# Patient Record
Sex: Male | Born: 1940
Health system: Southern US, Community
[De-identification: ages and names within clinical notes are randomized; demographics above are authoritative.]

## PROBLEM LIST (undated history)

## (undated) DIAGNOSIS — R0602 Shortness of breath: Secondary | ICD-10-CM

## (undated) DIAGNOSIS — J189 Pneumonia, unspecified organism: Secondary | ICD-10-CM

## (undated) DIAGNOSIS — C801 Malignant (primary) neoplasm, unspecified: Secondary | ICD-10-CM

## (undated) DIAGNOSIS — I2510 Atherosclerotic heart disease of native coronary artery without angina pectoris: Secondary | ICD-10-CM

## (undated) DIAGNOSIS — I10 Essential (primary) hypertension: Secondary | ICD-10-CM

## (undated) DIAGNOSIS — K219 Gastro-esophageal reflux disease without esophagitis: Secondary | ICD-10-CM

## (undated) DIAGNOSIS — N189 Chronic kidney disease, unspecified: Secondary | ICD-10-CM

## (undated) DIAGNOSIS — I509 Heart failure, unspecified: Secondary | ICD-10-CM

## (undated) DIAGNOSIS — M1990 Unspecified osteoarthritis, unspecified site: Secondary | ICD-10-CM

## (undated) DIAGNOSIS — D649 Anemia, unspecified: Secondary | ICD-10-CM

## (undated) DIAGNOSIS — R630 Anorexia: Secondary | ICD-10-CM

## (undated) DIAGNOSIS — E119 Type 2 diabetes mellitus without complications: Secondary | ICD-10-CM

## (undated) DIAGNOSIS — F0390 Unspecified dementia without behavioral disturbance: Secondary | ICD-10-CM

## (undated) DIAGNOSIS — R1310 Dysphagia, unspecified: Secondary | ICD-10-CM

## (undated) HISTORY — PX: APPENDECTOMY: SHX54

## (undated) HISTORY — DX: Type 2 diabetes mellitus without complications: E11.9

## (undated) HISTORY — DX: Anemia, unspecified: D64.9

## (undated) HISTORY — DX: Chronic kidney disease, unspecified: N18.9

---

## 2012-05-09 DIAGNOSIS — E1129 Type 2 diabetes mellitus with other diabetic kidney complication: Secondary | ICD-10-CM | POA: Diagnosis not present

## 2012-05-09 DIAGNOSIS — Z23 Encounter for immunization: Secondary | ICD-10-CM | POA: Diagnosis not present

## 2012-05-09 DIAGNOSIS — E785 Hyperlipidemia, unspecified: Secondary | ICD-10-CM | POA: Diagnosis not present

## 2012-05-09 DIAGNOSIS — E119 Type 2 diabetes mellitus without complications: Secondary | ICD-10-CM | POA: Diagnosis not present

## 2013-01-13 DIAGNOSIS — H6120 Impacted cerumen, unspecified ear: Secondary | ICD-10-CM | POA: Diagnosis not present

## 2013-01-13 DIAGNOSIS — E1129 Type 2 diabetes mellitus with other diabetic kidney complication: Secondary | ICD-10-CM | POA: Diagnosis not present

## 2013-01-13 DIAGNOSIS — E119 Type 2 diabetes mellitus without complications: Secondary | ICD-10-CM | POA: Diagnosis not present

## 2013-01-13 DIAGNOSIS — E785 Hyperlipidemia, unspecified: Secondary | ICD-10-CM | POA: Diagnosis not present

## 2013-05-30 DIAGNOSIS — R0609 Other forms of dyspnea: Secondary | ICD-10-CM | POA: Diagnosis not present

## 2013-05-30 DIAGNOSIS — I509 Heart failure, unspecified: Secondary | ICD-10-CM | POA: Diagnosis not present

## 2013-05-30 DIAGNOSIS — I10 Essential (primary) hypertension: Secondary | ICD-10-CM | POA: Diagnosis not present

## 2013-06-01 DIAGNOSIS — I509 Heart failure, unspecified: Secondary | ICD-10-CM | POA: Diagnosis not present

## 2013-06-08 DIAGNOSIS — E785 Hyperlipidemia, unspecified: Secondary | ICD-10-CM | POA: Diagnosis not present

## 2013-06-08 DIAGNOSIS — E119 Type 2 diabetes mellitus without complications: Secondary | ICD-10-CM | POA: Diagnosis not present

## 2013-06-08 DIAGNOSIS — R0602 Shortness of breath: Secondary | ICD-10-CM | POA: Diagnosis not present

## 2013-06-08 DIAGNOSIS — I10 Essential (primary) hypertension: Secondary | ICD-10-CM | POA: Diagnosis not present

## 2013-06-08 DIAGNOSIS — N189 Chronic kidney disease, unspecified: Secondary | ICD-10-CM | POA: Diagnosis not present

## 2013-06-19 DIAGNOSIS — I509 Heart failure, unspecified: Secondary | ICD-10-CM | POA: Diagnosis not present

## 2013-07-28 DIAGNOSIS — Z79899 Other long term (current) drug therapy: Secondary | ICD-10-CM | POA: Diagnosis not present

## 2013-07-28 DIAGNOSIS — N179 Acute kidney failure, unspecified: Secondary | ICD-10-CM | POA: Diagnosis not present

## 2013-07-28 DIAGNOSIS — D649 Anemia, unspecified: Secondary | ICD-10-CM | POA: Diagnosis not present

## 2013-07-28 DIAGNOSIS — Z23 Encounter for immunization: Secondary | ICD-10-CM | POA: Diagnosis not present

## 2013-07-28 DIAGNOSIS — R079 Chest pain, unspecified: Secondary | ICD-10-CM | POA: Diagnosis not present

## 2013-07-28 DIAGNOSIS — E785 Hyperlipidemia, unspecified: Secondary | ICD-10-CM | POA: Diagnosis not present

## 2013-07-28 DIAGNOSIS — IMO0001 Reserved for inherently not codable concepts without codable children: Secondary | ICD-10-CM | POA: Diagnosis not present

## 2013-07-28 DIAGNOSIS — Z794 Long term (current) use of insulin: Secondary | ICD-10-CM | POA: Diagnosis not present

## 2013-07-28 DIAGNOSIS — I10 Essential (primary) hypertension: Secondary | ICD-10-CM | POA: Diagnosis not present

## 2013-07-29 DIAGNOSIS — I10 Essential (primary) hypertension: Secondary | ICD-10-CM | POA: Diagnosis not present

## 2013-07-29 DIAGNOSIS — R0789 Other chest pain: Secondary | ICD-10-CM | POA: Diagnosis not present

## 2013-07-29 DIAGNOSIS — R079 Chest pain, unspecified: Secondary | ICD-10-CM | POA: Diagnosis not present

## 2013-07-29 DIAGNOSIS — N179 Acute kidney failure, unspecified: Secondary | ICD-10-CM | POA: Diagnosis not present

## 2013-07-29 DIAGNOSIS — IMO0001 Reserved for inherently not codable concepts without codable children: Secondary | ICD-10-CM | POA: Diagnosis not present

## 2013-07-29 DIAGNOSIS — R9431 Abnormal electrocardiogram [ECG] [EKG]: Secondary | ICD-10-CM | POA: Diagnosis not present

## 2013-08-02 DIAGNOSIS — N19 Unspecified kidney failure: Secondary | ICD-10-CM | POA: Diagnosis not present

## 2013-08-02 DIAGNOSIS — I517 Cardiomegaly: Secondary | ICD-10-CM | POA: Diagnosis not present

## 2013-08-02 DIAGNOSIS — N179 Acute kidney failure, unspecified: Secondary | ICD-10-CM | POA: Diagnosis not present

## 2013-08-02 DIAGNOSIS — I509 Heart failure, unspecified: Secondary | ICD-10-CM | POA: Diagnosis not present

## 2013-08-03 DIAGNOSIS — I5020 Unspecified systolic (congestive) heart failure: Secondary | ICD-10-CM | POA: Diagnosis not present

## 2013-08-03 DIAGNOSIS — I5030 Unspecified diastolic (congestive) heart failure: Secondary | ICD-10-CM | POA: Diagnosis not present

## 2013-08-03 DIAGNOSIS — N184 Chronic kidney disease, stage 4 (severe): Secondary | ICD-10-CM | POA: Diagnosis not present

## 2013-08-03 DIAGNOSIS — J449 Chronic obstructive pulmonary disease, unspecified: Secondary | ICD-10-CM | POA: Diagnosis not present

## 2013-08-03 DIAGNOSIS — IMO0001 Reserved for inherently not codable concepts without codable children: Secondary | ICD-10-CM | POA: Diagnosis not present

## 2013-08-04 DIAGNOSIS — I10 Essential (primary) hypertension: Secondary | ICD-10-CM | POA: Diagnosis not present

## 2013-08-04 DIAGNOSIS — E119 Type 2 diabetes mellitus without complications: Secondary | ICD-10-CM | POA: Diagnosis not present

## 2013-08-08 DIAGNOSIS — Z1389 Encounter for screening for other disorder: Secondary | ICD-10-CM | POA: Diagnosis not present

## 2013-08-08 DIAGNOSIS — R079 Chest pain, unspecified: Secondary | ICD-10-CM | POA: Diagnosis not present

## 2013-08-08 DIAGNOSIS — R0989 Other specified symptoms and signs involving the circulatory and respiratory systems: Secondary | ICD-10-CM | POA: Diagnosis not present

## 2013-08-08 DIAGNOSIS — N19 Unspecified kidney failure: Secondary | ICD-10-CM | POA: Diagnosis not present

## 2013-08-11 DIAGNOSIS — IMO0002 Reserved for concepts with insufficient information to code with codable children: Secondary | ICD-10-CM | POA: Diagnosis not present

## 2013-08-11 DIAGNOSIS — Z6827 Body mass index (BMI) 27.0-27.9, adult: Secondary | ICD-10-CM | POA: Diagnosis not present

## 2013-08-11 DIAGNOSIS — R059 Cough, unspecified: Secondary | ICD-10-CM | POA: Diagnosis not present

## 2013-08-11 DIAGNOSIS — R05 Cough: Secondary | ICD-10-CM | POA: Diagnosis not present

## 2013-08-11 DIAGNOSIS — E1165 Type 2 diabetes mellitus with hyperglycemia: Secondary | ICD-10-CM | POA: Diagnosis not present

## 2013-08-13 DIAGNOSIS — Z8739 Personal history of other diseases of the musculoskeletal system and connective tissue: Secondary | ICD-10-CM | POA: Diagnosis not present

## 2013-08-13 DIAGNOSIS — R5381 Other malaise: Secondary | ICD-10-CM | POA: Diagnosis not present

## 2013-08-13 DIAGNOSIS — D638 Anemia in other chronic diseases classified elsewhere: Secondary | ICD-10-CM | POA: Diagnosis not present

## 2013-08-13 DIAGNOSIS — J9600 Acute respiratory failure, unspecified whether with hypoxia or hypercapnia: Secondary | ICD-10-CM | POA: Diagnosis not present

## 2013-08-13 DIAGNOSIS — K219 Gastro-esophageal reflux disease without esophagitis: Secondary | ICD-10-CM | POA: Diagnosis not present

## 2013-08-13 DIAGNOSIS — J984 Other disorders of lung: Secondary | ICD-10-CM | POA: Diagnosis not present

## 2013-08-13 DIAGNOSIS — M6281 Muscle weakness (generalized): Secondary | ICD-10-CM | POA: Diagnosis not present

## 2013-08-13 DIAGNOSIS — IMO0001 Reserved for inherently not codable concepts without codable children: Secondary | ICD-10-CM | POA: Diagnosis not present

## 2013-08-13 DIAGNOSIS — R0602 Shortness of breath: Secondary | ICD-10-CM | POA: Diagnosis not present

## 2013-08-13 DIAGNOSIS — Z23 Encounter for immunization: Secondary | ICD-10-CM | POA: Diagnosis not present

## 2013-08-13 DIAGNOSIS — D649 Anemia, unspecified: Secondary | ICD-10-CM | POA: Diagnosis present

## 2013-08-13 DIAGNOSIS — M6250 Muscle wasting and atrophy, not elsewhere classified, unspecified site: Secondary | ICD-10-CM | POA: Diagnosis not present

## 2013-08-13 DIAGNOSIS — E119 Type 2 diabetes mellitus without complications: Secondary | ICD-10-CM | POA: Diagnosis not present

## 2013-08-13 DIAGNOSIS — I509 Heart failure, unspecified: Secondary | ICD-10-CM | POA: Diagnosis not present

## 2013-08-13 DIAGNOSIS — I4891 Unspecified atrial fibrillation: Secondary | ICD-10-CM | POA: Diagnosis not present

## 2013-08-13 DIAGNOSIS — I5021 Acute systolic (congestive) heart failure: Secondary | ICD-10-CM | POA: Diagnosis not present

## 2013-08-13 DIAGNOSIS — I129 Hypertensive chronic kidney disease with stage 1 through stage 4 chronic kidney disease, or unspecified chronic kidney disease: Secondary | ICD-10-CM | POA: Diagnosis present

## 2013-08-13 DIAGNOSIS — Z7982 Long term (current) use of aspirin: Secondary | ICD-10-CM | POA: Diagnosis not present

## 2013-08-13 DIAGNOSIS — Z79899 Other long term (current) drug therapy: Secondary | ICD-10-CM | POA: Diagnosis not present

## 2013-08-13 DIAGNOSIS — N189 Chronic kidney disease, unspecified: Secondary | ICD-10-CM | POA: Diagnosis not present

## 2013-08-13 DIAGNOSIS — J189 Pneumonia, unspecified organism: Secondary | ICD-10-CM | POA: Diagnosis not present

## 2013-08-13 DIAGNOSIS — I10 Essential (primary) hypertension: Secondary | ICD-10-CM | POA: Diagnosis not present

## 2013-08-13 DIAGNOSIS — E7800 Pure hypercholesterolemia, unspecified: Secondary | ICD-10-CM | POA: Diagnosis not present

## 2013-08-13 DIAGNOSIS — N19 Unspecified kidney failure: Secondary | ICD-10-CM | POA: Diagnosis not present

## 2013-08-13 DIAGNOSIS — N179 Acute kidney failure, unspecified: Secondary | ICD-10-CM | POA: Diagnosis present

## 2013-08-13 DIAGNOSIS — R262 Difficulty in walking, not elsewhere classified: Secondary | ICD-10-CM | POA: Diagnosis not present

## 2013-08-13 DIAGNOSIS — R0902 Hypoxemia: Secondary | ICD-10-CM | POA: Diagnosis not present

## 2013-08-13 DIAGNOSIS — Z794 Long term (current) use of insulin: Secondary | ICD-10-CM | POA: Diagnosis not present

## 2013-08-13 DIAGNOSIS — I5023 Acute on chronic systolic (congestive) heart failure: Secondary | ICD-10-CM | POA: Diagnosis not present

## 2013-08-17 DIAGNOSIS — I129 Hypertensive chronic kidney disease with stage 1 through stage 4 chronic kidney disease, or unspecified chronic kidney disease: Secondary | ICD-10-CM | POA: Diagnosis not present

## 2013-08-17 DIAGNOSIS — N179 Acute kidney failure, unspecified: Secondary | ICD-10-CM | POA: Diagnosis not present

## 2013-08-17 DIAGNOSIS — N19 Unspecified kidney failure: Secondary | ICD-10-CM | POA: Diagnosis not present

## 2013-08-17 DIAGNOSIS — J9600 Acute respiratory failure, unspecified whether with hypoxia or hypercapnia: Secondary | ICD-10-CM | POA: Diagnosis not present

## 2013-08-17 DIAGNOSIS — Z01818 Encounter for other preprocedural examination: Secondary | ICD-10-CM | POA: Diagnosis not present

## 2013-08-17 DIAGNOSIS — J9819 Other pulmonary collapse: Secondary | ICD-10-CM | POA: Diagnosis not present

## 2013-08-17 DIAGNOSIS — N184 Chronic kidney disease, stage 4 (severe): Secondary | ICD-10-CM | POA: Diagnosis not present

## 2013-08-17 DIAGNOSIS — Z01810 Encounter for preprocedural cardiovascular examination: Secondary | ICD-10-CM | POA: Diagnosis not present

## 2013-08-17 DIAGNOSIS — R262 Difficulty in walking, not elsewhere classified: Secondary | ICD-10-CM | POA: Diagnosis not present

## 2013-08-17 DIAGNOSIS — Z794 Long term (current) use of insulin: Secondary | ICD-10-CM | POA: Diagnosis not present

## 2013-08-17 DIAGNOSIS — M6281 Muscle weakness (generalized): Secondary | ICD-10-CM | POA: Diagnosis not present

## 2013-08-17 DIAGNOSIS — E1165 Type 2 diabetes mellitus with hyperglycemia: Secondary | ICD-10-CM | POA: Diagnosis not present

## 2013-08-17 DIAGNOSIS — D631 Anemia in chronic kidney disease: Secondary | ICD-10-CM | POA: Diagnosis not present

## 2013-08-17 DIAGNOSIS — M6250 Muscle wasting and atrophy, not elsewhere classified, unspecified site: Secondary | ICD-10-CM | POA: Diagnosis not present

## 2013-08-17 DIAGNOSIS — IMO0001 Reserved for inherently not codable concepts without codable children: Secondary | ICD-10-CM | POA: Diagnosis not present

## 2013-08-17 DIAGNOSIS — I517 Cardiomegaly: Secondary | ICD-10-CM | POA: Diagnosis not present

## 2013-08-17 DIAGNOSIS — E7800 Pure hypercholesterolemia, unspecified: Secondary | ICD-10-CM | POA: Diagnosis not present

## 2013-08-17 DIAGNOSIS — Z8739 Personal history of other diseases of the musculoskeletal system and connective tissue: Secondary | ICD-10-CM | POA: Diagnosis not present

## 2013-08-17 DIAGNOSIS — K219 Gastro-esophageal reflux disease without esophagitis: Secondary | ICD-10-CM | POA: Diagnosis not present

## 2013-08-17 DIAGNOSIS — I10 Essential (primary) hypertension: Secondary | ICD-10-CM | POA: Diagnosis not present

## 2013-08-17 DIAGNOSIS — D649 Anemia, unspecified: Secondary | ICD-10-CM | POA: Diagnosis not present

## 2013-08-17 DIAGNOSIS — I5021 Acute systolic (congestive) heart failure: Secondary | ICD-10-CM | POA: Diagnosis not present

## 2013-08-17 DIAGNOSIS — N189 Chronic kidney disease, unspecified: Secondary | ICD-10-CM | POA: Diagnosis not present

## 2013-08-17 DIAGNOSIS — E119 Type 2 diabetes mellitus without complications: Secondary | ICD-10-CM | POA: Diagnosis not present

## 2013-08-17 DIAGNOSIS — N039 Chronic nephritic syndrome with unspecified morphologic changes: Secondary | ICD-10-CM | POA: Diagnosis not present

## 2013-08-17 DIAGNOSIS — I82719 Chronic embolism and thrombosis of superficial veins of unspecified upper extremity: Secondary | ICD-10-CM | POA: Diagnosis not present

## 2013-08-17 DIAGNOSIS — Z23 Encounter for immunization: Secondary | ICD-10-CM | POA: Diagnosis not present

## 2013-08-17 DIAGNOSIS — N181 Chronic kidney disease, stage 1: Secondary | ICD-10-CM | POA: Diagnosis not present

## 2013-08-17 DIAGNOSIS — IMO0002 Reserved for concepts with insufficient information to code with codable children: Secondary | ICD-10-CM | POA: Diagnosis not present

## 2013-08-17 DIAGNOSIS — J189 Pneumonia, unspecified organism: Secondary | ICD-10-CM | POA: Diagnosis not present

## 2013-08-17 DIAGNOSIS — I5023 Acute on chronic systolic (congestive) heart failure: Secondary | ICD-10-CM | POA: Diagnosis not present

## 2013-08-17 DIAGNOSIS — R5381 Other malaise: Secondary | ICD-10-CM | POA: Diagnosis not present

## 2013-08-17 DIAGNOSIS — I509 Heart failure, unspecified: Secondary | ICD-10-CM | POA: Diagnosis not present

## 2013-08-17 DIAGNOSIS — I4891 Unspecified atrial fibrillation: Secondary | ICD-10-CM | POA: Diagnosis not present

## 2013-08-17 DIAGNOSIS — Z7982 Long term (current) use of aspirin: Secondary | ICD-10-CM | POA: Diagnosis not present

## 2013-08-17 DIAGNOSIS — Z79899 Other long term (current) drug therapy: Secondary | ICD-10-CM | POA: Diagnosis not present

## 2013-08-17 DIAGNOSIS — Z87891 Personal history of nicotine dependence: Secondary | ICD-10-CM | POA: Diagnosis not present

## 2013-08-18 DIAGNOSIS — D631 Anemia in chronic kidney disease: Secondary | ICD-10-CM | POA: Diagnosis not present

## 2013-08-18 DIAGNOSIS — N179 Acute kidney failure, unspecified: Secondary | ICD-10-CM | POA: Diagnosis not present

## 2013-08-18 DIAGNOSIS — N184 Chronic kidney disease, stage 4 (severe): Secondary | ICD-10-CM | POA: Diagnosis not present

## 2013-08-18 DIAGNOSIS — E119 Type 2 diabetes mellitus without complications: Secondary | ICD-10-CM | POA: Diagnosis not present

## 2013-08-21 DIAGNOSIS — E119 Type 2 diabetes mellitus without complications: Secondary | ICD-10-CM | POA: Diagnosis not present

## 2013-08-21 DIAGNOSIS — I10 Essential (primary) hypertension: Secondary | ICD-10-CM | POA: Diagnosis not present

## 2013-08-22 DIAGNOSIS — N179 Acute kidney failure, unspecified: Secondary | ICD-10-CM | POA: Diagnosis not present

## 2013-08-22 DIAGNOSIS — E119 Type 2 diabetes mellitus without complications: Secondary | ICD-10-CM | POA: Diagnosis not present

## 2013-08-22 DIAGNOSIS — N184 Chronic kidney disease, stage 4 (severe): Secondary | ICD-10-CM | POA: Diagnosis not present

## 2013-08-22 DIAGNOSIS — D631 Anemia in chronic kidney disease: Secondary | ICD-10-CM | POA: Diagnosis not present

## 2013-08-25 DIAGNOSIS — I10 Essential (primary) hypertension: Secondary | ICD-10-CM | POA: Diagnosis not present

## 2013-08-25 DIAGNOSIS — E1165 Type 2 diabetes mellitus with hyperglycemia: Secondary | ICD-10-CM | POA: Diagnosis not present

## 2013-08-25 DIAGNOSIS — N181 Chronic kidney disease, stage 1: Secondary | ICD-10-CM | POA: Diagnosis not present

## 2013-08-25 DIAGNOSIS — R5381 Other malaise: Secondary | ICD-10-CM | POA: Diagnosis not present

## 2013-08-25 DIAGNOSIS — IMO0002 Reserved for concepts with insufficient information to code with codable children: Secondary | ICD-10-CM | POA: Diagnosis not present

## 2013-08-28 ENCOUNTER — Ambulatory Visit: Payer: Medicare Other | Admitting: Vascular Surgery

## 2013-08-28 ENCOUNTER — Encounter (HOSPITAL_COMMUNITY): Payer: Medicare Other

## 2013-08-29 ENCOUNTER — Other Ambulatory Visit: Payer: Self-pay | Admitting: *Deleted

## 2013-08-29 ENCOUNTER — Encounter: Payer: Self-pay | Admitting: Vascular Surgery

## 2013-08-29 DIAGNOSIS — N184 Chronic kidney disease, stage 4 (severe): Secondary | ICD-10-CM

## 2013-08-29 DIAGNOSIS — Z01810 Encounter for preprocedural cardiovascular examination: Secondary | ICD-10-CM

## 2013-08-29 DIAGNOSIS — D631 Anemia in chronic kidney disease: Secondary | ICD-10-CM | POA: Diagnosis not present

## 2013-08-29 DIAGNOSIS — N179 Acute kidney failure, unspecified: Secondary | ICD-10-CM | POA: Diagnosis not present

## 2013-08-31 ENCOUNTER — Encounter: Payer: Self-pay | Admitting: Vascular Surgery

## 2013-09-01 ENCOUNTER — Ambulatory Visit (HOSPITAL_COMMUNITY)
Admission: RE | Admit: 2013-09-01 | Discharge: 2013-09-01 | Disposition: A | Discharge status: Home / Self Care | Payer: No Typology Code available for payment source | Source: Ambulatory Visit | Attending: Vascular Surgery | Admitting: Vascular Surgery

## 2013-09-01 ENCOUNTER — Encounter: Payer: Self-pay | Admitting: Vascular Surgery

## 2013-09-01 ENCOUNTER — Ambulatory Visit (INDEPENDENT_AMBULATORY_CARE_PROVIDER_SITE_OTHER)
Admission: RE | Admit: 2013-09-01 | Discharge: 2013-09-01 | Disposition: A | Discharge status: Home / Self Care | Payer: No Typology Code available for payment source | Source: Ambulatory Visit | Attending: Vascular Surgery | Admitting: Vascular Surgery

## 2013-09-01 ENCOUNTER — Ambulatory Visit (INDEPENDENT_AMBULATORY_CARE_PROVIDER_SITE_OTHER): Payer: Medicare Other | Admitting: Vascular Surgery

## 2013-09-01 VITALS — BP 147/66 | HR 82 | Wt 183.0 lb

## 2013-09-01 DIAGNOSIS — Z01810 Encounter for preprocedural cardiovascular examination: Secondary | ICD-10-CM | POA: Diagnosis not present

## 2013-09-01 DIAGNOSIS — N185 Chronic kidney disease, stage 5: Secondary | ICD-10-CM | POA: Insufficient documentation

## 2013-09-01 DIAGNOSIS — N184 Chronic kidney disease, stage 4 (severe): Secondary | ICD-10-CM

## 2013-09-01 DIAGNOSIS — I82719 Chronic embolism and thrombosis of superficial veins of unspecified upper extremity: Secondary | ICD-10-CM | POA: Insufficient documentation

## 2013-09-01 NOTE — Progress Notes (Addendum)
Referred by:  Marlena Clipper, NP [address] [place], [place] [address]  Reason for referral: New access  History of Present Illness  Lucas Calderon is a 73 y.o. (Oct 10, 1940) male who presents for evaluation for permanent access.  The patient is right hand dominant.  The patient has not had previous access procedures.  Previous central venous cannulation procedures include: none.  The patient has never had a PPM placed.   Past Medical History  Diagnosis Date  . Anemia   . Diabetes mellitus without complication   . Chronic kidney disease     Past Surgical History  Procedure Laterality Date  . Appendectomy      History   Social History  . Marital Status: Unknown    Spouse Name: N/A    Number of Children: N/A  . Years of Education: N/A   Occupational History  . Not on file.   Social History Main Topics  . Smoking status: Former Research scientist (life sciences)  . Smokeless tobacco: Not on file  . Alcohol Use: No  . Drug Use: No  . Sexual Activity: Not on file   Other Topics Concern  . Not on file   Social History Narrative  . No narrative on file    Family History  Problem Relation Age of Onset  . Diabetes Mother   . Heart disease Mother   . Hypertension Mother   . Diabetes Father   . Hypertension Father   . Peripheral vascular disease Father   . Hypertension Sister    Current Outpatient Prescriptions  Medication Sig Dispense Refill  . albuterol (PROVENTIL HFA) 108 (90 BASE) MCG/ACT inhaler Inhale 2 puffs into the lungs every 6 (six) hours as needed for wheezing or shortness of breath.      Marland Kitchen amLODipine (NORVASC) 10 MG tablet Take 10 mg by mouth daily.      Marland Kitchen aspirin 81 MG tablet Take 81 mg by mouth daily.      Marland Kitchen b complex vitamins tablet Take 1 tablet by mouth daily.      . carvedilol (COREG) 12.5 MG tablet Take 12.5 mg by mouth 2 (two) times daily with a meal.      . HYDRALAZINE HCL PO Take 75 mg by mouth 4 (four) times daily.      . Insulin Glargine (LANTUS ) Inject  20 Units into the skin daily.      . isosorbide mononitrate (IMDUR) 60 MG 24 hr tablet Take 60 mg by mouth daily.      Marland Kitchen torsemide (DEMADEX) 20 MG tablet Take 20 mg by mouth daily.      . furosemide (LASIX) 20 MG tablet       . LANTUS SOLOSTAR 100 UNIT/ML Solostar Pen       . losartan-hydrochlorothiazide (HYZAAR) 100-12.5 MG per tablet       . metFORMIN (GLUCOPHAGE-XR) 500 MG 24 hr tablet       . metoprolol succinate (TOPROL-XL) 50 MG 24 hr tablet        No current facility-administered medications for this visit.     Allergies  Allergen Reactions  . Penicillins    REVIEW OF SYSTEMS:  (Positives checked otherwise negative)  CARDIOVASCULAR:  []  chest pain, []  chest pressure, []  palpitations, []  shortness of breath when laying flat, [x]  shortness of breath with exertion,  []  pain in feet when walking, []  pain in feet when laying flat, []  history of blood clot in veins (DVT), []  history of phlebitis, [x]  swelling in  legs, []  varicose veins  PULMONARY:  []  productive cough, []  asthma, []  wheezing  NEUROLOGIC:  []  weakness in arms or legs, []  numbness in arms or legs, []  difficulty speaking or slurred speech, []  temporary loss of vision in one eye, []  dizziness  HEMATOLOGIC:  []  bleeding problems, []  problems with blood clotting too easily  MUSCULOSKEL:  []  joint pain, []  joint swelling  GASTROINTEST:  []  vomiting blood, []  blood in stool     GENITOURINARY:  []  burning with urination, []  blood in urine  PSYCHIATRIC:  []  history of major depression  INTEGUMENTARY:  []  rashes, []  ulcers  CONSTITUTIONAL:  []  fever, []  chills   Physical Examination  Filed Vitals:   09/01/13 1521  BP: 147/66  Pulse: 82  Weight: 183 lb (83.008 kg)  SpO2: 100%   There is no height on file to calculate BMI.  General: A&O x 3, WDWN, happy  Head: [place]/AT  Ear/Nose/Throat: Hearing grossly intact, nares w/o erythema or drainage, oropharynx w/o Erythema/Exudate, Mallampati score: 3  Eyes: PERRLA,  EOMI  Neck: Supple, no nuchal rigidity, no palpable LAD  Pulmonary: Sym exp, good air movt, CTAB, no rales, rhonchi, & wheezing  Cardiac: RRR, Nl S1, S2, no Murmurs, rubs or gallops  Vascular: Vessel Right Left  Radial Palpable Palpable  Brachial Palpable Palpable  Carotid Palpable, without bruit Palpable, without bruit  Aorta Not palpable N/A  Femoral Palpable Palpable  Popliteal Not palpable Not palpable  PT Not Palpable Not Palpable  DP Not Palpable Not Palpable   Gastrointestinal: soft, NTND, -G/R, - HSM, - masses, - CVAT B  Musculoskeletal: M/S 5/5 throughout , Extremities without ischemic changes , 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 with well healed skin  Neurologic: CN grossly intact, Pain and light touch intact in extremities , Motor exam as listed above  Psychiatric: Judgment intact, Mood & affect appropriate for pt's clinical situation, some elements of cognitive dysfunction  Dermatologic: See M/S exam for extremity exam, no rashes otherwise noted  Lymph : No Cervical, Axillary, or Inguinal lymphadenopathy   Non-Invasive Vascular Imaging  Vein Mapping  (Date: 09/01/2013):   R arm: acceptable vein conduits include entire cephalic   L arm: acceptable vein conduits include entire cephalic and upper arm basilic  BUE Arterial doppler (09/01/2013)  Triphasic throughout bilaterally   Medical Decision Making  Lucas Calderon is a 73 y.o. 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  Unfortunately, no labwork available.  Based on vein mapping and examination, this patient's permanent access options include: R RC vs BC AVF, L RC vs BC AVF, L staged BVT.  I would start wth L RC vs BC AVF.  Will obtain BMP on day of surgery to confirm severity of patient's CKD.  I had an extensive discussion with this patient in regards to the nature of access  surgery, including risk, benefits, and alternatives.    The patient is aware that the risks of access surgery include but are not limited to: bleeding, infection, steal syndrome, nerve damage, ischemic monomelic neuropathy, failure of access to mature, and possible need for additional access procedures in the future.  The patient has agreed to proceed with the above procedure which will be scheduled 23 FEB 15.  Adele Barthel, MD [facility] Office: [phone] Pager: [phone]  09/01/2013, 3:54 PM

## 2013-09-01 NOTE — Addendum Note (Signed)
Addended by: Conrad [place] on: 09/01/2013 04:01 PM   Modules accepted: Medications

## 2013-09-05 ENCOUNTER — Other Ambulatory Visit: Payer: Self-pay | Admitting: *Deleted

## 2013-09-08 ENCOUNTER — Encounter (HOSPITAL_COMMUNITY): Payer: Self-pay | Admitting: *Deleted

## 2013-09-08 NOTE — Progress Notes (Signed)
Spoke with Sharyn Lull, pt nurse at [facility] regarding pt SDW Call. According to [facility] ( nurse), she could only provide medical history.  Anesthesia, surgical, Apnea and diagnostic study questions should be directed to the family members that will accompany pt on the DOS. Michelle ( nurse) did state that pt hemoglobin was 8.7 and pt was started on PO iron BID. Spoke with Arbie Cookey ( Dr. Lianne Moris nurse) to make aware of abnormal lab result.

## 2013-09-10 MED ORDER — VANCOMYCIN HCL IN DEXTROSE 1-5 GM/200ML-% IV SOLN
1000.0000 mg | INTRAVENOUS | Status: AC
  Administered 2013-09-11: 1000 mg via INTRAVENOUS
  Filled 2013-09-10: qty 200

## 2013-09-11 ENCOUNTER — Encounter (HOSPITAL_COMMUNITY): Payer: Self-pay | Admitting: *Deleted

## 2013-09-11 ENCOUNTER — Encounter (HOSPITAL_COMMUNITY): Payer: Medicare Other | Admitting: Anesthesiology

## 2013-09-11 ENCOUNTER — Telehealth: Payer: Self-pay | Admitting: Vascular Surgery

## 2013-09-11 ENCOUNTER — Other Ambulatory Visit: Payer: Self-pay | Admitting: *Deleted

## 2013-09-11 ENCOUNTER — Ambulatory Visit (HOSPITAL_COMMUNITY): Payer: Medicare Other

## 2013-09-11 ENCOUNTER — Ambulatory Visit (HOSPITAL_COMMUNITY): Payer: Medicare Other | Admitting: Anesthesiology

## 2013-09-11 ENCOUNTER — Encounter (HOSPITAL_COMMUNITY)
Admission: RE | Disposition: A | Discharge status: Home / Self Care | Payer: Self-pay | Source: Ambulatory Visit | Attending: Vascular Surgery

## 2013-09-11 ENCOUNTER — Ambulatory Visit (HOSPITAL_COMMUNITY)
Admission: RE | Admit: 2013-09-11 | Discharge: 2013-09-11 | Disposition: A | Discharge status: Home / Self Care | Payer: Medicare Other | Source: Ambulatory Visit | Attending: Vascular Surgery | Admitting: Vascular Surgery

## 2013-09-11 DIAGNOSIS — Z87891 Personal history of nicotine dependence: Secondary | ICD-10-CM | POA: Insufficient documentation

## 2013-09-11 DIAGNOSIS — K219 Gastro-esophageal reflux disease without esophagitis: Secondary | ICD-10-CM | POA: Diagnosis not present

## 2013-09-11 DIAGNOSIS — N186 End stage renal disease: Secondary | ICD-10-CM

## 2013-09-11 DIAGNOSIS — E119 Type 2 diabetes mellitus without complications: Secondary | ICD-10-CM | POA: Insufficient documentation

## 2013-09-11 DIAGNOSIS — Z4931 Encounter for adequacy testing for hemodialysis: Secondary | ICD-10-CM

## 2013-09-11 DIAGNOSIS — I129 Hypertensive chronic kidney disease with stage 1 through stage 4 chronic kidney disease, or unspecified chronic kidney disease: Secondary | ICD-10-CM | POA: Diagnosis not present

## 2013-09-11 DIAGNOSIS — I10 Essential (primary) hypertension: Secondary | ICD-10-CM | POA: Diagnosis not present

## 2013-09-11 DIAGNOSIS — N184 Chronic kidney disease, stage 4 (severe): Secondary | ICD-10-CM | POA: Diagnosis not present

## 2013-09-11 DIAGNOSIS — Z01818 Encounter for other preprocedural examination: Secondary | ICD-10-CM | POA: Insufficient documentation

## 2013-09-11 DIAGNOSIS — D649 Anemia, unspecified: Secondary | ICD-10-CM | POA: Diagnosis not present

## 2013-09-11 DIAGNOSIS — J9819 Other pulmonary collapse: Secondary | ICD-10-CM | POA: Diagnosis not present

## 2013-09-11 DIAGNOSIS — I517 Cardiomegaly: Secondary | ICD-10-CM | POA: Insufficient documentation

## 2013-09-11 HISTORY — DX: Essential (primary) hypertension: I10

## 2013-09-11 HISTORY — DX: Gastro-esophageal reflux disease without esophagitis: K21.9

## 2013-09-11 HISTORY — PX: AV FISTULA PLACEMENT: SHX1204

## 2013-09-11 HISTORY — DX: Pneumonia, unspecified organism: J18.9

## 2013-09-11 LAB — BASIC METABOLIC PANEL
BUN: 67 mg/dL — ABNORMAL HIGH (ref 6–23)
CO2: 20 meq/L (ref 19–32)
CREATININE: 4.61 mg/dL — AB (ref 0.50–1.35)
Calcium: 8.9 mg/dL (ref 8.4–10.5)
Chloride: 106 mEq/L (ref 96–112)
GFR calc Af Amer: 14 mL/min — ABNORMAL LOW (ref >90)
GFR calc non Af Amer: 12 mL/min — ABNORMAL LOW (ref >90)
Glucose, Bld: 89 mg/dL (ref 70–99)
Potassium: 4.7 mEq/L (ref 3.7–5.3)
SODIUM: 141 meq/L (ref 137–147)

## 2013-09-11 LAB — CBC
HCT: 25 % — ABNORMAL LOW (ref 39.0–52.0)
Hemoglobin: 8.4 g/dL — ABNORMAL LOW (ref 13.0–17.0)
MCH: 27.9 pg (ref 26.0–34.0)
MCHC: 33.6 g/dL (ref 30.0–36.0)
MCV: 83.1 fL (ref 78.0–100.0)
Platelets: 206 10*3/uL (ref 150–400)
RBC: 3.01 MIL/uL — ABNORMAL LOW (ref 4.22–5.81)
RDW: 14.7 % (ref 11.5–15.5)
WBC: 9.7 10*3/uL (ref 4.0–10.5)

## 2013-09-11 LAB — GLUCOSE, CAPILLARY
GLUCOSE-CAPILLARY: 93 mg/dL (ref 70–99)
GLUCOSE-CAPILLARY: 93 mg/dL (ref 70–99)

## 2013-09-11 SURGERY — ARTERIOVENOUS (AV) FISTULA CREATION
Anesthesia: Monitor Anesthesia Care | Site: Arm Lower | Laterality: Left

## 2013-09-11 MED ORDER — MIDAZOLAM HCL 2 MG/2ML IJ SOLN
INTRAMUSCULAR | Status: AC
  Filled 2013-09-11: qty 2

## 2013-09-11 MED ORDER — PROPOFOL 10 MG/ML IV BOLUS
INTRAVENOUS | Status: AC
  Filled 2013-09-11: qty 20

## 2013-09-11 MED ORDER — CHLORHEXIDINE GLUCONATE CLOTH 2 % EX PADS: 6.0000 | MEDICATED_PAD | Freq: Once | CUTANEOUS | Status: DC

## 2013-09-11 MED ORDER — PROPOFOL INFUSION 10 MG/ML OPTIME
INTRAVENOUS | Status: DC | PRN
  Administered 2013-09-11: 25 ug/kg/min via INTRAVENOUS

## 2013-09-11 MED ORDER — THROMBIN 20000 UNITS EX SOLR
CUTANEOUS | Status: AC
  Filled 2013-09-11: qty 20000

## 2013-09-11 MED ORDER — SODIUM CHLORIDE 0.9 % IV SOLN: INTRAVENOUS | Status: DC

## 2013-09-11 MED ORDER — FENTANYL CITRATE 0.05 MG/ML IJ SOLN
INTRAMUSCULAR | Status: AC
  Filled 2013-09-11: qty 5

## 2013-09-11 MED ORDER — 0.9 % SODIUM CHLORIDE (POUR BTL) OPTIME
TOPICAL | Status: DC | PRN
  Administered 2013-09-11: 1000 mL

## 2013-09-11 MED ORDER — CHLORHEXIDINE GLUCONATE CLOTH 2 % EX PADS
6.0000 | MEDICATED_PAD | Freq: Once | CUTANEOUS | Status: DC
Start: 2013-09-11 — End: 2013-09-11

## 2013-09-11 MED ORDER — SODIUM CHLORIDE 0.9 % IR SOLN
Status: DC | PRN
  Administered 2013-09-11: 07:00:00

## 2013-09-11 MED ORDER — FENTANYL CITRATE 0.05 MG/ML IJ SOLN
INTRAMUSCULAR | Status: DC | PRN
  Administered 2013-09-11: 50 ug via INTRAVENOUS
  Administered 2013-09-11: 25 ug via INTRAVENOUS
  Administered 2013-09-11: 50 ug via INTRAVENOUS

## 2013-09-11 MED ORDER — BUPIVACAINE HCL (PF) 0.5 % IJ SOLN
INTRAMUSCULAR | Status: DC | PRN
Start: 2013-09-11 — End: 2013-09-11
  Administered 2013-09-11: 30 mL

## 2013-09-11 MED ORDER — OXYCODONE-ACETAMINOPHEN 5-325 MG PO TABS: 1.0000 | ORAL_TABLET | Freq: Four times a day (QID) | ORAL | Status: DC | PRN

## 2013-09-11 MED ORDER — ONDANSETRON HCL 4 MG/2ML IJ SOLN
INTRAMUSCULAR | Status: DC | PRN
  Administered 2013-09-11: 4 mg via INTRAVENOUS

## 2013-09-11 MED ORDER — LIDOCAINE-EPINEPHRINE (PF) 1 %-1:200000 IJ SOLN
INTRAMUSCULAR | Status: DC | PRN
  Administered 2013-09-11: 30 mL

## 2013-09-11 MED ORDER — SODIUM CHLORIDE 0.9 % IV SOLN
INTRAVENOUS | Status: DC | PRN
  Administered 2013-09-11 (×2): via INTRAVENOUS

## 2013-09-11 SURGICAL SUPPLY — 39 items
ARMBAND PINK RESTRICT EXTREMIT (MISCELLANEOUS) ×3 IMPLANT
CANISTER SUCTION 2500CC (MISCELLANEOUS) ×3 IMPLANT
CLIP TI MEDIUM 6 (CLIP) ×6 IMPLANT
CLIP TI WIDE RED SMALL 6 (CLIP) ×6 IMPLANT
COVER PROBE W GEL 5X96 (DRAPES) IMPLANT
COVER SURGICAL LIGHT HANDLE (MISCELLANEOUS) ×3 IMPLANT
DECANTER SPIKE VIAL GLASS SM (MISCELLANEOUS) ×3 IMPLANT
DERMABOND ADHESIVE PROPEN (GAUZE/BANDAGES/DRESSINGS) ×2
DERMABOND ADVANCED (GAUZE/BANDAGES/DRESSINGS) ×2
DERMABOND ADVANCED .7 DNX12 (GAUZE/BANDAGES/DRESSINGS) ×1 IMPLANT
DERMABOND ADVANCED .7 DNX6 (GAUZE/BANDAGES/DRESSINGS) ×1 IMPLANT
ELECT REM PT RETURN 9FT ADLT (ELECTROSURGICAL) ×3
ELECTRODE REM PT RTRN 9FT ADLT (ELECTROSURGICAL) ×1 IMPLANT
GLOVE BIO SURGEON STRL SZ7 (GLOVE) ×3 IMPLANT
GLOVE BIOGEL PI IND STRL 6.5 (GLOVE) ×1 IMPLANT
GLOVE BIOGEL PI IND STRL 7.0 (GLOVE) ×1 IMPLANT
GLOVE BIOGEL PI IND STRL 7.5 (GLOVE) ×2 IMPLANT
GLOVE BIOGEL PI INDICATOR 6.5 (GLOVE) ×2
GLOVE BIOGEL PI INDICATOR 7.0 (GLOVE) ×2
GLOVE BIOGEL PI INDICATOR 7.5 (GLOVE) ×4
GLOVE SKINSENSE NS SZ7.0 (GLOVE) ×6
GLOVE SKINSENSE STRL SZ7.0 (GLOVE) ×3 IMPLANT
GOWN STRL REUS W/ TWL LRG LVL3 (GOWN DISPOSABLE) ×3 IMPLANT
GOWN STRL REUS W/TWL LRG LVL3 (GOWN DISPOSABLE) ×6
KIT BASIN OR (CUSTOM PROCEDURE TRAY) ×3 IMPLANT
KIT ROOM TURNOVER OR (KITS) ×3 IMPLANT
NS IRRIG 1000ML POUR BTL (IV SOLUTION) ×3 IMPLANT
PACK CV ACCESS (CUSTOM PROCEDURE TRAY) ×3 IMPLANT
PAD ARMBOARD 7.5X6 YLW CONV (MISCELLANEOUS) ×6 IMPLANT
SPONGE SURGIFOAM ABS GEL 100 (HEMOSTASIS) IMPLANT
SUT MNCRL AB 4-0 PS2 18 (SUTURE) ×3 IMPLANT
SUT PROLENE 6 0 BV (SUTURE) ×3 IMPLANT
SUT PROLENE 7 0 BV 1 (SUTURE) ×3 IMPLANT
SUT VIC AB 3-0 SH 27 (SUTURE) ×2
SUT VIC AB 3-0 SH 27X BRD (SUTURE) ×1 IMPLANT
TOWEL OR 17X24 6PK STRL BLUE (TOWEL DISPOSABLE) ×3 IMPLANT
TOWEL OR 17X26 10 PK STRL BLUE (TOWEL DISPOSABLE) ×3 IMPLANT
UNDERPAD 30X30 INCONTINENT (UNDERPADS AND DIAPERS) ×3 IMPLANT
WATER STERILE IRR 1000ML POUR (IV SOLUTION) ×3 IMPLANT

## 2013-09-11 NOTE — Op Note (Signed)
OPERATIVE NOTE   PROCEDURE: left radiocephaic arteriovenous fistula placement  PRE-OPERATIVE DIAGNOSIS: chronic kidney disease stage IV-V   POST-OPERATIVE DIAGNOSIS: same as above   SURGEON: Mirelle Biskup LIANG-YU, MD  ANESTHESIA: local and MAC  ESTIMATED BLOOD LOSS: 30 cc  FINDING(S): Palpable thrill at end of case with dopplerable radial signal  SPECIMEN(S):  none  INDICATIONS:   Lucas Calderon is a 73 y.o. male who presents with chronic kidney disease stage IV-V.  The patient is scheduled for left radiocephalic arteriovenous fistula placement.  The patient is aware the risks include but are not limited to: bleeding, infection, steal syndrome, nerve damage, ischemic monomelic neuropathy, failure to mature, and need for additional procedures.  The patient is aware of the risks of the procedure and elects to proceed forward.  DESCRIPTION: After full informed written consent was obtained from the patient, the patient was brought back to the operating room and placed supine upon the operating table.  Prior to induction, the patient received IV antibiotics.   After obtaining adequate anesthesia, the patient was then prepped and draped in the standard fashion for a left arm access procedure.  I turned my attention first to identifying the patient's distal cephalic vein and radial artery.  Using SonoSite guidance, the location of these vessels were marked out on the skin.   At this point, I injected local anesthetic to obtain a field block of the wrist.  In total, I injected about 5 mL of a 1:1 mixture of 0.5% Marcaine without epinephrine and 1% lidocaine with epinephrine.  I made a longitudinal incision at the level of the wrist and dissected through the subcutaneous tissue and fascia to gain exposure of the radial artery.  This was noted to be 2.5 mm in diameter externally.  This was dissected out proximally and distally and controlled with vessel loops .  I then dissected out the cephalic  vein.  This was noted to be 2.5 mm in diameter externally.  The distal segment of the vein was ligated with a  2-0 silk, and the vein was transected.  The proximal segment was iinterrogated with serial dilators.  The vein accepted up to a 3 mm dilator without any difficulty.  I then instilled the heparinized saline into the vein and clamped it.  At this point, I reset my exposure of the radial artery and placed the artery under tension proximally and distally.  I made an arteriotomy with a #11 blade, and then I extended the arteriotomy with a Potts scissor.  I injected heparinized saline proximal and distal to this arteriotomy.  The vein was then sewn to the artery in an end-to-side configuration with a running stitch of 7-0 Prolene.  Prior to completing this anastomosis, I allowed the vein and artery to backbleed.  There was no evidence of clot from any vessels.  I completed the anastomosis in the usual fashion and then released all vessel loops and clamps.  There was a palpable  thrill in the venous outflow, and there was a dopplerable radial signal.  At this point, I irrigated out the surgical wound.  There was no further active bleeding.  The subcutaneous tissue was reapproximated with a running stitch of 3-0 Vicryl.  The skin was then reapproximated with a running subcuticular stitch of 4-0 Vicryl.  The skin was then cleaned, dried, and reinforced with Dermabond.  The patient tolerated this procedure well.   COMPLICATIONS: none  CONDITION: stable   Hinda Lenis, MD 09/11/2013 8:54 AM

## 2013-09-11 NOTE — Interval H&P Note (Signed)
History and Physical Interval Note:  09/11/2013 7:12 AM  Lucas Calderon  has presented today for surgery, with the diagnosis of Chronic kidney disease  The various methods of treatment have been discussed with the patient and family. After consideration of risks, benefits and other options for treatment, the patient has consented to  Procedure(s): ARTERIOVENOUS (AV) FISTULA CREATION- RADIOCEPHALIC VS BRACHIOCEPHALIC AVF (Left) as a surgical intervention .  The patient's history has been reviewed, patient examined, no change in status, stable for surgery.  I have reviewed the patient's chart and labs.  Questions were answered to the patient's satisfaction.     Wadsworth Skolnick LIANG-YU

## 2013-09-11 NOTE — Transfer of Care (Signed)
Immediate Anesthesia Transfer of Care Note  Patient: Lucas Calderon  Procedure(s) Performed: Procedure(s):  CREATION- RADIOCEPHALIC arteriovenous fistula (Left)  Patient Location: PACU  Anesthesia Type:MAC  Level of Consciousness: awake  Airway & Oxygen Therapy: Patient Spontanous Breathing and Patient connected to nasal cannula oxygen  Post-op Assessment: Report given to PACU RN, Post -op Vital signs reviewed and stable and Patient moving all extremities  Post vital signs: Reviewed and stable  Complications: No apparent anesthesia complications

## 2013-09-11 NOTE — Anesthesia Postprocedure Evaluation (Signed)
  Anesthesia Post-op Note  Patient: Lucas Calderon  Procedure(s) Performed: Procedure(s):  CREATION- RADIOCEPHALIC arteriovenous fistula (Left)  Patient Location: PACU  Anesthesia Type:MAC  Level of Consciousness: awake  Airway and Oxygen Therapy: Patient Spontanous Breathing  Post-op Pain: mild  Post-op Assessment: Post-op Vital signs reviewed  Post-op Vital Signs: Reviewed  Complications: No apparent anesthesia complications

## 2013-09-11 NOTE — Preoperative (Signed)
Beta Blockers   Reason not to administer Beta Blockers:Not Applicable 

## 2013-09-11 NOTE — H&P (View-Only) (Signed)
Referred by:  Marlena Clipper, NP [address] [place], [place] [address]  Reason for referral: New access  History of Present Illness  Lucas Calderon is a 73 y.o. ([birth year]) male who presents for evaluation for permanent access.  The patient is right hand dominant.  The patient has not had previous access procedures.  Previous central venous cannulation procedures include: none.  The patient has never had a PPM placed.   Past Medical History  Diagnosis Date  . Anemia   . Diabetes mellitus without complication   . Chronic kidney disease     Past Surgical History  Procedure Laterality Date  . Appendectomy      History   Social History  . Marital Status: Unknown    Spouse Name: N/A    Number of Children: N/A  . Years of Education: N/A   Occupational History  . Not on file.   Social History Main Topics  . Smoking status: Former Research scientist (life sciences)  . Smokeless tobacco: Not on file  . Alcohol Use: No  . Drug Use: No  . Sexual Activity: Not on file   Other Topics Concern  . Not on file   Social History Narrative  . No narrative on file    Family History  Problem Relation Age of Onset  . Diabetes Mother   . Heart disease Mother   . Hypertension Mother   . Diabetes Father   . Hypertension Father   . Peripheral vascular disease Father   . Hypertension Sister    Current Outpatient Prescriptions  Medication Sig Dispense Refill  . albuterol (PROVENTIL HFA) 108 (90 BASE) MCG/ACT inhaler Inhale 2 puffs into the lungs every 6 (six) hours as needed for wheezing or shortness of breath.      Marland Kitchen amLODipine (NORVASC) 10 MG tablet Take 10 mg by mouth daily.      Marland Kitchen aspirin 81 MG tablet Take 81 mg by mouth daily.      Marland Kitchen b complex vitamins tablet Take 1 tablet by mouth daily.      . carvedilol (COREG) 12.5 MG tablet Take 12.5 mg by mouth 2 (two) times daily with a meal.      . HYDRALAZINE HCL PO Take 75 mg by mouth 4 (four) times daily.      . Insulin Glargine (LANTUS [place]) Inject  20 Units into the skin daily.      . isosorbide mononitrate (IMDUR) 60 MG 24 hr tablet Take 60 mg by mouth daily.      Marland Kitchen torsemide (DEMADEX) 20 MG tablet Take 20 mg by mouth daily.      . furosemide (LASIX) 20 MG tablet       . LANTUS SOLOSTAR 100 UNIT/ML Solostar Pen       . losartan-hydrochlorothiazide (HYZAAR) 100-12.5 MG per tablet       . metFORMIN (GLUCOPHAGE-XR) 500 MG 24 hr tablet       . metoprolol succinate (TOPROL-XL) 50 MG 24 hr tablet        No current facility-administered medications for this visit.     Allergies  Allergen Reactions  . Penicillins    REVIEW OF SYSTEMS:  (Positives checked otherwise negative)  CARDIOVASCULAR:  []  chest pain, []  chest pressure, []  palpitations, []  shortness of breath when laying flat, [x]  shortness of breath with exertion,  []  pain in feet when walking, []  pain in feet when laying flat, []  history of blood clot in veins (DVT), []  history of phlebitis, [x]  swelling in  legs, []  varicose veins  PULMONARY:  []  productive cough, []  asthma, []  wheezing  NEUROLOGIC:  []  weakness in arms or legs, []  numbness in arms or legs, []  difficulty speaking or slurred speech, []  temporary loss of vision in one eye, []  dizziness  HEMATOLOGIC:  []  bleeding problems, []  problems with blood clotting too easily  MUSCULOSKEL:  []  joint pain, []  joint swelling  GASTROINTEST:  []  vomiting blood, []  blood in stool     GENITOURINARY:  []  burning with urination, []  blood in urine  PSYCHIATRIC:  []  history of major depression  INTEGUMENTARY:  []  rashes, []  ulcers  CONSTITUTIONAL:  []  fever, []  chills   Physical Examination  Filed Vitals:   09/01/13 1521  BP: 147/66  Pulse: 82  Weight: 183 lb (83.008 kg)  SpO2: 100%   There is no height on file to calculate BMI.  General: A&O x 3, WDWN, happy  Head: [place]/AT  Ear/Nose/Throat: Hearing grossly intact, nares w/o erythema or drainage, oropharynx w/o Erythema/Exudate, Mallampati score: 3  Eyes: PERRLA,  EOMI  Neck: Supple, no nuchal rigidity, no palpable LAD  Pulmonary: Sym exp, good air movt, CTAB, no rales, rhonchi, & wheezing  Cardiac: RRR, Nl S1, S2, no Murmurs, rubs or gallops  Vascular: Vessel Right Left  Radial Palpable Palpable  Brachial Palpable Palpable  Carotid Palpable, without bruit Palpable, without bruit  Aorta Not palpable N/A  Femoral Palpable Palpable  Popliteal Not palpable Not palpable  PT Not Palpable Not Palpable  DP Not Palpable Not Palpable   Gastrointestinal: soft, NTND, -G/R, - HSM, - masses, - CVAT B  Musculoskeletal: M/S 5/5 throughout , Extremities without ischemic changes , 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 with well healed skin  Neurologic: CN grossly intact, Pain and light touch intact in extremities , Motor exam as listed above  Psychiatric: Judgment intact, Mood & affect appropriate for pt's clinical situation, some elements of cognitive dysfunction  Dermatologic: See M/S exam for extremity exam, no rashes otherwise noted  Lymph : No Cervical, Axillary, or Inguinal lymphadenopathy   Non-Invasive Vascular Imaging  Vein Mapping  (Date: 09/01/2013):   R arm: acceptable vein conduits include entire cephalic   L arm: acceptable vein conduits include entire cephalic and upper arm basilic  BUE Arterial doppler (09/01/2013)  Triphasic throughout bilaterally   Medical Decision Making  Lucas Calderon is a 73 y.o. 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  Unfortunately, no labwork available.  Based on vein mapping and examination, this patient's permanent access options include: R RC vs BC AVF, L RC vs BC AVF, L staged BVT.  I would start wth L RC vs BC AVF.  Will obtain BMP on day of surgery to confirm severity of patient's CKD.  I had an extensive discussion with this patient in regards to the nature of access  surgery, including risk, benefits, and alternatives.    The patient is aware that the risks of access surgery include but are not limited to: bleeding, infection, steal syndrome, nerve damage, ischemic monomelic neuropathy, failure of access to mature, and possible need for additional access procedures in the future.  The patient has agreed to proceed with the above procedure which will be scheduled 23 FEB 15.  Adele Barthel, MD [facility] Office: [phone] Pager: [phone]  09/01/2013, 3:54 PM

## 2013-09-11 NOTE — Progress Notes (Signed)
Report called to [facility] in [place] for the operative procedure we gave to Mr. Frederico Hamman

## 2013-09-11 NOTE — Anesthesia Preprocedure Evaluation (Signed)
Anesthesia Evaluation  Patient identified by MRN, date of birth, ID band Patient awake    Reviewed: Allergy & Precautions, H&P , reviewed documented beta blocker date and time   Airway Mallampati: II      Dental   Pulmonary pneumonia -, former smoker,          Cardiovascular hypertension,     Neuro/Psych    GI/Hepatic GERD-  ,  Endo/Other  diabetes  Renal/GU Renal disease     Musculoskeletal   Abdominal   Peds  Hematology   Anesthesia Other Findings   Reproductive/Obstetrics                           Anesthesia Physical Anesthesia Plan  ASA: III  Anesthesia Plan: MAC   Post-op Pain Management:    Induction: Intravenous  Airway Management Planned: Simple Face Mask  Additional Equipment:   Intra-op Plan:   Post-operative Plan:   Informed Consent:   Dental advisory given  Plan Discussed with: CRNA and Anesthesiologist  Anesthesia Plan Comments:         Anesthesia Quick Evaluation

## 2013-09-11 NOTE — Telephone Encounter (Addendum)
Message copied by Gena Fray on Mon Sep 11, 2013  3:17 PM ------      Message from: Denman George      Created: Mon Sep 11, 2013 11:18 AM      Regarding: [name]                   ----- Message -----         From: Conrad [place], MD         Sent: 09/11/2013   8:56 AM           To: [facility] [address]            Lucas Calderon      [id number]      May 13, 1941            Procedure: L RC AVF            ollow-up: 6 weeks       ------  09/11/13: lm for pt at [facility]

## 2013-09-12 ENCOUNTER — Encounter (HOSPITAL_COMMUNITY): Payer: Self-pay | Admitting: Vascular Surgery

## 2013-09-25 ENCOUNTER — Telehealth: Payer: Self-pay | Admitting: Family Medicine

## 2013-09-25 ENCOUNTER — Inpatient Hospital Stay (HOSPITAL_COMMUNITY)
Admission: EM | Admit: 2013-09-25 | Discharge: 2013-09-30 | DRG: 291 | Disposition: A | Discharge status: Home / Self Care | Payer: Medicare Other | Source: Other Acute Inpatient Hospital | Attending: Internal Medicine | Admitting: Internal Medicine

## 2013-09-25 DIAGNOSIS — I509 Heart failure, unspecified: Secondary | ICD-10-CM | POA: Diagnosis present

## 2013-09-25 DIAGNOSIS — IMO0001 Reserved for inherently not codable concepts without codable children: Principal | ICD-10-CM | POA: Diagnosis present

## 2013-09-25 DIAGNOSIS — I5033 Acute on chronic diastolic (congestive) heart failure: Secondary | ICD-10-CM | POA: Diagnosis present

## 2013-09-25 DIAGNOSIS — I10 Essential (primary) hypertension: Secondary | ICD-10-CM

## 2013-09-25 DIAGNOSIS — Z8249 Family history of ischemic heart disease and other diseases of the circulatory system: Secondary | ICD-10-CM

## 2013-09-25 DIAGNOSIS — J438 Other emphysema: Secondary | ICD-10-CM | POA: Diagnosis not present

## 2013-09-25 DIAGNOSIS — N179 Acute kidney failure, unspecified: Secondary | ICD-10-CM | POA: Diagnosis present

## 2013-09-25 DIAGNOSIS — N185 Chronic kidney disease, stage 5: Secondary | ICD-10-CM

## 2013-09-25 DIAGNOSIS — Z794 Long term (current) use of insulin: Secondary | ICD-10-CM

## 2013-09-25 DIAGNOSIS — K219 Gastro-esophageal reflux disease without esophagitis: Secondary | ICD-10-CM | POA: Diagnosis not present

## 2013-09-25 DIAGNOSIS — J9600 Acute respiratory failure, unspecified whether with hypoxia or hypercapnia: Secondary | ICD-10-CM | POA: Diagnosis present

## 2013-09-25 DIAGNOSIS — F411 Generalized anxiety disorder: Secondary | ICD-10-CM | POA: Diagnosis not present

## 2013-09-25 DIAGNOSIS — N189 Chronic kidney disease, unspecified: Secondary | ICD-10-CM

## 2013-09-25 DIAGNOSIS — Z833 Family history of diabetes mellitus: Secondary | ICD-10-CM | POA: Diagnosis not present

## 2013-09-25 DIAGNOSIS — Z880 Allergy status to penicillin: Secondary | ICD-10-CM | POA: Diagnosis not present

## 2013-09-25 DIAGNOSIS — D649 Anemia, unspecified: Secondary | ICD-10-CM | POA: Diagnosis present

## 2013-09-25 DIAGNOSIS — D631 Anemia in chronic kidney disease: Secondary | ICD-10-CM | POA: Diagnosis present

## 2013-09-25 DIAGNOSIS — R0609 Other forms of dyspnea: Secondary | ICD-10-CM | POA: Diagnosis not present

## 2013-09-25 DIAGNOSIS — E119 Type 2 diabetes mellitus without complications: Secondary | ICD-10-CM | POA: Diagnosis present

## 2013-09-25 DIAGNOSIS — E7800 Pure hypercholesterolemia, unspecified: Secondary | ICD-10-CM | POA: Diagnosis not present

## 2013-09-25 DIAGNOSIS — Z6828 Body mass index (BMI) 28.0-28.9, adult: Secondary | ICD-10-CM | POA: Diagnosis not present

## 2013-09-25 DIAGNOSIS — N039 Chronic nephritic syndrome with unspecified morphologic changes: Secondary | ICD-10-CM

## 2013-09-25 DIAGNOSIS — Z992 Dependence on renal dialysis: Secondary | ICD-10-CM | POA: Diagnosis not present

## 2013-09-25 DIAGNOSIS — R079 Chest pain, unspecified: Secondary | ICD-10-CM | POA: Diagnosis not present

## 2013-09-25 DIAGNOSIS — N2581 Secondary hyperparathyroidism of renal origin: Secondary | ICD-10-CM | POA: Diagnosis present

## 2013-09-25 DIAGNOSIS — J9601 Acute respiratory failure with hypoxia: Secondary | ICD-10-CM | POA: Diagnosis present

## 2013-09-25 DIAGNOSIS — A419 Sepsis, unspecified organism: Secondary | ICD-10-CM | POA: Diagnosis not present

## 2013-09-25 DIAGNOSIS — R05 Cough: Secondary | ICD-10-CM | POA: Diagnosis not present

## 2013-09-25 DIAGNOSIS — J189 Pneumonia, unspecified organism: Secondary | ICD-10-CM

## 2013-09-25 DIAGNOSIS — Z7982 Long term (current) use of aspirin: Secondary | ICD-10-CM | POA: Diagnosis not present

## 2013-09-25 DIAGNOSIS — R0902 Hypoxemia: Secondary | ICD-10-CM | POA: Diagnosis not present

## 2013-09-25 DIAGNOSIS — I369 Nonrheumatic tricuspid valve disorder, unspecified: Secondary | ICD-10-CM | POA: Diagnosis not present

## 2013-09-25 DIAGNOSIS — Z87891 Personal history of nicotine dependence: Secondary | ICD-10-CM | POA: Diagnosis not present

## 2013-09-25 DIAGNOSIS — N184 Chronic kidney disease, stage 4 (severe): Secondary | ICD-10-CM | POA: Diagnosis not present

## 2013-09-25 DIAGNOSIS — J984 Other disorders of lung: Secondary | ICD-10-CM | POA: Diagnosis not present

## 2013-09-25 DIAGNOSIS — I120 Hypertensive chronic kidney disease with stage 5 chronic kidney disease or end stage renal disease: Secondary | ICD-10-CM | POA: Diagnosis not present

## 2013-09-25 DIAGNOSIS — R0602 Shortness of breath: Secondary | ICD-10-CM | POA: Diagnosis not present

## 2013-09-25 DIAGNOSIS — I129 Hypertensive chronic kidney disease with stage 1 through stage 4 chronic kidney disease, or unspecified chronic kidney disease: Secondary | ICD-10-CM | POA: Diagnosis not present

## 2013-09-25 DIAGNOSIS — N19 Unspecified kidney failure: Secondary | ICD-10-CM | POA: Diagnosis not present

## 2013-09-25 DIAGNOSIS — Z79899 Other long term (current) drug therapy: Secondary | ICD-10-CM | POA: Diagnosis not present

## 2013-09-25 DIAGNOSIS — R059 Cough, unspecified: Secondary | ICD-10-CM | POA: Diagnosis not present

## 2013-09-25 DIAGNOSIS — R6520 Severe sepsis without septic shock: Secondary | ICD-10-CM | POA: Diagnosis not present

## 2013-09-25 DIAGNOSIS — R0989 Other specified symptoms and signs involving the circulatory and respiratory systems: Secondary | ICD-10-CM | POA: Diagnosis not present

## 2013-09-25 DIAGNOSIS — J811 Chronic pulmonary edema: Secondary | ICD-10-CM | POA: Diagnosis present

## 2013-09-25 DIAGNOSIS — J988 Other specified respiratory disorders: Secondary | ICD-10-CM | POA: Diagnosis not present

## 2013-09-25 NOTE — Telephone Encounter (Signed)
Received call from [place] at [facility]. Pt with ESRD s/p fistula placement 2 weeks ago. Also with IDDM. With large R sided PNA on CXR today. Nonhypoxic.  Dr. @ 5.3 per report which is pt's baseline. EDP concerned about sepsis.  Afebrile. BP stable. Will admit to SDU. Start HCAP treatment on arrival. Will also need renal consult.

## 2013-09-25 NOTE — Evaluation (Addendum)
Received call from [place] at [facility]. Pt with ESRD s/p fistula placement 2 weeks ago. Also with IDDM. With large R sided PNA on CXR today. Initially 90% on RA. Went to 94% on 3L. Cr. @ 5.3 per report which is pt's baseline. Also with pro BNP @ 30k in settin gof ESRD. EDP concerned about sepsis. Afebrile. BP stable. Will admit to SDU. Start HCAP treatment on arrival. Will also need renal consult.

## 2013-09-26 ENCOUNTER — Inpatient Hospital Stay (HOSPITAL_COMMUNITY): Payer: Medicare Other

## 2013-09-26 ENCOUNTER — Encounter (HOSPITAL_COMMUNITY): Payer: Self-pay | Admitting: *Deleted

## 2013-09-26 DIAGNOSIS — I10 Essential (primary) hypertension: Secondary | ICD-10-CM | POA: Diagnosis present

## 2013-09-26 DIAGNOSIS — J811 Chronic pulmonary edema: Secondary | ICD-10-CM

## 2013-09-26 DIAGNOSIS — N189 Chronic kidney disease, unspecified: Secondary | ICD-10-CM

## 2013-09-26 DIAGNOSIS — E119 Type 2 diabetes mellitus without complications: Secondary | ICD-10-CM | POA: Diagnosis present

## 2013-09-26 DIAGNOSIS — I369 Nonrheumatic tricuspid valve disorder, unspecified: Secondary | ICD-10-CM

## 2013-09-26 DIAGNOSIS — D631 Anemia in chronic kidney disease: Secondary | ICD-10-CM | POA: Diagnosis present

## 2013-09-26 DIAGNOSIS — N184 Chronic kidney disease, stage 4 (severe): Secondary | ICD-10-CM

## 2013-09-26 DIAGNOSIS — J9600 Acute respiratory failure, unspecified whether with hypoxia or hypercapnia: Secondary | ICD-10-CM

## 2013-09-26 DIAGNOSIS — J9601 Acute respiratory failure with hypoxia: Secondary | ICD-10-CM | POA: Diagnosis present

## 2013-09-26 LAB — BLOOD GAS, ARTERIAL
Acid-base deficit: 3.2 mmol/L — ABNORMAL HIGH (ref 0.0–2.0)
Bicarbonate: 20.3 mEq/L (ref 20.0–24.0)
Drawn by: 41308
O2 Content: 6 L/min
O2 Saturation: 90.4 %
Patient temperature: 98.6
TCO2: 21.2 mmol/L (ref 0–100)
pCO2 arterial: 30.4 mmHg — ABNORMAL LOW (ref 35.0–45.0)
pH, Arterial: 7.439 (ref 7.350–7.450)
pO2, Arterial: 55.3 mmHg — ABNORMAL LOW (ref 80.0–100.0)

## 2013-09-26 LAB — GLUCOSE, CAPILLARY
Glucose-Capillary: 106 mg/dL — ABNORMAL HIGH (ref 70–99)
Glucose-Capillary: 136 mg/dL — ABNORMAL HIGH (ref 70–99)
Glucose-Capillary: 141 mg/dL — ABNORMAL HIGH (ref 70–99)
Glucose-Capillary: 192 mg/dL — ABNORMAL HIGH (ref 70–99)
Glucose-Capillary: 193 mg/dL — ABNORMAL HIGH (ref 70–99)
Glucose-Capillary: 200 mg/dL — ABNORMAL HIGH (ref 70–99)

## 2013-09-26 LAB — CBC WITH DIFFERENTIAL/PLATELET
Basophils Absolute: 0 10*3/uL (ref 0.0–0.1)
Basophils Relative: 0 % (ref 0–1)
Eosinophils Absolute: 0 10*3/uL (ref 0.0–0.7)
Eosinophils Relative: 0 % (ref 0–5)
HCT: 23.5 % — ABNORMAL LOW (ref 39.0–52.0)
Hemoglobin: 7.9 g/dL — ABNORMAL LOW (ref 13.0–17.0)
Lymphocytes Relative: 10 % — ABNORMAL LOW (ref 12–46)
Lymphs Abs: 1.2 10*3/uL (ref 0.7–4.0)
MCH: 28.2 pg (ref 26.0–34.0)
MCHC: 33.6 g/dL (ref 30.0–36.0)
MCV: 83.9 fL (ref 78.0–100.0)
Monocytes Absolute: 0.8 10*3/uL (ref 0.1–1.0)
Monocytes Relative: 7 % (ref 3–12)
Neutro Abs: 10.3 10*3/uL — ABNORMAL HIGH (ref 1.7–7.7)
Neutrophils Relative %: 83 % — ABNORMAL HIGH (ref 43–77)
Platelets: 163 10*3/uL (ref 150–400)
RBC: 2.8 MIL/uL — ABNORMAL LOW (ref 4.22–5.81)
RDW: 15.4 % (ref 11.5–15.5)
WBC: 12.4 10*3/uL — ABNORMAL HIGH (ref 4.0–10.5)

## 2013-09-26 LAB — IRON AND TIBC
Iron: 12 ug/dL — ABNORMAL LOW (ref 42–135)
Saturation Ratios: 7 % — ABNORMAL LOW (ref 20–55)
TIBC: 183 ug/dL — ABNORMAL LOW (ref 215–435)
UIBC: 171 ug/dL (ref 125–400)

## 2013-09-26 LAB — PROTIME-INR
INR: 1.27 (ref 0.00–1.49)
Prothrombin Time: 15.6 s — ABNORMAL HIGH (ref 11.6–15.2)

## 2013-09-26 LAB — COMPREHENSIVE METABOLIC PANEL
ALBUMIN: 2.7 g/dL — AB (ref 3.5–5.2)
ALK PHOS: 60 U/L (ref 39–117)
ALT: 19 U/L (ref 0–53)
AST: 16 U/L (ref 0–37)
BUN: 75 mg/dL — ABNORMAL HIGH (ref 6–23)
CHLORIDE: 103 meq/L (ref 96–112)
CO2: 19 meq/L (ref 19–32)
Calcium: 8.8 mg/dL (ref 8.4–10.5)
Creatinine, Ser: 4.81 mg/dL — ABNORMAL HIGH (ref 0.50–1.35)
GFR calc Af Amer: 13 mL/min — ABNORMAL LOW (ref >90)
GFR calc non Af Amer: 11 mL/min — ABNORMAL LOW (ref >90)
Glucose, Bld: 159 mg/dL — ABNORMAL HIGH (ref 70–99)
POTASSIUM: 4.2 meq/L (ref 3.7–5.3)
Sodium: 142 mEq/L (ref 137–147)
Total Bilirubin: 0.5 mg/dL (ref 0.3–1.2)
Total Protein: 7.3 g/dL (ref 6.0–8.3)

## 2013-09-26 LAB — AMYLASE: Amylase: 61 U/L (ref 0–105)

## 2013-09-26 LAB — MRSA PCR SCREENING: MRSA by PCR: NEGATIVE

## 2013-09-26 LAB — HEMOGLOBIN A1C
HEMOGLOBIN A1C: 7.5 % — AB (ref <5.7)
Mean Plasma Glucose: 169 mg/dL — ABNORMAL HIGH (ref <117)

## 2013-09-26 LAB — TROPONIN I
Troponin I: <0.3 ng/mL (ref <0.30)
Troponin I: <0.3 ng/mL
Troponin I: <0.3 ng/mL

## 2013-09-26 LAB — FERRITIN: Ferritin: 662 ng/mL — ABNORMAL HIGH (ref 22–322)

## 2013-09-26 LAB — LEGIONELLA ANTIGEN, URINE: Legionella Antigen, Urine: NEGATIVE

## 2013-09-26 LAB — STREP PNEUMONIAE URINARY ANTIGEN: Strep Pneumo Urinary Antigen: NEGATIVE

## 2013-09-26 LAB — PROCALCITONIN: Procalcitonin: 0.47 ng/mL

## 2013-09-26 LAB — MAGNESIUM: Magnesium: 2.3 mg/dL (ref 1.5–2.5)

## 2013-09-26 LAB — PHOSPHORUS: Phosphorus: 5.4 mg/dL — ABNORMAL HIGH (ref 2.3–4.6)

## 2013-09-26 MED ORDER — VANCOMYCIN HCL IN DEXTROSE 1-5 GM/200ML-% IV SOLN: 1000.0000 mg | INTRAVENOUS | Status: DC

## 2013-09-26 MED ORDER — HEPARIN SODIUM (PORCINE) 5000 UNIT/ML IJ SOLN
5000.0000 [IU] | Freq: Three times a day (TID) | INTRAMUSCULAR | Status: DC
  Administered 2013-09-26 – 2013-09-30 (×13): 5000 [IU] via SUBCUTANEOUS
  Filled 2013-09-26 (×15): qty 1

## 2013-09-26 MED ORDER — DARBEPOETIN ALFA-POLYSORBATE 100 MCG/0.5ML IJ SOLN
100.0000 ug | INTRAMUSCULAR | Status: DC
  Administered 2013-09-26: 100 ug via SUBCUTANEOUS
  Filled 2013-09-26: qty 0.5

## 2013-09-26 MED ORDER — INSULIN ASPART 100 UNIT/ML ~~LOC~~ SOLN: 0.0000 [IU] | Freq: Every day | SUBCUTANEOUS | Status: DC

## 2013-09-26 MED ORDER — INSULIN GLARGINE 100 UNIT/ML ~~LOC~~ SOLN
20.0000 [IU] | Freq: Every day | SUBCUTANEOUS | Status: DC
  Administered 2013-09-26 – 2013-09-30 (×5): 20 [IU] via SUBCUTANEOUS
  Filled 2013-09-26 (×5): qty 0.2

## 2013-09-26 MED ORDER — FUROSEMIDE 10 MG/ML IJ SOLN: 40.0000 mg | Freq: Three times a day (TID) | INTRAMUSCULAR | Status: DC

## 2013-09-26 MED ORDER — AMLODIPINE BESYLATE 10 MG PO TABS
10.0000 mg | ORAL_TABLET | Freq: Every day | ORAL | Status: DC
  Administered 2013-09-26 – 2013-09-30 (×5): 10 mg via ORAL
  Filled 2013-09-26 (×5): qty 1

## 2013-09-26 MED ORDER — DEXTROSE 5 % IV SOLN
2.0000 g | INTRAVENOUS | Status: DC
  Filled 2013-09-26: qty 2

## 2013-09-26 MED ORDER — LEVOFLOXACIN IN D5W 500 MG/100ML IV SOLN: 500.0000 mg | INTRAVENOUS | Status: DC

## 2013-09-26 MED ORDER — INSULIN ASPART 100 UNIT/ML ~~LOC~~ SOLN
0.0000 [IU] | Freq: Three times a day (TID) | SUBCUTANEOUS | Status: DC
  Administered 2013-09-26: 2 [IU] via SUBCUTANEOUS

## 2013-09-26 MED ORDER — BIOTENE DRY MOUTH MT LIQD
15.0000 mL | Freq: Two times a day (BID) | OROMUCOSAL | Status: DC
  Administered 2013-09-26 – 2013-09-30 (×10): 15 mL via OROMUCOSAL

## 2013-09-26 MED ORDER — LOSARTAN POTASSIUM-HCTZ 100-12.5 MG PO TABS: 1.0000 | ORAL_TABLET | Freq: Every day | ORAL | Status: DC

## 2013-09-26 MED ORDER — ISOSORBIDE MONONITRATE ER 60 MG PO TB24
60.0000 mg | ORAL_TABLET | Freq: Every day | ORAL | Status: DC
  Administered 2013-09-26 – 2013-09-30 (×5): 60 mg via ORAL
  Filled 2013-09-26 (×5): qty 1

## 2013-09-26 MED ORDER — FUROSEMIDE 10 MG/ML IJ SOLN
40.0000 mg | Freq: Two times a day (BID) | INTRAMUSCULAR | Status: DC
  Administered 2013-09-26 (×2): 40 mg via INTRAVENOUS
  Filled 2013-09-26 (×3): qty 4

## 2013-09-26 MED ORDER — HYDRALAZINE HCL 50 MG PO TABS
75.0000 mg | ORAL_TABLET | Freq: Four times a day (QID) | ORAL | Status: DC
  Administered 2013-09-26 – 2013-09-30 (×18): 75 mg via ORAL
  Filled 2013-09-26 (×23): qty 1

## 2013-09-26 MED ORDER — INSULIN ASPART 100 UNIT/ML ~~LOC~~ SOLN
0.0000 [IU] | SUBCUTANEOUS | Status: DC
  Administered 2013-09-26: 2 [IU] via SUBCUTANEOUS

## 2013-09-26 MED ORDER — LEVOFLOXACIN IN D5W 750 MG/150ML IV SOLN
750.0000 mg | Freq: Once | INTRAVENOUS | Status: AC
  Administered 2013-09-26: 750 mg via INTRAVENOUS
  Filled 2013-09-26: qty 150

## 2013-09-26 MED ORDER — FUROSEMIDE 80 MG PO TABS
80.0000 mg | ORAL_TABLET | Freq: Two times a day (BID) | ORAL | Status: DC
  Administered 2013-09-26: 80 mg via ORAL
  Filled 2013-09-26 (×4): qty 1

## 2013-09-26 MED ORDER — CALCIUM ACETATE 667 MG PO CAPS
667.0000 mg | ORAL_CAPSULE | Freq: Three times a day (TID) | ORAL | Status: DC
  Administered 2013-09-26 – 2013-09-30 (×13): 667 mg via ORAL
  Filled 2013-09-26 (×16): qty 1

## 2013-09-26 MED ORDER — ASPIRIN EC 81 MG PO TBEC
81.0000 mg | DELAYED_RELEASE_TABLET | Freq: Every day | ORAL | Status: DC
  Administered 2013-09-26 – 2013-09-30 (×5): 81 mg via ORAL
  Filled 2013-09-26 (×5): qty 1

## 2013-09-26 MED ORDER — CARVEDILOL 12.5 MG PO TABS
12.5000 mg | ORAL_TABLET | Freq: Two times a day (BID) | ORAL | Status: DC
  Administered 2013-09-26 – 2013-09-30 (×9): 12.5 mg via ORAL
  Filled 2013-09-26 (×12): qty 1

## 2013-09-26 MED ORDER — VANCOMYCIN HCL 10 G IV SOLR
1750.0000 mg | INTRAVENOUS | Status: DC
  Filled 2013-09-26: qty 1750

## 2013-09-26 MED ORDER — INSULIN ASPART 100 UNIT/ML ~~LOC~~ SOLN
0.0000 [IU] | Freq: Three times a day (TID) | SUBCUTANEOUS | Status: DC
  Administered 2013-09-26 – 2013-09-28 (×3): 2 [IU] via SUBCUTANEOUS
  Administered 2013-09-29: 1 [IU] via SUBCUTANEOUS
  Administered 2013-09-29: 3 [IU] via SUBCUTANEOUS
  Administered 2013-09-30: 5 [IU] via SUBCUTANEOUS

## 2013-09-26 MED ORDER — SODIUM CHLORIDE 0.9 % IV SOLN: 250.0000 mL | INTRAVENOUS | Status: DC | PRN

## 2013-09-26 MED ORDER — DEXTROSE 5 % IV SOLN
500.0000 mg | Freq: Three times a day (TID) | INTRAVENOUS | Status: DC
  Filled 2013-09-26 (×2): qty 0.5

## 2013-09-26 MED FILL — Furosemide Inj 10 MG/ML: INTRAMUSCULAR | Qty: 10 | Status: AC

## 2013-09-26 NOTE — Progress Notes (Signed)
Lucas Calderon - Stepdown / ICU Progress Note  Lucas Calderon [id number] DOB: 03-Apr-1941 DOA: 09/25/2013 PCP: Maryella Shivers, MD  Brief narrative: 73 year old male patient with stage V chronic kidney disease. Transfer from [facility] on 09/26/2013 with a one-day history of increased work of breathing in setting of asymmetric pulmonary edema. The patient endorsed that over the past weekend he had significant dietary indiscretions including high sodium intake. He was admitted with pulmonary edema and volume overload in setting of severe chronic kidney disease. Community acquired pneumonias in the differential as well but felt to be less likely.  Assessment/Plan: Active Problems:   CKD (chronic kidney disease), stage V -Per nephrology -Patient has recent fistula placement February 2015 but no indications to initiate dialysis at this time -Nephrology has changed to oral Lasix and had discontinued losartan begun PhosLo - -2500 cc since admission    Pulmonary edema/Acute respiratory failure with hypoxia -Precipitated by dietary indiscretion in patient with severe kidney disease -Still requiring oxygen at relatively high levels the patient reports previous symptoms have resolved -Appears to be diuresing adequately so hopefully can wean oxygen    HTN (hypertension)/grade 2 chronic diastolic heart failure -Echocardiogram this admission shows a new finding of grade 2 diastolic dysfunction -Continue Norvasc, Apresoline, Imdur and Coreg - Nephrology has dc'd previous losartan    Anemia in CKD (chronic kidney disease) -Nephrology has started Aranesp this admission    Diabetes mellitus, type 2 -Current CBGs well controlled -Continue Lantus   DVT prophylaxis: subcutaneous heparin  Code Status:  full  Family Communication: no family at bedside  Disposition Plan/Expected LOS: step down    Consultants:  nephrology   pccm  Procedures: 2-D echocardiogram  - Left ventricle:  The cavity size was normal. Wall thickness was increased in a pattern of mild LVH. Systolic function was normal. The estimated ejection fraction was in the range of 55% to 60%. Wall motion was normal; there were no regional wall motion abnormalities. Features are consistent with a pseudonormal left ventricular filling pattern, with concomitant abnormal relaxation and increased filling pressure (grade 2 diastolic dysfunction). - Left atrium: The atrium was mildly dilated.  Antibiotics: Levaquin 3/9 >>> 3/10  HPI/Subjective:  patient alert and denying any respiratory symptoms. Tells the examining physician "I'm all right"   Objective: Blood pressure 129/69, pulse 86, temperature 99.3 F (37.4 C), temperature source Oral, resp. rate 17, height 5\' 8"  (Calderon.727 m), weight 179 lb 7.3 oz (81.4 kg), SpO2 97.00%.  Intake/Output Summary (Last 24 hours) at 09/26/13 1649 Last data filed at 09/26/13 1544  Gross per 24 hour  Intake    490 ml  Output   3075 ml  Net  -2585 ml     Exam: Patient admitted at 12:36 AM today. Follow up exam completed   Scheduled Meds:  Scheduled Meds: . amLODipine  10 mg Oral Daily  . antiseptic oral rinse  15 mL Mouth Rinse BID  . aspirin EC  81 mg Oral Daily  . calcium acetate  667 mg Oral TID WC  . carvedilol  12.5 mg Oral BID WC  . darbepoetin (ARANESP) injection - NON-DIALYSIS  100 mcg Subcutaneous Q Tue-1800  . furosemide  80 mg Oral BID  . heparin  5,000 Units Subcutaneous 3 times per day  . hydrALAZINE  75 mg Oral 4 times per day  . insulin aspart  0-9 Units Subcutaneous TID WC  . insulin glargine  20 Units Subcutaneous Daily  . isosorbide mononitrate  60 mg Oral  Daily   Continuous Infusions:   Data Reviewed: Basic Metabolic Panel:  Recent Labs Lab 09/26/13 0225  NA 142  K 4.2  CL 103  CO2 19  GLUCOSE 159*  BUN 75*  CREATININE 4.81*  CALCIUM 8.8  MG 2.3  PHOS 5.4*   Liver Function Tests:  Recent Labs Lab 09/26/13 0225  AST 16    ALT 19  ALKPHOS 60  BILITOT 0.5  PROT 7.3  ALBUMIN 2.7*    Recent Labs Lab 09/26/13 0225  AMYLASE 61   No results found for this basename: AMMONIA,  in the last 168 hours CBC:  Recent Labs Lab 09/26/13 0225  WBC 12.4*  NEUTROABS 10.3*  HGB 7.9*  HCT 23.5*  MCV 83.9  PLT 163   Cardiac Enzymes:  Recent Labs Lab 09/26/13 0225 09/26/13 0653 09/26/13 1140  TROPONINI <0.30 <0.30 <0.30   BNP (last 3 results) No results found for this basename: PROBNP,  in the last 8760 hours CBG:  Recent Labs Lab 09/26/13 0042 09/26/13 0350 09/26/13 0830 09/26/13 1114 09/26/13 1545  GLUCAP 141* 200* 136* 193* 192*    Recent Results (from the past 240 hour(s))  MRSA PCR SCREENING     Status: None   Collection Time    09/25/13 11:37 PM      Result Value Ref Range Status   MRSA by PCR NEGATIVE  NEGATIVE Final   Comment:            The GeneXpert MRSA Assay (FDA     approved for NASAL specimens     only), is one component of a     comprehensive MRSA colonization     surveillance program. It is not     intended to diagnose MRSA     infection nor to guide or     monitor treatment for     MRSA infections.     Studies:  Recent x-ray studies have been reviewed in detail by the Attending Physician  Time spent :     Erin Hearing, [place] [facility] Office  [phone] Pager [phone]  **If unable to reach the above provider after paging please contact the [place] @ [phone]  On-Call/Text Page:      [email or web address]      password [email or web address]  If 7PM-7AM, please contact night-coverage [email or web address] Password [email or web address] 09/26/2013, 4:49 PM   LOS: Calderon day   I have examined the patient, reviewed the chart and modified the above note which I agree with.   Dequita Schleicher,MD Pager # on [email or web address] 09/26/2013, 6:55 PM

## 2013-09-26 NOTE — Progress Notes (Signed)
Pt admitted to [facility]M at 2340.  Orders placed prior to admission released and D/C'd.

## 2013-09-26 NOTE — H&P (Signed)
Name: Lucas Calderon MRN: [id number] DOB: Jan 15, 1941    ADMISSION DATE:  09/25/2013  PRIMARY SERVICE:  pccm-->[facility]   CHIEF COMPLAINT:  Dyspnea   BRIEF PATIENT DESCRIPTION:  73 year old male with stage V CKD (BL cr 4.61). Admitted on 3/10 from [facility] w/ 1d h/o increased work of breathing in setting of  asymmetric pulmonary edema following a weekend of dietary indiscretions.  Admitted w/ working dx of volume overload +/- CAP (felt less likely).   SIGNIFICANT EVENTS / STUDIES:  Echo 3/10>>>  LINES / TUBES:   CULTURES: BCX2 3/10>>> U strep 3/10>>> U legionella 3/10>>>   ANTIBIOTICS: levaquin 3/10>>  HISTORY OF PRESENT ILLNESS:   This is a 73 year old male w/ known h/o HTN, CKD stage V,  BL scr 4.61 (just recently had left AVG placed in anticipation of need for HD soon). Presented to the ER at [facility] on 3/9 w/ 1 day h/o progressive shortness of breath. Was in usual state up until that time. Had been at brother's house the day prior where he admitted to large quantities of fried and salty food intake and not adhering to his dietary and fluid restrictions. On presentation to ER he was hypertensive w/ increased work of breathing. CXR showed R>L airspace disease. Transferred to [facility] given concern about possible need for HD.   PAST MEDICAL HISTORY :  Past Medical History  Diagnosis Date  . Anemia   . Diabetes mellitus without complication   . Chronic kidney disease   . Pneumonia   . Hypertension   . GERD (gastroesophageal reflux disease)    Past Surgical History  Procedure Laterality Date  . Appendectomy    . Av fistula placement Left 09/11/2013    Procedure:  CREATION- RADIOCEPHALIC arteriovenous fistula;  Surgeon: Conrad Hackensack, MD;  Location: [place];  Service: Vascular;  Laterality: Left;   Prior to Admission medications   Medication Sig Start Date End Date Taking? Authorizing Provider  albuterol (PROVENTIL HFA) 108 (90 BASE) MCG/ACT inhaler Inhale 2 puffs into the lungs  every 6 (six) hours as needed for wheezing or shortness of breath.    Historical Provider, MD  amLODipine (NORVASC) 10 MG tablet Take 10 mg by mouth daily.    Historical Provider, MD  aspirin 81 MG tablet Take 81 mg by mouth daily.    Historical Provider, MD  b complex vitamins tablet Take 1 tablet by mouth daily.    Historical Provider, MD  carvedilol (COREG) 12.5 MG tablet Take 12.5 mg by mouth 2 (two) times daily with a meal.    Historical Provider, MD  furosemide (LASIX) 20 MG tablet  05/30/13   Historical Provider, MD  HYDRALAZINE HCL PO Take 75 mg by mouth 4 (four) times daily.    Historical Provider, MD  Insulin Glargine (LANTUS [place]) Inject 20 Units into the skin daily.    Historical Provider, MD  isosorbide mononitrate (IMDUR) 60 MG 24 hr tablet Take 60 mg by mouth daily.    Historical Provider, MD  LANTUS SOLOSTAR 100 UNIT/ML Solostar Pen  05/30/13   Historical Provider, MD  losartan-hydrochlorothiazide Konrad Penta) 100-12.5 MG per tablet  07/07/13   Historical Provider, MD  metFORMIN (GLUCOPHAGE-XR) 500 MG 24 hr tablet  05/30/13   Historical Provider, MD  metoprolol succinate (TOPROL-XL) 50 MG 24 hr tablet  07/29/13   Historical Provider, MD  oxyCODONE-acetaminophen (ROXICET) 5-325 MG per tablet Take 1-2 tablets by mouth every 6 (six) hours as needed for moderate pain. 09/11/13  Conrad Coahoma, MD  torsemide (DEMADEX) 20 MG tablet Take 20 mg by mouth daily.    Historical Provider, MD   Allergies  Allergen Reactions  . Penicillins     FAMILY HISTORY:  Family History  Problem Relation Age of Onset  . Diabetes Mother   . Heart disease Mother   . Hypertension Mother   . Diabetes Father   . Hypertension Father   . Peripheral vascular disease Father   . Hypertension Sister    SOCIAL HISTORY:  reports that he has quit smoking. He does not have any smokeless tobacco history on file. He reports that he does not drink alcohol or use illicit drugs.  REVIEW OF SYSTEMS bolds are positive :     Constitutional: Negative for fever, chills, weight loss, malaise/fatigue and diaphoresis.  HENT: Negative for hearing loss, ear pain, nosebleeds, congestion, sore throat, neck pain, tinnitus and ear discharge.   Eyes: Negative for blurred vision, double vision, photophobia, pain, discharge and redness.  Respiratory: Negative for cough, hemoptysis, sputum production, shortness of breath, wheezing and stridor.   Cardiovascular: Negative for chest pain, palpitations, orthopnea, claudication, leg swelling and PND.  Gastrointestinal: Negative for heartburn, nausea, vomiting, abdominal pain, diarrhea, constipation, blood in stool and melena.  Genitourinary: Negative for dysuria, urgency, frequency, hematuria and flank pain.  Musculoskeletal: Negative for myalgias, back pain, joint pain and falls.  Skin: Negative for itching and rash.  Neurological: Negative for dizziness, tingling, tremors, sensory change, speech change, focal weakness, seizures, loss of consciousness, weakness and headaches.  Endo/Heme/Allergies: Negative for environmental allergies and polydipsia. Does not bruise/bleed easily.  SUBJECTIVE:  Feels better after morphine and lasix  VITAL SIGNS: Pulse Rate:  [80-109] 109 (03/10 0015) Resp:  [18-25] 23 (03/10 0015) BP: (148-155)/(69-80) 148/77 mmHg (03/10 0015) SpO2:  [91 %-100 %] 100 % (03/10 0015) Weight:  [81.4 kg (179 lb 7.3 oz)] 81.4 kg (179 lb 7.3 oz) (03/09 2340)  PHYSICAL EXAMINATION: General:  No acute distress  Neuro:  Alert, oriented, no focal def  HEENT:  [place], no JVD Cardiovascular:  III/VI SEM Lungs:  Crackles right > left, no accessory muscle use  Abdomen:  Firm, tympanic to percussion + bowel sounds  Musculoskeletal:  Intact  Skin:  + lower extremity edema   No results found for this basename: NA, K, CL, CO2, BUN, CREATININE, GLUCOSE,  in the last 168 hours No results found for this basename: HGB, HCT, WBC, PLT,  in the last 168 hours No results  found.  ASSESSMENT / PLAN:  Acute respiratory failure in the setting of Right > left pulmonary infiltrates. Most likely reflect asymmetric pulmonary edema, due to ESRD (not yet on HD).  Less likely CAP but on diff dx.  Plan Admit to SDU IV diuresis  Tele BNP, ECHO, cycle CEs Ck PCT Empiric levaquin   Hypertension  Plan Cont home Meds Tele IV diuresis   CKD Plan IV diuresis  BP control  Needs renal consult in am 3/10  DM Plan ssi   Today's summary: still desaturates with movement in bed, but subjectively feeling better with diuresis; will discuss with renal today. Level of care SDU, will ask TRH to assume care 3/10 AM.  Simonne Maffucci [place] [facility] Pager: [phone] Cell: [phone] If no response, call [phone]   09/26/2013, 12:36 AM

## 2013-09-26 NOTE — Progress Notes (Signed)
[facility] PCCM  Still having some desaturation with minimal movement; increased work of breathing despite his claim to feel better. Will call Renal, check CXR, get ABG now  Jillyn Hidden PCCM Pager: [phone] Cell: [phone] If no response, call [phone]

## 2013-09-26 NOTE — Progress Notes (Addendum)
Antibiotics d/c shortly after being ordered. Pt only received Levaquin x 1 dose.  ANTIBIOTIC CONSULT NOTE - INITIAL  Pharmacy Consult for Vancomycin, Aztreonam, Levaquin Indication: suspected HCAP  Allergies  Allergen Reactions  . Penicillins     Patient Measurements: Height: 5\' 8"  (172.7 cm) Weight: 179 lb 7.3 oz (81.4 kg) IBW/kg (Calculated) : 68.4  Vital Signs: Temp: 99 F (37.2 C) (03/10 0353) Temp src: Oral (03/10 0353) BP: 155/79 mmHg (03/10 0600) Pulse Rate: 90 (03/10 0600) Intake/Output from previous day: 03/09 0701 - 03/10 0700 In: 250 [IV Piggyback:250] Out: 1750 [Urine:1750] Intake/Output from this shift: Total I/O In: -  Out: 100 [Urine:100]  Labs:  Recent Labs  09/26/13 0225  WBC 12.4*  HGB 7.9*  PLT 163  CREATININE 4.81*   Estimated Creatinine Clearance: 13.8 ml/min (by C-G formula based on Cr of 4.81). No results found for this basename: VANCOTROUGH, [name], VANCORANDOM, [place], GENTPEAK, GENTRANDOM, TOBRATROUGH, TOBRAPEAK, TOBRARND, AMIKACINPEAK, AMIKACINTROU, AMIKACIN,  in the last 72 hours   Microbiology: Recent Results (from the past 720 hour(s))  MRSA PCR SCREENING     Status: None   Collection Time    09/25/13 11:37 PM      Result Value Ref Range Status   MRSA by PCR NEGATIVE  NEGATIVE Final   Comment:            The GeneXpert MRSA Assay (FDA     approved for NASAL specimens     only), is one component of a     comprehensive MRSA colonization     surveillance program. It is not     intended to diagnose MRSA     infection nor to guide or     monitor treatment for     MRSA infections.    Medical History: Past Medical History  Diagnosis Date  . Anemia   . Diabetes mellitus without complication   . Chronic kidney disease   . Pneumonia   . Hypertension   . GERD (gastroesophageal reflux disease)     Medications:  See electronic med rec  Assessment: 73 y.o. male admitted 3/10 from [facility] with SOB. Pt with stage V  CKD (baseline SCr 4.61), est CrCl 13 ml/min.  Noted recent placement of AVG in anticipation of HD soon. Pt to start broad spectrum antibiotics for possible HCAP (levaquin, vancomycin, aztreonam). Cultures pending.  Goal of Therapy:  Vancomycin trough level 15-20 mcg/ml  Plan:  1) Vancomycin 1750 mg IV now then 1gm IV q48h 2) Aztreonam 2gm IV now then 500mg  IV q8h 3) Levaquin 750mg  IV already given then 500mg  IV q48h 4) Will f/u plans for HD initiation, micro data, pt's clinical condition 5) Vanc trough prn  [name], PharmD, BCPS Clinical pharmacist, pager [phone] 09/26/2013,8:24 AM

## 2013-09-26 NOTE — Progress Notes (Signed)
[facility] Progress Note Patient Name: Lucas Calderon DOB: 1941/07/20 MRN: [id number]  Date of Service  09/26/2013   HPI/Events of Note  Hyperglycemia with blood sugar in the 200s in the setting of renal insufficiency.  eICU Interventions  Plan: For now will place on q4 hour SSI renal dosing Plans for lantus/SSI when taking pos to be determined on AM rounds   Intervention Category Intermediate Interventions: Hyperglycemia - evaluation and treatment  DETERDING,ELIZABETH 09/26/2013, 4:58 AM

## 2013-09-26 NOTE — Consult Note (Signed)
Lucas Calderon is an 73 y.o. male referred by Dr Lake Bells   Chief Complaint: CKD HPI: 73yo BM with CKD 5 with baseline Scr in the 4's admitted yest from [facility] were he presented SOB.  CXR shows Rt sided infiltrate.  He is SP placement of AVF on 09/11/13.  Past Medical History  Diagnosis Date  . Anemia   . Diabetes mellitus without complication   . Chronic kidney disease   . Pneumonia   . Hypertension   . GERD (gastroesophageal reflux disease)     Past Surgical History  Procedure Laterality Date  . Appendectomy    . Av fistula placement Left 09/11/2013    Procedure:  CREATION- RADIOCEPHALIC arteriovenous fistula;  Surgeon: Conrad Greensburg, MD;  Location: [facility];  Service: Vascular;  Laterality: Left;    Family History  Problem Relation Age of Onset  . Diabetes Mother   . Heart disease Mother   . Hypertension Mother   . Diabetes Father   . Hypertension Father   . Peripheral vascular disease Father   . Hypertension Sister   neg for renal ds  Social History:  reports that he has quit smoking. He does not have any smokeless tobacco history on file. He reports that he does not drink alcohol or use illicit drugs. Lives with niece in [place]  Allergies:  Allergies  Allergen Reactions  . Penicillins     Medications Prior to Admission  Medication Sig Dispense Refill  . albuterol (PROVENTIL HFA) 108 (90 BASE) MCG/ACT inhaler Inhale 2 puffs into the lungs every 6 (six) hours as needed for wheezing or shortness of breath.      Marland Kitchen amLODipine (NORVASC) 10 MG tablet Take 10 mg by mouth daily.      Marland Kitchen aspirin 81 MG tablet Take 81 mg by mouth daily.      Marland Kitchen b complex vitamins tablet Take 1 tablet by mouth daily.      . carvedilol (COREG) 12.5 MG tablet Take 12.5 mg by mouth 2 (two) times daily with a meal.      . furosemide (LASIX) 20 MG tablet       . HYDRALAZINE HCL PO Take 75 mg by mouth 4 (four) times daily.      . Insulin Glargine (LANTUS [place]) Inject 20 Units into the skin daily.       . isosorbide mononitrate (IMDUR) 60 MG 24 hr tablet Take 60 mg by mouth daily.      Marland Kitchen LANTUS SOLOSTAR 100 UNIT/ML Solostar Pen       . losartan-hydrochlorothiazide (HYZAAR) 100-12.5 MG per tablet       . metFORMIN (GLUCOPHAGE-XR) 500 MG 24 hr tablet       . metoprolol succinate (TOPROL-XL) 50 MG 24 hr tablet       . oxyCODONE-acetaminophen (ROXICET) 5-325 MG per tablet Take 1-2 tablets by mouth every 6 (six) hours as needed for moderate pain.  30 tablet  0  . torsemide (DEMADEX) 20 MG tablet Take 20 mg by mouth daily.         Lab Results: UA: ND   Recent Labs  09/26/13 0225  WBC 12.4*  HGB 7.9*  HCT 23.5*  PLT 163   BMET  Recent Labs  09/26/13 0225  NA 142  K 4.2  CL 103  CO2 19  GLUCOSE 159*  BUN 75*  CREATININE 4.81*  CALCIUM 8.8  PHOS 5.4*   LFT  Recent Labs  09/26/13 0225  PROT 7.3  ALBUMIN  2.7*  AST 16  ALT 19  ALKPHOS 60  BILITOT 0.5   Dg Chest Port 1 View  09/26/2013   CLINICAL DATA:  Shortness of breath, cough, congestion  EXAM: PORTABLE CHEST - 1 VIEW  COMPARISON:  Chest x-ray of 09/25/2013  FINDINGS: There has been worsening of airspace disease throughout the right lung with sparing of the left lung. This pattern is most consistent with pneumonia. No definite effusion is seen. The heart is mildly enlarged and stable.  IMPRESSION: Worsening of right lung airspace disease most consistent with pneumonia.   Electronically Signed   By: Ivar Drape M.D.   On: 09/26/2013 08:17    ROS: no change in vision + SOB No CP No abd pain No jt pain No neuropathic Sxs Appetite has been good, No N/V  PHYSICAL EXAM: Blood pressure 155/79, pulse 94, temperature 98.1 F (36.7 C), temperature source Oral, resp. rate 20, height 5\' 8"  (1.727 m), weight 81.4 kg (179 lb 7.3 oz), SpO2 91.00%. HEENT: PERRLA EOMI  [place] in place NECK:No JVD LUNGS:Decreased BS on Rt, with crackles, rhonchi and bronchial BS CARDIAC:RRR wo MRG ABD:+ BS Protuberant, NTND No HSM EXT:no edema   Lt avf + bruit NEURO:CNI M&SI Ox3 no asterixis  Assessment: 1. Rt PNA 2. CKD 4/5.  Renal fx at baseline and he has not had any uremic Sxs 3. Anemia 4. HTN 5. DM PLAN: 1 Switch to PO lasix 2. Check PTH and Iron studies 3. Start aranesp 4. Dc losartan 5 renal diet 6. Daily Scr 7. phoslo 1 AC    Miyako Oelke T 09/26/2013, 10:59 AM

## 2013-09-26 NOTE — Progress Notes (Signed)
  Echocardiogram 2D Echocardiogram has been performed.  Arienne Gartin, [place] 09/26/2013, 10:16 AM

## 2013-09-27 DIAGNOSIS — N185 Chronic kidney disease, stage 5: Secondary | ICD-10-CM

## 2013-09-27 LAB — GLUCOSE, CAPILLARY
GLUCOSE-CAPILLARY: 170 mg/dL — AB (ref 70–99)
Glucose-Capillary: 103 mg/dL — ABNORMAL HIGH (ref 70–99)
Glucose-Capillary: 107 mg/dL — ABNORMAL HIGH (ref 70–99)
Glucose-Capillary: 179 mg/dL — ABNORMAL HIGH (ref 70–99)

## 2013-09-27 LAB — RENAL FUNCTION PANEL
Albumin: 2.2 g/dL — ABNORMAL LOW (ref 3.5–5.2)
BUN: 81 mg/dL — ABNORMAL HIGH (ref 6–23)
CO2: 21 meq/L (ref 19–32)
Calcium: 9 mg/dL (ref 8.4–10.5)
Chloride: 103 mEq/L (ref 96–112)
Creatinine, Ser: 5.19 mg/dL — ABNORMAL HIGH (ref 0.50–1.35)
GFR calc Af Amer: 12 mL/min — ABNORMAL LOW (ref >90)
GFR, EST NON AFRICAN AMERICAN: 10 mL/min — AB (ref >90)
Glucose, Bld: 104 mg/dL — ABNORMAL HIGH (ref 70–99)
Phosphorus: 6.1 mg/dL — ABNORMAL HIGH (ref 2.3–4.6)
Potassium: 4.1 mEq/L (ref 3.7–5.3)
Sodium: 141 mEq/L (ref 137–147)

## 2013-09-27 LAB — CBC
HCT: 20.7 % — ABNORMAL LOW (ref 39.0–52.0)
HEMOGLOBIN: 7 g/dL — AB (ref 13.0–17.0)
MCH: 28.3 pg (ref 26.0–34.0)
MCHC: 33.8 g/dL (ref 30.0–36.0)
MCV: 83.8 fL (ref 78.0–100.0)
Platelets: 165 10*3/uL (ref 150–400)
RBC: 2.47 MIL/uL — AB (ref 4.22–5.81)
RDW: 15.2 % (ref 11.5–15.5)
WBC: 11.6 10*3/uL — ABNORMAL HIGH (ref 4.0–10.5)

## 2013-09-27 LAB — PARATHYROID HORMONE, INTACT (NO CA): PTH: 150.7 pg/mL — AB (ref 14.0–72.0)

## 2013-09-27 MED ORDER — FUROSEMIDE 80 MG PO TABS
80.0000 mg | ORAL_TABLET | Freq: Every day | ORAL | Status: DC
  Administered 2013-09-27 – 2013-09-30 (×4): 80 mg via ORAL
  Filled 2013-09-27 (×4): qty 1

## 2013-09-27 NOTE — Clinical Documentation Improvement (Signed)
Presents with volume overload, stage V CKD, Acute Respiratory Failure, Pulmonary Edema.   Renal documenting right Pneumonia  History of Chronic Diastolic CHF  Being treated with both IV and PO Lasix, was on IV Azactam and IV Levaquin  Please clarify the likely etiology of patient's Volume Overload   Stage V CKD  Acute Pulmonary Edema  Acute on Chronic Diastolic CHF  Pneumonia  Combination of any of the above diagnoses using the word and  E.G. Volume overload likely secondary to: ________ and ____________  This query is being asked to clarify an accurate Principle Diagnosis. Each of the above diagnoses codes to a different DRG.   Thank You, Lucas Calderon ,RN Clinical Documentation Specialist:  [place] Information Management

## 2013-09-27 NOTE — Progress Notes (Signed)
NURSING PROGRESS NOTE  Lucas Calderon [id number] Transfer Data: 09/27/2013 7:50 PM Attending Provider: Debbe Odea, MD [id number], MD Code Status: full  Lucas Calderon is a 73 y.o. male patient transferred from [facility] -No acute distress noted.  -No complaints of shortness of breath.  -No complaints of chest pain.   Cardiac Monitoring: Box #19 in-place  Blood pressure 155/71, pulse 86, temperature 99.1 F (37.3 C), temperature source Oral, resp. rate 18, height 5\' 8"  (1.727 m), weight 78.608 kg (173 lb 4.8 oz), SpO2 95.00%.   IV Fluids:  IV in place, occlusive dsg intact without redness, IV cath R ac Saline Locked.   Allergies:  Penicillins  Past Medical History:   has a past medical history of Anemia; Diabetes mellitus without complication; Chronic kidney disease; Pneumonia; Hypertension; and GERD (gastroesophageal reflux disease).  Past Surgical History:   has past surgical history that includes Appendectomy and AV fistula placement (Left, 09/11/2013).  Social History:   reports that he has quit smoking. He does not have any smokeless tobacco history on file. He reports that he does not drink alcohol or use illicit drugs.  Skin: Intact  Patient/Family orientated to room. Admission inpatient armband information verified with patient/family to include name and date of birth and placed on patient arm. Side rails up x 2, fall assessment and education completed with patient/family. Patient/family able to verbalize understanding of risk associated with falls and verbalized understanding to call for assistance before getting out of bed. Call light within reach. Patient/family able to voice and demonstrate understanding of unit orientation instructions.    Will continue to evaluate and treat per MD orders.

## 2013-09-27 NOTE — Progress Notes (Addendum)
S: Feels better.  Less SOB O:BP 123/58  Pulse 78  Temp(Src) 98.8 F (37.1 C) (Oral)  Resp 17  Ht 5\' 8"  (1.727 m)  Wt 80 kg (176 lb 5.9 oz)  BMI 26.82 kg/m2  SpO2 97%  Intake/Output Summary (Last 24 hours) at 09/27/13 0730 Last data filed at 09/27/13 0600  Gross per 24 hour  Intake    720 ml  Output   2850 ml  Net  -2130 ml   Weight change: -1.4 kg (-3 lb 1.4 oz) [id number] and alert CVS:RRR Resp:decreased BS on Rt with scattered rhonchi Abd:+ BS NTND Ext:no edema  Lt AVF + bruit NEURO:CNI Ox3 No asterixis   . amLODipine  10 mg Oral Daily  . antiseptic oral rinse  15 mL Mouth Rinse BID  . aspirin EC  81 mg Oral Daily  . calcium acetate  667 mg Oral TID WC  . carvedilol  12.5 mg Oral BID WC  . darbepoetin (ARANESP) injection - NON-DIALYSIS  100 mcg Subcutaneous Q Tue-1800  . furosemide  80 mg Oral BID  . heparin  5,000 Units Subcutaneous 3 times per day  . hydrALAZINE  75 mg Oral 4 times per day  . insulin aspart  0-5 Units Subcutaneous QHS  . insulin aspart  0-9 Units Subcutaneous TID WC  . insulin glargine  20 Units Subcutaneous Daily  . isosorbide mononitrate  60 mg Oral Daily   Dg Chest Port 1 View  09/26/2013   CLINICAL DATA:  Shortness of breath, cough, congestion  EXAM: PORTABLE CHEST - 1 VIEW  COMPARISON:  Chest x-ray of 09/25/2013  FINDINGS: There has been worsening of airspace disease throughout the right lung with sparing of the left lung. This pattern is most consistent with pneumonia. No definite effusion is seen. The heart is mildly enlarged and stable.  IMPRESSION: Worsening of right lung airspace disease most consistent with pneumonia.   Electronically Signed   By: Ivar Drape M.D.   On: 09/26/2013 08:17   BMET    Component Value Date/Time   NA 141 09/27/2013 0256   K 4.1 09/27/2013 0256   CL 103 09/27/2013 0256   CO2 21 09/27/2013 0256   GLUCOSE 104* 09/27/2013 0256   BUN 81* 09/27/2013 0256   CREATININE 5.19* 09/27/2013 0256   CALCIUM 9.0 09/27/2013 0256    GFRNONAA 10* 09/27/2013 0256   GFRAA 12* 09/27/2013 0256   CBC    Component Value Date/Time   WBC 11.6* 09/27/2013 0256   RBC 2.47* 09/27/2013 0256   HGB 7.0* 09/27/2013 0256   HCT 20.7* 09/27/2013 0256   PLT 165 09/27/2013 0256   MCV 83.8 09/27/2013 0256   MCH 28.3 09/27/2013 0256   MCHC 33.8 09/27/2013 0256   RDW 15.2 09/27/2013 0256   LYMPHSABS 1.2 09/26/2013 0225   MONOABS 0.8 09/26/2013 0225   EOSABS 0.0 09/26/2013 0225   BASOSABS 0.0 09/26/2013 0225     Assessment: 1. Rt PNA 2. 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 Anemia, on aranesp.  HG down to 7.0 4. HTN 5. DM  Plan: 1. Cont AB 2. Decrease lasix to q d 3. Daily Scr.  Recheck HG 4. Await PTH level   Katalena Malveaux T

## 2013-09-27 NOTE — Progress Notes (Signed)
Lucas Calderon [id number] DOB: 23-Apr-1941 DOA: 09/25/2013 PCP: Maryella Shivers, MD  Brief narrative: 73 year old male patient with stage V chronic kidney disease. Transfer from [facility] on 09/26/2013 with a one-day history of increased work of breathing in setting of asymmetric pulmonary edema. The patient endorsed that over the past weekend he had significant dietary indiscretions including high sodium intake. He was admitted with pulmonary edema and volume overload in setting of severe chronic kidney disease. Community acquired pneumonias in the differential as well but felt to be less likely.  Assessment/Plan: Active Problems:   CKD (chronic kidney disease), stage V -Per nephrology -Patient has recent fistula placement February 2015 but no indications to initiate dialysis at this time -Nephrology has changed to oral Lasix and had discontinued losartan and begun PhosLo - -3400 cc since admission    Pulmonary edema/Acute respiratory failure with hypoxia -Precipitated by dietary indiscretion in patient with severe kidney disease -Still requiring oxygen at relatively high levels the patient reports previous symptoms have resolved -diuresing adequately -wean oxygen -2V CXR in am    HTN (hypertension)/grade 2 chronic diastolic heart failure -Echocardiogram this admission shows a new finding of grade 2 diastolic dysfunction -Continue Norvasc, Apresoline, Imdur and Coreg - Nephrology has dc'd previous losartan    Anemia in CKD (chronic kidney disease) -Nephrology has started Aranesp this admission    Diabetes mellitus, type 2 -Current CBGs well controlled -Continue Lantus   DVT prophylaxis: subcutaneous heparin  Code Status:  full  Family Communication: no family at bedside  Disposition Plan/Expected LOS: Transfer to floor- mobilize   Consultants:  nephrology   pccm  Procedures: 2-D echocardiogram  - Left  ventricle: The cavity size was normal. Wall thickness was increased in a pattern of mild LVH. Systolic function was normal. The estimated ejection fraction was in the range of 55% to 60%. Wall motion was normal; there were no regional wall motion abnormalities. Features are consistent with a pseudonormal left ventricular filling pattern, with concomitant abnormal relaxation and increased filling pressure (grade 2 diastolic dysfunction). - Left atrium: The atrium was mildly dilated.  Antibiotics: Levaquin 3/9 >>> 3/10  HPI/Subjective:  patient alert and denying any respiratory symptoms. Tells the examining physician "I'm all right"   Objective: Blood pressure 144/65, pulse 92, temperature 98.7 F (37.1 C), temperature source Oral, resp. rate 29, height 5\' 8"  (1.727 m), weight 176 lb 5.9 oz (80 kg), SpO2 94.00%.  Intake/Output Summary (Last 24 hours) at 09/27/13 1424 Last data filed at 09/27/13 1300  Gross per 24 hour  Intake   1320 ml  Output   2450 ml  Net  -1130 ml     Exam: General: No acute respiratory distress-dyspneic with exertion Lungs: Coarse crackles right base, 3 L Cardiovascular: Regular rate and rhythm without murmur gallop or rub normal S1 and S2, no peripheral edema or JVD Abdomen: Nontender, nondistended, soft, bowel sounds positive, no rebound, no ascites, no appreciable mass Musculoskeletal: No significant cyanosis, clubbing of bilateral lower extremities Neurological: Alert and oriented x 3, moves all extremities x 4 without focal neurological deficits, CN 2-12 intact  Scheduled Meds:  Scheduled Meds: . amLODipine  10 mg Oral Daily  . antiseptic oral rinse  15 mL Mouth Rinse BID  . aspirin EC  81 mg Oral Daily  . calcium acetate  667 mg Oral TID WC  . carvedilol  12.5 mg Oral BID WC  . darbepoetin (ARANESP) injection - NON-DIALYSIS  100 mcg Subcutaneous Q Tue-1800  . furosemide  80 mg Oral Daily  . heparin  5,000 Units Subcutaneous 3 times per day  .  hydrALAZINE  75 mg Oral 4 times per day  . insulin aspart  0-5 Units Subcutaneous QHS  . insulin aspart  0-9 Units Subcutaneous TID WC  . insulin glargine  20 Units Subcutaneous Daily  . isosorbide mononitrate  60 mg Oral Daily   Continuous Infusions:   Data Reviewed: Basic Metabolic Panel:  Recent Labs Lab 09/26/13 0225 09/27/13 0256  NA 142 141  K 4.2 4.1  CL 103 103  CO2 19 21  GLUCOSE 159* 104*  BUN 75* 81*  CREATININE 4.81* 5.19*  CALCIUM 8.8 9.0  MG 2.3  --   PHOS 5.4* 6.1*   Liver Function Tests:  Recent Labs Lab 09/26/13 0225 09/27/13 0256  AST 16  --   ALT 19  --   ALKPHOS 60  --   BILITOT 0.5  --   PROT 7.3  --   ALBUMIN 2.7* 2.2*    Recent Labs Lab 09/26/13 0225  AMYLASE 61   No results found for this basename: AMMONIA,  in the last 168 hours CBC:  Recent Labs Lab 09/26/13 0225 09/27/13 0256  WBC 12.4* 11.6*  NEUTROABS 10.3*  --   HGB 7.9* 7.0*  HCT 23.5* 20.7*  MCV 83.9 83.8  PLT 163 165   Cardiac Enzymes:  Recent Labs Lab 09/26/13 0225 09/26/13 0653 09/26/13 1140  TROPONINI <0.30 <0.30 <0.30   BNP (last 3 results) No results found for this basename: PROBNP,  in the last 8760 hours CBG:  Recent Labs Lab 09/26/13 0830 09/26/13 1114 09/26/13 1545 09/26/13 2142 09/27/13 0844  GLUCAP 136* 193* 192* 106* 103*    Recent Results (from the past 240 hour(s))  MRSA PCR SCREENING     Status: None   Collection Time    09/25/13 11:37 PM      Result Value Ref Range Status   MRSA by PCR NEGATIVE  NEGATIVE Final   Comment:            The GeneXpert MRSA Assay (FDA     approved for NASAL specimens     only), is one component of a     comprehensive MRSA colonization     surveillance program. It is not     intended to diagnose MRSA     infection nor to guide or     monitor treatment for     MRSA infections.  URINE CULTURE     Status: None   Collection Time    09/26/13  1:45 AM      Result Value Ref Range Status   Specimen  Description URINE, RANDOM   Final   Special Requests NONE   Final   Culture  Setup Time     Final   Value: 09/26/2013 08:59     Performed at [facility] PENDING   Incomplete   Culture     Final   Value: Culture reincubated for better growth     Performed at [facility]   Report Status PENDING   Incomplete  CULTURE, BLOOD (ROUTINE X 2)     Status: None   Collection Time    09/26/13  2:25 AM      Result Value Ref Range Status   Specimen Description BLOOD RIGHT HAND   Final   Special Requests BOTTLES DRAWN AEROBIC ONLY 5CC  Final   Culture  Setup Time     Final   Value: 09/26/2013 08:51     Performed at [facility]   Culture     Final   Value:        BLOOD CULTURE RECEIVED NO GROWTH TO DATE CULTURE WILL BE HELD FOR 5 DAYS BEFORE ISSUING A FINAL NEGATIVE REPORT     Performed at [facility]   Report Status PENDING   Incomplete  CULTURE, BLOOD (ROUTINE X 2)     Status: None   Collection Time    09/26/13  2:31 AM      Result Value Ref Range Status   Specimen Description BLOOD RIGHT FOREARM   Final   Special Requests BOTTLES DRAWN AEROBIC ONLY 5CC   Final   Culture  Setup Time     Final   Value: 09/26/2013 08:51     Performed at [facility]   Culture     Final   Value:        BLOOD CULTURE RECEIVED NO GROWTH TO DATE CULTURE WILL BE HELD FOR 5 DAYS BEFORE ISSUING A FINAL NEGATIVE REPORT     Performed at [facility]   Report Status PENDING   Incomplete     Studies:  Recent x-ray studies have been reviewed in detail by the Attending Physician  Time spent :     Erin Hearing, [place] [facility] Office  [phone] Pager [phone]  **If unable to reach the above provider after paging please contact the [place] @ [phone]  On-Call/Text Page:      [email or web address]      password [email or web address]  If 7PM-7AM, please contact night-coverage [email or web address] Password [facility] 09/27/2013, 2:24 PM   LOS: 2 days   I have  examined the patient, reviewed the chart and modified the above note which I agree with.   Vyctoria Dickman,MD Pager # on [email or web address] 09/27/2013, 4:03 PM

## 2013-09-27 NOTE — Progress Notes (Signed)
Report called to receiving nurse, Joellen Jersey [phone]. All past medical history and present hospitalization course reported to receiving nurse [phone]. All questions answered. All patient belongings home with sister on 09/26/13. Sister, Aileen Fass called to notify of transfer. [phone] (home) message left to return call. [phone] (cell) unable to leave message, patient made aware. Transported via wheelchair.

## 2013-09-27 NOTE — Progress Notes (Addendum)
Report called to receiving nurse 2C12 Past medical history and present hospitalization progression reported to receiving nurse.   All questions answered. Phoned sister to make aware of transfer. Cell [phone] (message not in range). Home number [phone] message left. Patient aware. All belongings home with sister on 09/26/12.  Patient transfer changed by NP to med-surg. Will continue to monitor patient. Patient and family notified.

## 2013-09-28 ENCOUNTER — Inpatient Hospital Stay (HOSPITAL_COMMUNITY): Payer: Medicare Other

## 2013-09-28 DIAGNOSIS — N189 Chronic kidney disease, unspecified: Secondary | ICD-10-CM

## 2013-09-28 DIAGNOSIS — J189 Pneumonia, unspecified organism: Secondary | ICD-10-CM

## 2013-09-28 DIAGNOSIS — N039 Chronic nephritic syndrome with unspecified morphologic changes: Secondary | ICD-10-CM

## 2013-09-28 DIAGNOSIS — D631 Anemia in chronic kidney disease: Secondary | ICD-10-CM

## 2013-09-28 LAB — RENAL FUNCTION PANEL
Albumin: 2.2 g/dL — ABNORMAL LOW (ref 3.5–5.2)
BUN: 85 mg/dL — AB (ref 6–23)
CO2: 21 meq/L (ref 19–32)
Calcium: 9 mg/dL (ref 8.4–10.5)
Chloride: 103 mEq/L (ref 96–112)
Creatinine, Ser: 5.6 mg/dL — ABNORMAL HIGH (ref 0.50–1.35)
GFR calc Af Amer: 11 mL/min — ABNORMAL LOW (ref >90)
GFR calc non Af Amer: 9 mL/min — ABNORMAL LOW (ref >90)
GLUCOSE: 72 mg/dL (ref 70–99)
PHOSPHORUS: 5.4 mg/dL — AB (ref 2.3–4.6)
POTASSIUM: 4.1 meq/L (ref 3.7–5.3)
Sodium: 142 mEq/L (ref 137–147)

## 2013-09-28 LAB — CBC
HCT: 21.1 % — ABNORMAL LOW (ref 39.0–52.0)
HEMOGLOBIN: 7.1 g/dL — AB (ref 13.0–17.0)
MCH: 28.4 pg (ref 26.0–34.0)
MCHC: 33.6 g/dL (ref 30.0–36.0)
MCV: 84.4 fL (ref 78.0–100.0)
Platelets: 171 10*3/uL (ref 150–400)
RBC: 2.5 MIL/uL — ABNORMAL LOW (ref 4.22–5.81)
RDW: 15 % (ref 11.5–15.5)
WBC: 9 10*3/uL (ref 4.0–10.5)

## 2013-09-28 LAB — ABO/RH: ABO/RH(D): O POS

## 2013-09-28 LAB — GLUCOSE, CAPILLARY
Glucose-Capillary: 82 mg/dL (ref 70–99)
Glucose-Capillary: 159 mg/dL — ABNORMAL HIGH (ref 70–99)
Glucose-Capillary: 103 mg/dL — ABNORMAL HIGH (ref 70–99)
Glucose-Capillary: 117 mg/dL — ABNORMAL HIGH (ref 70–99)

## 2013-09-28 LAB — PREPARE RBC (CROSSMATCH)

## 2013-09-28 MED ORDER — LEVOFLOXACIN 750 MG PO TABS
750.0000 mg | ORAL_TABLET | Freq: Once | ORAL | Status: DC
  Filled 2013-09-28: qty 1

## 2013-09-28 MED ORDER — ACETAMINOPHEN 325 MG PO TABS
325.0000 mg | ORAL_TABLET | Freq: Four times a day (QID) | ORAL | Status: DC | PRN
  Administered 2013-09-28: 325 mg via ORAL
  Filled 2013-09-28: qty 1

## 2013-09-28 MED ORDER — LEVOFLOXACIN 500 MG PO TABS: 500.0000 mg | ORAL_TABLET | ORAL | Status: DC

## 2013-09-28 MED ORDER — IPRATROPIUM-ALBUTEROL 0.5-2.5 (3) MG/3ML IN SOLN
3.0000 mL | Freq: Three times a day (TID) | RESPIRATORY_TRACT | Status: DC
  Administered 2013-09-28 – 2013-09-30 (×7): 3 mL via RESPIRATORY_TRACT
  Filled 2013-09-28 (×7): qty 3

## 2013-09-28 MED ORDER — ALBUTEROL SULFATE (2.5 MG/3ML) 0.083% IN NEBU: 2.5000 mg | INHALATION_SOLUTION | RESPIRATORY_TRACT | Status: DC | PRN

## 2013-09-28 MED ORDER — CALCITRIOL 0.25 MCG PO CAPS
0.2500 ug | ORAL_CAPSULE | Freq: Every day | ORAL | Status: DC
  Administered 2013-09-28 – 2013-09-30 (×3): 0.25 ug via ORAL
  Filled 2013-09-28 (×3): qty 1

## 2013-09-28 MED ORDER — IPRATROPIUM-ALBUTEROL 0.5-2.5 (3) MG/3ML IN SOLN
3.0000 mL | RESPIRATORY_TRACT | Status: DC
  Administered 2013-09-28: 3 mL via RESPIRATORY_TRACT
  Filled 2013-09-28: qty 3

## 2013-09-28 MED ORDER — LEVOFLOXACIN 500 MG PO TABS
500.0000 mg | ORAL_TABLET | ORAL | Status: DC
  Administered 2013-09-28 – 2013-09-30 (×2): 500 mg via ORAL
  Filled 2013-09-28 (×2): qty 1

## 2013-09-28 MED ORDER — GUAIFENESIN ER 600 MG PO TB12
600.0000 mg | ORAL_TABLET | Freq: Once | ORAL | Status: AC
Start: 2013-09-28 — End: 2013-09-28
  Administered 2013-09-28: 600 mg via ORAL
  Filled 2013-09-28 (×2): qty 1

## 2013-09-28 NOTE — Progress Notes (Signed)
PROGRESS NOTE  Lucas Calderon [id number] DOB: October 18, 1940 DOA: 09/25/2013 PCP: Maryella Shivers, MD  HPI/Subjective: Lucas Calderon is a 73 yo African American male with stage V CKD. He was transferred from [facility] on 09/26/2013 with a one day history of worsening dyspnea in the setting of asymmetric pulmonary edema and volume overload in the setting of severe CKD and dietary indiscretions.   Today (3/12) patient states he "feels fine" and denies any difficulty breathing. He says his SOB is much better and denies any chest pain or edema. He states it is easier to breathe when he is sitting up. These reported symptoms are inconsistent with physical exam findings.    Assessment/Plan:  Community Acquired Pneumonia -CXR on 3/12 consistent with worsening PNA  -Pt given one dose of Levaquin on 3/10 -Continue Levaquin 750mg  every 48 hours -Continue to wean oxygen as tolerated; Currently stats >90% on 3 L -Cough incentive spirometry Q4 hrs while awake  CKD (chronic kidney disease), stage V  -Appreciate nephrology consult by Dr. Mercy Moore  -Patient has recent fistula placement February 2015 but no indications to initiate dialysis at this time  -Discontinue losartan per nephrology -Continue oral lasix and phoslo -Continue daily weights (down 3lb since yesterday) and strict I&O - (4,710 cc) since admission   Pulmonary edema/Acute respiratory failure with hypoxia  -Precipitated by dietary indiscretion in patient with severe kidney disease  -Still requiring oxygen at 3L; the patient reports previous symptoms have resolved  -Diuresing adequately -continue to wean oxygen  -CXR demonstrates diffuse right lung infiltrate consistent with PNA  HTN (hypertension)/grade 2 chronic diastolic heart failure  -Echocardiogram this admission shows a new finding of grade 2 diastolic dysfunction  -BP has been stable -Continue Norvasc, Apresoline, Imdur and Coreg  -Nephrology has dc'd previous  losartan   Anemia in CKD (chronic kidney disease)  -Nephrology has started Aranesp this admission  -Will transfuse 1 unit PRBC on 3/12  Diabetes mellitus, type 2  -Current CBGs well controlled  -SSI -Continue Lantus   DVT Prophylaxis:  Heprin  Code Status: Full Family Communication: Patient alert, oriented and aware of plan. No family members present at this time. Disposition Plan: Home   Consultants:  Dr. Mercy Moore of nephrology  Procedures:  None  Antibiotics:  Levaquin  Objective: Filed Vitals:   09/27/13 1816 09/27/13 2118 09/28/13 0504 09/28/13 1017  BP: 155/71 138/63 148/71   Pulse: 86 79 81   Temp: 99.1 F (37.3 C) 99.8 F (37.7 C) 98.9 F (37.2 C)   TempSrc: Oral Oral Oral   Resp: 18 20 20    Height: 5\' 8"  (1.727 m)     Weight: 78.608 kg (173 lb 4.8 oz)  77.701 kg (171 lb 4.8 oz)   SpO2: 95% 93% 94% 94%    Intake/Output Summary (Last 24 hours) at 09/28/13 1118 Last data filed at 09/28/13 0505  Gross per 24 hour  Intake    840 ml  Output   2100 ml  Net  -1260 ml   Filed Weights   09/27/13 0600 09/27/13 1816 09/28/13 0504  Weight: 80 kg (176 lb 5.9 oz) 78.608 kg (173 lb 4.8 oz) 77.701 kg (171 lb 4.8 oz)    Exam: General: Well developed, well nourished, in mild distress, appears stated age  HEENT:  PERR, EOMI, dry mucous membranes.  Neck: Supple, no JVD, no masses, no lymphadenopathy  Cardiovascular: RRR, S1 S2 auscultated, no rubs, murmurs or gallops.   Respiratory: Decreased breath sounds with scattered rhonchi worse on the right  Abdomen: Soft, nontender, nondistended, + bowel sounds  Extremities: warm dry without cyanosis clubbing or edema.  Neuro: AAOx3, cranial nerves grossly intact. Strength 5/5 in upper and lower extremities  Skin: Without rashes exudates or nodules.   Psych: Normal affect and demeanor with intact judgement and insight   Data Reviewed: Basic Metabolic Panel:  Recent Labs Lab 09/26/13 0225 09/27/13 0256  09/28/13 0431  NA 142 141 142  K 4.2 4.1 4.1  CL 103 103 103  CO2 19 21 21   GLUCOSE 159* 104* 72  BUN 75* 81* 85*  CREATININE 4.81* 5.19* 5.60*  CALCIUM 8.8 9.0 9.0  MG 2.3  --   --   PHOS 5.4* 6.1* 5.4*   Liver Function Tests:  Recent Labs Lab 09/26/13 0225 09/27/13 0256 09/28/13 0431  AST 16  --   --   ALT 19  --   --   ALKPHOS 60  --   --   BILITOT 0.5  --   --   PROT 7.3  --   --   ALBUMIN 2.7* 2.2* 2.2*    Recent Labs Lab 09/26/13 0225  AMYLASE 61   CBC:  Recent Labs Lab 09/26/13 0225 09/27/13 0256 09/28/13 0431  WBC 12.4* 11.6* 9.0  NEUTROABS 10.3*  --   --   HGB 7.9* 7.0* 7.1*  HCT 23.5* 20.7* 21.1*  MCV 83.9 83.8 84.4  PLT 163 165 171   Cardiac Enzymes:  Recent Labs Lab 09/26/13 0225 09/26/13 0653 09/26/13 1140  TROPONINI <0.30 <0.30 <0.30   CBG:  Recent Labs Lab 09/27/13 0844 09/27/13 1247 09/27/13 1529 09/27/13 2117 09/28/13 0847  GLUCAP 103* 170* 107* 179* 82    Recent Results (from the past 240 hour(s))  MRSA PCR SCREENING     Status: None   Collection Time    09/25/13 11:37 PM      Result Value Ref Range Status   MRSA by PCR NEGATIVE  NEGATIVE Final   Comment:            The GeneXpert MRSA Assay (FDA     approved for NASAL specimens     only), is one component of a     comprehensive MRSA colonization     surveillance program. It is not     intended to diagnose MRSA     infection nor to guide or     monitor treatment for     MRSA infections.  URINE CULTURE     Status: None   Collection Time    09/26/13  1:45 AM      Result Value Ref Range Status   Specimen Description URINE, RANDOM   Final   Special Requests NONE   Final   Culture  Setup Time     Final   Value: 09/26/2013 08:59     Performed at [facility] Count     Final   Value: 20,OOO COLONIES/ML     Performed at [facility]   Culture     Final   Value: [place]     Performed at [facility]   Report Status  PENDING   Incomplete  CULTURE, BLOOD (ROUTINE X 2)     Status: None   Collection Time    09/26/13  2:25 AM      Result Value Ref Range Status   Specimen Description BLOOD RIGHT HAND   Final   Special Requests BOTTLES DRAWN AEROBIC ONLY 5CC  Final   Culture  Setup Time     Final   Value: 09/26/2013 08:51     Performed at [facility]   Culture     Final   Value:        BLOOD CULTURE RECEIVED NO GROWTH TO DATE CULTURE WILL BE HELD FOR 5 DAYS BEFORE ISSUING A FINAL NEGATIVE REPORT     Performed at [facility]   Report Status PENDING   Incomplete  CULTURE, BLOOD (ROUTINE X 2)     Status: None   Collection Time    09/26/13  2:31 AM      Result Value Ref Range Status   Specimen Description BLOOD RIGHT FOREARM   Final   Special Requests BOTTLES DRAWN AEROBIC ONLY 5CC   Final   Culture  Setup Time     Final   Value: 09/26/2013 08:51     Performed at [facility]   Culture     Final   Value:        BLOOD CULTURE RECEIVED NO GROWTH TO DATE CULTURE WILL BE HELD FOR 5 DAYS BEFORE ISSUING A FINAL NEGATIVE REPORT     Performed at [facility]   Report Status PENDING   Incomplete     Studies: Dg Chest 2 View  09/28/2013   CLINICAL DATA Cough, congestion, history diabetes, hypertension, pneumonia, chronic kidney disease  EXAM CHEST  2 VIEW  COMPARISON 09/26/2013  FINDINGS Enlargement of cardiac silhouette.  Mediastinal contours normal.  Diffuse right lung infiltrate consistent with pneumonia.  Left lung remains clear.  No pleural effusion or pneumothorax.  IMPRESSION Persistent diffuse right lung infiltrate consistent with pneumonia.  Little change.  SIGNATURE  Electronically Signed   By: Lavonia Dana M.D.   On: 09/28/2013 08:25    Scheduled Meds: . amLODipine  10 mg Oral Daily  . antiseptic oral rinse  15 mL Mouth Rinse BID  . aspirin EC  81 mg Oral Daily  . calcitRIOL  0.25 mcg Oral Daily  . calcium acetate  667 mg Oral TID WC  . carvedilol  12.5 mg Oral BID  WC  . darbepoetin (ARANESP) injection - NON-DIALYSIS  100 mcg Subcutaneous Q Tue-1800  . furosemide  80 mg Oral Daily  . heparin  5,000 Units Subcutaneous 3 times per day  . hydrALAZINE  75 mg Oral 4 times per day  . insulin aspart  0-5 Units Subcutaneous QHS  . insulin aspart  0-9 Units Subcutaneous TID WC  . insulin glargine  20 Units Subcutaneous Daily  . ipratropium-albuterol  3 mL Nebulization TID  . isosorbide mononitrate  60 mg Oral Daily  . levofloxacin  500 mg Oral Q48H   Continuous Infusions:   Active Problems:   CKD (chronic kidney disease), stage V   Pulmonary edema   Acute respiratory failure with hypoxia   HTN (hypertension)   Anemia in CKD (chronic kidney disease)   Diabetes mellitus, type 2    Ruben Im PA-S Imogene Burn, PA-C  [facility] Pager [phone]. If 7PM-7AM, please contact night-coverage at [email or web address], password [facility] 09/28/2013, 11:18 AM  LOS: 3 days   Attending Patient seen and examined, agree with the above assessment and plan. Suspect Right PNA, restart Levaquin. Nephrology following for Acute on CKD.   Nena Alexander MD

## 2013-09-28 NOTE — Progress Notes (Signed)
Pt temp 101.1, checked twice. Paged Magik-Myers,MD. Awaiting for new order. Will continue to monitor.

## 2013-09-28 NOTE — Progress Notes (Signed)
S: Feels better.  Less SOB O:BP 148/71  Pulse 81  Temp(Src) 98.9 F (37.2 C) (Oral)  Resp 20  Ht 5\' 8"  (1.727 m)  Wt 77.701 kg (171 lb 4.8 oz)  BMI 26.05 kg/m2  SpO2 94%  Intake/Output Summary (Last 24 hours) at 09/28/13 0836 Last data filed at 09/28/13 0505  Gross per 24 hour  Intake   1320 ml  Output   2300 ml  Net   -980 ml   Weight change: -1.392 kg (-3 lb 1.1 oz) [id number] and alert CVS:RRR Resp:decreased BS on Rt with scattered rhonchi Abd:+ BS NTND Ext:no edema  Lt AVF + bruit NEURO:CNI Ox3 No asterixis   . amLODipine  10 mg Oral Daily  . antiseptic oral rinse  15 mL Mouth Rinse BID  . aspirin EC  81 mg Oral Daily  . calcium acetate  667 mg Oral TID WC  . carvedilol  12.5 mg Oral BID WC  . darbepoetin (ARANESP) injection - NON-DIALYSIS  100 mcg Subcutaneous Q Tue-1800  . furosemide  80 mg Oral Daily  . heparin  5,000 Units Subcutaneous 3 times per day  . hydrALAZINE  75 mg Oral 4 times per day  . insulin aspart  0-5 Units Subcutaneous QHS  . insulin aspart  0-9 Units Subcutaneous TID WC  . insulin glargine  20 Units Subcutaneous Daily  . isosorbide mononitrate  60 mg Oral Daily   Dg Chest 2 View  09/28/2013   CLINICAL DATA Cough, congestion, history diabetes, hypertension, pneumonia, chronic kidney disease  EXAM CHEST  2 VIEW  COMPARISON 09/26/2013  FINDINGS Enlargement of cardiac silhouette.  Mediastinal contours normal.  Diffuse right lung infiltrate consistent with pneumonia.  Left lung remains clear.  No pleural effusion or pneumothorax.  IMPRESSION Persistent diffuse right lung infiltrate consistent with pneumonia.  Little change.  SIGNATURE  Electronically Signed   By: Lavonia Dana M.D.   On: 09/28/2013 08:25   BMET    Component Value Date/Time   NA 142 09/28/2013 0431   K 4.1 09/28/2013 0431   CL 103 09/28/2013 0431   CO2 21 09/28/2013 0431   GLUCOSE 72 09/28/2013 0431   BUN 85* 09/28/2013 0431   CREATININE 5.60* 09/28/2013 0431   CALCIUM 9.0 09/28/2013 0431    GFRNONAA 9* 09/28/2013 0431   GFRAA 11* 09/28/2013 0431   CBC    Component Value Date/Time   WBC 9.0 09/28/2013 0431   RBC 2.50* 09/28/2013 0431   HGB 7.1* 09/28/2013 0431   HCT 21.1* 09/28/2013 0431   PLT 171 09/28/2013 0431   MCV 84.4 09/28/2013 0431   MCH 28.4 09/28/2013 0431   MCHC 33.6 09/28/2013 0431   RDW 15.0 09/28/2013 0431   LYMPHSABS 1.2 09/26/2013 0225   MONOABS 0.8 09/26/2013 0225   EOSABS 0.0 09/26/2013 0225   BASOSABS 0.0 09/26/2013 0225     Assessment: 1. Rt PNA 2. 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 Anemia, on aranesp.  HG down to 7.0 4. HTN 5. DM 6. Sec HPTH  Plan: 1. Start calcitriol for sec HPTH 2. Recheck Scr in AM 3. I am not convinced this is pulm edema.  CXR looks more like PNA.  He was on AB, I would resume   Lucas Calderon

## 2013-09-28 NOTE — Progress Notes (Signed)
Pt received 1Ublood, procedure well tolerated. No complaints made. VS taken and stable. Will continue to monitor.

## 2013-09-28 NOTE — Progress Notes (Signed)
Checked pt SpO2 99 at 6LPM, decreased to 4LPM. We will recheck later. Will continue to monitor.

## 2013-09-28 NOTE — Progress Notes (Signed)
ANTIBIOTIC CONSULT NOTE - INITIAL  Pharmacy Consult for Levaquin Indication: CAP  Allergies  Allergen Reactions  . Penicillins     Unknown    Patient Measurements: Height: 5\' 8"  (172.7 cm) Weight: 171 lb 4.8 oz (77.701 kg) IBW/kg (Calculated) : 68.4  Vital Signs: Temp: 98.9 F (37.2 C) (03/12 0504) Temp src: Oral (03/12 0504) BP: 148/71 mmHg (03/12 0504) Pulse Rate: 81 (03/12 0504) Intake/Output from previous day: 03/11 0701 - 03/12 0700 In: 1320 [P.O.:1320] Out: 2300 [Urine:2300]   Recent Labs  09/26/13 0225 09/27/13 0256 09/28/13 0431  WBC 12.4* 11.6* 9.0  HGB 7.9* 7.0* 7.1*  PLT 163 165 171  CREATININE 4.81* 5.19* 5.60*   Estimated Creatinine Clearance: 11.9 ml/min (by C-G formula based on Cr of 5.6). No results found for this basename: VANCOTROUGH, VANCOPEAK, VANCORANDOM, GENTTROUGH, GENTPEAK, GENTRANDOM, TOBRATROUGH, TOBRAPEAK, TOBRARND, AMIKACINPEAK, AMIKACINTROU, AMIKACIN,  in the last 72 hours   Medical History: Past Medical History  Diagnosis Date  . Anemia   . Diabetes mellitus without complication   . Chronic kidney disease   . Pneumonia   . Hypertension   . GERD (gastroesophageal reflux disease)     Medications:  See med list  Assessment: 73 y.o. male admitted 3/10 from [place] with SOB. Pt with stage V CKD (baseline SCr 4.61), est CrCl 12 ml/min - not yet on HD.  Noted recent placement of AVG in anticipation of HD soon.   Pt received levaquin 750 mg x1 dose on 3/10 - timing is appropriate to continue with 500 mg q48h dosing.   Blood cultures x2 drawn 3/10: ngtd Urine culture x1 drawn 3/10: ngtd  Goal of Therapy:  Vancomycin trough level 15-20 mcg/ml  Plan:  Levaquin 750 mg po x1 (received on 3/10) followed by 500 mg po q48h F/u blood and urine cx F/u duration of therapy  Hughes Better, PharmD, BCPS Clinical Pharmacist Pager: [phone] 09/28/2013 9:56 AM

## 2013-09-29 DIAGNOSIS — E119 Type 2 diabetes mellitus without complications: Secondary | ICD-10-CM

## 2013-09-29 LAB — TYPE AND SCREEN
ABO/RH(D): O POS
Antibody Screen: NEGATIVE
UNIT DIVISION: 0

## 2013-09-29 LAB — CBC
HEMATOCRIT: 25.9 % — AB (ref 39.0–52.0)
HEMOGLOBIN: 8.8 g/dL — AB (ref 13.0–17.0)
MCH: 28.5 pg (ref 26.0–34.0)
MCHC: 34 g/dL (ref 30.0–36.0)
MCV: 83.8 fL (ref 78.0–100.0)
Platelets: 180 10*3/uL (ref 150–400)
RBC: 3.09 MIL/uL — ABNORMAL LOW (ref 4.22–5.81)
RDW: 14.3 % (ref 11.5–15.5)
WBC: 9.5 10*3/uL (ref 4.0–10.5)

## 2013-09-29 LAB — GLUCOSE, CAPILLARY
Glucose-Capillary: 90 mg/dL (ref 70–99)
Glucose-Capillary: 210 mg/dL — ABNORMAL HIGH (ref 70–99)
Glucose-Capillary: 137 mg/dL — ABNORMAL HIGH (ref 70–99)
Glucose-Capillary: 177 mg/dL — ABNORMAL HIGH (ref 70–99)

## 2013-09-29 LAB — RENAL FUNCTION PANEL
Albumin: 2.2 g/dL — ABNORMAL LOW (ref 3.5–5.2)
BUN: 86 mg/dL — ABNORMAL HIGH (ref 6–23)
CALCIUM: 9.3 mg/dL (ref 8.4–10.5)
CO2: 20 meq/L (ref 19–32)
CREATININE: 5.59 mg/dL — AB (ref 0.50–1.35)
Chloride: 100 mEq/L (ref 96–112)
GFR calc Af Amer: 11 mL/min — ABNORMAL LOW (ref >90)
GFR calc non Af Amer: 9 mL/min — ABNORMAL LOW (ref >90)
GLUCOSE: 82 mg/dL (ref 70–99)
Phosphorus: 5.5 mg/dL — ABNORMAL HIGH (ref 2.3–4.6)
Potassium: 4 mEq/L (ref 3.7–5.3)
Sodium: 141 mEq/L (ref 137–147)

## 2013-09-29 NOTE — Progress Notes (Signed)
PROGRESS NOTE  Lucas Calderon [id number] DOB: 11-02-1940 DOA: 09/25/2013 PCP: Maryella Shivers, MD  HPI/Subjective: Lucas Calderon is a 73 yo African American male with stage V CKD. He was transferred from [facility] on 09/26/2013 with a one day history of worsening dyspnea in the setting of asymmetric pulmonary edema and volume overload in the setting of severe CKD and dietary indiscretions.   Today (3/13) patient states he feels better and is having less difficulty breathing. He slept well. He says his SOB is minimal and denies any chest pain, abdominal pain, nausea and vomiting. He desires to get up and mobile today.    Assessment/Plan: Community Acquired Pneumonia - Improving, less oxygen requirement. - Blood cultures on 3/10 and 3/12 negative, -CXR on 3/12 consistent with worsening PNA; Legionella and Strep pneumo antigens negative -Continue Levaquin 500mg  every 48 hours -Continue to wean oxygen as tolerated; Currently stats >90% on 0.25 L -Cough incentive spirometry Q4 hrs while awake; PT eval  Acute respiratory failure with hypoxia  - Secondary to above. Patient required 6 L of oxygen, now being weaned off oxygen.  CKD (chronic kidney disease), stage V  -Appreciate nephrology consult by Dr. Mercy Moore  -Patient has recent fistula placement February 2015 but no indications to initiate dialysis at this time  -Discontinue losartan per nephrology -Start calcitrol for secondary hyperparathyroid (parathyroid at 150.7) per nephrology -Continue oral lasix and phoslo -Continue daily weights (down 2lb since yesterday) and strict I&O - (5,395 cc) since admission   HTN (hypertension) - Stable with Coreg, hydralazine, Lasix and Imdur  Grade 2 chronic diastolic heart failure  -Echocardiogram this admission shows a new finding of grade 2 diastolic dysfunction  - Clinically compensated, on Lasix, Coreg.  Anemia in CKD (chronic kidney disease)  -Nephrology has started Aranesp this  admission  -Patient received 1 unit PRBC on 3/12, hemoglobin stable at 8.8  Diabetes mellitus, type 2  -Current CBGs well controlled  -Continue Lantus and SSI  DVT Prophylaxis:  Heprin  Code Status: Full Family Communication: Patient alert, oriented and aware of plan. No family members present at this time. Disposition Plan: Home hopefully in the next 1-2 days   Consultants:  Dr. Mercy Moore of nephrology  Procedures:  None  Antibiotics:  Levaquin  Objective: Filed Vitals:   09/29/13 0412 09/29/13 0455 09/29/13 0808 09/29/13 0809  BP:  137/68 140/62   Pulse:  81 83   Temp:  99.2 F (37.3 C)    TempSrc:  Oral    Resp:  16    Height:      Weight: 76.5 kg (168 lb 10.4 oz)     SpO2:  100%  93%    Intake/Output Summary (Last 24 hours) at 09/29/13 0946 Last data filed at 09/29/13 0912  Gross per 24 hour  Intake    790 ml  Output   1975 ml  Net  -1185 ml   Filed Weights   09/27/13 1816 09/28/13 0504 09/29/13 0412  Weight: 78.608 kg (173 lb 4.8 oz) 77.701 kg (171 lb 4.8 oz) 76.5 kg (168 lb 10.4 oz)    Exam: General: Well developed, well nourished, fatigued, in NAD  HEENT:  PERR, EOMI, dry mucous membranes.  Neck: Supple, no JVD, no lymphadenopathy  Cardiovascular: RRR, S1 S2 auscultated, no rubs, murmurs or gallops.   Respiratory: Scattered crackles, equal chest rise, mildly increased WOB Abdomen: Nontender, nondistended, + bowel sounds  Extremities: Warm and dry without cyanosis clubbing or edema.  Neuro: AAOx3, cranial nerves grossly intact. Strength  5/5 in upper and lower extremities  Skin: Without rashes exudates or nodules.   Psych: Normal affect and demeanor with intact judgement and insight   Data Reviewed: Basic Metabolic Panel:  Recent Labs Lab 09/26/13 0225 09/27/13 0256 09/28/13 0431 09/29/13 0656  NA 142 141 142 141  K 4.2 4.1 4.1 4.0  CL 103 103 103 100  CO2 19 21 21 20   GLUCOSE 159* 104* 72 82  BUN 75* 81* 85* 86*  CREATININE 4.81*  5.19* 5.60* 5.59*  CALCIUM 8.8 9.0 9.0 9.3  MG 2.3  --   --   --   PHOS 5.4* 6.1* 5.4* 5.5*   Liver Function Tests:  Recent Labs Lab 09/26/13 0225 09/27/13 0256 09/28/13 0431 09/29/13 0656  AST 16  --   --   --   ALT 19  --   --   --   ALKPHOS 60  --   --   --   BILITOT 0.5  --   --   --   PROT 7.3  --   --   --   ALBUMIN 2.7* 2.2* 2.2* 2.2*    Recent Labs Lab 09/26/13 0225  AMYLASE 61   CBC:  Recent Labs Lab 09/26/13 0225 09/27/13 0256 09/28/13 0431 09/29/13 0656  WBC 12.4* 11.6* 9.0 9.5  NEUTROABS 10.3*  --   --   --   HGB 7.9* 7.0* 7.1* 8.8*  HCT 23.5* 20.7* 21.1* 25.9*  MCV 83.9 83.8 84.4 83.8  PLT 163 165 171 180   Cardiac Enzymes:  Recent Labs Lab 09/26/13 0225 09/26/13 0653 09/26/13 1140  TROPONINI <0.30 <0.30 <0.30   CBG:  Recent Labs Lab 09/28/13 0847 09/28/13 1120 09/28/13 1606 09/28/13 2134 09/29/13 0803  GLUCAP 82 159* 103* 117* 90    Recent Results (from the past 240 hour(s))  MRSA PCR SCREENING     Status: None   Collection Time    09/25/13 11:37 PM      Result Value Ref Range Status   MRSA by PCR NEGATIVE  NEGATIVE Final   Comment:            The GeneXpert MRSA Assay (FDA     approved for NASAL specimens     only), is one component of a     comprehensive MRSA colonization     surveillance program. It is not     intended to diagnose MRSA     infection nor to guide or     monitor treatment for     MRSA infections.  URINE CULTURE     Status: None   Collection Time    09/26/13  1:45 AM      Result Value Ref Range Status   Specimen Description URINE, RANDOM   Final   Special Requests NONE   Final   Culture  Setup Time     Final   Value: 09/26/2013 08:59     Performed at [facility] Count     Final   Value: 20,OOO COLONIES/ML     Performed at [facility]   Culture     Final   Value: [place]     Performed at [facility]   Report Status PENDING   Incomplete  CULTURE,  BLOOD (ROUTINE X 2)     Status: None   Collection Time    09/26/13  2:25 AM      Result Value Ref Range Status   Specimen  Description BLOOD RIGHT HAND   Final   Special Requests BOTTLES DRAWN AEROBIC ONLY 5CC   Final   Culture  Setup Time     Final   Value: 09/26/2013 08:51     Performed at [facility]   Culture     Final   Value:        BLOOD CULTURE RECEIVED NO GROWTH TO DATE CULTURE WILL BE HELD FOR 5 DAYS BEFORE ISSUING A FINAL NEGATIVE REPORT     Performed at [facility]   Report Status PENDING   Incomplete  CULTURE, BLOOD (ROUTINE X 2)     Status: None   Collection Time    09/26/13  2:31 AM      Result Value Ref Range Status   Specimen Description BLOOD RIGHT FOREARM   Final   Special Requests BOTTLES DRAWN AEROBIC ONLY 5CC   Final   Culture  Setup Time     Final   Value: 09/26/2013 08:51     Performed at [facility]   Culture     Final   Value:        BLOOD CULTURE RECEIVED NO GROWTH TO DATE CULTURE WILL BE HELD FOR 5 DAYS BEFORE ISSUING A FINAL NEGATIVE REPORT     Performed at [facility]   Report Status PENDING   Incomplete     Studies: Dg Chest 2 View  09/28/2013   CLINICAL DATA Cough, congestion, history diabetes, hypertension, pneumonia, chronic kidney disease  EXAM CHEST  2 VIEW  COMPARISON 09/26/2013  FINDINGS Enlargement of cardiac silhouette.  Mediastinal contours normal.  Diffuse right lung infiltrate consistent with pneumonia.  Left lung remains clear.  No pleural effusion or pneumothorax.  IMPRESSION Persistent diffuse right lung infiltrate consistent with pneumonia.  Little change.  SIGNATURE  Electronically Signed   By: Lavonia Dana M.D.   On: 09/28/2013 08:25    Scheduled Meds: . amLODipine  10 mg Oral Daily  . antiseptic oral rinse  15 mL Mouth Rinse BID  . aspirin EC  81 mg Oral Daily  . calcitRIOL  0.25 mcg Oral Daily  . calcium acetate  667 mg Oral TID WC  . carvedilol  12.5 mg Oral BID WC  . darbepoetin (ARANESP)  injection - NON-DIALYSIS  100 mcg Subcutaneous Q Tue-1800  . furosemide  80 mg Oral Daily  . heparin  5,000 Units Subcutaneous 3 times per day  . hydrALAZINE  75 mg Oral 4 times per day  . insulin aspart  0-5 Units Subcutaneous QHS  . insulin aspart  0-9 Units Subcutaneous TID WC  . insulin glargine  20 Units Subcutaneous Daily  . ipratropium-albuterol  3 mL Nebulization TID  . isosorbide mononitrate  60 mg Oral Daily  . levofloxacin  500 mg Oral Q48H   Continuous Infusions:   Active Problems:   CKD (chronic kidney disease), stage V   Pulmonary edema   Acute respiratory failure with hypoxia   HTN (hypertension)   Anemia in CKD (chronic kidney disease)   Diabetes mellitus, type 2    Ruben Im PA-S  [facility] Pager [phone]. If 7PM-7AM, please contact night-coverage at [email or web address], password [facility] 09/29/2013, 9:46 AM  LOS: 4 days   Attending Seen and examined, agree with the above assessment and plan, improving, but significantly less oxygen requirements. Continue Levaquin, continue Lasix. We'll continue to wean off oxygen, home soon. Get PT evaluation today.  Nena Alexander MD

## 2013-09-29 NOTE — Evaluation (Signed)
Physical Therapy Evaluation Patient Details Name: Md Gory MRN: [id number] DOB: [birth year] Today's Date: 09/29/2013 Time: [id number] PT Time Calculation (min): 18 min  PT Assessment / Plan / Recommendation History of Present Illness  73 year old male with stage V CKD (BL cr 4.61). Admitted on 3/10 from [place] w/ 1d h/o increased work of breathing in setting of  asymmetric pulmonary edema following a weekend of dietary indiscretions.  Admitted w/ working dx of volume overload   Clinical Impression  Patient presents with problems listed below.  Will benefit from acute PT to maximize independence prior to discharge.  Recommend HHPT for continued therapy at discharge - patient will need 24 hour assist.  If not available, may need to consider ST-SNF.    PT Assessment  Patient needs continued PT services    Follow Up Recommendations  Home health PT;Supervision/Assistance - 24 hour    Does the patient have the potential to tolerate intense rehabilitation      Barriers to Discharge        Equipment Recommendations  Rolling walker with 5" wheels    Recommendations for Other Services     Frequency Min 3X/week    Precautions / Restrictions Precautions Precautions: Fall Restrictions Weight Bearing Restrictions: No   Pertinent Vitals/Pain       Mobility  Bed Mobility Overal bed mobility: Modified Independent General bed mobility comments: Patient able to move sit to sidelying with use of bedrail. Transfers Overall transfer level: Needs assistance Equipment used: Rolling walker (2 wheeled) Transfers: Sit to/from Stand Sit to Stand: Min assist General transfer comment: Verbal cues for hand placement.  Assist for balance/safety during transfer. Ambulation/Gait Ambulation/Gait assistance: Min guard Ambulation Distance (Feet): 72 Feet Assistive device: Rolling walker (2 wheeled) Gait Pattern/deviations: Step-through pattern;Decreased step length - right;Decreased step length  - left;Steppage Gait velocity: Slow gait speed Gait velocity interpretation: Below normal speed for age/gender General Gait Details: Verbal cues for safe use of RW.  Cues to stay close to RW, keeping feet inside.  Patient keeps RW too far ahead of himself.  Steps with decreased foot/ankle control - as if unable to feel feet.  Patient reports no numbness in feet.    Exercises     PT Diagnosis: Difficulty walking;Abnormality of gait;Generalized weakness;Altered mental status  PT Problem List: Decreased strength;Decreased activity tolerance;Decreased balance;Decreased mobility;Decreased cognition;Decreased knowledge of use of DME;Decreased safety awareness;Cardiopulmonary status limiting activity PT Treatment Interventions: DME instruction;Gait training;Stair training;Functional mobility training;Balance training;Cognitive remediation;Patient/family education     PT Goals(Current goals can be found in the care plan section) Acute Rehab PT Goals Patient Stated Goal: To go home PT Goal Formulation: With patient Time For Goal Achievement: 10/06/13 Potential to Achieve Goals: Good  Visit Information  Last PT Received On: 09/29/13 Assistance Needed: +1 History of Present Illness: 73 year old male with stage V CKD (BL cr 4.61). Admitted on 3/10 from [place] w/ 1d h/o increased work of breathing in setting of  asymmetric pulmonary edema following a weekend of dietary indiscretions.  Admitted w/ working dx of volume overload        Prior [place] expects to be discharged to:: Private residence Living Arrangements: Other relatives (Niece) Available Help at Discharge: Family Type of Home: Mobile home Home Access: Stairs to enter Technical brewer of Steps: 4 Entrance Stairs-Rails: [place]: One level [place]: [place] - single point Prior Function Level of Independence: Independent with assistive device(s) Comments: Patient uses cane for  ambulation.  Niece  fixes meals and does housekeeping per patient. Communication Communication: No difficulties    Cognition  Cognition Arousal/Alertness: Awake/alert Behavior During Therapy: WFL for tasks assessed/performed Overall Cognitive Status: No family/caregiver present to determine baseline cognitive functioning (Patient with decreased safety awareness  - baseline)    Extremity/Trunk Assessment Upper Extremity Assessment Upper Extremity Assessment: Overall WFL for tasks assessed Lower Extremity Assessment Lower Extremity Assessment: Generalized weakness   Balance Balance Overall balance assessment: Needs assistance Standing balance support: Bilateral upper extremity supported Standing balance-Leahy Scale: Fair  End of Session PT - End of Session Equipment Utilized During Treatment: Gait belt Activity Tolerance: Patient limited by fatigue Patient left: in bed;with call bell/phone within reach;with bed alarm set Nurse Communication: Mobility status  GP     Despina Pole 09/29/2013, 3:46 PM Carita Pian. Sanjuana Kava, [place] Pager [phone]

## 2013-09-29 NOTE — Care Management Note (Signed)
    Page 1 of 1   09/29/2013     3:50:48 PM   CARE MANAGEMENT NOTE 09/29/2013  Patient:  Lucas Calderon, Lucas Calderon   Account Number:  [id number]  Date Initiated:  09/26/2013  Documentation initiated by:  Luz Lex  Subjective/Objective Assessment:   Admitted with resp failure - ?? need for HD.     Action/Plan:   pt eval   Anticipated DC Date:  10/01/2013   Anticipated DC Plan:  [place]  CM consult      Choice offered to / List presented to:             Status of service:  In process, will continue to follow Medicare Important Message given?   (If response is "NO", the following Medicare IM given date fields will be blank) Date Medicare IM given:   Date Additional Medicare IM given:    Discharge Disposition:    Per UR Regulation:  Reviewed for med. necessity/level of care/duration of stay  If discussed at [place] of Stay Meetings, dates discussed:    Comments:  ContactAileen Fass Sister [place] Brother [phone]  09/29/13 [place], BSN [phone] patient lives with niece,await pt eval.

## 2013-09-29 NOTE — Progress Notes (Signed)
Pt O2 sat 98% at 2LPM. RN tried to wean O2 at 1.5LPM-O2sat 97%. Pt appeared to be in no distress at this time. Will continue to monitor.

## 2013-09-29 NOTE — Progress Notes (Signed)
S: No new CO.  Eating well.  Sitting up in chair O:BP 140/62  Pulse 83  Temp(Src) 99.2 F (37.3 C) (Oral)  Resp 16  Ht 5\' 8"  (1.727 m)  Wt 76.5 kg (168 lb 10.4 oz)  BMI 25.65 kg/m2  SpO2 93%  Intake/Output Summary (Last 24 hours) at 09/29/13 1047 Last data filed at 09/29/13 0912  Gross per 24 hour  Intake    790 ml  Output   1975 ml  Net  -1185 ml   Weight change: -2.108 kg (-4 lb 10.4 oz) [id number] and alert [facility]:RRR Resp: Rt crackles but improved aeration Abd:+ BS NTND Ext:no edema  Lt AVF + bruit NEURO:CNI Ox3 No asterixis GU Condom cath   . amLODipine  10 mg Oral Daily  . antiseptic oral rinse  15 mL Mouth Rinse BID  . aspirin EC  81 mg Oral Daily  . calcitRIOL  0.25 mcg Oral Daily  . calcium acetate  667 mg Oral TID WC  . carvedilol  12.5 mg Oral BID WC  . darbepoetin (ARANESP) injection - NON-DIALYSIS  100 mcg Subcutaneous Q Tue-1800  . furosemide  80 mg Oral Daily  . heparin  5,000 Units Subcutaneous 3 times per day  . hydrALAZINE  75 mg Oral 4 times per day  . insulin aspart  0-5 Units Subcutaneous QHS  . insulin aspart  0-9 Units Subcutaneous TID WC  . insulin glargine  20 Units Subcutaneous Daily  . ipratropium-albuterol  3 mL Nebulization TID  . isosorbide mononitrate  60 mg Oral Daily  . levofloxacin  500 mg Oral Q48H   Dg Chest 2 View  09/28/2013   CLINICAL DATA Cough, congestion, history diabetes, hypertension, pneumonia, chronic kidney disease  EXAM CHEST  2 VIEW  COMPARISON 09/26/2013  FINDINGS Enlargement of cardiac silhouette.  Mediastinal contours normal.  Diffuse right lung infiltrate consistent with pneumonia.  Left lung remains clear.  No pleural effusion or pneumothorax.  IMPRESSION Persistent diffuse right lung infiltrate consistent with pneumonia.  Little change.  SIGNATURE  Electronically Signed   By: Lavonia Dana M.D.   On: 09/28/2013 08:25   BMET    Component Value Date/Time   NA 141 09/29/2013 0656   K 4.0 09/29/2013 0656   CL 100  09/29/2013 0656   CO2 20 09/29/2013 0656   GLUCOSE 82 09/29/2013 0656   BUN 86* 09/29/2013 0656   CREATININE 5.59* 09/29/2013 0656   CALCIUM 9.3 09/29/2013 0656   GFRNONAA 9* 09/29/2013 0656   GFRAA 11* 09/29/2013 0656   CBC    Component Value Date/Time   WBC 9.5 09/29/2013 0656   RBC 3.09* 09/29/2013 0656   HGB 8.8* 09/29/2013 0656   HCT 25.9* 09/29/2013 0656   PLT 180 09/29/2013 0656   MCV 83.8 09/29/2013 0656   MCH 28.5 09/29/2013 0656   MCHC 34.0 09/29/2013 0656   RDW 14.3 09/29/2013 0656   LYMPHSABS 1.2 09/26/2013 0225   MONOABS 0.8 09/26/2013 0225   EOSABS 0.0 09/26/2013 0225   BASOSABS 0.0 09/26/2013 0225     Assessment: 1. Rt PNA, on levaquin 2. 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 Anemia, on aranesp.  HG down to 7.0 4. HTN 5. DM 6. Sec HPTH  Plan: 1. Renal fx stable 2. Cont AB   Lucas Calderon T

## 2013-09-30 LAB — RENAL FUNCTION PANEL
Albumin: 2.4 g/dL — ABNORMAL LOW (ref 3.5–5.2)
BUN: 95 mg/dL — ABNORMAL HIGH (ref 6–23)
CO2: 22 meq/L (ref 19–32)
Calcium: 9.6 mg/dL (ref 8.4–10.5)
Chloride: 99 mEq/L (ref 96–112)
Creatinine, Ser: 5.59 mg/dL — ABNORMAL HIGH (ref 0.50–1.35)
GFR calc non Af Amer: 9 mL/min — ABNORMAL LOW (ref >90)
GFR, EST AFRICAN AMERICAN: 11 mL/min — AB (ref >90)
GLUCOSE: 90 mg/dL (ref 70–99)
PHOSPHORUS: 5.4 mg/dL — AB (ref 2.3–4.6)
Potassium: 4 mEq/L (ref 3.7–5.3)
SODIUM: 140 meq/L (ref 137–147)

## 2013-09-30 LAB — URINE CULTURE

## 2013-09-30 LAB — GLUCOSE, CAPILLARY
GLUCOSE-CAPILLARY: 251 mg/dL — AB (ref 70–99)
Glucose-Capillary: 91 mg/dL (ref 70–99)

## 2013-09-30 MED ORDER — LEVOFLOXACIN 500 MG PO TABS: 500.0000 mg | ORAL_TABLET | ORAL | Status: DC

## 2013-09-30 MED ORDER — CALCIUM ACETATE 667 MG PO CAPS: 667.0000 mg | ORAL_CAPSULE | Freq: Three times a day (TID) | ORAL | Status: DC

## 2013-09-30 MED ORDER — INSULIN GLARGINE 100 UNIT/ML ~~LOC~~ SOLN: 20.0000 [IU] | Freq: Every day | SUBCUTANEOUS | Status: DC

## 2013-09-30 MED ORDER — CALCITRIOL 0.25 MCG PO CAPS: 0.2500 ug | ORAL_CAPSULE | Freq: Every day | ORAL | Status: DC

## 2013-09-30 MED ORDER — FUROSEMIDE 80 MG PO TABS: 80.0000 mg | ORAL_TABLET | Freq: Every day | ORAL | Status: DC

## 2013-09-30 NOTE — Progress Notes (Signed)
Pt weaned off from oxygen-O2 sat remained >90s. No SOB observed. Will continue to monitor.

## 2013-09-30 NOTE — Progress Notes (Signed)
S: No new CO. Says he walked without the need for O2 O:BP 146/57  Pulse 73  Temp(Src) 98.7 F (37.1 C) (Oral)  Resp 18  Ht 5\' 8"  (1.727 m)  Wt 75.751 kg (167 lb)  BMI 25.40 kg/m2  SpO2 99%  Intake/Output Summary (Last 24 hours) at 09/30/13 0935 Last data filed at 09/30/13 [id number]  Gross per 24 hour  Intake    642 ml  Output   1825 ml  Net  -1183 ml   Weight change: -0.749 kg (-1 lb 10.4 oz) [id number] and alert [facility]:RRR Resp: much better aeration on Rt Abd:+ BS NTND Ext:no edema  Lt AVF + bruit NEURO:CNI Ox3 No asterixis GU Condom cath   . amLODipine  10 mg Oral Daily  . antiseptic oral rinse  15 mL Mouth Rinse BID  . aspirin EC  81 mg Oral Daily  . calcitRIOL  0.25 mcg Oral Daily  . calcium acetate  667 mg Oral TID WC  . carvedilol  12.5 mg Oral BID WC  . darbepoetin (ARANESP) injection - NON-DIALYSIS  100 mcg Subcutaneous Q Tue-1800  . furosemide  80 mg Oral Daily  . heparin  5,000 Units Subcutaneous 3 times per day  . hydrALAZINE  75 mg Oral 4 times per day  . insulin aspart  0-5 Units Subcutaneous QHS  . insulin aspart  0-9 Units Subcutaneous TID WC  . insulin glargine  20 Units Subcutaneous Daily  . ipratropium-albuterol  3 mL Nebulization TID  . isosorbide mononitrate  60 mg Oral Daily  . levofloxacin  500 mg Oral Q48H   No results found. BMET    Component Value Date/Time   NA 140 09/30/2013 0640   K 4.0 09/30/2013 0640   CL 99 09/30/2013 0640   CO2 22 09/30/2013 0640   GLUCOSE 90 09/30/2013 0640   BUN 95* 09/30/2013 0640   CREATININE 5.59* 09/30/2013 0640   CALCIUM 9.6 09/30/2013 0640   GFRNONAA 9* 09/30/2013 0640   GFRAA 11* 09/30/2013 0640   CBC    Component Value Date/Time   WBC 9.5 09/29/2013 0656   RBC 3.09* 09/29/2013 0656   HGB 8.8* 09/29/2013 0656   HCT 25.9* 09/29/2013 0656   PLT 180 09/29/2013 0656   MCV 83.8 09/29/2013 0656   MCH 28.5 09/29/2013 0656   MCHC 34.0 09/29/2013 0656   RDW 14.3 09/29/2013 0656   LYMPHSABS 1.2 09/26/2013 0225   MONOABS 0.8  09/26/2013 0225   EOSABS 0.0 09/26/2013 0225   BASOSABS 0.0 09/26/2013 0225     Assessment: 1. Rt PNA, on levaquin 2. 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 Anemia, on aranesp.  HG down to 7.0 4. HTN 5. DM 6. Sec HPTH  Plan: 1. Renal fx stable.  Home when [place] with primary team     Sandie Swayze T

## 2013-09-30 NOTE — Progress Notes (Signed)
   CARE MANAGEMENT NOTE 09/30/2013  Patient:  Lucas Calderon, Lucas Calderon   Account Number:  [id number]  Date Initiated:  09/26/2013  Documentation initiated by:  [facility]  Subjective/Objective Assessment:   Admitted with resp failure - ?? need for HD.     Action/Plan:   pt eval   Anticipated DC Date:  10/01/2013   Anticipated DC Plan:  Lucas Calderon  CM consult      Choice offered to / List presented to:  C-5 Sibling        HH arranged  [place] - 11 Patient Refused      Status of service:  Completed, signed off Medicare Important Message given?   (If response is "NO", the following Medicare IM given date fields will be blank) Date Medicare IM given:   Date Additional Medicare IM given:    Discharge Disposition:  HOME/SELF CARE  Per UR Regulation:  Reviewed for med. necessity/level of care/duration of stay  If discussed at [place] of Stay Meetings, dates discussed:    Comments:  09/30/13 15:40 CM spoke with pt and sister, Lucas Calderon, concerning HHPT.  Both pt and sister refuse HHPT as they state they have plenty of people to help and do not wish to have anyone coming out to the house.  No other CM needs were communicated.  Lucas Calderon, BSN, Lucas.  ContactAileen Calderon Sister [phone]                 Lucas Calderon Brother [phone]  09/29/13 Lucas Calderon, BSN [phone] patient lives with niece,await pt eval.

## 2013-09-30 NOTE — Discharge Summary (Addendum)
PATIENT DETAILS Name: Lucas Calderon Age: 73 y.o. Sex: male Date of Birth: 09/06/1940 MRN: [id number]. Admit Date: 09/25/2013 Admitting Physician: Raylene Miyamoto, MD [id number], MD  Recommendations for Outpatient Follow-up:  1. Please Repeat Chest xray in 6-8 weeks to document resolution of the infiltrate 2. Continue to monitor renal function closely, will likely need initiation of HD in the near future  PRIMARY DISCHARGE DIAGNOSIS:  Active Problems:   CKD (chronic kidney disease), stage V   Pulmonary edema   Acute respiratory failure with hypoxia   HTN (hypertension)   Anemia in CKD (chronic kidney disease)   Diabetes mellitus, type 2      PAST MEDICAL HISTORY: Past Medical History  Diagnosis Date  . Anemia   . Diabetes mellitus without complication   . Chronic kidney disease   . Pneumonia   . Hypertension   . GERD (gastroesophageal reflux disease)     DISCHARGE MEDICATIONS:   Medication List    STOP taking these medications       torsemide 20 MG tablet  Commonly known as:  DEMADEX      TAKE these medications       amLODipine 10 MG tablet  Commonly known as:  NORVASC  Take 10 mg by mouth daily.     aspirin 81 MG tablet  Take 81 mg by mouth daily.     B-complex with vitamin C tablet  Take 1 tablet by mouth daily.     calcitRIOL 0.25 MCG capsule  Commonly known as:  ROCALTROL  Take 1 capsule (0.25 mcg total) by mouth daily.     calcium acetate 667 MG capsule  Commonly known as:  PHOSLO  Take 1 capsule (667 mg total) by mouth 3 (three) times daily with meals.     carvedilol 12.5 MG tablet  Commonly known as:  COREG  Take 12.5 mg by mouth 2 (two) times daily with a meal.     ferrous sulfate 325 (65 FE) MG EC tablet  Take 325 mg by mouth 2 (two) times daily.     furosemide 80 MG tablet  Commonly known as:  LASIX  Take 1 tablet (80 mg total) by mouth daily.     hydrALAZINE 50 MG tablet  Commonly known as:  APRESOLINE  Take 75 mg by  mouth 4 (four) times daily.     insulin glargine 100 UNIT/ML injection  Commonly known as:  LANTUS  Inject 0.2 mLs (20 Units total) into the skin at bedtime.     isosorbide mononitrate 60 MG 24 hr tablet  Commonly known as:  IMDUR  Take 60 mg by mouth daily.     levofloxacin 500 MG tablet  Commonly known as:  LEVAQUIN  Take 1 tablet (500 mg total) by mouth every other day.     PROVENTIL HFA 108 (90 BASE) MCG/ACT inhaler  Generic drug:  albuterol  Inhale 2 puffs into the lungs every 6 (six) hours as needed for wheezing or shortness of breath.        ALLERGIES:   Allergies  Allergen Reactions  . Penicillins     Unknown    BRIEF HPI:  See H&P, Labs, Consult and Test reports for all details in brief, 74 year old male with stage V CKD (BL cr 4.61). Admitted on 3/10 from [place] w/ 1d h/o increased work of breathing in setting of asymmetric pulmonary edema following a weekend of dietary indiscretions.  CONSULTATIONS:   pulmonary/intensive care and nephrology  PERTINENT RADIOLOGIC STUDIES:  Dg Chest 2 View  09/28/2013   CLINICAL DATA Cough, congestion, history diabetes, hypertension, pneumonia, chronic kidney disease  EXAM CHEST  2 VIEW  COMPARISON 09/26/2013  FINDINGS Enlargement of cardiac silhouette.  Mediastinal contours normal.  Diffuse right lung infiltrate consistent with pneumonia.  Left lung remains clear.  No pleural effusion or pneumothorax.  IMPRESSION Persistent diffuse right lung infiltrate consistent with pneumonia.  Little change.  SIGNATURE  Electronically Signed   By: Lavonia Dana M.D.   On: 09/28/2013 08:25   Dg Chest 2 View  09/11/2013   CLINICAL DATA:  Preoperative chest radiograph; hypertension.  EXAM: CHEST  2 VIEW  COMPARISON:  Chest radiograph performed 08/16/2013  FINDINGS: The lungs are well-aerated. Vascular congestion is noted, without significant pulmonary edema. Mild bibasilar atelectasis is seen. There is no evidence of pleural effusion or pneumothorax.   The heart is borderline enlarged. No acute osseous abnormalities are seen.  IMPRESSION: Vascular congestion and borderline cardiomegaly, without significant pulmonary edema. Mild bibasilar atelectasis seen.   Electronically Signed   By: Garald Balding M.D.   On: 09/11/2013 06:48   Dg Chest Port 1 View  09/26/2013   CLINICAL DATA:  Shortness of breath, cough, congestion  EXAM: PORTABLE CHEST - 1 VIEW  COMPARISON:  Chest x-ray of 09/25/2013  FINDINGS: There has been worsening of airspace disease throughout the right lung with sparing of the left lung. This pattern is most consistent with pneumonia. No definite effusion is seen. The heart is mildly enlarged and stable.  IMPRESSION: Worsening of right lung airspace disease most consistent with pneumonia.   Electronically Signed   By: Ivar Drape M.D.   On: 09/26/2013 08:17     PERTINENT LAB RESULTS: CBC:  Recent Labs  09/28/13 0431 09/29/13 0656  WBC 9.0 9.5  HGB 7.1* 8.8*  HCT 21.1* 25.9*  PLT 171 180   CMET CMP     Component Value Date/Time   NA 140 09/30/2013 0640   K 4.0 09/30/2013 0640   CL 99 09/30/2013 0640   CO2 22 09/30/2013 0640   GLUCOSE 90 09/30/2013 0640   BUN 95* 09/30/2013 0640   CREATININE 5.59* 09/30/2013 0640   CALCIUM 9.6 09/30/2013 0640   PROT 7.3 09/26/2013 0225   ALBUMIN 2.4* 09/30/2013 0640   AST 16 09/26/2013 0225   ALT 19 09/26/2013 0225   ALKPHOS 60 09/26/2013 0225   BILITOT 0.5 09/26/2013 0225   GFRNONAA 9* 09/30/2013 0640   GFRAA 11* 09/30/2013 0640    GFR Estimated Creatinine Clearance: 11.9 ml/min (by C-G formula based on Cr of 5.59). No results found for this basename: LIPASE, AMYLASE,  in the last 72 hours No results found for this basename: CKTOTAL, CKMB, CKMBINDEX, TROPONINI,  in the last 72 hours No components found with this basename: POCBNP,  No results found for this basename: DDIMER,  in the last 72 hours No results found for this basename: HGBA1C,  in the last 72 hours No results found for this  basename: CHOL, HDL, LDLCALC, TRIG, CHOLHDL, LDLDIRECT,  in the last 72 hours No results found for this basename: TSH, T4TOTAL, FREET3, T3FREE, THYROIDAB,  in the last 72 hours No results found for this basename: VITAMINB12, FOLATE, FERRITIN, TIBC, IRON, RETICCTPCT,  in the last 72 hours Coags: No results found for this basename: PT, INR,  in the last 72 hours Microbiology: Recent Results (from the past 240 hour(s))  MRSA PCR SCREENING     Status: None   Collection Time  09/25/13 11:37 PM      Result Value Ref Range Status   MRSA by PCR NEGATIVE  NEGATIVE Final   Comment:            The GeneXpert MRSA Assay (FDA     approved for NASAL specimens     only), is one component of a     comprehensive MRSA colonization     surveillance program. It is not     intended to diagnose MRSA     infection nor to guide or     monitor treatment for     MRSA infections.  URINE CULTURE     Status: None   Collection Time    09/26/13  1:45 AM      Result Value Ref Range Status   Specimen Description URINE, RANDOM   Final   Special Requests NONE   Final   Culture  Setup Time     Final   Value: 09/26/2013 08:59     Performed at [facility]     Final   Value: 20,OOO COLONIES/ML     Performed at [facility]   Culture     Final   Value: VANCOMYCIN RESISTANT ENTEROCOCCUS ISOLATED     Performed at [facility]   Report Status PENDING   Incomplete   Organism ID, Bacteria VANCOMYCIN RESISTANT ENTEROCOCCUS ISOLATED   Final  CULTURE, BLOOD (ROUTINE X 2)     Status: None   Collection Time    09/26/13  2:25 AM      Result Value Ref Range Status   Specimen Description BLOOD RIGHT HAND   Final   Special Requests BOTTLES DRAWN AEROBIC ONLY 5CC   Final   Culture  Setup Time     Final   Value: 09/26/2013 08:51     Performed at [facility]   Culture     Final   Value:        BLOOD CULTURE RECEIVED NO GROWTH TO DATE CULTURE WILL BE HELD FOR 5 DAYS BEFORE  ISSUING A FINAL NEGATIVE REPORT     Performed at [facility]   Report Status PENDING   Incomplete  CULTURE, BLOOD (ROUTINE X 2)     Status: None   Collection Time    09/26/13  2:31 AM      Result Value Ref Range Status   Specimen Description BLOOD RIGHT FOREARM   Final   Special Requests BOTTLES DRAWN AEROBIC ONLY 5CC   Final   Culture  Setup Time     Final   Value: 09/26/2013 08:51     Performed at [facility]   Culture     Final   Value:        BLOOD CULTURE RECEIVED NO GROWTH TO DATE CULTURE WILL BE HELD FOR 5 DAYS BEFORE ISSUING A FINAL NEGATIVE REPORT     Performed at [facility]   Report Status PENDING   Incomplete  CULTURE, BLOOD (ROUTINE X 2)     Status: None   Collection Time    09/28/13 12:15 PM      Result Value Ref Range Status   Specimen Description BLOOD ARM RIGHT   Final   Special Requests BOTTLES DRAWN AEROBIC ONLY [facility]   Final   Culture  Setup Time     Final   Value: 09/28/2013 16:07     Performed at [facility]  Final   Value:        BLOOD CULTURE RECEIVED NO GROWTH TO DATE CULTURE WILL BE HELD FOR 5 DAYS BEFORE ISSUING A FINAL NEGATIVE REPORT     Performed at [facility]   Report Status PENDING   Incomplete  CULTURE, BLOOD (ROUTINE X 2)     Status: None   Collection Time    09/28/13 12:20 PM      Result Value Ref Range Status   Specimen Description BLOOD RIGHT HAND   Final   Special Requests BOTTLES DRAWN AEROBIC ONLY 10CC   Final   Culture  Setup Time     Final   Value: 09/28/2013 16:07     Performed at [facility]   Culture     Final   Value:        BLOOD CULTURE RECEIVED NO GROWTH TO DATE CULTURE WILL BE HELD FOR 5 DAYS BEFORE ISSUING A FINAL NEGATIVE REPORT     Performed at [facility]   Report Status PENDING   Incomplete     BRIEF HOSPITAL COURSE:   Acute respiratory failure with hypoxia  - Secondary to PNA, with some component of acute diastolic heart failure. Patient  initially required 6 L of oxygen, now has been weaned off oxygen and is stable on room air.  Community Acquired Pneumonia -admitted and started on Levaquin.  -Blood cultures on 3/10 and 3/12 negative,Legionella and Strep pneumo antigens negative -Significantly improved, better and now with no O2 requirement, as noted above initially was on 6 L of oxygen -stable for discharge today, will continue with Levaquin on discharge.Please note- Spoke with sister and brother at bedside on 3/14,who will be able to provide 24/7 care. Patient is not keen on going to SNF.  CKD (chronic kidney disease), stage V  -slight worsening of creatinine than baseline, did have a fistula placement February 2015 but no indications to initiate dialysis at this time  -Continue oral lasix and phoslo -spoke with Dr Liz Beach for discharge from his point of view as well, patient already has follow up with Nephrology, have asked him to keep his next appt. Suspect will need initiation of Dialysis in the near future.  HTN (hypertension)  - Stable with Coreg, hydralazine, Lasix and Imdur  -Losartan discontinued due advanced CKD Stage V  Anemia in CKD (chronic kidney disease)  -Nephrology has started Aranesp this admission  -Patient received 1 unit PRBC on 3/12, hemoglobin stable at 8.8 on 3/13  Acute on chronic diastolic heart failure  -Echocardiogram this admission shows a new finding of grade 2 diastolic dysfunction  - Clinically compensated now by the day of discharge, c/w Lasix, Coreg.  Diabetes mellitus, type 2  -Current CBGs well controlled  -Continue Lantus on discharge  TODAY-DAY OF DISCHARGE:  Subjective:   Lucas Calderon today has no headache,no chest abdominal pain,no new weakness tingling or numbness, feels much better wants to go home today. Spoke with sister and brother at bedside,who will be able to provide 24/7 care. Patient is not keen on going to SNF.  Objective:   Blood pressure 142/63, pulse 73,  temperature 98.7 F (37.1 C), temperature source Oral, resp. rate 18, height 5\' 8"  (1.727 m), weight 75.751 kg (167 lb), SpO2 99.00%.  Intake/Output Summary (Last 24 hours) at 09/30/13 1115 Last data filed at 09/30/13 0619  Gross per 24 hour  Intake    642 ml  Output   1825 ml  Net  -1183 ml  Filed Weights   09/28/13 0504 09/29/13 0412 09/30/13 0500  Weight: 77.701 kg (171 lb 4.8 oz) 76.5 kg (168 lb 10.4 oz) 75.751 kg (167 lb)    Exam Awake Alert, Oriented *3, No new F.N deficits, Normal affect [place].AT,PERRAL Supple Neck,No JVD, No cervical lymphadenopathy appriciated.  Symmetrical Chest wall movement, Good air movement bilaterally, CTAB RRR,No Gallops,Rubs or new Murmurs, No Parasternal Heave +ve B.Sounds, Abd Soft, Non tender, No organomegaly appriciated, No rebound -guarding or rigidity. No Cyanosis, Clubbing or edema, No new Rash or bruise  DISCHARGE CONDITION: Stable  DISPOSITION: Home  DISCHARGE INSTRUCTIONS:    Activity:  As tolerated with Full fall precautions use walker/cane & assistance as needed  Diet recommendation: Diabetic Diet Heart Healthy diet      Discharge Orders   Future Appointments Provider Department Dept Phone   11/03/2013 2:00 PM [facility] [place] [phone]   11/03/2013 3:15 PM Conrad [place], MD [facility] [place] [phone]   Future Orders Complete By Expires   Call MD for:  difficulty breathing, headache or visual disturbances  As directed    Call MD for:  temperature >100.4  As directed    Diet - low sodium heart healthy  As directed    Diet Carb Modified  As directed    Increase activity slowly  As directed       Follow-up Information   Follow up with HODGES,FRANCISCO, MD. Schedule an appointment as soon as possible for a visit in 1 week.   Specialty:  Family Medicine      Follow up with [facility]. (keep appt with your Nephrologist at your regular appointment.)     Contact information:   [place] [address] [phone]         Total Time spent on discharge equals 45 minutes.  SignedOren Binet 09/30/2013 11:15 AM

## 2013-09-30 NOTE — Progress Notes (Signed)
NURSING PROGRESS NOTE  Male Me[facility] [id number] Discharge Data: 09/30/2013 3:55 PM Attending Provider: Jonetta Osgood, MD [id number], MD     Phylis Bougie to be D/C'd Home per MD order.  Discussed with the patient the After Visit Summary and all questions fully answered. All IV's discontinued with no bleeding noted. All belongings returned to patient for patient to take home. Patient refused home health arrangements with Care Management. MD aware.   Last Vital Signs:  Blood pressure 128/72, pulse 66, temperature 98.5 F (36.9 C), temperature source Oral, resp. rate 18, height 5\' 8"  (1.727 m), weight 75.751 kg (167 lb), SpO2 96.00%.  Discharge Medication List   Medication List    STOP taking these medications       torsemide 20 MG tablet  Commonly known as:  DEMADEX      TAKE these medications       amLODipine 10 MG tablet  Commonly known as:  NORVASC  Take 10 mg by mouth daily.     aspirin 81 MG tablet  Take 81 mg by mouth daily.     B-complex with vitamin C tablet  Take 1 tablet by mouth daily.     calcitRIOL 0.25 MCG capsule  Commonly known as:  ROCALTROL  Take 1 capsule (0.25 mcg total) by mouth daily.     calcium acetate 667 MG capsule  Commonly known as:  PHOSLO  Take 1 capsule (667 mg total) by mouth 3 (three) times daily with meals.     carvedilol 12.5 MG tablet  Commonly known as:  COREG  Take 12.5 mg by mouth 2 (two) times daily with a meal.     ferrous sulfate 325 (65 FE) MG EC tablet  Take 325 mg by mouth 2 (two) times daily.     furosemide 80 MG tablet  Commonly known as:  LASIX  Take 1 tablet (80 mg total) by mouth daily.     hydrALAZINE 50 MG tablet  Commonly known as:  APRESOLINE  Take 75 mg by mouth 4 (four) times daily.     insulin glargine 100 UNIT/ML injection  Commonly known as:  LANTUS  Inject 0.2 mLs (20 Units total) into the skin at bedtime.     isosorbide mononitrate 60 MG 24 hr tablet  Commonly known as:  IMDUR  Take  60 mg by mouth daily.     levofloxacin 500 MG tablet  Commonly known as:  LEVAQUIN  Take 1 tablet (500 mg total) by mouth every other day.     PROVENTIL HFA 108 (90 BASE) MCG/ACT inhaler  Generic drug:  albuterol  Inhale 2 puffs into the lungs every 6 (six) hours as needed for wheezing or shortness of breath.         Wallie Renshaw, RN

## 2013-10-02 LAB — CULTURE, BLOOD (ROUTINE X 2)
CULTURE: NO GROWTH
Culture: NO GROWTH

## 2013-10-04 LAB — CULTURE, BLOOD (ROUTINE X 2)
Culture: NO GROWTH
Culture: NO GROWTH

## 2013-10-05 DIAGNOSIS — E1165 Type 2 diabetes mellitus with hyperglycemia: Secondary | ICD-10-CM | POA: Diagnosis not present

## 2013-10-05 DIAGNOSIS — J189 Pneumonia, unspecified organism: Secondary | ICD-10-CM | POA: Diagnosis not present

## 2013-10-05 DIAGNOSIS — IMO0002 Reserved for concepts with insufficient information to code with codable children: Secondary | ICD-10-CM | POA: Diagnosis not present

## 2013-10-05 DIAGNOSIS — N19 Unspecified kidney failure: Secondary | ICD-10-CM | POA: Diagnosis not present

## 2013-10-05 DIAGNOSIS — I150 Renovascular hypertension: Secondary | ICD-10-CM | POA: Diagnosis not present

## 2013-10-25 DIAGNOSIS — R269 Unspecified abnormalities of gait and mobility: Secondary | ICD-10-CM | POA: Diagnosis not present

## 2013-10-25 DIAGNOSIS — N189 Chronic kidney disease, unspecified: Secondary | ICD-10-CM | POA: Diagnosis not present

## 2013-10-25 DIAGNOSIS — I509 Heart failure, unspecified: Secondary | ICD-10-CM | POA: Diagnosis not present

## 2013-10-25 DIAGNOSIS — IMO0001 Reserved for inherently not codable concepts without codable children: Secondary | ICD-10-CM | POA: Diagnosis not present

## 2013-10-25 DIAGNOSIS — I150 Renovascular hypertension: Secondary | ICD-10-CM | POA: Diagnosis not present

## 2013-10-26 DIAGNOSIS — I509 Heart failure, unspecified: Secondary | ICD-10-CM | POA: Diagnosis not present

## 2013-10-26 DIAGNOSIS — R269 Unspecified abnormalities of gait and mobility: Secondary | ICD-10-CM | POA: Diagnosis not present

## 2013-10-26 DIAGNOSIS — I150 Renovascular hypertension: Secondary | ICD-10-CM | POA: Diagnosis not present

## 2013-10-26 DIAGNOSIS — IMO0001 Reserved for inherently not codable concepts without codable children: Secondary | ICD-10-CM | POA: Diagnosis not present

## 2013-10-26 DIAGNOSIS — N189 Chronic kidney disease, unspecified: Secondary | ICD-10-CM | POA: Diagnosis not present

## 2013-10-28 DIAGNOSIS — I509 Heart failure, unspecified: Secondary | ICD-10-CM | POA: Diagnosis not present

## 2013-10-28 DIAGNOSIS — I150 Renovascular hypertension: Secondary | ICD-10-CM | POA: Diagnosis not present

## 2013-10-28 DIAGNOSIS — R269 Unspecified abnormalities of gait and mobility: Secondary | ICD-10-CM | POA: Diagnosis not present

## 2013-10-28 DIAGNOSIS — N189 Chronic kidney disease, unspecified: Secondary | ICD-10-CM | POA: Diagnosis not present

## 2013-10-28 DIAGNOSIS — IMO0001 Reserved for inherently not codable concepts without codable children: Secondary | ICD-10-CM | POA: Diagnosis not present

## 2013-10-30 DIAGNOSIS — N189 Chronic kidney disease, unspecified: Secondary | ICD-10-CM | POA: Diagnosis not present

## 2013-10-30 DIAGNOSIS — R269 Unspecified abnormalities of gait and mobility: Secondary | ICD-10-CM | POA: Diagnosis not present

## 2013-10-30 DIAGNOSIS — IMO0001 Reserved for inherently not codable concepts without codable children: Secondary | ICD-10-CM | POA: Diagnosis not present

## 2013-10-30 DIAGNOSIS — I150 Renovascular hypertension: Secondary | ICD-10-CM | POA: Diagnosis not present

## 2013-10-30 DIAGNOSIS — I509 Heart failure, unspecified: Secondary | ICD-10-CM | POA: Diagnosis not present

## 2013-10-31 DIAGNOSIS — N189 Chronic kidney disease, unspecified: Secondary | ICD-10-CM | POA: Diagnosis not present

## 2013-10-31 DIAGNOSIS — R269 Unspecified abnormalities of gait and mobility: Secondary | ICD-10-CM | POA: Diagnosis not present

## 2013-10-31 DIAGNOSIS — I150 Renovascular hypertension: Secondary | ICD-10-CM | POA: Diagnosis not present

## 2013-10-31 DIAGNOSIS — I509 Heart failure, unspecified: Secondary | ICD-10-CM | POA: Diagnosis not present

## 2013-10-31 DIAGNOSIS — IMO0001 Reserved for inherently not codable concepts without codable children: Secondary | ICD-10-CM | POA: Diagnosis not present

## 2013-11-01 DIAGNOSIS — R269 Unspecified abnormalities of gait and mobility: Secondary | ICD-10-CM | POA: Diagnosis not present

## 2013-11-01 DIAGNOSIS — I509 Heart failure, unspecified: Secondary | ICD-10-CM | POA: Diagnosis not present

## 2013-11-01 DIAGNOSIS — IMO0001 Reserved for inherently not codable concepts without codable children: Secondary | ICD-10-CM | POA: Diagnosis not present

## 2013-11-01 DIAGNOSIS — I150 Renovascular hypertension: Secondary | ICD-10-CM | POA: Diagnosis not present

## 2013-11-01 DIAGNOSIS — N189 Chronic kidney disease, unspecified: Secondary | ICD-10-CM | POA: Diagnosis not present

## 2013-11-02 ENCOUNTER — Encounter: Payer: Self-pay | Admitting: Vascular Surgery

## 2013-11-02 DIAGNOSIS — I150 Renovascular hypertension: Secondary | ICD-10-CM | POA: Diagnosis not present

## 2013-11-02 DIAGNOSIS — IMO0001 Reserved for inherently not codable concepts without codable children: Secondary | ICD-10-CM | POA: Diagnosis not present

## 2013-11-02 DIAGNOSIS — I509 Heart failure, unspecified: Secondary | ICD-10-CM | POA: Diagnosis not present

## 2013-11-02 DIAGNOSIS — R269 Unspecified abnormalities of gait and mobility: Secondary | ICD-10-CM | POA: Diagnosis not present

## 2013-11-02 DIAGNOSIS — N189 Chronic kidney disease, unspecified: Secondary | ICD-10-CM | POA: Diagnosis not present

## 2013-11-03 ENCOUNTER — Encounter: Payer: Self-pay | Admitting: Vascular Surgery

## 2013-11-03 ENCOUNTER — Ambulatory Visit (INDEPENDENT_AMBULATORY_CARE_PROVIDER_SITE_OTHER): Payer: Self-pay | Admitting: Vascular Surgery

## 2013-11-03 ENCOUNTER — Ambulatory Visit (HOSPITAL_COMMUNITY)
Admission: RE | Admit: 2013-11-03 | Discharge: 2013-11-03 | Disposition: A | Discharge status: Home / Self Care | Payer: Medicare Other | Source: Ambulatory Visit | Attending: Vascular Surgery | Admitting: Vascular Surgery

## 2013-11-03 VITALS — BP 155/70 | HR 75 | Ht 68.0 in | Wt 177.0 lb

## 2013-11-03 DIAGNOSIS — N186 End stage renal disease: Secondary | ICD-10-CM | POA: Insufficient documentation

## 2013-11-03 DIAGNOSIS — N185 Chronic kidney disease, stage 5: Secondary | ICD-10-CM

## 2013-11-03 DIAGNOSIS — Z4931 Encounter for adequacy testing for hemodialysis: Secondary | ICD-10-CM

## 2013-11-03 NOTE — Progress Notes (Signed)
    Postoperative Access Visit   History of Present Illness  Lucas Calderon is a 73 y.o. year old male who presents for postoperative follow-up for: L RC AVF (Date: 11/03/13).  The patient's wounds are healed.  The patient notes no steal symptoms.  The patient is able to complete their activities of daily living.  The patient's current symptoms are: none.  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 year old male who presents s/p L RC AVF.  The patient's access is ready for use.  Thank you for allowing [place] to participate in this patient's care.  Adele Barthel, MD [facility] Office: [phone] Pager: [phone]  11/03/2013, 2:50 PM

## 2013-11-07 DIAGNOSIS — IMO0001 Reserved for inherently not codable concepts without codable children: Secondary | ICD-10-CM | POA: Diagnosis not present

## 2013-11-07 DIAGNOSIS — I150 Renovascular hypertension: Secondary | ICD-10-CM | POA: Diagnosis not present

## 2013-11-07 DIAGNOSIS — I509 Heart failure, unspecified: Secondary | ICD-10-CM | POA: Diagnosis not present

## 2013-11-07 DIAGNOSIS — R269 Unspecified abnormalities of gait and mobility: Secondary | ICD-10-CM | POA: Diagnosis not present

## 2013-11-07 DIAGNOSIS — N189 Chronic kidney disease, unspecified: Secondary | ICD-10-CM | POA: Diagnosis not present

## 2013-11-10 DIAGNOSIS — IMO0001 Reserved for inherently not codable concepts without codable children: Secondary | ICD-10-CM | POA: Diagnosis not present

## 2013-11-10 DIAGNOSIS — R269 Unspecified abnormalities of gait and mobility: Secondary | ICD-10-CM | POA: Diagnosis not present

## 2013-11-10 DIAGNOSIS — I150 Renovascular hypertension: Secondary | ICD-10-CM | POA: Diagnosis not present

## 2013-11-10 DIAGNOSIS — N189 Chronic kidney disease, unspecified: Secondary | ICD-10-CM | POA: Diagnosis not present

## 2013-11-10 DIAGNOSIS — I509 Heart failure, unspecified: Secondary | ICD-10-CM | POA: Diagnosis not present

## 2013-11-14 DIAGNOSIS — I509 Heart failure, unspecified: Secondary | ICD-10-CM | POA: Diagnosis not present

## 2013-11-14 DIAGNOSIS — I150 Renovascular hypertension: Secondary | ICD-10-CM | POA: Diagnosis not present

## 2013-11-14 DIAGNOSIS — R269 Unspecified abnormalities of gait and mobility: Secondary | ICD-10-CM | POA: Diagnosis not present

## 2013-11-14 DIAGNOSIS — IMO0001 Reserved for inherently not codable concepts without codable children: Secondary | ICD-10-CM | POA: Diagnosis not present

## 2013-11-14 DIAGNOSIS — N189 Chronic kidney disease, unspecified: Secondary | ICD-10-CM | POA: Diagnosis not present

## 2013-11-15 DIAGNOSIS — I509 Heart failure, unspecified: Secondary | ICD-10-CM | POA: Diagnosis not present

## 2013-11-15 DIAGNOSIS — R269 Unspecified abnormalities of gait and mobility: Secondary | ICD-10-CM | POA: Diagnosis not present

## 2013-11-15 DIAGNOSIS — N189 Chronic kidney disease, unspecified: Secondary | ICD-10-CM | POA: Diagnosis not present

## 2013-11-15 DIAGNOSIS — I150 Renovascular hypertension: Secondary | ICD-10-CM | POA: Diagnosis not present

## 2013-11-15 DIAGNOSIS — IMO0001 Reserved for inherently not codable concepts without codable children: Secondary | ICD-10-CM | POA: Diagnosis not present

## 2013-11-16 DIAGNOSIS — I509 Heart failure, unspecified: Secondary | ICD-10-CM | POA: Diagnosis not present

## 2013-11-16 DIAGNOSIS — IMO0001 Reserved for inherently not codable concepts without codable children: Secondary | ICD-10-CM | POA: Diagnosis not present

## 2013-11-16 DIAGNOSIS — R269 Unspecified abnormalities of gait and mobility: Secondary | ICD-10-CM | POA: Diagnosis not present

## 2013-11-16 DIAGNOSIS — I150 Renovascular hypertension: Secondary | ICD-10-CM | POA: Diagnosis not present

## 2013-11-16 DIAGNOSIS — N189 Chronic kidney disease, unspecified: Secondary | ICD-10-CM | POA: Diagnosis not present

## 2013-11-17 ENCOUNTER — Emergency Department (HOSPITAL_COMMUNITY): Payer: Medicare Other

## 2013-11-17 ENCOUNTER — Inpatient Hospital Stay (HOSPITAL_COMMUNITY)
Admission: EM | Admit: 2013-11-17 | Discharge: 2013-11-22 | DRG: 682 | Disposition: A | Discharge status: Home / Self Care | Payer: Medicare Other | Attending: Internal Medicine | Admitting: Internal Medicine

## 2013-11-17 ENCOUNTER — Encounter (HOSPITAL_COMMUNITY): Payer: Self-pay | Admitting: Emergency Medicine

## 2013-11-17 DIAGNOSIS — N179 Acute kidney failure, unspecified: Secondary | ICD-10-CM | POA: Diagnosis not present

## 2013-11-17 DIAGNOSIS — M949 Disorder of cartilage, unspecified: Secondary | ICD-10-CM | POA: Diagnosis present

## 2013-11-17 DIAGNOSIS — R4789 Other speech disturbances: Secondary | ICD-10-CM | POA: Diagnosis present

## 2013-11-17 DIAGNOSIS — R4182 Altered mental status, unspecified: Secondary | ICD-10-CM | POA: Diagnosis not present

## 2013-11-17 DIAGNOSIS — E119 Type 2 diabetes mellitus without complications: Secondary | ICD-10-CM

## 2013-11-17 DIAGNOSIS — I120 Hypertensive chronic kidney disease with stage 5 chronic kidney disease or end stage renal disease: Secondary | ICD-10-CM | POA: Diagnosis present

## 2013-11-17 DIAGNOSIS — E1129 Type 2 diabetes mellitus with other diabetic kidney complication: Secondary | ICD-10-CM | POA: Diagnosis present

## 2013-11-17 DIAGNOSIS — M899 Disorder of bone, unspecified: Secondary | ICD-10-CM | POA: Diagnosis present

## 2013-11-17 DIAGNOSIS — N185 Chronic kidney disease, stage 5: Secondary | ICD-10-CM | POA: Diagnosis not present

## 2013-11-17 DIAGNOSIS — R5381 Other malaise: Secondary | ICD-10-CM | POA: Diagnosis not present

## 2013-11-17 DIAGNOSIS — E162 Hypoglycemia, unspecified: Secondary | ICD-10-CM | POA: Diagnosis present

## 2013-11-17 DIAGNOSIS — Z9181 History of falling: Secondary | ICD-10-CM | POA: Diagnosis not present

## 2013-11-17 DIAGNOSIS — Z8249 Family history of ischemic heart disease and other diseases of the circulatory system: Secondary | ICD-10-CM | POA: Diagnosis not present

## 2013-11-17 DIAGNOSIS — N2581 Secondary hyperparathyroidism of renal origin: Secondary | ICD-10-CM | POA: Diagnosis not present

## 2013-11-17 DIAGNOSIS — E1169 Type 2 diabetes mellitus with other specified complication: Secondary | ICD-10-CM | POA: Diagnosis present

## 2013-11-17 DIAGNOSIS — I129 Hypertensive chronic kidney disease with stage 1 through stage 4 chronic kidney disease, or unspecified chronic kidney disease: Secondary | ICD-10-CM | POA: Diagnosis not present

## 2013-11-17 DIAGNOSIS — E876 Hypokalemia: Secondary | ICD-10-CM | POA: Diagnosis not present

## 2013-11-17 DIAGNOSIS — Z87891 Personal history of nicotine dependence: Secondary | ICD-10-CM

## 2013-11-17 DIAGNOSIS — R5383 Other fatigue: Secondary | ICD-10-CM | POA: Diagnosis not present

## 2013-11-17 DIAGNOSIS — I519 Heart disease, unspecified: Secondary | ICD-10-CM | POA: Diagnosis present

## 2013-11-17 DIAGNOSIS — G9340 Encephalopathy, unspecified: Secondary | ICD-10-CM

## 2013-11-17 DIAGNOSIS — T50995A Adverse effect of other drugs, medicaments and biological substances, initial encounter: Secondary | ICD-10-CM | POA: Diagnosis present

## 2013-11-17 DIAGNOSIS — N186 End stage renal disease: Secondary | ICD-10-CM

## 2013-11-17 DIAGNOSIS — Z7982 Long term (current) use of aspirin: Secondary | ICD-10-CM

## 2013-11-17 DIAGNOSIS — Z833 Family history of diabetes mellitus: Secondary | ICD-10-CM | POA: Diagnosis not present

## 2013-11-17 DIAGNOSIS — Z79899 Other long term (current) drug therapy: Secondary | ICD-10-CM | POA: Diagnosis not present

## 2013-11-17 DIAGNOSIS — I10 Essential (primary) hypertension: Secondary | ICD-10-CM

## 2013-11-17 DIAGNOSIS — E11649 Type 2 diabetes mellitus with hypoglycemia without coma: Secondary | ICD-10-CM

## 2013-11-17 DIAGNOSIS — N058 Unspecified nephritic syndrome with other morphologic changes: Secondary | ICD-10-CM | POA: Diagnosis present

## 2013-11-17 DIAGNOSIS — N039 Chronic nephritic syndrome with unspecified morphologic changes: Secondary | ICD-10-CM

## 2013-11-17 DIAGNOSIS — K219 Gastro-esophageal reflux disease without esophagitis: Secondary | ICD-10-CM | POA: Diagnosis present

## 2013-11-17 DIAGNOSIS — N19 Unspecified kidney failure: Secondary | ICD-10-CM

## 2013-11-17 DIAGNOSIS — N189 Chronic kidney disease, unspecified: Secondary | ICD-10-CM | POA: Diagnosis not present

## 2013-11-17 DIAGNOSIS — D631 Anemia in chronic kidney disease: Secondary | ICD-10-CM | POA: Diagnosis not present

## 2013-11-17 DIAGNOSIS — T452X5A Adverse effect of vitamins, initial encounter: Secondary | ICD-10-CM | POA: Diagnosis present

## 2013-11-17 DIAGNOSIS — N184 Chronic kidney disease, stage 4 (severe): Secondary | ICD-10-CM | POA: Diagnosis not present

## 2013-11-17 DIAGNOSIS — F29 Unspecified psychosis not due to a substance or known physiological condition: Secondary | ICD-10-CM | POA: Diagnosis not present

## 2013-11-17 LAB — URINALYSIS, ROUTINE W REFLEX MICROSCOPIC
Bilirubin Urine: NEGATIVE
Glucose, UA: NEGATIVE mg/dL
Ketones, ur: NEGATIVE mg/dL
LEUKOCYTES UA: NEGATIVE
NITRITE: NEGATIVE
PROTEIN: 100 mg/dL — AB
Specific Gravity, Urine: 1.014 (ref 1.005–1.030)
Urobilinogen, UA: 0.2 mg/dL (ref 0.0–1.0)
pH: 5.5 (ref 5.0–8.0)

## 2013-11-17 LAB — COMPREHENSIVE METABOLIC PANEL
ALT: 26 U/L (ref 0–53)
AST: 38 U/L — ABNORMAL HIGH (ref 0–37)
Albumin: 3.5 g/dL (ref 3.5–5.2)
Alkaline Phosphatase: 54 U/L (ref 39–117)
BILIRUBIN TOTAL: 0.4 mg/dL (ref 0.3–1.2)
BUN: 68 mg/dL — AB (ref 6–23)
CHLORIDE: 103 meq/L (ref 96–112)
CO2: 26 mEq/L (ref 19–32)
CREATININE: 7.12 mg/dL — AB (ref 0.50–1.35)
Calcium: 13.2 mg/dL (ref 8.4–10.5)
GFR calc non Af Amer: 7 mL/min — ABNORMAL LOW (ref >90)
GFR, EST AFRICAN AMERICAN: 8 mL/min — AB (ref >90)
GLUCOSE: 133 mg/dL — AB (ref 70–99)
POTASSIUM: 4.1 meq/L (ref 3.7–5.3)
Sodium: 144 mEq/L (ref 137–147)
TOTAL PROTEIN: 7.7 g/dL (ref 6.0–8.3)

## 2013-11-17 LAB — CBC
HEMATOCRIT: 25.2 % — AB (ref 39.0–52.0)
HEMOGLOBIN: 8.6 g/dL — AB (ref 13.0–17.0)
MCH: 28.8 pg (ref 26.0–34.0)
MCHC: 34.1 g/dL (ref 30.0–36.0)
MCV: 84.3 fL (ref 78.0–100.0)
Platelets: 189 10*3/uL (ref 150–400)
RBC: 2.99 MIL/uL — ABNORMAL LOW (ref 4.22–5.81)
RDW: 15.1 % (ref 11.5–15.5)
WBC: 10.1 10*3/uL (ref 4.0–10.5)

## 2013-11-17 LAB — IRON AND TIBC
Iron: 47 ug/dL (ref 42–135)
Saturation Ratios: 21 % (ref 20–55)
TIBC: 228 ug/dL (ref 215–435)
UIBC: 181 ug/dL (ref 125–400)

## 2013-11-17 LAB — URINE MICROSCOPIC-ADD ON

## 2013-11-17 LAB — GLUCOSE, CAPILLARY: GLUCOSE-CAPILLARY: 118 mg/dL — AB (ref 70–99)

## 2013-11-17 MED ORDER — ALBUTEROL SULFATE HFA 108 (90 BASE) MCG/ACT IN AERS: 2.0000 | INHALATION_SPRAY | Freq: Four times a day (QID) | RESPIRATORY_TRACT | Status: DC | PRN

## 2013-11-17 MED ORDER — ASPIRIN EC 81 MG PO TBEC
81.0000 mg | DELAYED_RELEASE_TABLET | Freq: Every day | ORAL | Status: DC
  Administered 2013-11-18 – 2013-11-22 (×5): 81 mg via ORAL
  Filled 2013-11-17 (×5): qty 1

## 2013-11-17 MED ORDER — RENA-VITE PO TABS
1.0000 | ORAL_TABLET | Freq: Every day | ORAL | Status: DC
  Administered 2013-11-17 – 2013-11-21 (×5): 1 via ORAL
  Filled 2013-11-17 (×6): qty 1

## 2013-11-17 MED ORDER — AMLODIPINE BESYLATE 10 MG PO TABS
10.0000 mg | ORAL_TABLET | Freq: Every day | ORAL | Status: DC
  Administered 2013-11-18 – 2013-11-21 (×4): 10 mg via ORAL
  Filled 2013-11-17 (×5): qty 1

## 2013-11-17 MED ORDER — SODIUM CHLORIDE 0.9 % IV BOLUS (SEPSIS)
500.0000 mL | Freq: Once | INTRAVENOUS | Status: AC
  Administered 2013-11-17: 500 mL via INTRAVENOUS

## 2013-11-17 MED ORDER — AMLODIPINE BESYLATE 10 MG PO TABS: 10.0000 mg | ORAL_TABLET | Freq: Every day | ORAL | Status: DC

## 2013-11-17 MED ORDER — CALCIUM ACETATE 667 MG PO CAPS: 1334.0000 mg | ORAL_CAPSULE | Freq: Three times a day (TID) | ORAL | Status: DC

## 2013-11-17 MED ORDER — SODIUM CHLORIDE 0.9 % IJ SOLN
3.0000 mL | Freq: Two times a day (BID) | INTRAMUSCULAR | Status: DC
  Administered 2013-11-17 – 2013-11-22 (×4): 3 mL via INTRAVENOUS

## 2013-11-17 MED ORDER — ALBUTEROL SULFATE (2.5 MG/3ML) 0.083% IN NEBU: 2.5000 mg | INHALATION_SOLUTION | Freq: Four times a day (QID) | RESPIRATORY_TRACT | Status: DC | PRN

## 2013-11-17 MED ORDER — VITA-BEE/C PO TABS
1.0000 | ORAL_TABLET | Freq: Every day | ORAL | Status: DC
Start: 2013-11-17 — End: 2013-11-17

## 2013-11-17 MED ORDER — CALCITONIN (SALMON) 200 UNIT/ML IJ SOLN
200.0000 [IU] | Freq: Once | INTRAMUSCULAR | Status: DC
  Filled 2013-11-17: qty 1

## 2013-11-17 MED ORDER — SODIUM CHLORIDE 0.9 % IV SOLN
INTRAVENOUS | Status: DC
  Administered 2013-11-17 – 2013-11-19 (×2): via INTRAVENOUS

## 2013-11-17 MED ORDER — FUROSEMIDE 20 MG PO TABS: 80.0000 mg | ORAL_TABLET | Freq: Every day | ORAL | Status: DC

## 2013-11-17 MED ORDER — ISOSORBIDE MONONITRATE ER 60 MG PO TB24
60.0000 mg | ORAL_TABLET | Freq: Every day | ORAL | Status: DC
  Administered 2013-11-18 – 2013-11-22 (×5): 60 mg via ORAL
  Filled 2013-11-17 (×5): qty 1

## 2013-11-17 MED ORDER — HYDRALAZINE HCL 50 MG PO TABS
75.0000 mg | ORAL_TABLET | Freq: Three times a day (TID) | ORAL | Status: DC
  Administered 2013-11-18 – 2013-11-22 (×12): 75 mg via ORAL
  Filled 2013-11-17 (×16): qty 1

## 2013-11-17 MED ORDER — ASPIRIN 81 MG PO TABS: 81.0000 mg | ORAL_TABLET | Freq: Every day | ORAL | Status: DC

## 2013-11-17 MED ORDER — CALCITRIOL 0.25 MCG PO CAPS: 0.2500 ug | ORAL_CAPSULE | Freq: Every day | ORAL | Status: DC

## 2013-11-17 MED ORDER — FUROSEMIDE 80 MG PO TABS
80.0000 mg | ORAL_TABLET | Freq: Two times a day (BID) | ORAL | Status: DC
Start: 2013-11-18 — End: 2013-11-22
  Administered 2013-11-18 – 2013-11-22 (×9): 80 mg via ORAL
  Filled 2013-11-17 (×11): qty 1

## 2013-11-17 MED ORDER — INSULIN GLARGINE 100 UNIT/ML ~~LOC~~ SOLN
20.0000 [IU] | Freq: Every day | SUBCUTANEOUS | Status: DC
  Administered 2013-11-17 – 2013-11-18 (×2): 20 [IU] via SUBCUTANEOUS
  Filled 2013-11-17 (×3): qty 0.2

## 2013-11-17 MED ORDER — DARBEPOETIN ALFA-POLYSORBATE 200 MCG/0.4ML IJ SOLN
200.0000 ug | Freq: Once | INTRAMUSCULAR | Status: AC
  Administered 2013-11-18: 200 ug via SUBCUTANEOUS
  Filled 2013-11-17: qty 0.4

## 2013-11-17 MED ORDER — HYDRALAZINE HCL 50 MG PO TABS: 75.0000 mg | ORAL_TABLET | Freq: Four times a day (QID) | ORAL | Status: DC

## 2013-11-17 MED ORDER — INSULIN ASPART 100 UNIT/ML ~~LOC~~ SOLN: 0.0000 [IU] | Freq: Three times a day (TID) | SUBCUTANEOUS | Status: DC

## 2013-11-17 MED ORDER — HEPARIN SODIUM (PORCINE) 5000 UNIT/ML IJ SOLN
5000.0000 [IU] | Freq: Three times a day (TID) | INTRAMUSCULAR | Status: DC
  Administered 2013-11-17 – 2013-11-22 (×13): 5000 [IU] via SUBCUTANEOUS
  Filled 2013-11-17 (×17): qty 1

## 2013-11-17 MED ORDER — CALCITONIN (SALMON) 200 UNIT/ML IJ SOLN
400.0000 [IU] | INTRAMUSCULAR | Status: AC
  Administered 2013-11-17 – 2013-11-20 (×4): 400 [IU] via INTRAMUSCULAR
  Filled 2013-11-17 (×5): qty 2

## 2013-11-17 MED ORDER — CARVEDILOL 12.5 MG PO TABS
12.5000 mg | ORAL_TABLET | Freq: Two times a day (BID) | ORAL | Status: DC
  Administered 2013-11-18 – 2013-11-22 (×10): 12.5 mg via ORAL
  Filled 2013-11-17 (×11): qty 1

## 2013-11-17 MED ORDER — FERROUS SULFATE 325 (65 FE) MG PO TBEC: 325.0000 mg | DELAYED_RELEASE_TABLET | Freq: Every day | ORAL | Status: DC

## 2013-11-17 NOTE — Consult Note (Signed)
Reason for Consult:CKD5 with worsening Referring Physician: Sheila Gervasi is an 73 y.o. male.  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  Apparently over past week has been confused (family not present).  Hx of DM but he does not know bs. Does not know meds or if taking.  No SOB, cough, N,V, D, cramps, itching according to him.  Does not know why he is here.  Found to have Cr 7.12 and Ca 13.2. Has been on Phoslo and Calcitriol. Review of systems not obtained due to patient factors. Had AVF placed 09/11/13   . Primary Nephrologist [facility]  Access  LLA avf  Dr. Bridgett Larsson 2.13.15.  Past Medical History  Diagnosis Date  . Anemia   . Diabetes mellitus without complication   . Chronic kidney disease   . Pneumonia   . Hypertension   . GERD (gastroesophageal reflux disease)     Past Surgical History  Procedure Laterality Date  . Appendectomy    . Av fistula placement Left 09/11/2013    Procedure:  CREATION- RADIOCEPHALIC arteriovenous fistula;  Surgeon: Conrad Panola, MD;  Location: [facility];  Service: Vascular;  Laterality: Left;    Family History  Problem Relation Age of Onset  . Diabetes Mother   . Heart disease Mother   . Hypertension Mother   . Diabetes Father   . Hypertension Father   . Peripheral vascular disease Father   . Hypertension Sister     Social History:  reports that he has quit smoking. He does not have any smokeless tobacco history on file. He reports that he does not drink alcohol or use illicit drugs.  Allergies:  Allergies  Allergen Reactions  . Penicillins     Unknown    Medications: I have reviewed the patient's current medications. Prior to Admission:  (Not in a hospital admission)  Calcitriol .41mcg.  Results for orders placed during the hospital encounter of 11/17/13 (from the past 48 hour(s))  CBC     Status: Abnormal   Collection Time    11/17/13  3:57 PM      Result Value Ref Range   WBC  10.1  4.0 - 10.5 K/uL   RBC 2.99 (*) 4.22 - 5.81 MIL/uL   Hemoglobin 8.6 (*) 13.0 - 17.0 g/dL   HCT 25.2 (*) 39.0 - 52.0 %   MCV 84.3  78.0 - 100.0 fL   MCH 28.8  26.0 - 34.0 pg   MCHC 34.1  30.0 - 36.0 g/dL   RDW 15.1  11.5 - 15.5 %   Platelets 189  150 - 400 K/uL  COMPREHENSIVE METABOLIC PANEL     Status: Abnormal   Collection Time    11/17/13  3:57 PM      Result Value Ref Range   Sodium 144  137 - 147 mEq/L   Potassium 4.1  3.7 - 5.3 mEq/L   Chloride 103  96 - 112 mEq/L   CO2 26  19 - 32 mEq/L   Glucose, Bld 133 (*) 70 - 99 mg/dL   BUN 68 (*) 6 - 23 mg/dL   Creatinine, Ser 7.12 (*) 0.50 - 1.35 mg/dL   Calcium 13.2 (*) 8.4 - 10.5 mg/dL   Comment: CRITICAL RESULT CALLED TO, READ BACK BY AND VERIFIED WITH:     DR [id number] 11/17/13 D BRADLEY   Total Protein 7.7  6.0 - 8.3 g/dL   Albumin 3.5  3.5 - 5.2 g/dL   AST 38 (*) 0 - 37 U/L   ALT 26  0 - 53 U/L   Alkaline Phosphatase 54  39 - 117 U/L   Total Bilirubin 0.4  0.3 - 1.2 mg/dL   GFR calc non Af Amer 7 (*) >90 mL/min   GFR calc Af Amer 8 (*) >90 mL/min   Comment: (NOTE)     The eGFR has been calculated using the CKD EPI equation.     This calculation has not been validated in all clinical situations.     eGFR's persistently <90 mL/min signify possible Chronic Kidney     Disease.  URINALYSIS, ROUTINE W REFLEX MICROSCOPIC     Status: Abnormal   Collection Time    11/17/13  4:42 PM      Result Value Ref Range   Color, Urine YELLOW  YELLOW   APPearance CLEAR  CLEAR   Specific Gravity, Urine 1.014  1.005 - 1.030   pH 5.5  5.0 - 8.0   Glucose, UA NEGATIVE  NEGATIVE mg/dL   Hgb urine dipstick TRACE (*) NEGATIVE   Bilirubin Urine NEGATIVE  NEGATIVE   Ketones, ur NEGATIVE  NEGATIVE mg/dL   Protein, ur 100 (*) NEGATIVE mg/dL   Urobilinogen, UA 0.2  0.0 - 1.0 mg/dL   Nitrite NEGATIVE  NEGATIVE   Leukocytes, UA NEGATIVE  NEGATIVE  URINE MICROSCOPIC-ADD ON     Status: Abnormal   Collection Time    11/17/13  4:42 PM      Result  Value Ref Range   Squamous Epithelial / LPF RARE  RARE   WBC, UA 0-2  <3 WBC/hpf   RBC / HPF 0-2  <3 RBC/hpf   Casts HYALINE CASTS (*) NEGATIVE    Ct Head Wo Contrast  11/17/2013   CLINICAL DATA:  confusion  EXAM: CT HEAD WITHOUT CONTRAST  TECHNIQUE: Contiguous axial images were obtained from the base of the skull through the vertex without intravenous contrast.  COMPARISON:  CT HEAD W/O CM dated 11/17/2013  FINDINGS: No acute intracranial abnormality. Specifically, no hemorrhage, hydrocephalus, mass lesion, acute infarction, or significant intracranial injury. No acute calvarial abnormality. Stable mild global atrophy. Mild areas low attenuation within the subcortical, deep, and periventricular white matter regions consistent with small vessel white matter ischemic changes.  IMPRESSION: No acute intracranial abnormality.   Electronically Signed   By: Margaree Mackintosh M.D.   On: 11/17/2013 18:39   Dg Chest Port 1 View  11/17/2013   CLINICAL DATA:  weakness  EXAM: PORTABLE CHEST - 1 VIEW  COMPARISON:  None.  FINDINGS: There is mild elevation of the right hemidiaphragm. Cardiac silhouette is moderately enlarged. Aorta is tortuous with atherosclerotic calcifications. The lungs are clear. Mild degenerative changes within the shoulders.  IMPRESSION: No active disease.   Electronically Signed   By: Margaree Mackintosh M.D.   On: 11/17/2013 16:13    ROS Blood pressure 163/87, pulse 88, temperature 97.6 F (36.4 C), temperature source Rectal, resp. rate 17, SpO2 100.00%. Physical Exam Physical Examination: General appearance - chronically ill appearing and uncooperative smells Mental status - alert, oriented to person, place, and time, uncooperative, inappropriate safety awareness, oriented but slow, and perseverates, and cannot follow 2 step commands and no recent memory.  Eyes - pupils equal and reactive, extraocular eye movements intact Ears - old scarring bilat TMs Mouth - poor dentition Neck - adenopathy  noted PCL Lymphatics - no palpable lymphadenopathy, no hepatosplenomegaly, posterior cervical nodes Chest -  decreased air entry noted bilat Heart - S1 and S2 normal, systolic murmur Q2/2 at 2nd left intercostal space Abdomen - soft, nontender, nondistended, no masses or organomegaly Musculoskeletal - no joint tenderness, deformity or swelling Extremities - AVF LFA, not well developed, bifurc veins, . Tr DP pulses, no edema Skin - dry ,scaling  Assessment/Plan: 1 Confusion ^ Ca and ^ CR.  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  Need VVS to see AVF 3 Hypertension: restart meds 4. Anemia of ESRD: check Fe last was low. Give EPO 5. Metabolic Bone Disease: check PTH 6 DM follow P NS, Lasix, calcitonin, epo, follow Ca, VVS to eval fistula  Joyice Faster Everlean Bucher 11/17/2013, 7:58 PM

## 2013-11-17 NOTE — ED Notes (Signed)
Patient was seen by PCP today and had lab work done.  States that he was sent to [facility] ED for dialysis. Patient has a left forearm graft that id +/+.

## 2013-11-17 NOTE — ED Provider Notes (Signed)
CSN: [id number]     Arrival date & time 11/17/13  1339 History   First MD Initiated Contact with Patient 11/17/13 1500     Chief Complaint  Patient presents with  . Acute Renal Failure   Level V caveat secondary to patient confusion  (Consider location/radiation/quality/duration/timing/severity/associated sxs/prior Treatment) HPI 73 year old male with history of diabetes and hypertension and chronic renal failure who presents today with increasing confusion and lethargy who has reportedly had significantly elevated creatinine at an outlying hospital. His niece and sister are his historians. They say that he was taken into his Dr. Tia Alert today due to the above-mentioned symptoms. She ordered labs and then told them that he needed to come here for dialysis. He had an AV fistula placed in his left forearm several months ago when they report that this has not been accessed but that they were told it was ready for use. He lives with a different niece.  They state that he has been getting increasingly weaker more confused over the past 2 weeks. Do not think that he has had a fever. He has had decreased by mouth intake. Past Medical History  Diagnosis Date  . Anemia   . Diabetes mellitus without complication   . Chronic kidney disease   . Pneumonia   . Hypertension   . GERD (gastroesophageal reflux disease)    Past Surgical History  Procedure Laterality Date  . Appendectomy    . Av fistula placement Left 09/11/2013    Procedure:  CREATION- RADIOCEPHALIC arteriovenous fistula;  Surgeon: Conrad Lovell, MD;  Location: [facility];  Service: Vascular;  Laterality: Left;   Family History  Problem Relation Age of Onset  . Diabetes Mother   . Heart disease Mother   . Hypertension Mother   . Diabetes Father   . Hypertension Father   . Peripheral vascular disease Father   . Hypertension Sister    History  Substance Use Topics  . Smoking status: Former Research scientist (life sciences)  . Smokeless tobacco: Not on file  .  Alcohol Use: No    Review of Systems  Unable to perform ROS     Allergies  Penicillins  Home Medications   Prior to Admission medications   Medication Sig Start Date End Date Taking? Authorizing Provider  albuterol (PROVENTIL HFA) 108 (90 BASE) MCG/ACT inhaler Inhale 2 puffs into the lungs every 6 (six) hours as needed for wheezing or shortness of breath.   Yes Historical Provider, MD  amLODipine (NORVASC) 10 MG tablet Take 10 mg by mouth daily.   Yes Historical Provider, MD  aspirin 81 MG tablet Take 81 mg by mouth daily.   Yes Historical Provider, MD  B Complex-C-Folic Acid (VITA-BEE/C) TABS Take 1 tablet by mouth daily.   Yes Historical Provider, MD  calcitRIOL (ROCALTROL) 0.25 MCG capsule Take 1 capsule (0.25 mcg total) by mouth daily. 09/30/13  Yes Shanker Kristeen Mans, MD  calcium acetate (PHOSLO) 667 MG capsule Take 1,334 mg by mouth 3 (three) times daily with meals.   Yes Historical Provider, MD  carvedilol (COREG) 12.5 MG tablet Take 12.5 mg by mouth 2 (two) times daily with a meal.   Yes Historical Provider, MD  ferrous sulfate 325 (65 FE) MG EC tablet Take 325 mg by mouth daily with breakfast.    Yes Historical Provider, MD  furosemide (LASIX) 80 MG tablet Take 1 tablet (80 mg total) by mouth daily. 09/30/13  Yes Shanker Kristeen Mans, MD  hydrALAZINE (APRESOLINE) 50 MG tablet Take 75  mg by mouth every 6 (six) hours.    Yes Historical Provider, MD  insulin glargine (LANTUS) 100 UNIT/ML injection Inject 0.2 mLs (20 Units total) into the skin at bedtime. 09/30/13  Yes Shanker Kristeen Mans, MD  isosorbide mononitrate (IMDUR) 60 MG 24 hr tablet Take 60 mg by mouth daily.   Yes Historical Provider, MD  NOVOLIN R RELION 100 UNIT/ML injection Inject 0-12 Units into the skin 3 (three) times daily before meals.  10/05/13  Yes Historical Provider, MD   BP 146/60  Pulse 71  Temp(Src) 98.2 F (36.8 C) (Oral)  Resp 18  SpO2 96% Physical Exam  Nursing note and vitals reviewed. Constitutional: He  appears well-developed and well-nourished.  HENT:  Head: Normocephalic and atraumatic.  Right Ear: External ear normal.  Left Ear: External ear normal.  Nose: Nose normal.  Mouth/Throat: Oropharynx is clear and moist.  Eyes: Conjunctivae and EOM are normal. Pupils are equal, round, and reactive to light.  Neck: Normal range of motion. Neck supple.  Cardiovascular: Normal rate, regular rhythm, normal heart sounds and intact distal pulses.   Pulmonary/Chest: Effort normal and breath sounds normal.  Abdominal: Soft. Bowel sounds are normal.  Musculoskeletal: Normal range of motion.  Neurological:  Patient is somnolent but arousable to voice. He is able to tell me his name and date of birth. He is not able to tolerate the year currently. He appears to move all 4 extremities equally. Pupils are equal round and reactive to light  Skin: Skin is warm and dry.    ED Course  Procedures (including critical care time) Labs Review Labs Reviewed  CBC - Abnormal; Notable for the following:    RBC 2.99 (*)    Hemoglobin 8.6 (*)    HCT 25.2 (*)    All other components within normal limits  COMPREHENSIVE METABOLIC PANEL - Abnormal; Notable for the following:    Glucose, Bld 133 (*)    BUN 68 (*)    Creatinine, Ser 7.12 (*)    Calcium 13.2 (*)    AST 38 (*)    GFR calc non Af Amer 7 (*)    GFR calc Af Amer 8 (*)    All other components within normal limits  URINALYSIS, ROUTINE W REFLEX MICROSCOPIC - Abnormal; Notable for the following:    Hgb urine dipstick TRACE (*)    Protein, ur 100 (*)    All other components within normal limits  URINE MICROSCOPIC-ADD ON - Abnormal; Notable for the following:    Casts HYALINE CASTS (*)    All other components within normal limits    Imaging Review Dg Chest Port 1 View  11/17/2013   CLINICAL DATA:  weakness  EXAM: PORTABLE CHEST - 1 VIEW  COMPARISON:  None.  FINDINGS: There is mild elevation of the right hemidiaphragm. Cardiac silhouette is moderately  enlarged. Aorta is tortuous with atherosclerotic calcifications. The lungs are clear. Mild degenerative changes within the shoulders.  IMPRESSION: No active disease.   Electronically Signed   By: Margaree Mackintosh M.D.   On: 11/17/2013 16:13     EKG Interpretation None      MDM   Final diagnoses:  Hypercalcemia  Renal failure  Altered mental state  Patient with known renal failure with increasing weakness over several weeks with increased confusion. Creatinine increased from 5.59 to 7.12- not hyperkalemic or in failure.   Patient with chronic renal failure now with symptomatic hypercalcemia likely now to require dialysis.  Patient with elevated calcium  at 13.2- plan stop exogenous calcium.  EKG without changes but changes in sensorium likely due to hypercalcemia.  IV ns is being administered for volume expansion.  Discussed with Dr. Jimmy Footman - nephrology will see and likely dialyze in am.  Will give calcitonin 200 iu sq.   Dr. Alcario Drought in ed and seeing patient.   CRITICAL CARE Performed by: Shaune Pollack Total critical care time: 40 Critical care time was exclusive of separately billable procedures and treating other patients. Critical care was necessary to treat or prevent imminent or life-threatening deterioration. Critical care was time spent personally by me on the following activities: development of treatment plan with patient and/or surrogate as well as nursing, discussions with consultants, evaluation of patient's response to treatment, examination of patient, obtaining history from patient or surrogate, ordering and performing treatments and interventions, ordering and review of laboratory studies, ordering and review of radiographic studies, pulse oximetry and re-evaluation of patient's condition.   Shaune Pollack, MD 11/17/13 [place]

## 2013-11-17 NOTE — ED Notes (Addendum)
Pt was sent from doctors office for elevated bun and creatinine on lab work in office today. His doctor has been monitoring him for possible need for dialysis since last year. Family states he has "not been acting right' the past several days and their doctor said he may need dialysis now. Pt denies complaints

## 2013-11-17 NOTE — H&P (Signed)
[facility] History and Physical  Lucas Calderon [id number] DOB: Nov 29, 1940 DOA: 11/17/2013  Referring physician: EDP PCP: Maryella Shivers, MD   Chief Complaint: AMS   HPI: Lucas Calderon is a 73 y.o. male who presents to ED today with increasing confusion and lethargy for the past 2 weeks.  He was taken to doctor in [place] today due to above symptoms, she ordered labs which came back demonstrating worsening of his CKD stage 5 and hypercalcemia.  He was sent to [facility] for dialysis.  Review of Systems: Systems reviewed.  As above, otherwise negative  Past Medical History  Diagnosis Date  . Anemia   . Diabetes mellitus without complication   . Chronic kidney disease   . Pneumonia   . Hypertension   . GERD (gastroesophageal reflux disease)    Past Surgical History  Procedure Laterality Date  . Appendectomy    . Av fistula placement Left 09/11/2013    Procedure:  CREATION- RADIOCEPHALIC arteriovenous fistula;  Surgeon: Conrad [place], MD;  Location: [facility];  Service: Vascular;  Laterality: Left;   Social History:  reports that he has quit smoking. He does not have any smokeless tobacco history on file. He reports that he does not drink alcohol or use illicit drugs.  Allergies  Allergen Reactions  . Penicillins     Unknown    Family History  Problem Relation Age of Onset  . Diabetes Mother   . Heart disease Mother   . Hypertension Mother   . Diabetes Father   . Hypertension Father   . Peripheral vascular disease Father   . Hypertension Sister      Prior to Admission medications   Medication Sig Start Date End Date Taking? Authorizing Provider  albuterol (PROVENTIL HFA) 108 (90 BASE) MCG/ACT inhaler Inhale 2 puffs into the lungs every 6 (six) hours as needed for wheezing or shortness of breath.   Yes Historical Provider, MD  amLODipine (NORVASC) 10 MG tablet Take 10 mg by mouth daily.   Yes Historical Provider, MD  aspirin 81 MG tablet Take 81 mg by mouth daily.   Yes  Historical Provider, MD  B Complex-C-Folic Acid (VITA-BEE/C) TABS Take 1 tablet by mouth daily.   Yes Historical Provider, MD  calcitRIOL (ROCALTROL) 0.25 MCG capsule Take 1 capsule (0.25 mcg total) by mouth daily. 09/30/13  Yes Shanker Kristeen Mans, MD  calcium acetate (PHOSLO) 667 MG capsule Take 1,334 mg by mouth 3 (three) times daily with meals.   Yes Historical Provider, MD  carvedilol (COREG) 12.5 MG tablet Take 12.5 mg by mouth 2 (two) times daily with a meal.   Yes Historical Provider, MD  ferrous sulfate 325 (65 FE) MG EC tablet Take 325 mg by mouth daily with breakfast.    Yes Historical Provider, MD  furosemide (LASIX) 80 MG tablet Take 1 tablet (80 mg total) by mouth daily. 09/30/13  Yes Shanker Kristeen Mans, MD  hydrALAZINE (APRESOLINE) 50 MG tablet Take 75 mg by mouth every 6 (six) hours.    Yes Historical Provider, MD  insulin glargine (LANTUS) 100 UNIT/ML injection Inject 0.2 mLs (20 Units total) into the skin at bedtime. 09/30/13  Yes Shanker Kristeen Mans, MD  isosorbide mononitrate (IMDUR) 60 MG 24 hr tablet Take 60 mg by mouth daily.   Yes Historical Provider, MD  NOVOLIN R RELION 100 UNIT/ML injection Inject 0-12 Units into the skin 3 (three) times daily before meals.  10/05/13  Yes Historical Provider, MD   Physical Exam: Filed Vitals:  11/17/13 1647  BP:   Pulse:   Temp: 97.6 F (36.4 C)  Resp:     BP 146/60  Pulse 71  Temp(Src) 97.6 F (36.4 C) (Rectal)  Resp 18  SpO2 96%  General Appearance:    Alert, oriented, no distress, appears stated age  Head:    Normocephalic, atraumatic  Eyes:    PERRL, EOMI, sclera non-icteric        Nose:   Nares without drainage or epistaxis. Mucosa, turbinates normal  Throat:   Moist mucous membranes. Oropharynx without erythema or exudate.  Neck:   Supple. No carotid bruits.  No thyromegaly.  No lymphadenopathy.   Back:     No CVA tenderness, no spinal tenderness  Lungs:     Clear to auscultation bilaterally, without wheezes, rhonchi or  rales  Chest wall:    No tenderness to palpitation  Heart:    Regular rate and rhythm without murmurs, gallops, rubs  Abdomen:     Soft, non-tender, nondistended, normal bowel sounds, no organomegaly  Genitalia:    deferred  Rectal:    deferred  Extremities:   No clubbing, cyanosis or edema.  Pulses:   2+ and symmetric all extremities  Skin:   Skin color, texture, turgor normal, no rashes or lesions  Lymph nodes:   Cervical, supraclavicular, and axillary nodes normal  Neurologic:   CNII-XII intact. Normal strength, sensation and reflexes      throughout    Labs on Admission:  Basic Metabolic Panel:  Recent Labs Lab 11/17/13 1557  NA 144  K 4.1  CL 103  CO2 26  GLUCOSE 133*  BUN 68*  CREATININE 7.12*  CALCIUM 13.2*   Liver Function Tests:  Recent Labs Lab 11/17/13 1557  AST 38*  ALT 26  ALKPHOS 54  BILITOT 0.4  PROT 7.7  ALBUMIN 3.5   No results found for this basename: LIPASE, AMYLASE,  in the last 168 hours No results found for this basename: AMMONIA,  in the last 168 hours CBC:  Recent Labs Lab 11/17/13 1557  WBC 10.1  HGB 8.6*  HCT 25.2*  MCV 84.3  PLT 189   Cardiac Enzymes: No results found for this basename: CKTOTAL, CKMB, CKMBINDEX, TROPONINI,  in the last 168 hours  BNP (last 3 results) No results found for this basename: PROBNP,  in the last 8760 hours CBG: No results found for this basename: GLUCAP,  in the last 168 hours  Radiological Exams on Admission: Ct Head Wo Contrast  11/17/2013   CLINICAL DATA:  confusion  EXAM: CT HEAD WITHOUT CONTRAST  TECHNIQUE: Contiguous axial images were obtained from the base of the skull through the vertex without intravenous contrast.  COMPARISON:  CT HEAD W/O CM dated 11/17/2013  FINDINGS: No acute intracranial abnormality. Specifically, no hemorrhage, hydrocephalus, mass lesion, acute infarction, or significant intracranial injury. No acute calvarial abnormality. Stable mild global atrophy. Mild areas low  attenuation within the subcortical, deep, and periventricular white matter regions consistent with small vessel white matter ischemic changes.  IMPRESSION: No acute intracranial abnormality.   Electronically Signed   By: Margaree Mackintosh M.D.   On: 11/17/2013 18:39   Dg Chest Port 1 View  11/17/2013   CLINICAL DATA:  weakness  EXAM: PORTABLE CHEST - 1 VIEW  COMPARISON:  None.  FINDINGS: There is mild elevation of the right hemidiaphragm. Cardiac silhouette is moderately enlarged. Aorta is tortuous with atherosclerotic calcifications. The lungs are clear. Mild degenerative changes within the shoulders.  IMPRESSION:  No active disease.   Electronically Signed   By: Margaree Mackintosh M.D.   On: 11/17/2013 16:13    EKG: Independently reviewed.  Assessment/Plan Principal Problem:   Hypercalcemia Active Problems:   CKD (chronic kidney disease), stage V   Diabetes mellitus, type 2   Altered mental status - due to hypercalcemia  Hypercalcemia - treating with home calcitrol, and also got a dose of calcitonin in ED, tele monitor, got 500cc bolus NS in ED.  Avoid further PO calcium intake, do not wish to give additional IVF if possible as patient has already demonstrated that he will go into pulmonary edema with fluid overload (admitted to ICU for this in March).  Unfortunately as a result, the inability to correct this electrolyte imbalance medically will likely result in requirement of dialysis.  CKD stage 5 - not yet begun dialysis, but need for dialysis has been expected for a couple of months now, he has a fistula put in place a couple of months ago that per family members that has not been accessed yet but they were told it "is ready for use"  Dr. Jimmy Footman aware of admission, will see patient and likely dialyze in AM.  Code Status: Full Code  Family Communication: No family in room Disposition Plan: Admit to inpatient   Time spent: 80 min  [facility] Pager [phone]  If  7AM-7PM, please contact the day team taking care of the patient [email or web address] Password [facility] 11/17/2013, 7:27 PM

## 2013-11-18 DIAGNOSIS — N185 Chronic kidney disease, stage 5: Secondary | ICD-10-CM | POA: Diagnosis not present

## 2013-11-18 DIAGNOSIS — R4182 Altered mental status, unspecified: Secondary | ICD-10-CM | POA: Diagnosis not present

## 2013-11-18 DIAGNOSIS — D631 Anemia in chronic kidney disease: Secondary | ICD-10-CM | POA: Diagnosis not present

## 2013-11-18 DIAGNOSIS — I129 Hypertensive chronic kidney disease with stage 1 through stage 4 chronic kidney disease, or unspecified chronic kidney disease: Secondary | ICD-10-CM | POA: Diagnosis not present

## 2013-11-18 DIAGNOSIS — N039 Chronic nephritic syndrome with unspecified morphologic changes: Secondary | ICD-10-CM

## 2013-11-18 DIAGNOSIS — N2581 Secondary hyperparathyroidism of renal origin: Secondary | ICD-10-CM | POA: Diagnosis not present

## 2013-11-18 DIAGNOSIS — N189 Chronic kidney disease, unspecified: Secondary | ICD-10-CM

## 2013-11-18 LAB — GLUCOSE, CAPILLARY
GLUCOSE-CAPILLARY: 118 mg/dL — AB (ref 70–99)
Glucose-Capillary: 103 mg/dL — ABNORMAL HIGH (ref 70–99)
Glucose-Capillary: 109 mg/dL — ABNORMAL HIGH (ref 70–99)
Glucose-Capillary: 113 mg/dL — ABNORMAL HIGH (ref 70–99)

## 2013-11-18 LAB — COMPREHENSIVE METABOLIC PANEL
ALK PHOS: 50 U/L (ref 39–117)
ALT: 24 U/L (ref 0–53)
AST: 35 U/L (ref 0–37)
Albumin: 3.2 g/dL — ABNORMAL LOW (ref 3.5–5.2)
BILIRUBIN TOTAL: 0.4 mg/dL (ref 0.3–1.2)
BUN: 65 mg/dL — AB (ref 6–23)
CHLORIDE: 103 meq/L (ref 96–112)
CO2: 23 mEq/L (ref 19–32)
Calcium: 12.1 mg/dL — ABNORMAL HIGH (ref 8.4–10.5)
Creatinine, Ser: 6.88 mg/dL — ABNORMAL HIGH (ref 0.50–1.35)
GFR calc Af Amer: 8 mL/min — ABNORMAL LOW (ref >90)
GFR calc non Af Amer: 7 mL/min — ABNORMAL LOW (ref >90)
GLUCOSE: 121 mg/dL — AB (ref 70–99)
POTASSIUM: 3.7 meq/L (ref 3.7–5.3)
SODIUM: 143 meq/L (ref 137–147)
TOTAL PROTEIN: 7.3 g/dL (ref 6.0–8.3)

## 2013-11-18 LAB — CBC
HEMATOCRIT: 24.2 % — AB (ref 39.0–52.0)
Hemoglobin: 8.5 g/dL — ABNORMAL LOW (ref 13.0–17.0)
MCH: 29.3 pg (ref 26.0–34.0)
MCHC: 35.1 g/dL (ref 30.0–36.0)
MCV: 83.4 fL (ref 78.0–100.0)
PLATELETS: 191 10*3/uL (ref 150–400)
RBC: 2.9 MIL/uL — ABNORMAL LOW (ref 4.22–5.81)
RDW: 15 % (ref 11.5–15.5)
WBC: 10 10*3/uL (ref 4.0–10.5)

## 2013-11-18 LAB — PHOSPHORUS: PHOSPHORUS: 4.5 mg/dL (ref 2.3–4.6)

## 2013-11-18 LAB — HEPATITIS B SURFACE ANTIGEN: HEP B S AG: NEGATIVE

## 2013-11-18 NOTE — Progress Notes (Signed)
INITIAL NUTRITION ASSESSMENT  DOCUMENTATION CODES Per approved criteria  -Not Applicable   INTERVENTION: 1.  General healthful diet; encourage intake of foods and beverages as able.  RD to follow and assess for nutritional adequacy.  Consider supplements based on adequacy of intake.   NUTRITION DIAGNOSIS: Inadequate oral intake related to AMS, confusion as evidenced by family report on admission.   Monitor:  1.  Food/Beverage; pt meeting >/=90% estimated needs with tolerance. 2.  Wt/wt change; monitor trends  Reason for Assessment: MST  73 y.o. male  Admitting Dx: Hypercalcemia  ASSESSMENT: Pt admitted from PCP with hypercalcemia and confusion- indications of worsening renal failure and need for dialysis.  Pt is not on dialysis at home.  He does have AVF as dialysis has been expected.  Pt has resumed PO diet.  RD met with pt who is overall alert, appears oriented, but poor historian.   Pt states he has been eating well and believes he has been gaining weight. Pt states his lives at home with his brother.  States his sister bring meals for them.  Pt initially agrees to nutrition focused physical assessment, however is wrapped tightly in blankets and lying on side.  Despite several attempts to prompt pt, RD is only able to conduct a limited assessment.    Nutrition Focused Physical Exam: Subcutaneous Fat:  Orbital Region: WNL Upper Arm Region: WNL Thoracic and Lumbar Region: unable to assess  Muscle:  Temple Region: WNL Clavicle Bone Region: unable to assess Clavicle and Acromion Bone Region: unable to assess Scapular Bone Region: WNL Dorsal Hand: unable to assess Patellar Region: moderate-severe wasting Anterior Thigh Region: severe wasting Posterior Calf Region: severe wasting  Edema: none present  RD suspects some level of malnutrition given recent wt loss and evidence of muscle wasting, however pt does not meet criteria for severe malnutrition at this time.   Recommend ongoing nutrition assessment and interventions as needed.  Height: Ht Readings from Last 1 Encounters:  11/17/13 $RemoveB'5\' 8"'[id number]$  (1.727 m)    Weight: Wt Readings from Last 1 Encounters:  11/17/13 165 lb 9.1 oz (75.1 kg)    Ideal Body Weight: 70 kg  % Ideal Body Weight: 107%  Wt Readings from Last 10 Encounters:  11/17/13 165 lb 9.1 oz (75.1 kg)  11/03/13 177 lb (80.287 kg)  09/30/13 167 lb (75.751 kg)  09/11/13 179 lb (81.194 kg)  09/11/13 179 lb (81.194 kg)  09/01/13 183 lb (83.008 kg)    Usual Body Weight: 180 lbs  % Usual Body Weight: 8%  BMI:  Body mass index is 25.18 kg/(m^2).  Estimated Nutritional Needs: Kcal: 1875-2000 Protein: 70-80g Fluid: ~2.0 L/day or per MD  Skin: intact  Diet Order: Renal  EDUCATION NEEDS: -Education not appropriate at this time  No intake or output data in the 24 hours ending 11/18/13 1054  Last BM: PTA  Labs:   Recent Labs Lab 11/17/13 1557 11/18/13 0440  NA 144 143  K 4.1 3.7  CL 103 103  CO2 26 23  BUN 68* 65*  CREATININE 7.12* 6.88*  CALCIUM 13.2* 12.1*  PHOS  --  4.5  GLUCOSE 133* 121*    CBG (last 3)   Recent Labs  11/17/13 2047 11/18/13 0734  GLUCAP 118* 118*    Scheduled Meds: . amLODipine  10 mg Oral QHS  . aspirin EC  81 mg Oral Daily  . calcitonin  400 Units Intramuscular Q24H  . carvedilol  12.5 mg Oral BID WC  . darbepoetin (  ARANESP) injection - NON-DIALYSIS  200 mcg Subcutaneous Once  . furosemide  80 mg Oral BID  . heparin  5,000 Units Subcutaneous 3 times per day  . hydrALAZINE  75 mg Oral 3 times per day  . insulin aspart  0-9 Units Subcutaneous TID WC  . insulin glargine  20 Units Subcutaneous QHS  . isosorbide mononitrate  60 mg Oral Daily  . multivitamin  1 tablet Oral QHS  . sodium chloride  3 mL Intravenous Q12H    Continuous Infusions: . sodium chloride 125 mL/hr at 11/17/13 2255    Past Medical History  Diagnosis Date  . Anemia   . Diabetes mellitus without  complication   . Chronic kidney disease   . Pneumonia   . Hypertension   . GERD (gastroesophageal reflux disease)     Past Surgical History  Procedure Laterality Date  . Appendectomy    . Av fistula placement Left 09/11/2013    Procedure:  CREATION- RADIOCEPHALIC arteriovenous fistula;  Surgeon: Conrad Schoharie, MD;  Location: [facility];  Service: Vascular;  Laterality: Left;    Brynda Greathouse, MS RD LDN Clinical Inpatient Dietitian Pager: [phone] Weekend/After hours pager: [phone]

## 2013-11-18 NOTE — Progress Notes (Signed)
[facility] PROGRESS NOTE  Lucas Calderon [id number] DOB: [birth year] DOA: 11/17/2013 PCP: Maryella Shivers, MD   Brief narrative 73 year old male with history of CKD stage 4, diabetes mellitus, hypertension, GERD, anemia who presented with  mental status changes for last 1 week and worsened renal function. Patient found to have creatinine of 7.1 and E. man with a level of 13.2.   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 Appreciate vascular sx evaluation of fistula. He recommends that found AV fistula to mature and can be used for dialysis if needed. Scheduled for left arm fistulogram next week (5/7 by Dr. Bridgett Larsson). If unable to use the fistula prior to fistulogram we'll plan on temporary catheter for dialysis. Continue multivitamin.  Acute encephalopathy Collect in the setting of hypocalcemia and uremia Monitor with hydration and calcitonin. They were following closely.  hypercalcemia Monitor with calcitonin, fluids and Lasix. Calcium level improving this a.m.  Diabetes mellitus Continue home dose Lantus and sliding scale insulin  Anemia in CKD Given Aranesp on admission  Chr diastolic dysfunction Currently euvolemic. Continue aspirin, coreg and  statin  Code Status: Full code Family Communication: None at bedside  Disposition Plan: Home once improved   Consultants:  Renal  Vascular surgery  Procedures:  None  Antibiotics:  None  HPI/Subjective: Patient seen and examined this morning. Sleepy but arousable and quite confused  Objective: Filed Vitals:   11/18/13 1145  BP: 170/76  Pulse: 83  Temp: 98.5 F (36.9 C)  Resp: 16    Intake/Output Summary (Last 24 hours) at 11/18/13 1453 Last data filed at 11/18/13 1230  Gross per 24 hour  Intake    180 ml  Output      0 ml  Net    180 ml   Filed Weights   11/17/13 2047  Weight: 75.1 kg (165 lb 9.1 oz)    Exam:   General:  Elderly male in no  acute distress, sleepy  HEENT: No pallor, moist oral mucosa  Chest: Clear to auscultation bilaterally, no added sounds  CVS: Normal S1 and S2, no murmurs rub or gallop  Abdomen: Soft, nontender, nondistended, bowel sounds present  Extremities: Left upper extremity AV graft  CNS: AAO x1, confused and has flapping tremors  Data Reviewed: Basic Metabolic Panel:  Recent Labs Lab 11/17/13 1557 11/18/13 0440  NA 144 143  K 4.1 3.7  CL 103 103  CO2 26 23  GLUCOSE 133* 121*  BUN 68* 65*  CREATININE 7.12* 6.88*  CALCIUM 13.2* 12.1*  PHOS  --  4.5   Liver Function Tests:  Recent Labs Lab 11/17/13 1557 11/18/13 0440  AST 38* 35  ALT 26 24  ALKPHOS 54 50  BILITOT 0.4 0.4  PROT 7.7 7.3  ALBUMIN 3.5 3.2*   No results found for this basename: LIPASE, AMYLASE,  in the last 168 hours No results found for this basename: AMMONIA,  in the last 168 hours CBC:  Recent Labs Lab 11/17/13 1557 11/18/13 0440  WBC 10.1 10.0  HGB 8.6* 8.5*  HCT 25.2* 24.2*  MCV 84.3 83.4  PLT 189 191   Cardiac Enzymes: No results found for this basename: CKTOTAL, CKMB, CKMBINDEX, TROPONINI,  in the last 168 hours BNP (last 3 results) No results found for this basename: PROBNP,  in the last 8760 hours CBG:  Recent Labs Lab 11/17/13 2047 11/18/13 0734 11/18/13 1142  GLUCAP 118* 118* 103*    No results found for this or any previous  visit (from the past 240 hour(s)).   Studies: Ct Head Wo Contrast  11/17/2013   CLINICAL DATA:  confusion  EXAM: CT HEAD WITHOUT CONTRAST  TECHNIQUE: Contiguous axial images were obtained from the base of the skull through the vertex without intravenous contrast.  COMPARISON:  CT HEAD W/O CM dated 11/17/2013  FINDINGS: No acute intracranial abnormality. Specifically, no hemorrhage, hydrocephalus, mass lesion, acute infarction, or significant intracranial injury. No acute calvarial abnormality. Stable mild global atrophy. Mild areas low attenuation within the  subcortical, deep, and periventricular white matter regions consistent with small vessel white matter ischemic changes.  IMPRESSION: No acute intracranial abnormality.   Electronically Signed   By: Margaree Mackintosh M.D.   On: 11/17/2013 18:39   Dg Chest Port 1 View  11/17/2013   CLINICAL DATA:  weakness  EXAM: PORTABLE CHEST - 1 VIEW  COMPARISON:  None.  FINDINGS: There is mild elevation of the right hemidiaphragm. Cardiac silhouette is moderately enlarged. Aorta is tortuous with atherosclerotic calcifications. The lungs are clear. Mild degenerative changes within the shoulders.  IMPRESSION: No active disease.   Electronically Signed   By: Margaree Mackintosh M.D.   On: 11/17/2013 16:13    Scheduled Meds: . amLODipine  10 mg Oral QHS  . aspirin EC  81 mg Oral Daily  . calcitonin  400 Units Intramuscular Q24H  . carvedilol  12.5 mg Oral BID WC  . furosemide  80 mg Oral BID  . heparin  5,000 Units Subcutaneous 3 times per day  . hydrALAZINE  75 mg Oral 3 times per day  . insulin aspart  0-9 Units Subcutaneous TID WC  . insulin glargine  20 Units Subcutaneous QHS  . isosorbide mononitrate  60 mg Oral Daily  . multivitamin  1 tablet Oral QHS  . sodium chloride  3 mL Intravenous Q12H   Continuous Infusions: . sodium chloride 125 mL/hr at 11/17/13 2255      Time spent: 25 minutes    Lucas Calderon  [facility] Pager [phone]  If 7PM-7AM, please contact night-coverage at [email or web address], password [email or web address] 11/18/2013, 2:53 PM  LOS: 1 day

## 2013-11-18 NOTE — Progress Notes (Signed)
Utilization review complete 

## 2013-11-18 NOTE — Progress Notes (Signed)
[facility] ROUNDING NOTE   Subjective:   Interval History: appears weak but alert and appropriate  Objective:  Vital signs in last 24 hours:  Temp:  [97.6 F (36.4 C)-98.5 F (36.9 C)] 98.5 F (36.9 C) (05/02 0734) Pulse Rate:  [71-88] 78 (05/02 0734) Resp:  [15-18] 17 (05/02 0734) BP: (146-169)/(57-87) 168/57 mmHg (05/02 0734) SpO2:  [96 %-100 %] 97 % (05/02 0734) Weight:  [75.1 kg (165 lb 9.1 oz)] 75.1 kg (165 lb 9.1 oz) (05/01 2047)  Weight change:  Filed Weights   11/17/13 2047  Weight: 75.1 kg (165 lb 9.1 oz)    Intake/Output:     Intake/Output this shift:     CVS- RRR RS- CTA ABD- BS present soft non-distended EXT- no edema AVF Left not mature  Basic Metabolic Panel:  Recent Labs Lab 11/17/13 1557 11/18/13 0440  NA 144 143  K 4.1 3.7  CL 103 103  CO2 26 23  GLUCOSE 133* 121*  BUN 68* 65*  CREATININE 7.12* 6.88*  CALCIUM 13.2* 12.1*  PHOS  --  4.5    Liver Function Tests:  Recent Labs Lab 11/17/13 1557 11/18/13 0440  AST 38* 35  ALT 26 24  ALKPHOS 54 50  BILITOT 0.4 0.4  PROT 7.7 7.3  ALBUMIN 3.5 3.2*   No results found for this basename: LIPASE, AMYLASE,  in the last 168 hours No results found for this basename: AMMONIA,  in the last 168 hours  CBC:  Recent Labs Lab 11/17/13 1557 11/18/13 0440  WBC 10.1 10.0  HGB 8.6* 8.5*  HCT 25.2* 24.2*  MCV 84.3 83.4  PLT 189 191    Cardiac Enzymes: No results found for this basename: CKTOTAL, CKMB, CKMBINDEX, TROPONINI,  in the last 168 hours  BNP: No components found with this basename: POCBNP,   CBG:  Recent Labs Lab 11/17/13 2047 11/18/13 0734  GLUCAP 118* 118*    Microbiology: Results for orders placed during the hospital encounter of 09/25/13  MRSA PCR SCREENING     Status: None   Collection Time    09/25/13 11:37 PM      Result Value Ref Range Status   MRSA by PCR NEGATIVE  NEGATIVE Final   Comment:            The GeneXpert MRSA Assay (FDA   approved for NASAL specimens     only), is one component of a     comprehensive MRSA colonization     surveillance program. It is not     intended to diagnose MRSA     infection nor to guide or     monitor treatment for     MRSA infections.  URINE CULTURE     Status: None   Collection Time    09/26/13  1:45 AM      Result Value Ref Range Status   Specimen Description URINE, RANDOM   Final   Special Requests NONE   Final   Culture  Setup Time     Final   Value: 09/26/2013 08:59     Performed at [place] Count     Final   Value: 20,OOO COLONIES/ML     Performed at [facility]   Culture     Final   Value: VANCOMYCIN RESISTANT ENTEROCOCCUS ISOLATED     Performed at [facility]   Report Status 09/30/2013 FINAL   Final   Organism ID, Bacteria VANCOMYCIN RESISTANT ENTEROCOCCUS ISOLATED  Final  CULTURE, BLOOD (ROUTINE X 2)     Status: None   Collection Time    09/26/13  2:25 AM      Result Value Ref Range Status   Specimen Description BLOOD RIGHT HAND   Final   Special Requests BOTTLES DRAWN AEROBIC ONLY 5CC   Final   Culture  Setup Time     Final   Value: 09/26/2013 08:51     Performed at [facility]   Culture     Final   Value: NO GROWTH 5 DAYS     Performed at [facility]   Report Status 10/02/2013 FINAL   Final  CULTURE, BLOOD (ROUTINE X 2)     Status: None   Collection Time    09/26/13  2:31 AM      Result Value Ref Range Status   Specimen Description BLOOD RIGHT FOREARM   Final   Special Requests BOTTLES DRAWN AEROBIC ONLY 5CC   Final   Culture  Setup Time     Final   Value: 09/26/2013 08:51     Performed at [facility]   Culture     Final   Value: NO GROWTH 5 DAYS     Performed at [facility]   Report Status 10/02/2013 FINAL   Final  CULTURE, BLOOD (ROUTINE X 2)     Status: None   Collection Time    09/28/13 12:15 PM      Result Value Ref Range Status   Specimen Description BLOOD ARM  RIGHT   Final   Special Requests BOTTLES DRAWN AEROBIC ONLY [facility]   Final   Culture  Setup Time     Final   Value: 09/28/2013 16:07     Performed at [facility]   Culture     Final   Value: NO GROWTH 5 DAYS     Performed at [facility]   Report Status 10/04/2013 FINAL   Final  CULTURE, BLOOD (ROUTINE X 2)     Status: None   Collection Time    09/28/13 12:20 PM      Result Value Ref Range Status   Specimen Description BLOOD RIGHT HAND   Final   Special Requests BOTTLES DRAWN AEROBIC ONLY 10CC   Final   Culture  Setup Time     Final   Value: 09/28/2013 16:07     Performed at [facility]   Culture     Final   Value: NO GROWTH 5 DAYS     Performed at [facility]   Report Status 10/04/2013 FINAL   Final    Coagulation Studies: No results found for this basename: LABPROT, INR,  in the last 72 hours  Urinalysis:  Recent Labs  11/17/13 1642  COLORURINE YELLOW  LABSPEC 1.014  PHURINE 5.5  [place] 100*  UROBILINOGEN 0.2  NITRITE NEGATIVE  LEUKOCYTESUR NEGATIVE      Imaging: Ct Head Wo Contrast  11/17/2013   CLINICAL DATA:  confusion  EXAM: CT HEAD WITHOUT CONTRAST  TECHNIQUE: Contiguous axial images were obtained from the base of the skull through the vertex without intravenous contrast.  COMPARISON:  CT HEAD W/O CM dated 11/17/2013  FINDINGS: No acute intracranial abnormality. Specifically, no hemorrhage, hydrocephalus, mass lesion, acute infarction, or significant intracranial injury. No acute calvarial abnormality. Stable mild global atrophy. Mild areas low attenuation within the subcortical, deep, and periventricular  white matter regions consistent with small vessel white matter ischemic changes.  IMPRESSION: No acute intracranial abnormality.   Electronically Signed   By: Margaree Mackintosh M.D.   On: 11/17/2013 18:39   Dg Chest Port 1 View  11/17/2013   CLINICAL DATA:   weakness  EXAM: PORTABLE CHEST - 1 VIEW  COMPARISON:  None.  FINDINGS: There is mild elevation of the right hemidiaphragm. Cardiac silhouette is moderately enlarged. Aorta is tortuous with atherosclerotic calcifications. The lungs are clear. Mild degenerative changes within the shoulders.  IMPRESSION: No active disease.   Electronically Signed   By: Margaree Mackintosh M.D.   On: 11/17/2013 16:13     Medications:   . sodium chloride 125 mL/hr at 11/17/13 2255   . amLODipine  10 mg Oral QHS  . aspirin EC  81 mg Oral Daily  . calcitonin  400 Units Intramuscular Q24H  . carvedilol  12.5 mg Oral BID WC  . darbepoetin (ARANESP) injection - NON-DIALYSIS  200 mcg Subcutaneous Once  . furosemide  80 mg Oral BID  . heparin  5,000 Units Subcutaneous 3 times per day  . hydrALAZINE  75 mg Oral 3 times per day  . insulin aspart  0-9 Units Subcutaneous TID WC  . insulin glargine  20 Units Subcutaneous QHS  . isosorbide mononitrate  60 mg Oral Daily  . multivitamin  1 tablet Oral QHS  . sodium chloride  3 mL Intravenous Q12H   albuterol  Assessment/ Plan:  HPI: 73 yr old male with CKD4/5 , Cr 4.77 in 3/15, who presents now with worsening of MS. Apparently over past week has been confused (family not present). Hx of DM but he does not know bs. Does not know meds or if taking. No SOB, cough, N,V, D, cramps, itching according to him. Does not know why he is here. Found to have Cr 7.12 and Ca 13.2. Has been on Phoslo and Calcitriol.   Accute on CKD stage 5  Not uremic and agree with Dr Deterding assessment  Hypercalcemia  Secondary to Vitamin D toxicitiy  Should improve with hydration and calcitonin  Immature AVF   D/W Dr Deterding - Dr Oneida Alar aware   LOS: [place] @TODAY @11 :02 AM

## 2013-11-18 NOTE — Progress Notes (Signed)
Asked by Dr Deterding to see pt regarding non maturing AVF.  Fistula placed by Dr Bridgett Larsson 2/15.  Pt currently not on dialysis but approaching that.  Was admitted with altered mental status.  PE:  Left upper extremity: + thrill in fistula, 6-8 mm diameter when compressed, possble side branch proximal aspect  A: Mature left arm AVF, could be used if needs dialysis P: Will schedule for left arm fistulogram by Dr Bridgett Larsson 5/7 to further evaluate side branches.  This will give pt time to improve mental status.  If unable to use fistula prior to needing it he will need catheter temporarily.  Ruta Hinds, MD [facility] Office: [phone] Pager: [phone]

## 2013-11-19 DIAGNOSIS — I129 Hypertensive chronic kidney disease with stage 1 through stage 4 chronic kidney disease, or unspecified chronic kidney disease: Secondary | ICD-10-CM | POA: Diagnosis not present

## 2013-11-19 DIAGNOSIS — E162 Hypoglycemia, unspecified: Secondary | ICD-10-CM | POA: Diagnosis not present

## 2013-11-19 DIAGNOSIS — N185 Chronic kidney disease, stage 5: Secondary | ICD-10-CM | POA: Diagnosis not present

## 2013-11-19 DIAGNOSIS — D631 Anemia in chronic kidney disease: Secondary | ICD-10-CM | POA: Diagnosis not present

## 2013-11-19 DIAGNOSIS — R4182 Altered mental status, unspecified: Secondary | ICD-10-CM | POA: Diagnosis not present

## 2013-11-19 DIAGNOSIS — N2581 Secondary hyperparathyroidism of renal origin: Secondary | ICD-10-CM | POA: Diagnosis not present

## 2013-11-19 LAB — BASIC METABOLIC PANEL
BUN: 62 mg/dL — ABNORMAL HIGH (ref 6–23)
CALCIUM: 10.9 mg/dL — AB (ref 8.4–10.5)
CO2: 24 mEq/L (ref 19–32)
Chloride: 110 mEq/L (ref 96–112)
Creatinine, Ser: 6.5 mg/dL — ABNORMAL HIGH (ref 0.50–1.35)
GFR, EST AFRICAN AMERICAN: 9 mL/min — AB (ref >90)
GFR, EST NON AFRICAN AMERICAN: 8 mL/min — AB (ref >90)
GLUCOSE: 40 mg/dL — AB (ref 70–99)
POTASSIUM: 3.3 meq/L — AB (ref 3.7–5.3)
Sodium: 146 mEq/L (ref 137–147)

## 2013-11-19 LAB — GLUCOSE, CAPILLARY
GLUCOSE-CAPILLARY: 112 mg/dL — AB (ref 70–99)
Glucose-Capillary: 37 mg/dL — CL (ref 70–99)
Glucose-Capillary: 66 mg/dL — ABNORMAL LOW (ref 70–99)
Glucose-Capillary: 94 mg/dL (ref 70–99)
Glucose-Capillary: 98 mg/dL (ref 70–99)

## 2013-11-19 MED ORDER — DEXTROSE-NACL 5-0.9 % IV SOLN
INTRAVENOUS | Status: DC
Start: 2013-11-19 — End: 2013-11-20
  Administered 2013-11-19: 15:00:00 via INTRAVENOUS
  Filled 2013-11-19 (×3): qty 1000

## 2013-11-19 MED ORDER — INSULIN ASPART 100 UNIT/ML ~~LOC~~ SOLN
0.0000 [IU] | SUBCUTANEOUS | Status: DC
  Administered 2013-11-20 (×2): 1 [IU] via SUBCUTANEOUS

## 2013-11-19 MED ORDER — DARBEPOETIN ALFA-POLYSORBATE 100 MCG/0.5ML IJ SOLN: 100.0000 ug | Freq: Once | INTRAMUSCULAR | Status: DC

## 2013-11-19 MED ORDER — POTASSIUM CHLORIDE CRYS ER 20 MEQ PO TBCR
40.0000 meq | EXTENDED_RELEASE_TABLET | Freq: Once | ORAL | Status: AC
  Administered 2013-11-19: 40 meq via ORAL
  Filled 2013-11-19: qty 2

## 2013-11-19 MED ORDER — WHITE PETROLATUM GEL
Status: AC
  Administered 2013-11-19: 0.2
  Filled 2013-11-19: qty 5

## 2013-11-19 NOTE — Progress Notes (Signed)
Patient's 8am CBG was 80.  CBG machine unable to transfer blood glucose values to computer.  Will continue to monitor.

## 2013-11-19 NOTE — Progress Notes (Addendum)
[facility] PROGRESS NOTE  Lucas Calderon [id number] DOB: 07-Apr-1941 DOA: 11/17/2013 PCP: Lucas Shivers, MD   Brief narrative 73 year old male with history of CKD stage 4, diabetes mellitus, hypertension, GERD, anemia who presented with  mental status changes for last 1 week and worsened renal function. Patient found to have creatinine of 7.1 and  Hypocalcemia with  level of 13.2.   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 Appreciate vascular sx evaluation of fistula. He recommends that found AV fistula to mature and can be used for dialysis if needed. Scheduled for left arm fistulogram next week (5/7 by Dr. Bridgett Calderon). If unable to use the fistula prior to fistulogram,  plan on temporary catheter for dialysis. Continue multivitamin.  Acute encephalopathy As per sister, Lucas Calderon, he has been having frequent falls , confused and with garbled speech for 2 weeks. Possibly  in the setting of hypocalcemia and uremia although the numbers are improving. As per recent hospitalization 6 weeks back mental status was normal.  Monitor with hydration and calcitonin. Had episodes of hypoglycemia today. Hold lantus and monitor on SSI. patient has poor po intake.  check UDS, B12, RPR, HIV and TSH level. -avoid narcotics or benzos.  hypercalcemia Monitor with calcitonin, fluids and Lasix. Calcium level improving this a.m.   Diabetes mellitus with hypoglycemic episdes Hold lantus given low fsg.  monitor fsg q4 hr with SSI. Will place on d5 NS given recurrent low fsg with poor po intake.  HTN  continue coreg, hydralazine, imdur  and lasix bid.  Anemia in CKD Getting  Aranesp per renal  Chr diastolic dysfunction Currently euvolemic. Continue aspirin, coreg and  statin  Code Status: Full code Family Communication: spoke with sister Lucas Calderon over the phone Disposition Plan: Home once improved   Consultants:  Renal  Vascular  surgery  Procedures:  None  Antibiotics:  None  HPI/Subjective: Patient seen and examined this morning. Still  quite confused  Objective: Filed Vitals:   11/19/13 1115  BP: 149/73  Pulse: 78  Temp: 98.1 F (36.7 C)  Resp: 18    Intake/Output Summary (Last 24 hours) at 11/19/13 1429 Last data filed at 11/19/13 1109  Gross per 24 hour  Intake 3750.42 ml  Output    550 ml  Net 3200.42 ml   Filed Weights   11/17/13 2047 11/18/13 2100  Weight: 75.1 kg (165 lb 9.1 oz) 74.9 kg (165 lb 2 oz)    Exam:   General:  Elderly male in no acute distress, sleepy  HEENT: No pallor, moist oral mucosa  Chest: Clear to auscultation bilaterally, no added sounds  CVS: Normal S1 and S2, no murmurs rub or gallop  Abdomen: Soft, nontender, nondistended, bowel sounds present  Extremities: Left upper extremity AV graft  CNS: AAO x1-2 , confused, has garbled speech and has flapping tremors  Data Reviewed: Basic Metabolic Panel:  Recent Labs Lab 11/17/13 1557 11/18/13 0440 11/19/13 0338  NA 144 143 146  K 4.1 3.7 3.3*  CL 103 103 110  CO2 26 23 24   GLUCOSE 133* 121* 40*  BUN 68* 65* 62*  CREATININE 7.12* 6.88* 6.50*  CALCIUM 13.2* 12.1* 10.9*  PHOS  --  4.5  --    Liver Function Tests:  Recent Labs Lab 11/17/13 1557 11/18/13 0440  AST 38* 35  ALT 26 24  ALKPHOS 54 50  BILITOT 0.4 0.4  PROT 7.7 7.3  ALBUMIN 3.5 3.2*   No results found for this basename: LIPASE,  AMYLASE,  in the last 168 hours No results found for this basename: AMMONIA,  in the last 168 hours CBC:  Recent Labs Lab 11/17/13 1557 11/18/13 0440  WBC 10.1 10.0  HGB 8.6* 8.5*  HCT 25.2* 24.2*  MCV 84.3 83.4  PLT 189 191   Cardiac Enzymes: No results found for this basename: CKTOTAL, CKMB, CKMBINDEX, TROPONINI,  in the last 168 hours BNP (last 3 results) No results found for this basename: PROBNP,  in the last 8760 hours CBG:  Recent Labs Lab 11/18/13 1650 11/18/13 2129  11/19/13 0518 11/19/13 1119 11/19/13 1240  GLUCAP 109* 113* 37* 66* 94    No results found for this or any previous visit (from the past 240 hour(s)).   Studies: Ct Head Wo Contrast  11/17/2013   CLINICAL DATA:  confusion  EXAM: CT HEAD WITHOUT CONTRAST  TECHNIQUE: Contiguous axial images were obtained from the base of the skull through the vertex without intravenous contrast.  COMPARISON:  CT HEAD W/O CM dated 11/17/2013  FINDINGS: No acute intracranial abnormality. Specifically, no hemorrhage, hydrocephalus, mass lesion, acute infarction, or significant intracranial injury. No acute calvarial abnormality. Stable mild global atrophy. Mild areas low attenuation within the subcortical, deep, and periventricular white matter regions consistent with small vessel white matter ischemic changes.  IMPRESSION: No acute intracranial abnormality.   Electronically Signed   By: Margaree Mackintosh M.D.   On: 11/17/2013 18:39   Dg Chest Port 1 View  11/17/2013   CLINICAL DATA:  weakness  EXAM: PORTABLE CHEST - 1 VIEW  COMPARISON:  None.  FINDINGS: There is mild elevation of the right hemidiaphragm. Cardiac silhouette is moderately enlarged. Aorta is tortuous with atherosclerotic calcifications. The lungs are clear. Mild degenerative changes within the shoulders.  IMPRESSION: No active disease.   Electronically Signed   By: Margaree Mackintosh M.D.   On: 11/17/2013 16:13    Scheduled Meds: . amLODipine  10 mg Oral QHS  . aspirin EC  81 mg Oral Daily  . calcitonin  400 Units Intramuscular Q24H  . carvedilol  12.5 mg Oral BID WC  . furosemide  80 mg Oral BID  . heparin  5,000 Units Subcutaneous 3 times per day  . hydrALAZINE  75 mg Oral 3 times per day  . insulin aspart  0-9 Units Subcutaneous TID WC  . insulin glargine  20 Units Subcutaneous QHS  . isosorbide mononitrate  60 mg Oral Daily  . multivitamin  1 tablet Oral QHS  . sodium chloride  3 mL Intravenous Q12H   Continuous Infusions: . sodium chloride 125  mL/hr at 11/19/13 0530      Time spent: 25 minutes    Lucas Calderon  [facility] Pager [phone]  If 7PM-7AM, please contact night-coverage at [email or web address], password [facility] 11/19/2013, 2:29 PM  LOS: 2 days

## 2013-11-19 NOTE — Progress Notes (Signed)
Hypoglycemic Event  CBG: 66  Treatment: 15 GM carbohydrate snack (120 mL juice)  Symptoms: None  Follow-up CBG: Time:12:30  CBG Result: 94  Possible Reasons for Event: Inadequate meal intake  Comments/MD notified: Dr. Barbette Hair  Remember to initiate Hypoglycemia Order Set & complete

## 2013-11-19 NOTE — Progress Notes (Signed)
[facility] ROUNDING NOTE   Subjective:   Interval History: curled up in bed with sheet wrapped around him. Seems somewhat suspicious and guarded this morning but denies any systemic complaints  Objective:  Vital signs in last 24 hours:  Temp:  [97.1 F (36.2 C)-98.7 F (37.1 C)] 97.1 F (36.2 C) (05/03 0838) Pulse Rate:  [66-86] 82 (05/03 0838) Resp:  [16] 16 (05/03 0838) BP: (125-170)/(54-76) 147/64 mmHg (05/03 0838) SpO2:  [97 %-100 %] 100 % (05/03 0838) Weight:  [74.9 kg (165 lb 2 oz)] 74.9 kg (165 lb 2 oz) (05/02 2100)  Weight change: -0.2 kg (-7.1 oz) Filed Weights   11/17/13 2047 11/18/13 2100  Weight: 75.1 kg (165 lb 9.1 oz) 74.9 kg (165 lb 2 oz)    Intake/Output: I/O last 3 completed shifts: In: 3690.4 [P.O.:180; I.V.:3510.4] Out: -    Intake/Output this shift:  Total I/O In: 240 [P.O.:240] Out: 300 [Urine:300]  CVS- RRR  RS- CTA  ABD- BS present soft non-distended  EXT- no edema  AVF Left not mature    Basic Metabolic Panel:  Recent Labs Lab 11/17/13 1557 11/18/13 0440 11/19/13 0338  NA 144 143 146  K 4.1 3.7 3.3*  CL 103 103 110  CO2 26 23 24   GLUCOSE 133* 121* 40*  BUN 68* 65* 62*  CREATININE 7.12* 6.88* 6.50*  CALCIUM 13.2* 12.1* 10.9*  PHOS  --  4.5  --     Liver Function Tests:  Recent Labs Lab 11/17/13 1557 11/18/13 0440  AST 38* 35  ALT 26 24  ALKPHOS 54 50  BILITOT 0.4 0.4  PROT 7.7 7.3  ALBUMIN 3.5 3.2*   No results found for this basename: LIPASE, AMYLASE,  in the last 168 hours No results found for this basename: AMMONIA,  in the last 168 hours  CBC:  Recent Labs Lab 11/17/13 1557 11/18/13 0440  WBC 10.1 10.0  HGB 8.6* 8.5*  HCT 25.2* 24.2*  MCV 84.3 83.4  PLT 189 191    Cardiac Enzymes: No results found for this basename: CKTOTAL, CKMB, CKMBINDEX, TROPONINI,  in the last 168 hours  BNP: No components found with this basename: POCBNP,   CBG:  Recent Labs Lab 11/18/13 0734  11/18/13 1142 11/18/13 1650 11/18/13 2129 11/19/13 0518  GLUCAP 118* 103* 109* 113* 38*    Microbiology: Results for orders placed during the hospital encounter of 09/25/13  MRSA PCR SCREENING     Status: None   Collection Time    09/25/13 11:37 PM      Result Value Ref Range Status   MRSA by PCR NEGATIVE  NEGATIVE Final   Comment:            The GeneXpert MRSA Assay (FDA     approved for NASAL specimens     only), is one component of a     comprehensive MRSA colonization     surveillance program. It is not     intended to diagnose MRSA     infection nor to guide or     monitor treatment for     MRSA infections.  URINE CULTURE     Status: None   Collection Time    09/26/13  1:45 AM      Result Value Ref Range Status   Specimen Description URINE, RANDOM   Final   Special Requests NONE   Final   Culture  Setup Time     Final   Value: 09/26/2013 08:59  Performed at [facility]     Final   Value: 20,OOO COLONIES/ML     Performed at [facility]   Culture     Final   Value: VANCOMYCIN RESISTANT ENTEROCOCCUS ISOLATED     Performed at [facility]   Report Status 09/30/2013 FINAL   Final   Organism ID, Bacteria VANCOMYCIN RESISTANT ENTEROCOCCUS ISOLATED   Final  CULTURE, BLOOD (ROUTINE X 2)     Status: None   Collection Time    09/26/13  2:25 AM      Result Value Ref Range Status   Specimen Description BLOOD RIGHT HAND   Final   Special Requests BOTTLES DRAWN AEROBIC ONLY 5CC   Final   Culture  Setup Time     Final   Value: 09/26/2013 08:51     Performed at [facility]   Culture     Final   Value: NO GROWTH 5 DAYS     Performed at [facility]   Report Status 10/02/2013 FINAL   Final  CULTURE, BLOOD (ROUTINE X 2)     Status: None   Collection Time    09/26/13  2:31 AM      Result Value Ref Range Status   Specimen Description BLOOD RIGHT FOREARM   Final   Special Requests BOTTLES DRAWN AEROBIC ONLY 5CC    Final   Culture  Setup Time     Final   Value: 09/26/2013 08:51     Performed at [facility]   Culture     Final   Value: NO GROWTH 5 DAYS     Performed at [facility]   Report Status 10/02/2013 FINAL   Final  CULTURE, BLOOD (ROUTINE X 2)     Status: None   Collection Time    09/28/13 12:15 PM      Result Value Ref Range Status   Specimen Description BLOOD ARM RIGHT   Final   Special Requests BOTTLES DRAWN AEROBIC ONLY [facility]   Final   Culture  Setup Time     Final   Value: 09/28/2013 16:07     Performed at [facility]   Culture     Final   Value: NO GROWTH 5 DAYS     Performed at [facility]   Report Status 10/04/2013 FINAL   Final  CULTURE, BLOOD (ROUTINE X 2)     Status: None   Collection Time    09/28/13 12:20 PM      Result Value Ref Range Status   Specimen Description BLOOD RIGHT HAND   Final   Special Requests BOTTLES DRAWN AEROBIC ONLY 10CC   Final   Culture  Setup Time     Final   Value: 09/28/2013 16:07     Performed at [facility]   Culture     Final   Value: NO GROWTH 5 DAYS     Performed at [facility]   Report Status 10/04/2013 FINAL   Final    Coagulation Studies: No results found for this basename: LABPROT, INR,  in the last 72 hours  Urinalysis:  Recent Labs  11/17/13 1642  COLORURINE YELLOW  LABSPEC 1.014  PHURINE 5.5  GLUCOSEU NEGATIVE  HGBUR TRACE*  BILIRUBINUR NEGATIVE  KETONESUR NEGATIVE  PROTEINUR 100*  UROBILINOGEN 0.2  NITRITE NEGATIVE  LEUKOCYTESUR NEGATIVE      Imaging: Ct Head Wo Contrast  11/17/2013   CLINICAL DATA:  confusion  EXAM: CT HEAD WITHOUT CONTRAST  TECHNIQUE: Contiguous axial images were obtained from the base of the skull through the vertex without intravenous contrast.  COMPARISON:  CT HEAD W/O CM dated 11/17/2013  FINDINGS: No acute intracranial abnormality. Specifically, no hemorrhage, hydrocephalus, mass lesion, acute infarction, or significant intracranial injury.  No acute calvarial abnormality. Stable mild global atrophy. Mild areas low attenuation within the subcortical, deep, and periventricular white matter regions consistent with small vessel white matter ischemic changes.  IMPRESSION: No acute intracranial abnormality.   Electronically Signed   By: Margaree Mackintosh M.D.   On: 11/17/2013 18:39   Dg Chest Port 1 View  11/17/2013   CLINICAL DATA:  weakness  EXAM: PORTABLE CHEST - 1 VIEW  COMPARISON:  None.  FINDINGS: There is mild elevation of the right hemidiaphragm. Cardiac silhouette is moderately enlarged. Aorta is tortuous with atherosclerotic calcifications. The lungs are clear. Mild degenerative changes within the shoulders.  IMPRESSION: No active disease.   Electronically Signed   By: Margaree Mackintosh M.D.   On: 11/17/2013 16:13     Medications:   . sodium chloride 125 mL/hr at 11/19/13 0530   . amLODipine  10 mg Oral QHS  . aspirin EC  81 mg Oral Daily  . calcitonin  400 Units Intramuscular Q24H  . carvedilol  12.5 mg Oral BID WC  . furosemide  80 mg Oral BID  . heparin  5,000 Units Subcutaneous 3 times per day  . hydrALAZINE  75 mg Oral 3 times per day  . insulin aspart  0-9 Units Subcutaneous TID WC  . insulin glargine  20 Units Subcutaneous QHS  . isosorbide mononitrate  60 mg Oral Daily  . multivitamin  1 tablet Oral QHS  . sodium chloride  3 mL Intravenous Q12H   albuterol  Assessment/ Plan:  HPI: 73 yr old male with CKD4/5 , Cr 4.77 in 3/15, who presents now with worsening of MS. Apparently over past week has been confused (family not present). Hx of DM but he does not know bs. Does not know meds or if taking. No SOB, cough, N,V, D, cramps, itching according to him. Does not know why he is here. Found to have Cr 7.12 and Ca 13.2. Has been on Phoslo and Calcitriol.   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

## 2013-11-20 DIAGNOSIS — D631 Anemia in chronic kidney disease: Secondary | ICD-10-CM | POA: Diagnosis not present

## 2013-11-20 DIAGNOSIS — N179 Acute kidney failure, unspecified: Principal | ICD-10-CM

## 2013-11-20 DIAGNOSIS — E1169 Type 2 diabetes mellitus with other specified complication: Secondary | ICD-10-CM | POA: Diagnosis not present

## 2013-11-20 DIAGNOSIS — N2581 Secondary hyperparathyroidism of renal origin: Secondary | ICD-10-CM | POA: Diagnosis not present

## 2013-11-20 DIAGNOSIS — I129 Hypertensive chronic kidney disease with stage 1 through stage 4 chronic kidney disease, or unspecified chronic kidney disease: Secondary | ICD-10-CM | POA: Diagnosis not present

## 2013-11-20 DIAGNOSIS — N184 Chronic kidney disease, stage 4 (severe): Secondary | ICD-10-CM | POA: Diagnosis not present

## 2013-11-20 LAB — IRON AND TIBC
Iron: 66 ug/dL (ref 42–135)
SATURATION RATIOS: 32 % (ref 20–55)
TIBC: 208 ug/dL — AB (ref 215–435)
UIBC: 142 ug/dL (ref 125–400)

## 2013-11-20 LAB — BASIC METABOLIC PANEL
BUN: 52 mg/dL — ABNORMAL HIGH (ref 6–23)
CO2: 21 mEq/L (ref 19–32)
Calcium: 9.7 mg/dL (ref 8.4–10.5)
Chloride: 110 mEq/L (ref 96–112)
Creatinine, Ser: 6.15 mg/dL — ABNORMAL HIGH (ref 0.50–1.35)
GFR calc Af Amer: 10 mL/min — ABNORMAL LOW (ref >90)
GFR calc non Af Amer: 8 mL/min — ABNORMAL LOW (ref >90)
GLUCOSE: 145 mg/dL — AB (ref 70–99)
POTASSIUM: 3.6 meq/L — AB (ref 3.7–5.3)
SODIUM: 144 meq/L (ref 137–147)

## 2013-11-20 LAB — VITAMIN B12: Vitamin B-12: 596 pg/mL (ref 211–911)

## 2013-11-20 LAB — GLUCOSE, CAPILLARY
GLUCOSE-CAPILLARY: 126 mg/dL — AB (ref 70–99)
GLUCOSE-CAPILLARY: 130 mg/dL — AB (ref 70–99)
Glucose-Capillary: 168 mg/dL — ABNORMAL HIGH (ref 70–99)

## 2013-11-20 LAB — HIV ANTIBODY (ROUTINE TESTING W REFLEX): HIV: NONREACTIVE

## 2013-11-20 LAB — PARATHYROID HORMONE, INTACT (NO CA): PTH: 7.5 pg/mL — ABNORMAL LOW (ref 14.0–72.0)

## 2013-11-20 LAB — TSH: TSH: 0.862 u[IU]/mL (ref 0.350–4.500)

## 2013-11-20 LAB — RPR

## 2013-11-20 MED ORDER — DEXTROSE-NACL 5-0.9 % IV SOLN
INTRAVENOUS | Status: DC
  Administered 2013-11-20 (×2): via INTRAVENOUS

## 2013-11-20 NOTE — Progress Notes (Signed)
[facility] PROGRESS NOTE  Lucas Calderon [id number] DOB: 12-24-1940 DOA: 11/17/2013 PCP: Maryella Shivers, MD   Brief narrative 73 year old male with history of CKD stage 4, diabetes mellitus, hypertension, GERD, anemia who presented with  mental status changes for last 1 week and worsened renal function. Patient found to have creatinine of 7.1 and  Hypocalcemia with  level of 13.2.   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 Appreciate vascular sx evaluation of fistula. He recommends that found AV fistula to mature and can be used for dialysis if needed. Scheduled for left arm fistulogram next week (5/7 by Dr. Bridgett Larsson). If unable to use the fistula prior to fistulogram,  plan on temporary catheter for dialysis. Continue multivitamin.  Acute encephalopathy As per sister, [name], he has been having frequent falls , confused and with garbled speech for 2 weeks. Possibly  in the setting of hypocalcemia and uremia although the numbers are improving. As per recent hospitalization 6 weeks back mental status was normal.  Monitor with hydration and calcitonin. Had episodes of hypoglycemia today. Hold lantus and monitor on SSI. patient has poor po intake. - TSH: 0.862, RPR: Nonreactive. B12: 596. UDS pending. CT head without acute findings - avoid narcotics or benzos. - Check with family to see if mental status continues to improve  hypercalcemia Secondary to vitamin D toxicity. Improving with calcitonin and hydration. Calcium improved from 13.2 on 5/1 > 9.7 on 5/4.   Diabetes mellitus with hypoglycemic episdes Hold lantus given low fsg.  monitor fsg q4 hr with SSI. Will place on d5 NS given recurrent low fsg with poor po intake. No further hypoglycemic episodes since 5/3.  HTN  continue coreg, hydralazine, imdur  and lasix bid.  Anemia in CKD Getting  Aranesp per renal. Stable  Chr diastolic dysfunction Currently euvolemic.  Continue aspirin, coreg and  statin  Code Status: Full code Family Communication: None at bedside. Disposition Plan: Home once improved   Consultants:  Renal  Vascular surgery  Procedures:  None  Antibiotics:  None  HPI/Subjective: Pleasantly confused. Denies complaints. As per nursing staff, attempts to get out of bed.  Objective: Filed Vitals:   11/20/13 0828  BP: 154/72  Pulse: 87  Temp: 98.5 F (36.9 C)  Resp: 18    Intake/Output Summary (Last 24 hours) at 11/20/13 1604 Last data filed at 11/20/13 0840  Gross per 24 hour  Intake    790 ml  Output   1200 ml  Net   -410 ml   Filed Weights   11/17/13 2047 11/18/13 2100 11/19/13 2005  Weight: 75.1 kg (165 lb 9.1 oz) 74.9 kg (165 lb 2 oz) 74.8 kg (164 lb 14.5 oz)    Exam:   General:  Elderly male in no acute distress, sleepy  Chest: Clear to auscultation bilaterally, no added sounds  CVS: Normal S1 and S2, no murmurs rub or gallop  Abdomen: Soft, nontender, nondistended, bowel sounds present  Extremities: Left upper extremity AV graft  CNS: Alert and oriented to self, partly to place but not to time. No focal deficits. No obvious asterixis.  Data Reviewed: Basic Metabolic Panel:  Recent Labs Lab 11/17/13 1557 11/18/13 0440 11/19/13 0338 11/20/13 0635  NA 144 143 146 144  K 4.1 3.7 3.3* 3.6*  CL 103 103 110 110  CO2 26 23 24 21   GLUCOSE 133* 121* 40* 145*  BUN 68* 65* 62* 52*  CREATININE 7.12* 6.88* 6.50* 6.15*  CALCIUM 13.2* 12.1* 10.9*  9.7  PHOS  --  4.5  --   --    Liver Function Tests:  Recent Labs Lab 11/17/13 1557 11/18/13 0440  AST 38* 35  ALT 26 24  ALKPHOS 54 50  BILITOT 0.4 0.4  PROT 7.7 7.3  ALBUMIN 3.5 3.2*   No results found for this basename: LIPASE, AMYLASE,  in the last 168 hours No results found for this basename: AMMONIA,  in the last 168 hours CBC:  Recent Labs Lab 11/17/13 1557 11/18/13 0440  WBC 10.1 10.0  HGB 8.6* 8.5*  HCT 25.2* 24.2*  MCV  84.3 83.4  PLT 189 191   Cardiac Enzymes: No results found for this basename: CKTOTAL, CKMB, CKMBINDEX, TROPONINI,  in the last 168 hours BNP (last 3 results) No results found for this basename: PROBNP,  in the last 8760 hours CBG:  Recent Labs Lab 11/19/13 1240 11/19/13 1654 11/19/13 2003 11/20/13 0022 11/20/13 1227  GLUCAP 94 98 112* 126* 130*    No results found for this or any previous visit (from the past 240 hour(s)).   Studies: No results found.  Scheduled Meds: . amLODipine  10 mg Oral QHS  . aspirin EC  81 mg Oral Daily  . calcitonin  400 Units Intramuscular Q24H  . carvedilol  12.5 mg Oral BID WC  . furosemide  80 mg Oral BID  . heparin  5,000 Units Subcutaneous 3 times per day  . hydrALAZINE  75 mg Oral 3 times per day  . insulin aspart  0-9 Units Subcutaneous 6 times per day  . isosorbide mononitrate  60 mg Oral Daily  . multivitamin  1 tablet Oral QHS  . sodium chloride  3 mL Intravenous Q12H   Continuous Infusions: . dextrose 5 % and 0.9% NaCl 100 mL/hr at 11/20/13 0030      Time spent: 25 minutes    Modena Jansky, MD, FACP, [facility]. [facility] Pager [phone]  If 7PM-7AM, please contact night-coverage [email or web address] Password [email or web address] 11/20/2013, 4:11 PM  11/20/2013, 4:04 PM  LOS: 3 days

## 2013-11-20 NOTE — Consult Note (Signed)
[facility]  Subjective  - Still confused may be slightly better   Objective 154/72 87 98.5 F (36.9 C) (Oral) 18 99%  Intake/Output Summary (Last 24 hours) at 11/20/13 1357 Last data filed at 11/20/13 0840  Gross per 24 hour  Intake    790 ml  Output   1200 ml  Net   -410 ml   + thrill in fistula  Assessment/Planning: Still confused hopefully better by Thursday for fistulogram  Lucas Calderon 11/20/2013 1:57 PM --  Laboratory Lab Results:  Recent Labs  11/17/13 1557 11/18/13 0440  WBC 10.1 10.0  HGB 8.6* 8.5*  HCT 25.2* 24.2*  PLT 189 191   BMET  Recent Labs  11/19/13 0338 11/20/13 0635  NA 146 144  K 3.3* 3.6*  CL 110 110  CO2 24 21  GLUCOSE 40* 145*  BUN 62* 52*  CREATININE 6.50* 6.15*  CALCIUM 10.9* 9.7    COAG Lab Results  Component Value Date   INR 1.27 09/26/2013   No results found for this basename: PTT

## 2013-11-20 NOTE — Progress Notes (Signed)
Acute on CKD stage 4/5  (Cr 4.77 in 3/15), improving Hypercalcemia Secondary to Vitamin D toxicitiy , resolved Immature AVF- Dr Oneida Alar aware I would not do fistulogram in setting of AKI and pt not yet on dialysis. Anemia, check Ferritin   Subjective: Interval History: none.  Objective: Vital signs in last 24 hours: Temp:  [98.1 F (36.7 C)-98.8 F (37.1 C)] 98.5 F (36.9 C) (05/04 0828) Pulse Rate:  [78-90] 87 (05/04 0828) Resp:  [16-18] 18 (05/04 0828) BP: (142-182)/(67-73) 154/72 mmHg (05/04 0828) SpO2:  [97 %-99 %] 99 % (05/04 0828) Weight:  [74.8 kg (164 lb 14.5 oz)] 74.8 kg (164 lb 14.5 oz) (05/03 2005) Weight change: -0.1 kg (-3.5 oz)  Intake/Output from previous day: 05/03 0701 - 05/04 0700 In: 910 [P.O.:360; I.V.:550] Out: 850 [Urine:850] Intake/Output this shift:    General appearance: half naked, a bit bizarre affect, gaurded Extremities: no edema, AVF small but apears to be maturing,  Lab Results:  Recent Labs  11/17/13 1557 11/18/13 0440  WBC 10.1 10.0  HGB 8.6* 8.5*  HCT 25.2* 24.2*  PLT 189 191   BMET:  Recent Labs  11/19/13 0338 11/20/13 0635  NA 146 144  K 3.3* 3.6*  CL 110 110  CO2 24 21  GLUCOSE 40* 145*  BUN 62* 52*  CREATININE 6.50* 6.15*  CALCIUM 10.9* 9.7   No results found for this basename: PTH,  in the last 72 hours Iron Studies:  Recent Labs  11/19/13 1115  IRON 66  TIBC 208*   Studies/Results: No results found.  Scheduled: . amLODipine  10 mg Oral QHS  . aspirin EC  81 mg Oral Daily  . calcitonin  400 Units Intramuscular Q24H  . carvedilol  12.5 mg Oral BID WC  . furosemide  80 mg Oral BID  . heparin  5,000 Units Subcutaneous 3 times per day  . hydrALAZINE  75 mg Oral 3 times per day  . insulin aspart  0-9 Units Subcutaneous 6 times per day  . isosorbide mononitrate  60 mg Oral Daily  . multivitamin  1 tablet Oral QHS  . sodium chloride  3 mL Intravenous Q12H     LOS: 3 days   Lucas Calderon 11/20/2013,9:18 AM

## 2013-11-21 DIAGNOSIS — E1169 Type 2 diabetes mellitus with other specified complication: Secondary | ICD-10-CM | POA: Diagnosis not present

## 2013-11-21 DIAGNOSIS — N2581 Secondary hyperparathyroidism of renal origin: Secondary | ICD-10-CM | POA: Diagnosis not present

## 2013-11-21 DIAGNOSIS — N184 Chronic kidney disease, stage 4 (severe): Secondary | ICD-10-CM | POA: Diagnosis not present

## 2013-11-21 DIAGNOSIS — N189 Chronic kidney disease, unspecified: Secondary | ICD-10-CM | POA: Diagnosis not present

## 2013-11-21 DIAGNOSIS — D631 Anemia in chronic kidney disease: Secondary | ICD-10-CM | POA: Diagnosis not present

## 2013-11-21 DIAGNOSIS — I129 Hypertensive chronic kidney disease with stage 1 through stage 4 chronic kidney disease, or unspecified chronic kidney disease: Secondary | ICD-10-CM | POA: Diagnosis not present

## 2013-11-21 DIAGNOSIS — N179 Acute kidney failure, unspecified: Secondary | ICD-10-CM | POA: Diagnosis not present

## 2013-11-21 LAB — GLUCOSE, CAPILLARY
GLUCOSE-CAPILLARY: 189 mg/dL — AB (ref 70–99)
GLUCOSE-CAPILLARY: 175 mg/dL — AB (ref 70–99)
GLUCOSE-CAPILLARY: 135 mg/dL — AB (ref 70–99)
Glucose-Capillary: 101 mg/dL — ABNORMAL HIGH (ref 70–99)
Glucose-Capillary: 125 mg/dL — ABNORMAL HIGH (ref 70–99)
Glucose-Capillary: 142 mg/dL — ABNORMAL HIGH (ref 70–99)
Glucose-Capillary: 143 mg/dL — ABNORMAL HIGH (ref 70–99)
Glucose-Capillary: 149 mg/dL — ABNORMAL HIGH (ref 70–99)
Glucose-Capillary: 154 mg/dL — ABNORMAL HIGH (ref 70–99)
Glucose-Capillary: 159 mg/dL — ABNORMAL HIGH (ref 70–99)
Glucose-Capillary: 172 mg/dL — ABNORMAL HIGH (ref 70–99)
Glucose-Capillary: 78 mg/dL (ref 70–99)
Glucose-Capillary: 80 mg/dL (ref 70–99)

## 2013-11-21 LAB — RENAL FUNCTION PANEL
Albumin: 3 g/dL — ABNORMAL LOW (ref 3.5–5.2)
BUN: 43 mg/dL — AB (ref 6–23)
CALCIUM: 9.1 mg/dL (ref 8.4–10.5)
CO2: 20 mEq/L (ref 19–32)
CREATININE: 6.02 mg/dL — AB (ref 0.50–1.35)
Chloride: 110 mEq/L (ref 96–112)
GFR calc Af Amer: 10 mL/min — ABNORMAL LOW (ref >90)
GFR calc non Af Amer: 8 mL/min — ABNORMAL LOW (ref >90)
GLUCOSE: 174 mg/dL — AB (ref 70–99)
PHOSPHORUS: 2.4 mg/dL (ref 2.3–4.6)
Potassium: 3.5 mEq/L — ABNORMAL LOW (ref 3.7–5.3)
Sodium: 144 mEq/L (ref 137–147)

## 2013-11-21 LAB — DRUGS OF ABUSE SCREEN W/O ALC, ROUTINE URINE
Amphetamine Screen, Ur: NEGATIVE
BARBITURATE QUANT UR: NEGATIVE
Benzodiazepines.: NEGATIVE
COCAINE METABOLITES: NEGATIVE
Creatinine,U: 73 mg/dL
METHADONE: NEGATIVE
Marijuana Metabolite: NEGATIVE
Opiate Screen, Urine: NEGATIVE
PHENCYCLIDINE (PCP): NEGATIVE
Propoxyphene: NEGATIVE

## 2013-11-21 LAB — FERRITIN: Ferritin: 425 ng/mL — ABNORMAL HIGH (ref 22–322)

## 2013-11-21 NOTE — Progress Notes (Signed)
[facility] PROGRESS NOTE  Lucas Calderon [id number] DOB: 1941-07-12 DOA: 11/17/2013 PCP: Maryella Shivers, MD   Brief narrative 73 year old male with history of CKD stage 4, diabetes mellitus, hypertension, GERD, anemia who presented with  mental status changes for last 1 week and worsened renal function. Patient found to have creatinine of 7.1 and  Hypocalcemia with  level of 13.2.   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 He was treated with IV fluid hydration. His creatinine has gradually improved. Nephrology has cleared him for discharge home with close outpatient followup. They have canceled fistulogram in the context of acute renal failure. Discussed with Dr. Florene Glen.  Acute encephalopathy As per sister, Lucas Calderon, he has been having frequent falls , confused and with garbled speech for 2 weeks. Possibly  in the setting of hypercalcemia and elevated creatinine. As per recent hospitalization 6 weeks back mental status was normal.  - Had episodes of hypoglycemia 5/3. Hold lantus and monitor on SSI. patient has poor po intake. - TSH: 0.862, RPR: Nonreactive. B12: 596. UDS pending. CT head without acute findings - avoid narcotics or benzos. - Discussed with patient's sister Ms. [name] on 5/5 and she indicates that patient's mental status has significantly improved compared to admission.? Baseline confusion/cognitive impairment.  hypercalcemia Secondary to vitamin D toxicity. Improving with calcitonin and hydration. Calcium improved from 13.2 on 5/1 > 9.7 on 5/4.  Diabetes mellitus with hypoglycemic episdes Hold lantus given low fsg.  monitor fsg q4 hr with SSI. Will place on d5 NS given recurrent low fsg with poor po intake. No further hypoglycemic episodes since 5/3. Will DC D5W and monitor CBGs.  HTN  continue coreg, hydralazine, imdur  and lasix bid.  Anemia in CKD Getting  Aranesp per renal. Stable  Chr diastolic  dysfunction Currently euvolemic. Continue aspirin, coreg and  statin  Code Status: Full code Family Communication: Discussed with patient's sister 5/5 Disposition Plan: DC home possibly 5/6   Consultants:  Renal  Vascular surgery  Procedures:  None  Antibiotics:  None  HPI/Subjective: Patient denies complaints. No active issues as per nursing.  Objective: Filed Vitals:   11/21/13 1500  BP: 155/68  Pulse: 76  Temp: 98.4 F (36.9 C)  Resp: 18    Intake/Output Summary (Last 24 hours) at 11/21/13 1625 Last data filed at 11/21/13 1500  Gross per 24 hour  Intake    600 ml  Output   2375 ml  Net  -1775 ml   Filed Weights   11/18/13 2100 11/19/13 2005 11/20/13 2117  Weight: 74.9 kg (165 lb 2 oz) 74.8 kg (164 lb 14.5 oz) 73.483 kg (162 lb)    Exam:   General:  Elderly male in no acute distress, lying comfortably in bed.  Chest: Clear to auscultation bilaterally, no added sounds  CVS: Normal S1 and S2, no murmurs rub or gallop  Abdomen: Soft, nontender, nondistended, bowel sounds present  Extremities: Left upper extremity AV graft  CNS: Alert and oriented to self, partly to place but not to time. No focal deficits. No obvious asterixis.  Data Reviewed: Basic Metabolic Panel:  Recent Labs Lab 11/17/13 1557 11/18/13 0440 11/19/13 0338 11/20/13 0635 11/21/13 0626  NA 144 143 146 144 144  K 4.1 3.7 3.3* 3.6* 3.5*  CL 103 103 110 110 110  CO2 26 23 24 21 20   GLUCOSE 133* 121* 40* 145* 174*  BUN 68* 65* 62* 52* 43*  CREATININE 7.12* 6.88* 6.50* 6.15* 6.02*  CALCIUM  13.2* 12.1* 10.9* 9.7 9.1  PHOS  --  4.5  --   --  2.4   Liver Function Tests:  Recent Labs Lab 11/17/13 1557 11/18/13 0440 11/21/13 0626  AST 38* 35  --   ALT 26 24  --   ALKPHOS 54 50  --   BILITOT 0.4 0.4  --   PROT 7.7 7.3  --   ALBUMIN 3.5 3.2* 3.0*   No results found for this basename: LIPASE, AMYLASE,  in the last 168 hours No results found for this basename: AMMONIA,   in the last 168 hours CBC:  Recent Labs Lab 11/17/13 1557 11/18/13 0440  WBC 10.1 10.0  HGB 8.6* 8.5*  HCT 25.2* 24.2*  MCV 84.3 83.4  PLT 189 191   Cardiac Enzymes: No results found for this basename: CKTOTAL, CKMB, CKMBINDEX, TROPONINI,  in the last 168 hours BNP (last 3 results) No results found for this basename: PROBNP,  in the last 8760 hours CBG:  Recent Labs Lab 11/20/13 2116 11/20/13 2337 11/21/13 0410 11/21/13 1017 11/21/13 1401  GLUCAP 168* 154* 172* 149* 189*    No results found for this or any previous visit (from the past 240 hour(s)).   Studies: No results found.  Scheduled Meds: . amLODipine  10 mg Oral QHS  . aspirin EC  81 mg Oral Daily  . carvedilol  12.5 mg Oral BID WC  . furosemide  80 mg Oral BID  . heparin  5,000 Units Subcutaneous 3 times per day  . hydrALAZINE  75 mg Oral 3 times per day  . isosorbide mononitrate  60 mg Oral Daily  . multivitamin  1 tablet Oral QHS  . sodium chloride  3 mL Intravenous Q12H   Continuous Infusions: . dextrose 5 % and 0.9% NaCl 100 mL/hr at 11/20/13 2250      Time spent: 25 minutes    Modena Jansky, MD, FACP, [facility]. [facility] Pager [phone]  If 7PM-7AM, please contact night-coverage [email or web address] Password [email or web address] 11/21/2013, 4:25 PM  11/21/2013, 4:25 PM  LOS: 4 days

## 2013-11-21 NOTE — Progress Notes (Signed)
Dr Florene Glen called our office and wants to hold on fistulogram for now.  Will sign off.  Pt can follow up with my partner Dr Bridgett Larsson as needed if fistula does not mature.  Ruta Hinds, MD [facility] [place] Office: [phone] Pager: [phone]

## 2013-11-21 NOTE — Progress Notes (Signed)
-       Acute on CKD stage 4/5 (Cr 4.77 in 3/15), cr today 6.02 improving.  To follow up with Dr. Joelyn Oms on May 26 at 11:45 am.  Labs at [facility] on May 12th Hypercalcemia Secondary to Vitamin D toxicitiy , resolved  Immature AVF placed 2/23--- Dr Oneida Alar aware I would not do fistulogram in setting of AKI and pt not yet on dialysis.  Anemia  Subjective: Interval History: No change.   Objective: Vital signs in last 24 hours: Temp:  [98.1 F (36.7 C)-98.4 F (36.9 C)] 98.2 F (36.8 C) (05/05 0411) Pulse Rate:  [79-82] 81 (05/05 0411) Resp:  [17-18] 17 (05/05 0411) BP: (141-175)/(64-77) 175/76 mmHg (05/05 0411) SpO2:  [94 %-99 %] 94 % (05/05 0411) Weight:  [73.483 kg (162 lb)] 73.483 kg (162 lb) (05/04 2117) Weight change: -1.317 kg (-2 lb 14.5 oz)  Intake/Output from previous day: 05/04 0701 - 05/05 0700 In: 600 [P.O.:600] Out: 3276 [Urine:3275; Stool:1] Intake/Output this shift:    General appearance: alert and cooperative GI: soft, non-tender; bowel sounds normal; no masses,  no organomegaly Extremities: extremities normal, atraumatic, no cyanosis or edema  AVF LUE small  Lab Results: No results found for this basename: WBC, HGB, HCT, PLT,  in the last 72 hours BMET:  Recent Labs  11/20/13 0635 11/21/13 0626  NA 144 144  K 3.6* 3.5*  CL 110 110  CO2 21 20  GLUCOSE 145* 174*  BUN 52* 43*  CREATININE 6.15* 6.02*  CALCIUM 9.7 9.1   No results found for this basename: PTH,  in the last 72 hours Iron Studies:  Recent Labs  11/19/13 1115  IRON 66  TIBC 208*   Studies/Results: No results found.  Scheduled: . amLODipine  10 mg Oral QHS  . aspirin EC  81 mg Oral Daily  . carvedilol  12.5 mg Oral BID WC  . furosemide  80 mg Oral BID  . heparin  5,000 Units Subcutaneous 3 times per day  . hydrALAZINE  75 mg Oral 3 times per day  . isosorbide mononitrate  60 mg Oral Daily  . multivitamin  1 tablet Oral QHS  . sodium chloride  3 mL Intravenous Q12H    LOS: 4 days   Estanislado Emms 11/21/2013,10:17 AM

## 2013-11-22 DIAGNOSIS — G9340 Encephalopathy, unspecified: Secondary | ICD-10-CM

## 2013-11-22 DIAGNOSIS — N184 Chronic kidney disease, stage 4 (severe): Secondary | ICD-10-CM | POA: Diagnosis not present

## 2013-11-22 DIAGNOSIS — N2581 Secondary hyperparathyroidism of renal origin: Secondary | ICD-10-CM | POA: Diagnosis not present

## 2013-11-22 DIAGNOSIS — N179 Acute kidney failure, unspecified: Secondary | ICD-10-CM | POA: Diagnosis not present

## 2013-11-22 DIAGNOSIS — D631 Anemia in chronic kidney disease: Secondary | ICD-10-CM | POA: Diagnosis not present

## 2013-11-22 DIAGNOSIS — E1169 Type 2 diabetes mellitus with other specified complication: Secondary | ICD-10-CM | POA: Diagnosis not present

## 2013-11-22 DIAGNOSIS — I129 Hypertensive chronic kidney disease with stage 1 through stage 4 chronic kidney disease, or unspecified chronic kidney disease: Secondary | ICD-10-CM | POA: Diagnosis not present

## 2013-11-22 LAB — RENAL FUNCTION PANEL
ALBUMIN: 3 g/dL — AB (ref 3.5–5.2)
BUN: 41 mg/dL — ABNORMAL HIGH (ref 6–23)
CO2: 22 meq/L (ref 19–32)
Calcium: 9.5 mg/dL (ref 8.4–10.5)
Chloride: 107 mEq/L (ref 96–112)
Creatinine, Ser: 6.11 mg/dL — ABNORMAL HIGH (ref 0.50–1.35)
GFR calc Af Amer: 10 mL/min — ABNORMAL LOW (ref >90)
GFR, EST NON AFRICAN AMERICAN: 8 mL/min — AB (ref >90)
Glucose, Bld: 101 mg/dL — ABNORMAL HIGH (ref 70–99)
POTASSIUM: 3.2 meq/L — AB (ref 3.7–5.3)
Phosphorus: 2.9 mg/dL (ref 2.3–4.6)
Sodium: 143 mEq/L (ref 137–147)

## 2013-11-22 LAB — GLUCOSE, CAPILLARY
GLUCOSE-CAPILLARY: 98 mg/dL (ref 70–99)
Glucose-Capillary: 133 mg/dL — ABNORMAL HIGH (ref 70–99)

## 2013-11-22 MED ORDER — POTASSIUM CHLORIDE CRYS ER 20 MEQ PO TBCR: 40.0000 meq | EXTENDED_RELEASE_TABLET | Freq: Once | ORAL | Status: DC

## 2013-11-22 MED ORDER — HYDRALAZINE HCL 50 MG PO TABS: 75.0000 mg | ORAL_TABLET | Freq: Three times a day (TID) | ORAL | Status: DC

## 2013-11-22 MED ORDER — RENA-VITE PO TABS: 1.0000 | ORAL_TABLET | Freq: Every day | ORAL | Status: AC

## 2013-11-22 MED ORDER — FUROSEMIDE 80 MG PO TABS: 80.0000 mg | ORAL_TABLET | Freq: Two times a day (BID) | ORAL | Status: DC

## 2013-11-22 NOTE — Discharge Summary (Signed)
Physician Discharge Summary  Lucas Calderon DOB: 1941-04-20 DOA: 11/17/2013  PCP: Maryella Shivers, MD Primary Nephrologist: Dr. Pearson Grippe  Admit date: 11/17/2013 Discharge date: 11/22/2013  Time spent: Greater than 30 minutes  Recommendations for Outpatient Follow-up:  1. Dr. Pearson Grippe, Nephrology on 12/12/2013 at 11:45 AM. 2. Lab work (CBC & Renal panel) at [facility] on 11/28/2013. 3. Dr. Maryella Shivers, PCP in 1 week. 4. Home health PT, OT, RN, rolling walker with 5 inch wheels and 3 in 1. Family able to provide 24/7 supervision and assistance.  Discharge Diagnoses:  Principal Problem:   Hypercalcemia Active Problems:   CKD (chronic kidney disease), stage V   Diabetes mellitus, type 2   Hypoglycemia   Discharge Condition: Improved & Stable  Diet recommendation: Heart Healthy and Diabetic diet.  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 Apparently over week PTA patient has been confused. No reported history of dyspnea, cough, nausea, vomiting, diarrhea, cramps, itching. Patient was found to have creatinine of 7.12 and calcium 13.2. He had been on PhosLo and calcitriol PTA. Patient had AVF placed on 09/11/13.  Hospital Course:   1. Acute encephalopathy: Attributed to hypercalcemia and worsening renal functions. Patient was treated conservatively for underlying conditions. With improvement in renal functions and resolution of hypercalcemia, mental status has gradually improved. Suspect patient has some underlying cognitive impairment/possible dementia. Discussed with patient's sister yesterday who states that patient's mental status has  significantly improved compared to admission. TSH: 0.862, RPR: Nonreactive, B12: 596 and CT head without acute findings. 2. Acute on stage 4/5 chronic kidney disease: Nephrology was consulted. Treated with a brief IV fluids. Creatinine has gradually improved to 6.1 on today. As per nephrology, no uremic symptoms and they have cleared him for discharge home with close outpatient followup with repeat labs. AV fistula immature. Fistulogram held secondary to acute renal failure. 3. Hypertension: Reasonably controlled with home medications. Continue hydralazine, carvedilol and amlodipine. 4. Anemia secondary to chronic kidney disease: Stable. Continue ferrous sulfate. Status post dose of Aranesp. 5. Hypercalcemia: Likely secondary to vitamin D toxicity. Calcitriol and PhosLo were stopped. Treated with normal saline, Lasix and calcitonin. Resolved. 6. Metabolic bone disease: Management per nephrology. 7. Type II DM with nephropathy: Patient's Lantus and sliding scale were held secondary to hypoglycemic episodes. Patient briefly was on IV D5W. Currently CBGs are stable in the 98-175 mg per DL range. His insulin requirement might have decreased secondary to worsening renal functions. Recommend monitoring CBGs closely as outpatient and consider resuming insulin at lower dose if CBG's starts being consistently elevated greater than 180. 8. History of chronic diastolic dysfunction: Stable 9. Hypokalemia: Replaced prior to discharge.    Consultations:  Nephrology  Vascular surgery  Procedures:  None   Discharge Exam:  Complaints:  "I feel great". Anxious to go home.  Filed Vitals:   11/21/13 1958 11/22/13 0514 11/22/13 0802 11/22/13 1150  BP: 161/69 150/65 127/60 149/69  Pulse: 76 74 75 72  Temp: 98.6 F (37 C) 98.6 F (37 C) 98.5 F (36.9 C) 98.1 F (36.7 C)  TempSrc:   Oral Oral  Resp: 18 17 16 16   Height:  Weight: 72.213 kg (159 lb 3.2 oz)     SpO2: 98% 97% 98% 98%    General  exam: Pleasant elderly male sitting comfortably on chair and in no obvious distress.  Respiratory system: Clear. No increased work of breathing. Cardiovascular system: S1 & S2 heard, RRR. No JVD, murmurs, gallops, clicks or pedal edema. Gastrointestinal system: Abdomen is nondistended, soft and nontender. Normal bowel sounds heard. Central nervous system: Alert and oriented to person, place and partly to time. No focal neurological deficits. Extremities: Symmetric 5 x 5 power.No asterixis. Partially amputated left index finger.  Discharge Instructions      Discharge Orders   Future Orders Complete By Expires   Call MD for:  difficulty breathing, headache or visual disturbances  As directed    Call MD for:  extreme fatigue  As directed    Call MD for:  persistant dizziness or light-headedness  As directed    Call MD for:  persistant nausea and vomiting  As directed    Call MD for:  As directed    Diet - low sodium heart healthy  As directed    Diet Carb Modified  As directed    Increase activity slowly  As directed        Medication List    STOP taking these medications       calcitRIOL 0.25 MCG capsule  Commonly known as:  ROCALTROL     calcium acetate 667 MG capsule  Commonly known as:  PHOSLO     insulin glargine 100 UNIT/ML injection  Commonly known as:  LANTUS     NOVOLIN R RELION 100 units/mL injection  Generic drug:  insulin regular      TAKE these medications       amLODipine 10 MG tablet  Commonly known as:  NORVASC  Take 10 mg by mouth daily.     aspirin 81 MG tablet  Take 81 mg by mouth daily.     carvedilol 12.5 MG tablet  Commonly known as:  COREG  Take 12.5 mg by mouth 2 (two) times daily with a meal.     ferrous sulfate 325 (65 FE) MG EC tablet  Take 325 mg by mouth daily with breakfast.     furosemide 80 MG tablet  Commonly known as:  LASIX  Take 1 tablet (80 mg total) by mouth 2 (two) times daily.     hydrALAZINE 50 MG tablet  Commonly  known as:  APRESOLINE  Take 1.5 tablets (75 mg total) by mouth 3 (three) times daily.     isosorbide mononitrate 60 MG 24 hr tablet  Commonly known as:  IMDUR  Take 60 mg by mouth daily.     PROVENTIL HFA 108 (90 BASE) MCG/ACT inhaler  Generic drug:  albuterol  Inhale 2 puffs into the lungs every 6 (six) hours as needed for wheezing or shortness of breath.     VITA-BEE/C Tabs  Take 1 tablet by mouth daily.     multivitamin Tabs tablet  Take 1 tablet by mouth at bedtime.       Follow-up Information   Follow up with Rexene Agent, MD On 12/12/2013. (at 11:45 AM.)    Specialty:  Nephrology   Contact information:   [place] [id number] [phone]       Follow up with [facility] On 11/28/2013. (fo lab work (CBC & Renal panel).)       Follow up with HODGES,FRANCISCO, MD. Schedule an appointment as  soon as possible for a visit in 1 week.   Specialty:  Family Medicine       The results of significant diagnostics from this hospitalization (including imaging, microbiology, ancillary and laboratory) are listed below for reference.    Significant Diagnostic Studies: Ct Head Wo Contrast  11/17/2013   CLINICAL DATA:  confusion  EXAM: CT HEAD WITHOUT CONTRAST  TECHNIQUE: Contiguous axial images were obtained from the base of the skull through the vertex without intravenous contrast.  COMPARISON:  CT HEAD W/O CM dated 11/17/2013  FINDINGS: No acute intracranial abnormality. Specifically, no hemorrhage, hydrocephalus, mass lesion, acute infarction, or significant intracranial injury. No acute calvarial abnormality. Stable mild global atrophy. Mild areas low attenuation within the subcortical, deep, and periventricular white matter regions consistent with small vessel white matter ischemic changes.  IMPRESSION: No acute intracranial abnormality.   Electronically Signed   By: Margaree Mackintosh M.D.   On: 11/17/2013 18:39   Dg Chest Port 1 View  11/17/2013   CLINICAL DATA:  weakness   EXAM: PORTABLE CHEST - 1 VIEW  COMPARISON:  None.  FINDINGS: There is mild elevation of the right hemidiaphragm. Cardiac silhouette is moderately enlarged. Aorta is tortuous with atherosclerotic calcifications. The lungs are clear. Mild degenerative changes within the shoulders.  IMPRESSION: No active disease.   Electronically Signed   By: Margaree Mackintosh M.D.   On: 11/17/2013 16:13    Microbiology: No results found for this or any previous visit (from the past 240 hour(s)).   Labs: Basic Metabolic Panel:  Recent Labs Lab 11/18/13 0440 11/19/13 0338 11/20/13 0635 11/21/13 0626 11/22/13 0646  NA 143 146 144 144 143  K 3.7 3.3* 3.6* 3.5* 3.2*  CL 103 110 110 110 107  CO2 23 24 21 20 22   GLUCOSE 121* 40* 145* 174* 101*  BUN 65* 62* 52* 43* 41*  CREATININE 6.88* 6.50* 6.15* 6.02* 6.11*  CALCIUM 12.1* 10.9* 9.7 9.1 9.5  PHOS 4.5  --   --  2.4 2.9   Liver Function Tests:  Recent Labs Lab 11/17/13 1557 11/18/13 0440 11/21/13 0626 11/22/13 0646  AST 38* 35  --   --   ALT 26 24  --   --   ALKPHOS 54 50  --   --   BILITOT 0.4 0.4  --   --   PROT 7.7 7.3  --   --   ALBUMIN 3.5 3.2* 3.0* 3.0*   No results found for this basename: LIPASE, AMYLASE,  in the last 168 hours No results found for this basename: AMMONIA,  in the last 168 hours CBC:  Recent Labs Lab 11/17/13 1557 11/18/13 0440  WBC 10.1 10.0  HGB 8.6* 8.5*  HCT 25.2* 24.2*  MCV 84.3 83.4  PLT 189 191   Cardiac Enzymes: No results found for this basename: CKTOTAL, CKMB, CKMBINDEX, TROPONINI,  in the last 168 hours BNP: BNP (last 3 results) No results found for this basename: PROBNP,  in the last 8760 hours CBG:  Recent Labs Lab 11/21/13 1643 11/21/13 1959 11/21/13 2320 11/22/13 0800 11/22/13 1149  GLUCAP 175* 135* 125* 98 133*    Additional labs: 1. Iron panel: Iron 66, TIBC 208, saturation ratio 32 and ferritin 425 2. Vitamin B12: 596 3. TSH: 0.862 4. PTH: 7.5 5. HIV-antibody:  Nonreactive 6. RPR: Nonreactive 7. Hepatitis B surface antigen: Negative 8. UDS: Negative   Signed:  Modena Jansky, MD, FACP, FHM. [facility] Pager [phone]  If 7PM-7AM, please contact night-coverage  [email or web address] Password [email or web address] 11/22/2013, 12:22 PM

## 2013-11-22 NOTE — Progress Notes (Signed)
Paged OT to come eval pt before discharge home

## 2013-11-22 NOTE — Progress Notes (Signed)
NUTRITION FOLLOW-UP  DOCUMENTATION CODES Per approved criteria  -Not Applicable   INTERVENTION: RD provided "Low Sodium Nutrition Therapy" for patient to review. RD contact information provided. Currently oral intake adequate, if po intake declines, recommend Nepro Shake daily. RD to continue to follow nutrition care plan.  NUTRITION DIAGNOSIS: Inadequate oral intake related to AMS, confusion as evidenced by family report on admission. Improving.  Goal: Intake to meet >90% of estimated nutrition needs.  Monitor:  weight trends, lab trends, I/O's, PO intake, supplement tolerance  ASSESSMENT: Pt admitted from PCP with hypercalcemia and confusion- indications of worsening renal failure and need for dialysis.  Pt is not on dialysis at home.  He does have AVF as dialysis has been expected.   Acute on CKD is improving. Renal has put fistulogram on hold as of now.  Patient ate 100% of his breakfast this morning. He remains slightly confused, discussed with RN. Pt repeating to me that he is leaving today.   Potassium is low at 3.2 Phosphorus WNL CBG's: 98 - 135  Height: Ht Readings from Last 1 Encounters:  11/17/13 5\' 8"  (1.727 m)    Weight: Wt Readings from Last 1 Encounters:  11/21/13 159 lb 3.2 oz (72.213 kg)  Admit wt 165 lb  BMI:  Body mass index is 24.21 kg/(m^2). WNL  Estimated Nutritional Needs: Kcal: 1875 - 2000 Protein: 60 - 70 g Fluid: ~2.0 L/day or per MD  Skin: intact  Diet Order: Renal with 1200 ml Fluid Restriction  EDUCATION NEEDS: -Education not appropriate at this time   Intake/Output Summary (Last 24 hours) at 11/22/13 0922 Last data filed at 11/22/13 0804  Gross per 24 hour  Intake    540 ml  Output   2975 ml  Net  -2435 ml    Last BM: PTA  Labs:   Recent Labs Lab 11/18/13 0440  11/20/13 0635 11/21/13 0626 11/22/13 0646  NA 143  < > 144 144 143  K 3.7  < > 3.6* 3.5* 3.2*  CL 103  < > 110 110 107  CO2 23  < > 21 20 22   BUN 65*   < > 52* 43* 41*  CREATININE 6.88*  < > 6.15* 6.02* 6.11*  CALCIUM 12.1*  < > 9.7 9.1 9.5  PHOS 4.5  --   --  2.4 2.9  GLUCOSE 121*  < > 145* 174* 101*  < > = values in this interval not displayed.  CBG (last 3)   Recent Labs  11/21/13 1959 11/21/13 2320 11/22/13 0800  GLUCAP 135* 125* 98    Scheduled Meds: . amLODipine  10 mg Oral QHS  . aspirin EC  81 mg Oral Daily  . carvedilol  12.5 mg Oral BID WC  . furosemide  80 mg Oral BID  . heparin  5,000 Units Subcutaneous 3 times per day  . hydrALAZINE  75 mg Oral 3 times per day  . isosorbide mononitrate  60 mg Oral Daily  . multivitamin  1 tablet Oral QHS  . sodium chloride  3 mL Intravenous Q12H    Continuous Infusions:    Inda Coke MS, RD, LDN Inpatient Registered Dietitian Pager: [phone] After-hours pager: [phone]

## 2013-11-22 NOTE — Progress Notes (Signed)
Paged OT again. Got live person stated they would have someone to eval asap.

## 2013-11-22 NOTE — Progress Notes (Signed)
Pt working with patient in hall with walker without difficulty.  Pleasant

## 2013-11-22 NOTE — Progress Notes (Signed)
Acute on CKD stage 4/5 (Cr 4.77 in 3/15), cr today 6.02 improving. To follow up with Dr. Joelyn Oms on May 26 at 11:45 am. Labs at [facility] on May 12th  Hypercalcemia Secondary to Vitamin D toxicitiy , resolved  Immature AVF placed 2/23--- Dr Oneida Alar aware I would not do fistulogram in setting of AKI and pt not yet on dialysis.  Anemia Hypokalemia. Supplemented today  Subjective: Interval History: none.  Objective: Vital signs in last 24 hours: Temp:  [98.4 F (36.9 C)-98.6 F (37 C)] 98.5 F (36.9 C) (05/06 0802) Pulse Rate:  [74-76] 75 (05/06 0802) Resp:  [16-18] 16 (05/06 0802) BP: (127-161)/(60-69) 127/60 mmHg (05/06 0802) SpO2:  [97 %-99 %] 98 % (05/06 0802) Weight:  [72.213 kg (159 lb 3.2 oz)] 72.213 kg (159 lb 3.2 oz) (05/05 1958) Weight change: -1.27 kg (-2 lb 12.8 oz)  Intake/Output from previous day: 05/05 0701 - 05/06 0700 In: 540 [P.O.:540] Out: 2800 [Urine:2800] Intake/Output this shift: Total I/O In: -  Out: 175 [Urine:175]  looks pretty good today, more fluent speech  Lab Results: No results found for this basename: WBC, HGB, HCT, PLT,  in the last 72 hours BMET:  Recent Labs  11/21/13 0626 11/22/13 0646  NA 144 143  K 3.5* 3.2*  CL 110 107  CO2 20 22  GLUCOSE 174* 101*  BUN 43* 41*  CREATININE 6.02* 6.11*  CALCIUM 9.1 9.5   No results found for this basename: PTH,  in the last 72 hours Iron Studies:  Recent Labs  11/21/13 0626  FERRITIN 425*   Studies/Results: No results found.  Scheduled: . amLODipine  10 mg Oral QHS  . aspirin EC  81 mg Oral Daily  . carvedilol  12.5 mg Oral BID WC  . furosemide  80 mg Oral BID  . heparin  5,000 Units Subcutaneous 3 times per day  . hydrALAZINE  75 mg Oral 3 times per day  . isosorbide mononitrate  60 mg Oral Daily  . multivitamin  1 tablet Oral QHS  . sodium chloride  3 mL Intravenous Q12H     LOS: 5 days   Estanislado Emms 11/22/2013,11:37 AM

## 2013-11-22 NOTE — Evaluation (Addendum)
Occupational Therapy Evaluation Patient Details Name: Lucas Calderon MRN: [id number] DOB: 1940-08-28 Today's Date: 11/22/2013    History of Present Illness Jilberto Vanderwall is a 73 y.o. male who presents to ED today with increasing confusion and lethargy for the past 2 weeks.  He was taken to doctor in [place] today due to above symptoms, she ordered labs which came back demonstrating worsening of his CKD stage 5 and hypercalcemia.  He was sent to [facility]; PT ordered 5/5; Per MD notes, pt's mental status is at or close to baseline   Clinical Impression   Pt presents with below problem list. Recommending HHOT and 24/7 assistance upon d/c. Spoke with case manager and nurse about recommending [place]. Pt planning to d/c today, so no goals set. All further needs can be met in next venue of care.     Follow Up Recommendations  Home health OT;Supervision/Assistance - 24 hour    Equipment Recommendations  None recommended by OT    Recommendations for Other Services       Precautions / Restrictions Precautions Precautions: Fall Restrictions Weight Bearing Restrictions: No      Mobility Bed Mobility               General bed mobility comments: pt up in chair  Transfers Overall transfer level: Needs assistance Equipment used: Rolling walker (2 wheeled) Transfers: Sit to/from Stand Sit to Stand: Min guard         General transfer comment: cues for technique.    Balance                                            ADL Overall ADL's : Needs assistance/impaired                     Lower Body Dressing: Min guard;Sit to/from stand   Toilet Transfer: Minimal assistance;Moderate assistance;Ambulation;Regular Toilet;Grab bars;RW       Tub/ Shower Transfer: Minimal assistance;Ambulation;Rolling walker (practiced stepping over)   Functional mobility during ADLs: Minimal assistance;Moderate assistance;Rolling walker General ADL Comments: Educated on safe  shoewear and use of bag on walker. Recommended family pick up rugs in house. Recommended sitting for bathing and dressing. Practiced tub transfer and toilet transfer. Pt having difficult time sidestepping through tight spaces requiring Mod A.      Vision                     Perception     Praxis      Pertinent Vitals/Pain No pain reported. Stated back felt tired and pt took breaks.      Hand Dominance     Extremity/Trunk Assessment Upper Extremity Assessment Upper Extremity Assessment: Overall WFL for tasks assessed   Lower Extremity Assessment Lower Extremity Assessment: Defer to PT evaluation       Communication Communication Communication: No difficulties   Cognition Arousal/Alertness: Awake/alert Behavior During Therapy: WFL for tasks assessed/performed;Impulsive Overall Cognitive Status: No family/caregiver present to determine baseline cognitive functioning; pt intitially stating it was 2016. OT told pt year and asked him a few times throughout session and pt to able to state 2015. Cues to help maintain attention during session.                      General Comments       Exercises  Shoulder Instructions      Home Living Family/patient expects to be discharged to:: Private residence Living Arrangements: Other relatives Available Help at Discharge: Family Type of Home: Mobile home Home Access: Stairs to enter Entrance Stairs-Number of Steps: 4 Entrance Stairs-Rails: [place]: One level     Bathroom Shower/Tub: Teacher, early years/pre: [place] - single point;Walker - standard; shower chair          Prior Functioning/Environment          Comments: Per PT eval: Per pt, he uses a standard walker and cane at home prn, still, noted recent falls; he also reports his neice does cooking and housekeeping, and his sister can help whenever needed -- still he was so excited at the notion  of going home, I believe he may simply be telling me what will get him home    OT Diagnosis: Cognitive deficits;Other (comment) (decreased balance)   OT Problem List: Impaired balance (sitting and/or standing);Decreased cognition;Decreased knowledge of use of DME or AE;Decreased knowledge of precautions;Decreased safety awareness;Decreased activity tolerance   OT Treatment/Interventions:      OT Goals(Current goals can be found in the care plan section) Acute Rehab OT Goals Patient Stated Goal: go home  OT Frequency:     Barriers to D/C:            Co-evaluation              End of Session Equipment Utilized During Treatment: Gait belt;Rolling walker Nurse Communication: Mobility status  Activity Tolerance: Patient tolerated treatment well Patient left: in chair;with chair alarm set   Time: 1416-1437 OT Time Calculation (min): 21 min Charges:  OT General Charges $OT Visit: 1 Procedure OT Evaluation $Initial OT Evaluation Tier I: 1 Procedure OT Treatments $Self Care/Home Management : 8-22 mins G-Codes:    Benito Mccreedy OTR/L [phone] 11/22/2013, 2:57 PM

## 2013-11-22 NOTE — Progress Notes (Signed)
Pt discharge instructions given,  Pt and sister verbalized understanding.  VSS.  Denies pain. Pleasant.  Pt left floor via wheelchair accompanied by family.

## 2013-11-22 NOTE — Progress Notes (Signed)
CARE MANAGEMENT NOTE 11/22/2013  Patient:  Lucas Calderon, Lucas Calderon   Account Number:  [id number]  Date Initiated:  11/22/2013  Documentation initiated by:  Lizabeth Leyden  Subjective/Objective Assessment:   admitted from home with increased confusion, worsening CKD5     Action/Plan:   active with [facility] HHPT   Anticipated DC Date:  11/23/2013   Anticipated DC Plan:  [place]  CM consult      [facility] Choice  Resumption Of Svcs/PTA Provider   Choice offered to / List presented to:             Status of service:  In process, will continue to follow Medicare Important Message given?   (If response is "NO", the following Medicare IM given date fields will be blank) Date Medicare IM given:   Date Additional Medicare IM given:    Discharge Disposition:    Per UR Regulation:    If discussed at Long Length of Stay Meetings, dates discussed:    Comments:  11/22/2013  [address], [place] [facility]/Mary verified patient is active with [place] office HHPT

## 2013-11-22 NOTE — Evaluation (Signed)
Physical Therapy Evaluation Patient Details Name: Lucas Calderon MRN: [id number] DOB: 1941/07/02 Today's Date: 11/22/2013   History of Present Illness  Lucas Calderon is a 73 y.o. male who presents to ED today with increasing confusion and lethargy for the past 2 weeks.  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  He was sent to [facility]; PT ordered 5/5; Per MD notes, pt's mental status is at or close to baseline  Clinical Impression   Pt admitted with above. Pt currently with functional limitations due to the deficits listed below (see PT Problem List).   Pt will benefit from skilled PT to increase their independence and safety with mobility to allow discharge to the venue listed below.   Pt very much wants to dc home, and it is likely that home will be the most therapeutic place for pt; Hopeful that pt can have 24 hour assist at home -- pt indicated he does, but this must be clarified; Will ask for HHPT follow up, HHRN follow-up, and RW      Follow Up Recommendations Home health PT;Supervision/Assistance - 24 hour HHRN as well    Equipment Recommendations  Rolling walker with 5" wheels;3in1 (PT)    Recommendations for Other Services OT consult     Precautions / Restrictions Precautions Precautions: Fall Restrictions Weight Bearing Restrictions: No      Mobility  Bed Mobility Overal bed mobility: Needs Assistance Bed Mobility: Supine to Sit     Supine to sit: Min assist     General bed mobility comments: Good initiation and almost made it to sit without assist, but needed handheld assist to acheive full upright sitting  Transfers Overall transfer level: Needs assistance Equipment used: Rolling walker (2 wheeled) Transfers: Sit to/from Stand Sit to Stand: Min guard         General transfer comment: Cues for safety and hand  placement  Ambulation/Gait Ambulation/Gait assistance: Min guard;Min assist Ambulation Distance (Feet): 220 Feet Assistive device: Rolling walker (2 wheeled) Gait Pattern/deviations:  (Erratic step width)     General Gait Details: Needing cues for safety and RW proximity for optimal support, and for kkeping feet within RW's base of support; Pt quite easily distractable, speaking with every person we passed in hallway; needing occasional min assist for RW maneuvering, in particular with turns, and as pt would impulsively turn his body to interact with others on the hallway  Stairs            Wheelchair Mobility    Modified Rankin (Stroke Patients Only)       Balance Overall balance assessment: Needs assistance Sitting-balance support: No upper extremity supported;Feet supported Sitting balance-Leahy Scale: Good     Standing balance support: Single extremity supported;Bilateral upper extremity supported Standing balance-Leahy Scale: Fair Standing balance comment: Standing at sink to wash hands                             Pertinent Vitals/Pain no apparent distress     Home Living Family/patient expects to be discharged to:: Private residence Living Arrangements: Other relatives Available Help at Discharge: Family Type of Home: Mobile home Home Access: Stairs to enter Entrance Stairs-Rails: Psychiatric nurse of Steps: 4 Home Layout: One level Home Equipment: Cane - single point;Walker - standard      Prior Function  Comments: Per pt, he uses a standard walker and cane at home prn, still, noted recent falls; he also reports his neice does cooking and housekeeping, and his sister can help whenever needed -- still he was so excited at the notion of going home, I believe he may simply be telling me what will get him home     Hand Dominance        Extremity/Trunk Assessment   Upper Extremity Assessment: Overall WFL for  tasks assessed           Lower Extremity Assessment: Generalized weakness         Communication   Communication: No difficulties  Cognition Arousal/Alertness: Awake/alert Behavior During Therapy: WFL for tasks assessed/performed;Impulsive Overall Cognitive Status: No family/caregiver present to determine baseline cognitive functioning                      General Comments      Exercises        Assessment/Plan    PT Assessment Patient needs continued PT services  PT Diagnosis Difficulty walking   PT Problem List Decreased strength;Decreased activity tolerance;Decreased balance;Decreased mobility;Decreased coordination;Decreased cognition;Decreased knowledge of use of DME;Decreased safety awareness;Decreased knowledge of precautions  PT Treatment Interventions DME instruction;Gait training;Stair training;Functional mobility training;Therapeutic activities;Therapeutic exercise;Patient/family education;Cognitive remediation;Neuromuscular re-education;Balance training   PT Goals (Current goals can be found in the Care Plan section) Acute Rehab PT Goals Patient Stated Goal: when I mentioned it' smy job to make recommendations to get him home, he gave me a high five PT Goal Formulation: With patient Time For Goal Achievement: 11/29/13 Potential to Achieve Goals: Good    Frequency Min 3X/week   Barriers to discharge Decreased caregiver support Will need a clearer picture of available home assist    Co-evaluation               End of Session Equipment Utilized During Treatment: Gait belt Activity Tolerance: Patient tolerated treatment well Patient left: in chair;with call bell/phone within reach;with chair alarm set Nurse Communication: Mobility status         Time: 0830-0900 PT Time Calculation (min): 30 min   Charges:   PT Evaluation $Initial PT Evaluation Tier I: 1 Procedure PT Treatments $Gait Training: 23-37 mins   PT G Codes:           [facility] [place] 11/22/2013, 9:19 AM Roney Marion, [place] Pager [phone] Office [phone]

## 2013-11-23 DIAGNOSIS — IMO0001 Reserved for inherently not codable concepts without codable children: Secondary | ICD-10-CM | POA: Diagnosis not present

## 2013-11-23 DIAGNOSIS — N189 Chronic kidney disease, unspecified: Secondary | ICD-10-CM | POA: Diagnosis not present

## 2013-11-23 DIAGNOSIS — R269 Unspecified abnormalities of gait and mobility: Secondary | ICD-10-CM | POA: Diagnosis not present

## 2013-11-23 DIAGNOSIS — I150 Renovascular hypertension: Secondary | ICD-10-CM | POA: Diagnosis not present

## 2013-11-23 DIAGNOSIS — I509 Heart failure, unspecified: Secondary | ICD-10-CM | POA: Diagnosis not present

## 2013-11-23 SURGERY — ASSESSMENT, SHUNT FUNCTION, WITH CONTRAST RADIOGRAPHIC STUDY
Anesthesia: LOCAL

## 2013-11-28 DIAGNOSIS — I509 Heart failure, unspecified: Secondary | ICD-10-CM | POA: Diagnosis not present

## 2013-11-28 DIAGNOSIS — E1165 Type 2 diabetes mellitus with hyperglycemia: Secondary | ICD-10-CM | POA: Diagnosis not present

## 2013-11-28 DIAGNOSIS — IMO0001 Reserved for inherently not codable concepts without codable children: Secondary | ICD-10-CM | POA: Diagnosis not present

## 2013-11-28 DIAGNOSIS — R630 Anorexia: Secondary | ICD-10-CM | POA: Diagnosis not present

## 2013-11-28 DIAGNOSIS — G4700 Insomnia, unspecified: Secondary | ICD-10-CM | POA: Diagnosis not present

## 2013-11-28 DIAGNOSIS — D631 Anemia in chronic kidney disease: Secondary | ICD-10-CM | POA: Diagnosis not present

## 2013-11-28 DIAGNOSIS — I150 Renovascular hypertension: Secondary | ICD-10-CM | POA: Diagnosis not present

## 2013-11-28 DIAGNOSIS — R269 Unspecified abnormalities of gait and mobility: Secondary | ICD-10-CM | POA: Diagnosis not present

## 2013-11-28 DIAGNOSIS — N19 Unspecified kidney failure: Secondary | ICD-10-CM | POA: Diagnosis not present

## 2013-11-28 DIAGNOSIS — IMO0002 Reserved for concepts with insufficient information to code with codable children: Secondary | ICD-10-CM | POA: Diagnosis not present

## 2013-11-28 DIAGNOSIS — N184 Chronic kidney disease, stage 4 (severe): Secondary | ICD-10-CM | POA: Diagnosis not present

## 2013-11-28 DIAGNOSIS — N189 Chronic kidney disease, unspecified: Secondary | ICD-10-CM | POA: Diagnosis not present

## 2013-11-29 DIAGNOSIS — I150 Renovascular hypertension: Secondary | ICD-10-CM | POA: Diagnosis not present

## 2013-11-29 DIAGNOSIS — R269 Unspecified abnormalities of gait and mobility: Secondary | ICD-10-CM | POA: Diagnosis not present

## 2013-11-29 DIAGNOSIS — N189 Chronic kidney disease, unspecified: Secondary | ICD-10-CM | POA: Diagnosis not present

## 2013-11-29 DIAGNOSIS — I509 Heart failure, unspecified: Secondary | ICD-10-CM | POA: Diagnosis not present

## 2013-11-29 DIAGNOSIS — IMO0001 Reserved for inherently not codable concepts without codable children: Secondary | ICD-10-CM | POA: Diagnosis not present

## 2013-11-30 DIAGNOSIS — N179 Acute kidney failure, unspecified: Secondary | ICD-10-CM | POA: Diagnosis not present

## 2013-12-01 DIAGNOSIS — N189 Chronic kidney disease, unspecified: Secondary | ICD-10-CM | POA: Diagnosis not present

## 2013-12-01 DIAGNOSIS — R269 Unspecified abnormalities of gait and mobility: Secondary | ICD-10-CM | POA: Diagnosis not present

## 2013-12-01 DIAGNOSIS — IMO0001 Reserved for inherently not codable concepts without codable children: Secondary | ICD-10-CM | POA: Diagnosis not present

## 2013-12-01 DIAGNOSIS — I150 Renovascular hypertension: Secondary | ICD-10-CM | POA: Diagnosis not present

## 2013-12-01 DIAGNOSIS — I509 Heart failure, unspecified: Secondary | ICD-10-CM | POA: Diagnosis not present

## 2013-12-05 DIAGNOSIS — N189 Chronic kidney disease, unspecified: Secondary | ICD-10-CM | POA: Diagnosis not present

## 2013-12-05 DIAGNOSIS — R269 Unspecified abnormalities of gait and mobility: Secondary | ICD-10-CM | POA: Diagnosis not present

## 2013-12-05 DIAGNOSIS — I509 Heart failure, unspecified: Secondary | ICD-10-CM | POA: Diagnosis not present

## 2013-12-05 DIAGNOSIS — IMO0001 Reserved for inherently not codable concepts without codable children: Secondary | ICD-10-CM | POA: Diagnosis not present

## 2013-12-05 DIAGNOSIS — I150 Renovascular hypertension: Secondary | ICD-10-CM | POA: Diagnosis not present

## 2013-12-06 DIAGNOSIS — N189 Chronic kidney disease, unspecified: Secondary | ICD-10-CM | POA: Diagnosis not present

## 2013-12-06 DIAGNOSIS — I150 Renovascular hypertension: Secondary | ICD-10-CM | POA: Diagnosis not present

## 2013-12-06 DIAGNOSIS — IMO0001 Reserved for inherently not codable concepts without codable children: Secondary | ICD-10-CM | POA: Diagnosis not present

## 2013-12-06 DIAGNOSIS — I509 Heart failure, unspecified: Secondary | ICD-10-CM | POA: Diagnosis not present

## 2013-12-06 DIAGNOSIS — R269 Unspecified abnormalities of gait and mobility: Secondary | ICD-10-CM | POA: Diagnosis not present

## 2013-12-07 DIAGNOSIS — I150 Renovascular hypertension: Secondary | ICD-10-CM | POA: Diagnosis not present

## 2013-12-07 DIAGNOSIS — N189 Chronic kidney disease, unspecified: Secondary | ICD-10-CM | POA: Diagnosis not present

## 2013-12-07 DIAGNOSIS — N184 Chronic kidney disease, stage 4 (severe): Secondary | ICD-10-CM | POA: Diagnosis not present

## 2013-12-07 DIAGNOSIS — IMO0001 Reserved for inherently not codable concepts without codable children: Secondary | ICD-10-CM | POA: Diagnosis not present

## 2013-12-07 DIAGNOSIS — D638 Anemia in other chronic diseases classified elsewhere: Secondary | ICD-10-CM | POA: Diagnosis not present

## 2013-12-07 DIAGNOSIS — R269 Unspecified abnormalities of gait and mobility: Secondary | ICD-10-CM | POA: Diagnosis not present

## 2013-12-07 DIAGNOSIS — I509 Heart failure, unspecified: Secondary | ICD-10-CM | POA: Diagnosis not present

## 2013-12-08 DIAGNOSIS — N184 Chronic kidney disease, stage 4 (severe): Secondary | ICD-10-CM | POA: Diagnosis not present

## 2013-12-08 DIAGNOSIS — I10 Essential (primary) hypertension: Secondary | ICD-10-CM | POA: Diagnosis not present

## 2013-12-08 DIAGNOSIS — N039 Chronic nephritic syndrome with unspecified morphologic changes: Secondary | ICD-10-CM | POA: Diagnosis not present

## 2013-12-08 DIAGNOSIS — N185 Chronic kidney disease, stage 5: Secondary | ICD-10-CM | POA: Diagnosis not present

## 2013-12-08 DIAGNOSIS — D631 Anemia in chronic kidney disease: Secondary | ICD-10-CM | POA: Diagnosis not present

## 2013-12-12 DIAGNOSIS — M79609 Pain in unspecified limb: Secondary | ICD-10-CM | POA: Diagnosis not present

## 2013-12-12 DIAGNOSIS — R269 Unspecified abnormalities of gait and mobility: Secondary | ICD-10-CM | POA: Diagnosis not present

## 2013-12-12 DIAGNOSIS — IMO0001 Reserved for inherently not codable concepts without codable children: Secondary | ICD-10-CM | POA: Diagnosis not present

## 2013-12-12 DIAGNOSIS — B351 Tinea unguium: Secondary | ICD-10-CM | POA: Diagnosis not present

## 2013-12-12 DIAGNOSIS — I509 Heart failure, unspecified: Secondary | ICD-10-CM | POA: Diagnosis not present

## 2013-12-12 DIAGNOSIS — N189 Chronic kidney disease, unspecified: Secondary | ICD-10-CM | POA: Diagnosis not present

## 2013-12-12 DIAGNOSIS — I150 Renovascular hypertension: Secondary | ICD-10-CM | POA: Diagnosis not present

## 2013-12-12 DIAGNOSIS — E119 Type 2 diabetes mellitus without complications: Secondary | ICD-10-CM | POA: Diagnosis not present

## 2013-12-13 DIAGNOSIS — I509 Heart failure, unspecified: Secondary | ICD-10-CM | POA: Diagnosis not present

## 2013-12-13 DIAGNOSIS — R269 Unspecified abnormalities of gait and mobility: Secondary | ICD-10-CM | POA: Diagnosis not present

## 2013-12-13 DIAGNOSIS — N189 Chronic kidney disease, unspecified: Secondary | ICD-10-CM | POA: Diagnosis not present

## 2013-12-13 DIAGNOSIS — I150 Renovascular hypertension: Secondary | ICD-10-CM | POA: Diagnosis not present

## 2013-12-13 DIAGNOSIS — IMO0001 Reserved for inherently not codable concepts without codable children: Secondary | ICD-10-CM | POA: Diagnosis not present

## 2013-12-14 DIAGNOSIS — N186 End stage renal disease: Secondary | ICD-10-CM | POA: Diagnosis not present

## 2013-12-14 DIAGNOSIS — E119 Type 2 diabetes mellitus without complications: Secondary | ICD-10-CM | POA: Diagnosis not present

## 2013-12-16 DIAGNOSIS — N186 End stage renal disease: Secondary | ICD-10-CM | POA: Diagnosis not present

## 2013-12-16 DIAGNOSIS — E119 Type 2 diabetes mellitus without complications: Secondary | ICD-10-CM | POA: Diagnosis not present

## 2013-12-17 DIAGNOSIS — N186 End stage renal disease: Secondary | ICD-10-CM | POA: Diagnosis not present

## 2013-12-19 DIAGNOSIS — T82898A Other specified complication of vascular prosthetic devices, implants and grafts, initial encounter: Secondary | ICD-10-CM | POA: Diagnosis not present

## 2013-12-19 DIAGNOSIS — N186 End stage renal disease: Secondary | ICD-10-CM | POA: Diagnosis not present

## 2013-12-19 DIAGNOSIS — E8779 Other fluid overload: Secondary | ICD-10-CM | POA: Diagnosis not present

## 2013-12-19 DIAGNOSIS — E119 Type 2 diabetes mellitus without complications: Secondary | ICD-10-CM | POA: Diagnosis not present

## 2013-12-19 DIAGNOSIS — Z23 Encounter for immunization: Secondary | ICD-10-CM | POA: Diagnosis not present

## 2013-12-19 DIAGNOSIS — D631 Anemia in chronic kidney disease: Secondary | ICD-10-CM | POA: Diagnosis not present

## 2013-12-21 DIAGNOSIS — N039 Chronic nephritic syndrome with unspecified morphologic changes: Secondary | ICD-10-CM | POA: Diagnosis not present

## 2013-12-21 DIAGNOSIS — E8779 Other fluid overload: Secondary | ICD-10-CM | POA: Diagnosis not present

## 2013-12-21 DIAGNOSIS — D631 Anemia in chronic kidney disease: Secondary | ICD-10-CM | POA: Diagnosis not present

## 2013-12-21 DIAGNOSIS — E119 Type 2 diabetes mellitus without complications: Secondary | ICD-10-CM | POA: Diagnosis not present

## 2013-12-21 DIAGNOSIS — N186 End stage renal disease: Secondary | ICD-10-CM | POA: Diagnosis not present

## 2013-12-21 DIAGNOSIS — Z23 Encounter for immunization: Secondary | ICD-10-CM | POA: Diagnosis not present

## 2013-12-21 DIAGNOSIS — T82898A Other specified complication of vascular prosthetic devices, implants and grafts, initial encounter: Secondary | ICD-10-CM | POA: Diagnosis not present

## 2013-12-23 DIAGNOSIS — T82898A Other specified complication of vascular prosthetic devices, implants and grafts, initial encounter: Secondary | ICD-10-CM | POA: Diagnosis not present

## 2013-12-23 DIAGNOSIS — E8779 Other fluid overload: Secondary | ICD-10-CM | POA: Diagnosis not present

## 2013-12-23 DIAGNOSIS — N186 End stage renal disease: Secondary | ICD-10-CM | POA: Diagnosis not present

## 2013-12-23 DIAGNOSIS — Z23 Encounter for immunization: Secondary | ICD-10-CM | POA: Diagnosis not present

## 2013-12-23 DIAGNOSIS — E119 Type 2 diabetes mellitus without complications: Secondary | ICD-10-CM | POA: Diagnosis not present

## 2013-12-23 DIAGNOSIS — D631 Anemia in chronic kidney disease: Secondary | ICD-10-CM | POA: Diagnosis not present

## 2013-12-23 DIAGNOSIS — N039 Chronic nephritic syndrome with unspecified morphologic changes: Secondary | ICD-10-CM | POA: Diagnosis not present

## 2013-12-26 DIAGNOSIS — D631 Anemia in chronic kidney disease: Secondary | ICD-10-CM | POA: Diagnosis not present

## 2013-12-26 DIAGNOSIS — N186 End stage renal disease: Secondary | ICD-10-CM | POA: Diagnosis not present

## 2013-12-26 DIAGNOSIS — T82898A Other specified complication of vascular prosthetic devices, implants and grafts, initial encounter: Secondary | ICD-10-CM | POA: Diagnosis not present

## 2013-12-26 DIAGNOSIS — E8779 Other fluid overload: Secondary | ICD-10-CM | POA: Diagnosis not present

## 2013-12-26 DIAGNOSIS — Z23 Encounter for immunization: Secondary | ICD-10-CM | POA: Diagnosis not present

## 2013-12-26 DIAGNOSIS — E119 Type 2 diabetes mellitus without complications: Secondary | ICD-10-CM | POA: Diagnosis not present

## 2013-12-26 DIAGNOSIS — N039 Chronic nephritic syndrome with unspecified morphologic changes: Secondary | ICD-10-CM | POA: Diagnosis not present

## 2013-12-27 DIAGNOSIS — D509 Iron deficiency anemia, unspecified: Secondary | ICD-10-CM | POA: Diagnosis not present

## 2013-12-28 DIAGNOSIS — E119 Type 2 diabetes mellitus without complications: Secondary | ICD-10-CM | POA: Diagnosis not present

## 2013-12-28 DIAGNOSIS — D631 Anemia in chronic kidney disease: Secondary | ICD-10-CM | POA: Diagnosis not present

## 2013-12-28 DIAGNOSIS — E8779 Other fluid overload: Secondary | ICD-10-CM | POA: Diagnosis not present

## 2013-12-28 DIAGNOSIS — N186 End stage renal disease: Secondary | ICD-10-CM | POA: Diagnosis not present

## 2013-12-28 DIAGNOSIS — T82898A Other specified complication of vascular prosthetic devices, implants and grafts, initial encounter: Secondary | ICD-10-CM | POA: Diagnosis not present

## 2013-12-28 DIAGNOSIS — Z23 Encounter for immunization: Secondary | ICD-10-CM | POA: Diagnosis not present

## 2013-12-30 DIAGNOSIS — E8779 Other fluid overload: Secondary | ICD-10-CM | POA: Diagnosis not present

## 2013-12-30 DIAGNOSIS — T82898A Other specified complication of vascular prosthetic devices, implants and grafts, initial encounter: Secondary | ICD-10-CM | POA: Diagnosis not present

## 2013-12-30 DIAGNOSIS — E119 Type 2 diabetes mellitus without complications: Secondary | ICD-10-CM | POA: Diagnosis not present

## 2013-12-30 DIAGNOSIS — Z23 Encounter for immunization: Secondary | ICD-10-CM | POA: Diagnosis not present

## 2013-12-30 DIAGNOSIS — N039 Chronic nephritic syndrome with unspecified morphologic changes: Secondary | ICD-10-CM | POA: Diagnosis not present

## 2013-12-30 DIAGNOSIS — N186 End stage renal disease: Secondary | ICD-10-CM | POA: Diagnosis not present

## 2013-12-30 DIAGNOSIS — D631 Anemia in chronic kidney disease: Secondary | ICD-10-CM | POA: Diagnosis not present

## 2014-01-01 DIAGNOSIS — T82898A Other specified complication of vascular prosthetic devices, implants and grafts, initial encounter: Secondary | ICD-10-CM | POA: Diagnosis not present

## 2014-01-01 DIAGNOSIS — N186 End stage renal disease: Secondary | ICD-10-CM | POA: Diagnosis not present

## 2014-01-02 DIAGNOSIS — N186 End stage renal disease: Secondary | ICD-10-CM | POA: Diagnosis not present

## 2014-01-02 DIAGNOSIS — Z23 Encounter for immunization: Secondary | ICD-10-CM | POA: Diagnosis not present

## 2014-01-02 DIAGNOSIS — N039 Chronic nephritic syndrome with unspecified morphologic changes: Secondary | ICD-10-CM | POA: Diagnosis not present

## 2014-01-02 DIAGNOSIS — T82898A Other specified complication of vascular prosthetic devices, implants and grafts, initial encounter: Secondary | ICD-10-CM | POA: Diagnosis not present

## 2014-01-02 DIAGNOSIS — D631 Anemia in chronic kidney disease: Secondary | ICD-10-CM | POA: Diagnosis not present

## 2014-01-02 DIAGNOSIS — E8779 Other fluid overload: Secondary | ICD-10-CM | POA: Diagnosis not present

## 2014-01-02 DIAGNOSIS — E119 Type 2 diabetes mellitus without complications: Secondary | ICD-10-CM | POA: Diagnosis not present

## 2014-01-03 DIAGNOSIS — N186 End stage renal disease: Secondary | ICD-10-CM | POA: Diagnosis not present

## 2014-01-03 DIAGNOSIS — N039 Chronic nephritic syndrome with unspecified morphologic changes: Secondary | ICD-10-CM | POA: Diagnosis not present

## 2014-01-03 DIAGNOSIS — D631 Anemia in chronic kidney disease: Secondary | ICD-10-CM | POA: Diagnosis not present

## 2014-01-03 DIAGNOSIS — T82898A Other specified complication of vascular prosthetic devices, implants and grafts, initial encounter: Secondary | ICD-10-CM | POA: Diagnosis not present

## 2014-01-03 DIAGNOSIS — E119 Type 2 diabetes mellitus without complications: Secondary | ICD-10-CM | POA: Diagnosis not present

## 2014-01-03 DIAGNOSIS — Z23 Encounter for immunization: Secondary | ICD-10-CM | POA: Diagnosis not present

## 2014-01-03 DIAGNOSIS — E8779 Other fluid overload: Secondary | ICD-10-CM | POA: Diagnosis not present

## 2014-01-04 DIAGNOSIS — Z23 Encounter for immunization: Secondary | ICD-10-CM | POA: Diagnosis not present

## 2014-01-04 DIAGNOSIS — E8779 Other fluid overload: Secondary | ICD-10-CM | POA: Diagnosis not present

## 2014-01-04 DIAGNOSIS — T82898A Other specified complication of vascular prosthetic devices, implants and grafts, initial encounter: Secondary | ICD-10-CM | POA: Diagnosis not present

## 2014-01-04 DIAGNOSIS — N186 End stage renal disease: Secondary | ICD-10-CM | POA: Diagnosis not present

## 2014-01-04 DIAGNOSIS — D631 Anemia in chronic kidney disease: Secondary | ICD-10-CM | POA: Diagnosis not present

## 2014-01-04 DIAGNOSIS — E119 Type 2 diabetes mellitus without complications: Secondary | ICD-10-CM | POA: Diagnosis not present

## 2014-01-06 DIAGNOSIS — N039 Chronic nephritic syndrome with unspecified morphologic changes: Secondary | ICD-10-CM | POA: Diagnosis not present

## 2014-01-06 DIAGNOSIS — D631 Anemia in chronic kidney disease: Secondary | ICD-10-CM | POA: Diagnosis not present

## 2014-01-06 DIAGNOSIS — N186 End stage renal disease: Secondary | ICD-10-CM | POA: Diagnosis not present

## 2014-01-06 DIAGNOSIS — E8779 Other fluid overload: Secondary | ICD-10-CM | POA: Diagnosis not present

## 2014-01-06 DIAGNOSIS — E119 Type 2 diabetes mellitus without complications: Secondary | ICD-10-CM | POA: Diagnosis not present

## 2014-01-06 DIAGNOSIS — Z23 Encounter for immunization: Secondary | ICD-10-CM | POA: Diagnosis not present

## 2014-01-06 DIAGNOSIS — T82898A Other specified complication of vascular prosthetic devices, implants and grafts, initial encounter: Secondary | ICD-10-CM | POA: Diagnosis not present

## 2014-01-09 DIAGNOSIS — D631 Anemia in chronic kidney disease: Secondary | ICD-10-CM | POA: Diagnosis not present

## 2014-01-09 DIAGNOSIS — E119 Type 2 diabetes mellitus without complications: Secondary | ICD-10-CM | POA: Diagnosis not present

## 2014-01-09 DIAGNOSIS — E8779 Other fluid overload: Secondary | ICD-10-CM | POA: Diagnosis not present

## 2014-01-09 DIAGNOSIS — T82898A Other specified complication of vascular prosthetic devices, implants and grafts, initial encounter: Secondary | ICD-10-CM | POA: Diagnosis not present

## 2014-01-09 DIAGNOSIS — N186 End stage renal disease: Secondary | ICD-10-CM | POA: Diagnosis not present

## 2014-01-09 DIAGNOSIS — N039 Chronic nephritic syndrome with unspecified morphologic changes: Secondary | ICD-10-CM | POA: Diagnosis not present

## 2014-01-09 DIAGNOSIS — Z23 Encounter for immunization: Secondary | ICD-10-CM | POA: Diagnosis not present

## 2014-01-11 DIAGNOSIS — N186 End stage renal disease: Secondary | ICD-10-CM | POA: Diagnosis not present

## 2014-01-11 DIAGNOSIS — D631 Anemia in chronic kidney disease: Secondary | ICD-10-CM | POA: Diagnosis not present

## 2014-01-11 DIAGNOSIS — E119 Type 2 diabetes mellitus without complications: Secondary | ICD-10-CM | POA: Diagnosis not present

## 2014-01-11 DIAGNOSIS — N039 Chronic nephritic syndrome with unspecified morphologic changes: Secondary | ICD-10-CM | POA: Diagnosis not present

## 2014-01-11 DIAGNOSIS — Z23 Encounter for immunization: Secondary | ICD-10-CM | POA: Diagnosis not present

## 2014-01-11 DIAGNOSIS — E8779 Other fluid overload: Secondary | ICD-10-CM | POA: Diagnosis not present

## 2014-01-11 DIAGNOSIS — T82898A Other specified complication of vascular prosthetic devices, implants and grafts, initial encounter: Secondary | ICD-10-CM | POA: Diagnosis not present

## 2014-01-13 DIAGNOSIS — N186 End stage renal disease: Secondary | ICD-10-CM | POA: Diagnosis not present

## 2014-01-13 DIAGNOSIS — T82898A Other specified complication of vascular prosthetic devices, implants and grafts, initial encounter: Secondary | ICD-10-CM | POA: Diagnosis not present

## 2014-01-13 DIAGNOSIS — E119 Type 2 diabetes mellitus without complications: Secondary | ICD-10-CM | POA: Diagnosis not present

## 2014-01-13 DIAGNOSIS — D631 Anemia in chronic kidney disease: Secondary | ICD-10-CM | POA: Diagnosis not present

## 2014-01-13 DIAGNOSIS — E8779 Other fluid overload: Secondary | ICD-10-CM | POA: Diagnosis not present

## 2014-01-13 DIAGNOSIS — Z23 Encounter for immunization: Secondary | ICD-10-CM | POA: Diagnosis not present

## 2014-01-16 DIAGNOSIS — Z23 Encounter for immunization: Secondary | ICD-10-CM | POA: Diagnosis not present

## 2014-01-16 DIAGNOSIS — D631 Anemia in chronic kidney disease: Secondary | ICD-10-CM | POA: Diagnosis not present

## 2014-01-16 DIAGNOSIS — E8779 Other fluid overload: Secondary | ICD-10-CM | POA: Diagnosis not present

## 2014-01-16 DIAGNOSIS — N186 End stage renal disease: Secondary | ICD-10-CM | POA: Diagnosis not present

## 2014-01-16 DIAGNOSIS — E119 Type 2 diabetes mellitus without complications: Secondary | ICD-10-CM | POA: Diagnosis not present

## 2014-01-16 DIAGNOSIS — T82898A Other specified complication of vascular prosthetic devices, implants and grafts, initial encounter: Secondary | ICD-10-CM | POA: Diagnosis not present

## 2014-01-18 DIAGNOSIS — N186 End stage renal disease: Secondary | ICD-10-CM | POA: Diagnosis not present

## 2014-01-18 DIAGNOSIS — Z23 Encounter for immunization: Secondary | ICD-10-CM | POA: Diagnosis not present

## 2014-01-18 DIAGNOSIS — D509 Iron deficiency anemia, unspecified: Secondary | ICD-10-CM | POA: Diagnosis not present

## 2014-01-18 DIAGNOSIS — D631 Anemia in chronic kidney disease: Secondary | ICD-10-CM | POA: Diagnosis not present

## 2014-01-18 DIAGNOSIS — E119 Type 2 diabetes mellitus without complications: Secondary | ICD-10-CM | POA: Diagnosis not present

## 2014-01-20 DIAGNOSIS — D631 Anemia in chronic kidney disease: Secondary | ICD-10-CM | POA: Diagnosis not present

## 2014-01-20 DIAGNOSIS — N186 End stage renal disease: Secondary | ICD-10-CM | POA: Diagnosis not present

## 2014-01-20 DIAGNOSIS — N039 Chronic nephritic syndrome with unspecified morphologic changes: Secondary | ICD-10-CM | POA: Diagnosis not present

## 2014-01-20 DIAGNOSIS — E119 Type 2 diabetes mellitus without complications: Secondary | ICD-10-CM | POA: Diagnosis not present

## 2014-01-20 DIAGNOSIS — Z23 Encounter for immunization: Secondary | ICD-10-CM | POA: Diagnosis not present

## 2014-01-20 DIAGNOSIS — D509 Iron deficiency anemia, unspecified: Secondary | ICD-10-CM | POA: Diagnosis not present

## 2014-01-23 DIAGNOSIS — N186 End stage renal disease: Secondary | ICD-10-CM | POA: Diagnosis not present

## 2014-01-23 DIAGNOSIS — Z23 Encounter for immunization: Secondary | ICD-10-CM | POA: Diagnosis not present

## 2014-01-23 DIAGNOSIS — E119 Type 2 diabetes mellitus without complications: Secondary | ICD-10-CM | POA: Diagnosis not present

## 2014-01-23 DIAGNOSIS — D631 Anemia in chronic kidney disease: Secondary | ICD-10-CM | POA: Diagnosis not present

## 2014-01-23 DIAGNOSIS — D509 Iron deficiency anemia, unspecified: Secondary | ICD-10-CM | POA: Diagnosis not present

## 2014-01-24 ENCOUNTER — Encounter: Payer: Self-pay | Admitting: Vascular Surgery

## 2014-01-24 ENCOUNTER — Ambulatory Visit (INDEPENDENT_AMBULATORY_CARE_PROVIDER_SITE_OTHER): Payer: Medicare Other | Admitting: Vascular Surgery

## 2014-01-24 ENCOUNTER — Other Ambulatory Visit: Payer: Self-pay

## 2014-01-24 VITALS — BP 127/61 | HR 75 | Ht 68.0 in | Wt 168.0 lb

## 2014-01-24 DIAGNOSIS — N186 End stage renal disease: Secondary | ICD-10-CM

## 2014-01-24 NOTE — Progress Notes (Signed)
Established Dialysis Access  History of Present Illness  Lucas Calderon is a 73 y.o. (02/23/1941) male who presents for re-evaluation of left radiocephalic arteriovenous fistula.  The patient has been started on hemodialysis via a L RC AVF but they are having difficulty with cannulation of the fistula.  This patient was lost to follow up and saw Dr. Oneida Alar, who recommended a fistulogram.  Dr. Florene Glen wanted to defer the fistulogram at that time as the patient's renal function had acutely deteriorated.  By report the patient was able to complete hemodialysis at his last HD session: T (T-R-S)  Past Medical History  Diagnosis Date  . Anemia   . Diabetes mellitus without complication   . Chronic kidney disease   . Pneumonia   . Hypertension   . GERD (gastroesophageal reflux disease)     Past Surgical History  Procedure Laterality Date  . Appendectomy    . Av fistula placement Left 09/11/2013    Procedure:  CREATION- RADIOCEPHALIC arteriovenous fistula;  Surgeon: Conrad Searles, MD;  Location: [place];  Service: Vascular;  Laterality: Left;    History   Social History  . Marital Status: Unknown    Spouse Name: N/A    Number of Children: N/A  . Years of Education: N/A   Occupational History  . Not on file.   Social History Main Topics  . Smoking status: Former Research scientist (life sciences)  . Smokeless tobacco: Not on file  . Alcohol Use: No  . Drug Use: No  . Sexual Activity: Not on file   Other Topics Concern  . Not on file   Social History Narrative  . No narrative on file    Family History  Problem Relation Age of Onset  . Diabetes Mother   . Heart disease Mother   . Hypertension Mother   . Diabetes Father   . Hypertension Father   . Peripheral vascular disease Father   . Hypertension Sister    Current Outpatient Prescriptions on File Prior to Visit  Medication Sig Dispense Refill  . albuterol (PROVENTIL HFA) 108 (90 BASE) MCG/ACT inhaler Inhale 2 puffs into the lungs every 6 (six)  hours as needed for wheezing or shortness of breath.      Marland Kitchen amLODipine (NORVASC) 10 MG tablet Take 10 mg by mouth daily.      Marland Kitchen aspirin 81 MG tablet Take 81 mg by mouth daily.      . B Complex-C-Folic Acid (VITA-BEE/C) TABS Take 1 tablet by mouth daily.      . carvedilol (COREG) 12.5 MG tablet Take 12.5 mg by mouth 2 (two) times daily with a meal.      . ferrous sulfate 325 (65 FE) MG EC tablet Take 325 mg by mouth daily with breakfast.       . furosemide (LASIX) 80 MG tablet Take 1 tablet (80 mg total) by mouth 2 (two) times daily.  60 tablet  0  . hydrALAZINE (APRESOLINE) 50 MG tablet Take 1.5 tablets (75 mg total) by mouth 3 (three) times daily.  120 tablet  0  . isosorbide mononitrate (IMDUR) 60 MG 24 hr tablet Take 60 mg by mouth daily.      . multivitamin (RENA-VIT) TABS tablet Take 1 tablet by mouth at bedtime.  30 tablet  0   No current facility-administered medications on file prior to visit.    Allergies  Allergen Reactions  . Penicillins     Unknown    REVIEW OF SYSTEMS:  (Positives  checked otherwise negative)  CARDIOVASCULAR:  []  chest pain, []  chest pressure, []  palpitations, []  shortness of breath when laying flat, []  shortness of breath with exertion,  []  pain in feet when walking, []  pain in feet when laying flat, []  history of blood clot in veins (DVT), []  history of phlebitis, []  swelling in legs, []  varicose veins  PULMONARY:  []  productive cough, []  asthma, []  wheezing  NEUROLOGIC:  []  weakness in arms or legs, []  numbness in arms or legs, []  difficulty speaking or slurred speech, []  temporary loss of vision in one eye, []  dizziness  HEMATOLOGIC:  []  bleeding problems, []  problems with blood clotting too easily  MUSCULOSKEL:  []  joint pain, []  joint swelling  GASTROINTEST:  []  vomiting blood, []  blood in stool     GENITOURINARY:  []  burning with urination, []  blood in urine, [x]  ESRD: T-R-S  PSYCHIATRIC:  []  history of major depression  INTEGUMENTARY:  []   rashes, []  ulcers  Physical Examination  Filed Vitals:   01/24/14 1404  BP: 127/61  Pulse: 75  Height: 5\' 8"  (1.727 m)  Weight: 168 lb (76.204 kg)  SpO2: 100%   Body mass index is 25.55 kg/(m^2).  General: A&O x 3, WD, WN  Pulmonary: Sym exp, good air movt, CTAB, no rales, rhonchi, & wheezing  Cardiac: RRR, Nl S1, S2, no Murmurs, rubs or gallops  Vascular: Vessel Right Left  Radial Palpable Palpable  Ulnar Not Palpable Not Palpable  Brachial Palpable Palpable   Gastrointestinal: soft, NTND, -G/R, - HSM, - masses, - CVAT B  Musculoskeletal: M/S 5/5 throughout , Extremities without  ischemic changes , + palpable thrill in access in L RC AVF, + bruit in access  Neurologic: Pain and light touch intact in extremities , Motor exam as listed above  Outside Studies/Documentation 5 pages of outside documents were reviewed including: outpatient nephrology chart, report of fistulogram.  Medical Decision Making  Lucas Calderon is a 73 y.o. male who presents with ESRD requiring hemodialysis.    Fistulogram report is inadequate: no details on the actual fistula.  The patient has successfully completed hemodialysis recently further which would not be compatible with the recent fistulogram.    I recommended to the patient we go the hybrid OR and repeat the fistulogram.  Based on the findings, if I can salvage the fistula, I will attempt to due such, otherwise I would plan on placement of another fistula immediately and ligating the L RC AVF.  I had an extensive discussion with this patient in regards to the nature of access surgery, including risk, benefits, and alternatives.    The patient is aware that the risks of access surgery include but are not limited to: bleeding, infection, steal syndrome, nerve damage, ischemic monomelic neuropathy, failure of access to mature, and possible need for additional access procedures in the future.  The patient has agreed to proceed with the above  procedure which will be scheduled 15 JUL 16.  Adele Barthel, MD [facility] Office: [phone] Pager: [phone]  01/24/2014, 2:24 PM

## 2014-01-25 DIAGNOSIS — D631 Anemia in chronic kidney disease: Secondary | ICD-10-CM | POA: Diagnosis not present

## 2014-01-25 DIAGNOSIS — E119 Type 2 diabetes mellitus without complications: Secondary | ICD-10-CM | POA: Diagnosis not present

## 2014-01-25 DIAGNOSIS — D509 Iron deficiency anemia, unspecified: Secondary | ICD-10-CM | POA: Diagnosis not present

## 2014-01-25 DIAGNOSIS — Z23 Encounter for immunization: Secondary | ICD-10-CM | POA: Diagnosis not present

## 2014-01-25 DIAGNOSIS — N186 End stage renal disease: Secondary | ICD-10-CM | POA: Diagnosis not present

## 2014-01-27 DIAGNOSIS — Z23 Encounter for immunization: Secondary | ICD-10-CM | POA: Diagnosis not present

## 2014-01-27 DIAGNOSIS — D509 Iron deficiency anemia, unspecified: Secondary | ICD-10-CM | POA: Diagnosis not present

## 2014-01-27 DIAGNOSIS — E119 Type 2 diabetes mellitus without complications: Secondary | ICD-10-CM | POA: Diagnosis not present

## 2014-01-27 DIAGNOSIS — D631 Anemia in chronic kidney disease: Secondary | ICD-10-CM | POA: Diagnosis not present

## 2014-01-27 DIAGNOSIS — N186 End stage renal disease: Secondary | ICD-10-CM | POA: Diagnosis not present

## 2014-01-30 ENCOUNTER — Encounter (HOSPITAL_COMMUNITY): Payer: Self-pay | Admitting: *Deleted

## 2014-01-30 DIAGNOSIS — E119 Type 2 diabetes mellitus without complications: Secondary | ICD-10-CM | POA: Diagnosis not present

## 2014-01-30 DIAGNOSIS — D631 Anemia in chronic kidney disease: Secondary | ICD-10-CM | POA: Diagnosis not present

## 2014-01-30 DIAGNOSIS — N186 End stage renal disease: Secondary | ICD-10-CM | POA: Diagnosis not present

## 2014-01-30 DIAGNOSIS — N039 Chronic nephritic syndrome with unspecified morphologic changes: Secondary | ICD-10-CM | POA: Diagnosis not present

## 2014-01-30 DIAGNOSIS — Z23 Encounter for immunization: Secondary | ICD-10-CM | POA: Diagnosis not present

## 2014-01-30 DIAGNOSIS — D509 Iron deficiency anemia, unspecified: Secondary | ICD-10-CM | POA: Diagnosis not present

## 2014-01-30 MED ORDER — VANCOMYCIN HCL IN DEXTROSE 1-5 GM/200ML-% IV SOLN
1000.0000 mg | INTRAVENOUS | Status: AC
  Administered 2014-01-31: 1000 mg via INTRAVENOUS
  Filled 2014-01-30: qty 200

## 2014-01-30 MED ORDER — SODIUM CHLORIDE 0.9 % IV SOLN
INTRAVENOUS | Status: DC
  Administered 2014-01-31 (×2): via INTRAVENOUS

## 2014-01-30 NOTE — Progress Notes (Signed)
01/30/14 [place]  Have you ever been diagnosed with sleep apnea through a sleep study? No  Do you snore loudly (loud enough to be heard through closed doors)?  0  Do you often feel tired, fatigued, or sleepy during the daytime? 0  Has anyone observed you stop breathing during your sleep? 0  Do you have, or are you being treated for high blood pressure? 1  BMI more than 35 kg/m2? 0  Age over 71 years old? 1  Neck circumference greater than 40 cm/16 inches? 1  Gender: 1  Obstructive Sleep Apnea Score 4  Score 4 or greater  Results sent to PCP

## 2014-01-31 ENCOUNTER — Encounter (HOSPITAL_COMMUNITY): Payer: Self-pay | Admitting: Anesthesiology

## 2014-01-31 ENCOUNTER — Encounter (HOSPITAL_COMMUNITY): Payer: Medicare Other | Admitting: Anesthesiology

## 2014-01-31 ENCOUNTER — Telehealth: Payer: Self-pay | Admitting: Vascular Surgery

## 2014-01-31 ENCOUNTER — Encounter (HOSPITAL_COMMUNITY)
Admission: RE | Disposition: A | Discharge status: Home / Self Care | Payer: Self-pay | Source: Ambulatory Visit | Attending: Vascular Surgery

## 2014-01-31 ENCOUNTER — Ambulatory Visit (HOSPITAL_COMMUNITY)
Admission: RE | Admit: 2014-01-31 | Discharge: 2014-01-31 | Disposition: A | Discharge status: Home / Self Care | Payer: Medicare Other | Source: Ambulatory Visit | Attending: Vascular Surgery | Admitting: Vascular Surgery

## 2014-01-31 ENCOUNTER — Ambulatory Visit (HOSPITAL_COMMUNITY): Payer: Medicare Other | Admitting: Anesthesiology

## 2014-01-31 DIAGNOSIS — Z87891 Personal history of nicotine dependence: Secondary | ICD-10-CM | POA: Diagnosis not present

## 2014-01-31 DIAGNOSIS — T82898A Other specified complication of vascular prosthetic devices, implants and grafts, initial encounter: Secondary | ICD-10-CM | POA: Diagnosis not present

## 2014-01-31 DIAGNOSIS — Z992 Dependence on renal dialysis: Secondary | ICD-10-CM | POA: Insufficient documentation

## 2014-01-31 DIAGNOSIS — I120 Hypertensive chronic kidney disease with stage 5 chronic kidney disease or end stage renal disease: Secondary | ICD-10-CM | POA: Insufficient documentation

## 2014-01-31 DIAGNOSIS — R0602 Shortness of breath: Secondary | ICD-10-CM | POA: Diagnosis not present

## 2014-01-31 DIAGNOSIS — E119 Type 2 diabetes mellitus without complications: Secondary | ICD-10-CM | POA: Insufficient documentation

## 2014-01-31 DIAGNOSIS — Z7982 Long term (current) use of aspirin: Secondary | ICD-10-CM | POA: Diagnosis not present

## 2014-01-31 DIAGNOSIS — K219 Gastro-esophageal reflux disease without esophagitis: Secondary | ICD-10-CM | POA: Insufficient documentation

## 2014-01-31 DIAGNOSIS — Z8701 Personal history of pneumonia (recurrent): Secondary | ICD-10-CM | POA: Diagnosis not present

## 2014-01-31 DIAGNOSIS — N186 End stage renal disease: Secondary | ICD-10-CM | POA: Diagnosis not present

## 2014-01-31 DIAGNOSIS — Y832 Surgical operation with anastomosis, bypass or graft as the cause of abnormal reaction of the patient, or of later complication, without mention of misadventure at the time of the procedure: Secondary | ICD-10-CM | POA: Insufficient documentation

## 2014-01-31 DIAGNOSIS — Z79899 Other long term (current) drug therapy: Secondary | ICD-10-CM | POA: Diagnosis not present

## 2014-01-31 HISTORY — PX: FISTULOGRAM: SHX5832

## 2014-01-31 HISTORY — DX: Shortness of breath: R06.02

## 2014-01-31 HISTORY — DX: Malignant (primary) neoplasm, unspecified: C80.1

## 2014-01-31 HISTORY — PX: LIGATION OF COMPETING BRANCHES OF ARTERIOVENOUS FISTULA: SHX5949

## 2014-01-31 LAB — GLUCOSE, CAPILLARY
GLUCOSE-CAPILLARY: 179 mg/dL — AB (ref 70–99)
Glucose-Capillary: 139 mg/dL — ABNORMAL HIGH (ref 70–99)

## 2014-01-31 LAB — POCT I-STAT 4, (NA,K, GLUC, HGB,HCT)
Glucose, Bld: 166 mg/dL — ABNORMAL HIGH (ref 70–99)
HCT: 31 % — ABNORMAL LOW (ref 39.0–52.0)
Hemoglobin: 10.5 g/dL — ABNORMAL LOW (ref 13.0–17.0)
Potassium: 3.9 mEq/L (ref 3.7–5.3)
SODIUM: 138 meq/L (ref 137–147)

## 2014-01-31 SURGERY — FISTULOGRAM
Anesthesia: Monitor Anesthesia Care | Site: Arm Lower | Laterality: Left

## 2014-01-31 MED ORDER — PHENYLEPHRINE HCL 10 MG/ML IJ SOLN
INTRAMUSCULAR | Status: DC | PRN
  Administered 2014-01-31: 80 ug via INTRAVENOUS

## 2014-01-31 MED ORDER — PHENYLEPHRINE 40 MCG/ML (10ML) SYRINGE FOR IV PUSH (FOR BLOOD PRESSURE SUPPORT)
PREFILLED_SYRINGE | INTRAVENOUS | Status: AC
  Filled 2014-01-31: qty 10

## 2014-01-31 MED ORDER — FENTANYL CITRATE 0.05 MG/ML IJ SOLN
INTRAMUSCULAR | Status: AC
  Filled 2014-01-31: qty 5

## 2014-01-31 MED ORDER — IOHEXOL 300 MG/ML  SOLN
INTRAMUSCULAR | Status: DC | PRN
  Administered 2014-01-31: 30 mL via INTRAVENOUS

## 2014-01-31 MED ORDER — EPHEDRINE SULFATE 50 MG/ML IJ SOLN
INTRAMUSCULAR | Status: DC | PRN
  Administered 2014-01-31: 10 mg via INTRAVENOUS

## 2014-01-31 MED ORDER — ONDANSETRON HCL 4 MG/2ML IJ SOLN
INTRAMUSCULAR | Status: AC
  Filled 2014-01-31: qty 2

## 2014-01-31 MED ORDER — NEOSTIGMINE METHYLSULFATE 10 MG/10ML IV SOLN
INTRAVENOUS | Status: AC
  Filled 2014-01-31: qty 1

## 2014-01-31 MED ORDER — CHLORHEXIDINE GLUCONATE CLOTH 2 % EX PADS: 6.0000 | MEDICATED_PAD | Freq: Once | CUTANEOUS | Status: DC

## 2014-01-31 MED ORDER — SODIUM CHLORIDE 0.9 % IV SOLN: INTRAVENOUS | Status: DC

## 2014-01-31 MED ORDER — HYDROMORPHONE HCL PF 1 MG/ML IJ SOLN: 0.2500 mg | INTRAMUSCULAR | Status: DC | PRN

## 2014-01-31 MED ORDER — THROMBIN 20000 UNITS EX SOLR
CUTANEOUS | Status: AC
  Filled 2014-01-31: qty 20000

## 2014-01-31 MED ORDER — GLYCOPYRROLATE 0.2 MG/ML IJ SOLN
INTRAMUSCULAR | Status: AC
  Filled 2014-01-31: qty 2

## 2014-01-31 MED ORDER — LIDOCAINE HCL (CARDIAC) 20 MG/ML IV SOLN
INTRAVENOUS | Status: AC
  Filled 2014-01-31: qty 5

## 2014-01-31 MED ORDER — PROPOFOL 10 MG/ML IV BOLUS
INTRAVENOUS | Status: DC | PRN
  Administered 2014-01-31: 160 mg via INTRAVENOUS

## 2014-01-31 MED ORDER — FENTANYL CITRATE 0.05 MG/ML IJ SOLN
INTRAMUSCULAR | Status: DC | PRN
  Administered 2014-01-31: 50 ug via INTRAVENOUS

## 2014-01-31 MED ORDER — ROCURONIUM BROMIDE 50 MG/5ML IV SOLN
INTRAVENOUS | Status: AC
  Filled 2014-01-31: qty 1

## 2014-01-31 MED ORDER — MIDAZOLAM HCL 2 MG/2ML IJ SOLN
INTRAMUSCULAR | Status: AC
  Filled 2014-01-31: qty 2

## 2014-01-31 MED ORDER — OXYCODONE-ACETAMINOPHEN 5-325 MG PO TABS: 1.0000 | ORAL_TABLET | Freq: Four times a day (QID) | ORAL | Status: DC | PRN

## 2014-01-31 MED ORDER — ONDANSETRON HCL 4 MG/2ML IJ SOLN: 4.0000 mg | Freq: Once | INTRAMUSCULAR | Status: DC | PRN

## 2014-01-31 MED ORDER — HEPARIN SODIUM (PORCINE) 5000 UNIT/ML IJ SOLN
INTRAMUSCULAR | Status: DC | PRN
  Administered 2014-01-31: 12:00:00

## 2014-01-31 MED ORDER — 0.9 % SODIUM CHLORIDE (POUR BTL) OPTIME
TOPICAL | Status: DC | PRN
  Administered 2014-01-31: 1000 mL

## 2014-01-31 MED ORDER — ONDANSETRON HCL 4 MG/2ML IJ SOLN
INTRAMUSCULAR | Status: DC | PRN
  Administered 2014-01-31: 4 mg via INTRAVENOUS

## 2014-01-31 MED ORDER — PROPOFOL 10 MG/ML IV BOLUS
INTRAVENOUS | Status: AC
  Filled 2014-01-31: qty 20

## 2014-01-31 SURGICAL SUPPLY — 52 items
BLADE 10 SAFETY STRL DISP (BLADE) ×3 IMPLANT
CANISTER SUCTION 2500CC (MISCELLANEOUS) ×3 IMPLANT
CLIP TI MEDIUM 24 (CLIP) ×3 IMPLANT
CLIP TI WIDE RED SMALL 24 (CLIP) ×3 IMPLANT
COVER PROBE W GEL 5X96 (DRAPES) ×3 IMPLANT
COVER SURGICAL LIGHT HANDLE (MISCELLANEOUS) ×3 IMPLANT
CUFF TOURNIQUET SINGLE 18IN (TOURNIQUET CUFF) ×3 IMPLANT
DERMABOND ADVANCED (GAUZE/BANDAGES/DRESSINGS) ×2
DERMABOND ADVANCED .7 DNX12 (GAUZE/BANDAGES/DRESSINGS) ×1 IMPLANT
ELECT REM PT RETURN 9FT ADLT (ELECTROSURGICAL) ×3
ELECTRODE REM PT RTRN 9FT ADLT (ELECTROSURGICAL) ×1 IMPLANT
GEL ULTRASOUND 20GR AQUASONIC (MISCELLANEOUS) ×3 IMPLANT
GLOVE BIO SURGEON STRL SZ 6.5 (GLOVE) ×2 IMPLANT
GLOVE BIO SURGEON STRL SZ7 (GLOVE) ×3 IMPLANT
GLOVE BIO SURGEONS STRL SZ 6.5 (GLOVE) ×1
GLOVE BIOGEL PI IND STRL 6.5 (GLOVE) ×2 IMPLANT
GLOVE BIOGEL PI IND STRL 7.5 (GLOVE) ×1 IMPLANT
GLOVE BIOGEL PI INDICATOR 6.5 (GLOVE) ×4
GLOVE BIOGEL PI INDICATOR 7.5 (GLOVE) ×2
GLOVE SURG SS PI 6.5 STRL IVOR (GLOVE) ×3 IMPLANT
GOWN STRL REUS W/ TWL LRG LVL3 (GOWN DISPOSABLE) ×3 IMPLANT
GOWN STRL REUS W/TWL LRG LVL3 (GOWN DISPOSABLE) ×6
INTRODUCER COOK 11FR (CATHETERS) IMPLANT
INTRODUCER SET COOK 14FR (MISCELLANEOUS) IMPLANT
KIT BASIN OR (CUSTOM PROCEDURE TRAY) ×3 IMPLANT
KIT ENCORE 26 ADVANTAGE (KITS) IMPLANT
KIT ROOM TURNOVER OR (KITS) ×3 IMPLANT
NEEDLE PERC 18GX7CM (NEEDLE) ×3 IMPLANT
NS IRRIG 1000ML POUR BTL (IV SOLUTION) ×3 IMPLANT
PACK CV ACCESS (CUSTOM PROCEDURE TRAY) ×3 IMPLANT
PAD ARMBOARD 7.5X6 YLW CONV (MISCELLANEOUS) ×6 IMPLANT
PROBE PENCIL 8 MHZ STRL DISP (MISCELLANEOUS) ×3 IMPLANT
SET INTRODUCER 12FR PACEMAKER (SHEATH) IMPLANT
SET MICROPUNCTURE 5F STIFF (MISCELLANEOUS) ×6 IMPLANT
SPONGE GAUZE 4X4 12PLY (GAUZE/BANDAGES/DRESSINGS) ×3 IMPLANT
SPONGE SURGIFOAM ABS GEL 100 (HEMOSTASIS) IMPLANT
STOPCOCK 4 WAY LG BORE MALE ST (IV SETS) ×3 IMPLANT
SUT ETHILON 3 0 PS 1 (SUTURE) IMPLANT
SUT MNCRL AB 4-0 PS2 18 (SUTURE) ×6 IMPLANT
SUT PROLENE 6 0 BV (SUTURE) ×6 IMPLANT
SUT PROLENE 7 0 BV 1 (SUTURE) ×3 IMPLANT
SUT SILK 0 TIES 10X30 (SUTURE) ×3 IMPLANT
SUT SILK 2 0 FS (SUTURE) IMPLANT
SUT VIC AB 3-0 SH 27 (SUTURE) ×2
SUT VIC AB 3-0 SH 27X BRD (SUTURE) ×1 IMPLANT
SYR 20CC LL (SYRINGE) ×3 IMPLANT
TOWEL OR 17X24 6PK STRL BLUE (TOWEL DISPOSABLE) ×3 IMPLANT
TOWEL OR 17X26 10 PK STRL BLUE (TOWEL DISPOSABLE) ×3 IMPLANT
TUBING EXTENTION W/L.L. (IV SETS) ×3 IMPLANT
UNDERPAD 30X30 INCONTINENT (UNDERPADS AND DIAPERS) ×3 IMPLANT
WATER STERILE IRR 1000ML POUR (IV SOLUTION) ×3 IMPLANT
WIRE BENTSON .035X145CM (WIRE) ×3 IMPLANT

## 2014-01-31 NOTE — Transfer of Care (Signed)
Immediate Anesthesia Transfer of Care Note  Patient: Lucas Calderon  Procedure(s) Performed: Procedure(s): FISTULOGRAM (Left) REVISION OF ARTERIOVENOUS FISTULA (Left)  Patient Location: PACU  Anesthesia Type:General  Level of Consciousness: awake, alert , oriented and patient cooperative  Airway & Oxygen Therapy: Patient Spontanous Breathing and Patient connected to nasal cannula oxygen  Post-op Assessment: Report given to PACU RN, Post -op Vital signs reviewed and stable and Patient moving all extremities  Post vital signs: Reviewed and stable  Complications: No apparent anesthesia complications

## 2014-01-31 NOTE — Discharge Instructions (Signed)
° ° °  01/31/2014 Lucas Calderon [id number] February 06, 1941  Surgeon(s): Conrad Willits, MD  Procedure(s): FISTULOGRAM REVISION OF ARTERIOVENOUS FISTULA  x May stick graft on designated area only: above incision cite proximally

## 2014-01-31 NOTE — Telephone Encounter (Signed)
Phone # is disconnected, mailed pt appt letter, dpm

## 2014-01-31 NOTE — Anesthesia Postprocedure Evaluation (Signed)
  Anesthesia Post-op Note  Patient: Lucas Calderon  Procedure(s) Performed: Procedure(s): FISTULOGRAM (Left) REVISION OF ARTERIOVENOUS FISTULA (Left)  Patient Location: PACU  Anesthesia Type:General  Level of Consciousness: awake, alert , oriented and patient cooperative  Airway and Oxygen Therapy: Patient Spontanous Breathing  Post-op Pain: mild  Post-op Assessment: Post-op Vital signs reviewed, Patient's Cardiovascular Status Stable, Respiratory Function Stable, Patent Airway, No signs of Nausea or vomiting and Pain level controlled  Post-op Vital Signs: stable  Last Vitals:  Filed Vitals:   01/31/14 1530  BP: 134/52  Pulse: 60  Temp: 36.2 C  Resp:     Complications: No apparent anesthesia complications

## 2014-01-31 NOTE — Interval H&P Note (Signed)
[facility]  History and Physical Update  The patient was interviewed and re-examined.  The patient's previous History and Physical has been reviewed and is unchanged from my consult.  Plan on left arm fistulogram, possible side branch ligation vs fistula ligation and placement of new fistula.  Adele Barthel, MD [facility] [place] Office: [phone] Pager: [phone]  01/31/2014, 8:51 AM

## 2014-01-31 NOTE — Op Note (Signed)
OPERATIVE NOTE   PROCEDURE: 1. left radiocephalic arteriovenous fistula cannulation under ultrasound guidance 2. left arm fistulogram Revision of left radiocephalic arteriovenous fistula anastomosis  PRE-OPERATIVE DIAGNOSIS: Malfunctioning left radiocephalic arteriovenous fistula  POST-OPERATIVE DIAGNOSIS: same as above   SURGEON: Adele Barthel, MD  ANESTHESIA: local  ESTIMATED BLOOD LOSS: 5 cc  FINDING(S): 1. Widely patent central venous system 2. Occluded of cephalic vein at antecubitum with large collateral redirecting flow into brachial and basilic vein system 3. Most of fistula 6-7 mm in diameter except distal 2 cm which has severe stenosis  4. Radial artery only 2.5 mm in diameter without obvious disease 5. Palpable thrill and radial pulse at end of case  SPECIMEN(S):  None  CONTRAST: 50 cc  INDICATIONS: Lucas Calderon is a 73 y.o. male who presents with malfunctioning left radiocephalic arteriovenous fistula which is difficult to cannulated with repeated episodes of clotting.  The patient is scheduled for left arm fistulogram, possible revision of the fistula as indicated by the fistulogram.  The patient is aware the risks include but are not limited to: bleeding, infection, thrombosis of the cannulated access, and possible anaphylactic reaction to the contrast.  Risk, benefits, and alternatives to access surgery were discussed.  The patient is aware the risks include but are not limited to: bleeding, infection, steal syndrome, nerve damage, ischemic monomelic neuropathy, failure to mature, need for additional procedures, death and stroke.  The patient is aware of the risks of the procedure and elects to proceed forward.  DESCRIPTION: After full informed written consent was obtained, the patient was brought back to the angiography suite and placed supine upon the angiography table.  The patient was connected to monitoring equipment.  The left arm was prepped and draped in the  standard fashion for a left arm fistulogram.  Under ultrasound guidance, the left radiocephalic arteriovenous fistula was cannulated with a micropuncture needle.  The microwire was advanced into the fistula and the needle was exchanged for the a microsheath, which was lodged 2 cm into the access.  The wire was removed and the sheath was connected to the IV extension tubing.  Hand injections were completed to image the access from the antecubitum up to the level of axilla.  The central venous structures were also imaged by hand injections.  Based on the images, this patient will need: revision of the anastomosis.    I made an incision over the fistula from the cannulation site to 2 cm proximal to the prior incision.  Using electrocautery, I dissected out the left radiocephalic arteriovenous fistula.  I clamped the fistula proximally and distal and pulled out the sheath.  I repaired the puncture in the fistula with a figure of eight stitch of 6-0 Prolene.  I dissected out the fistula until the distal 2 cm of the fistula.  I then dissected out the radial artery which had a good pulse.  I clamped the radial artery proximally and distally.  I tied off the distal fistula and then transected the distal fistula.  I adjusted the length of the fistula to facilitate a new anastomosis. I was able to pass a 5 mm dilator through the residual fistula.  I made an arteriotomy in the radial artery with a 11 blade and extended it with a Potts scissor.  I spatulated the fistula to meet the geometry of the arteriotomy.  I sewed the fistula to the radial artery with a running stitch of 7-0 Prolene.  Prior to completing this anastomosis, I  backbled both ends of the radial artery.  There was no thrombus present.  I completed the anastomosis in the usual fashion.  All clamps were removed.  Immediately there was an improved thrill in this fistula.  Distally, there was a noticeable residual segment of fistula pulsating.  I made a stab  incision over the this residual segment and dissected out the vein immediately adjacent to the radial artery.  I placed 2-0 silk ties around the residual distal fistula and ligated and transected the residual fistula.  I washed out all incisions and no residual bleeding was present.  I reapproximated the subcutaneous tissue in both incisions with running stitches of 3-0 Vicryl.  The incisions were closed with running subcutaneous 4-0 Monocryl.  The skin was cleaned, dried, and reinforced with Dermabond.  Distally there was a palpable radial pulse and palpable thrill.  COMPLICATIONS: none  CONDITION: stable  Adele Barthel, MD [facility] [place] Office: [phone] Pager: [phone]  01/31/2014 2:23 PM

## 2014-01-31 NOTE — Telephone Encounter (Signed)
Message copied by Gena Fray on Wed Jan 31, 2014  4:38 PM ------      Message from: Peter Minium K      Created: Wed Jan 31, 2014  4:13 PM      Regarding: Schedule                   ----- Message -----         From: Alvia Grove, PA-C         Sent: 01/31/2014   2:40 PM           To: [facility] Charge Pool            S/p revision of left radiocephalic AVF [id number]            F/u in 4 weeks with Dr. Bridgett Larsson.            Thanks,       Maudie Mercury ------

## 2014-01-31 NOTE — H&P (View-Only) (Signed)
Established Dialysis Access  History of Present Illness  Lucas Calderon is a 73 y.o. (03/01/1941) male who presents for re-evaluation of left radiocephalic arteriovenous fistula.  The patient has been started on hemodialysis via a L RC AVF but they are having difficulty with cannulation of the fistula.  This patient was lost to follow up and saw Dr. Oneida Alar, who recommended a fistulogram.  Dr. Florene Glen wanted to defer the fistulogram at that time as the patient's renal function had acutely deteriorated.  By report the patient was able to complete hemodialysis at his last HD session: T (T-R-S)  Past Medical History  Diagnosis Date  . Anemia   . Diabetes mellitus without complication   . Chronic kidney disease   . Pneumonia   . Hypertension   . GERD (gastroesophageal reflux disease)     Past Surgical History  Procedure Laterality Date  . Appendectomy    . Av fistula placement Left 09/11/2013    Procedure:  CREATION- RADIOCEPHALIC arteriovenous fistula;  Surgeon: Conrad Morton, MD;  Location: [place];  Service: Vascular;  Laterality: Left;    History   Social History  . Marital Status: Unknown    Spouse Name: N/A    Number of Children: N/A  . Years of Education: N/A   Occupational History  . Not on file.   Social History Main Topics  . Smoking status: Former Research scientist (life sciences)  . Smokeless tobacco: Not on file  . Alcohol Use: No  . Drug Use: No  . Sexual Activity: Not on file   Other Topics Concern  . Not on file   Social History Narrative  . No narrative on file    Family History  Problem Relation Age of Onset  . Diabetes Mother   . Heart disease Mother   . Hypertension Mother   . Diabetes Father   . Hypertension Father   . Peripheral vascular disease Father   . Hypertension Sister    Current Outpatient Prescriptions on File Prior to Visit  Medication Sig Dispense Refill  . albuterol (PROVENTIL HFA) 108 (90 BASE) MCG/ACT inhaler Inhale 2 puffs into the lungs every 6 (six)  hours as needed for wheezing or shortness of breath.      Marland Kitchen amLODipine (NORVASC) 10 MG tablet Take 10 mg by mouth daily.      Marland Kitchen aspirin 81 MG tablet Take 81 mg by mouth daily.      . B Complex-C-Folic Acid (VITA-BEE/C) TABS Take 1 tablet by mouth daily.      . carvedilol (COREG) 12.5 MG tablet Take 12.5 mg by mouth 2 (two) times daily with a meal.      . ferrous sulfate 325 (65 FE) MG EC tablet Take 325 mg by mouth daily with breakfast.       . furosemide (LASIX) 80 MG tablet Take 1 tablet (80 mg total) by mouth 2 (two) times daily.  60 tablet  0  . hydrALAZINE (APRESOLINE) 50 MG tablet Take 1.5 tablets (75 mg total) by mouth 3 (three) times daily.  120 tablet  0  . isosorbide mononitrate (IMDUR) 60 MG 24 hr tablet Take 60 mg by mouth daily.      . multivitamin (RENA-VIT) TABS tablet Take 1 tablet by mouth at bedtime.  30 tablet  0   No current facility-administered medications on file prior to visit.    Allergies  Allergen Reactions  . Penicillins     Unknown    REVIEW OF SYSTEMS:  (Positives  checked otherwise negative)  CARDIOVASCULAR:  []  chest pain, []  chest pressure, []  palpitations, []  shortness of breath when laying flat, []  shortness of breath with exertion,  []  pain in feet when walking, []  pain in feet when laying flat, []  history of blood clot in veins (DVT), []  history of phlebitis, []  swelling in legs, []  varicose veins  PULMONARY:  []  productive cough, []  asthma, []  wheezing  NEUROLOGIC:  []  weakness in arms or legs, []  numbness in arms or legs, []  difficulty speaking or slurred speech, []  temporary loss of vision in one eye, []  dizziness  HEMATOLOGIC:  []  bleeding problems, []  problems with blood clotting too easily  MUSCULOSKEL:  []  joint pain, []  joint swelling  GASTROINTEST:  []  vomiting blood, []  blood in stool     GENITOURINARY:  []  burning with urination, []  blood in urine, [x]  ESRD: T-R-S  PSYCHIATRIC:  []  history of major depression  INTEGUMENTARY:  []   rashes, []  ulcers  Physical Examination  Filed Vitals:   01/24/14 1404  BP: 127/61  Pulse: 75  Height: 5\' 8"  (1.727 m)  Weight: 168 lb (76.204 kg)  SpO2: 100%   Body mass index is 25.55 kg/(m^2).  General: A&O x 3, WD, WN  Pulmonary: Sym exp, good air movt, CTAB, no rales, rhonchi, & wheezing  Cardiac: RRR, Nl S1, S2, no Murmurs, rubs or gallops  Vascular: Vessel Right Left  Radial Palpable Palpable  Ulnar Not Palpable Not Palpable  Brachial Palpable Palpable   Gastrointestinal: soft, NTND, -G/R, - HSM, - masses, - CVAT B  Musculoskeletal: M/S 5/5 throughout , Extremities without  ischemic changes , + palpable thrill in access in L RC AVF, + bruit in access  Neurologic: Pain and light touch intact in extremities , Motor exam as listed above  Outside Studies/Documentation 5 pages of outside documents were reviewed including: outpatient nephrology chart, report of fistulogram.  Medical Decision Making  Lucas Calderon is a 73 y.o. male who presents with ESRD requiring hemodialysis.    Fistulogram report is inadequate: no details on the actual fistula.  The patient has successfully completed hemodialysis recently further which would not be compatible with the recent fistulogram.    I recommended to the patient we go the hybrid OR and repeat the fistulogram.  Based on the findings, if I can salvage the fistula, I will attempt to due such, otherwise I would plan on placement of another fistula immediately and ligating the L RC AVF.  I had an extensive discussion with this patient in regards to the nature of access surgery, including risk, benefits, and alternatives.    The patient is aware that the risks of access surgery include but are not limited to: bleeding, infection, steal syndrome, nerve damage, ischemic monomelic neuropathy, failure of access to mature, and possible need for additional access procedures in the future.  The patient has agreed to proceed with the above  procedure which will be scheduled 15 JUL 16.  Adele Barthel, MD [facility] [place] Office: [phone] Pager: [phone]  01/24/2014, 2:24 PM

## 2014-01-31 NOTE — Anesthesia Procedure Notes (Signed)
Procedure Name: LMA Insertion Date/Time: 01/31/2014 12:10 PM Performed by: Julian Reil Pre-anesthesia Checklist: Patient identified, Emergency Drugs available, Suction available, Patient being monitored and Timeout performed Patient Re-evaluated:Patient Re-evaluated prior to inductionOxygen Delivery Method: Circle system utilized Preoxygenation: Pre-oxygenation with 100% oxygen Intubation Type: IV induction Ventilation: Mask ventilation without difficulty LMA: LMA inserted LMA Size: 5.0 Number of attempts: 1 Placement Confirmation: positive ETCO2 and breath sounds checked- equal and bilateral Tube secured with: Tape Dental Injury: Teeth and Oropharynx as per pre-operative assessment

## 2014-01-31 NOTE — Anesthesia Preprocedure Evaluation (Addendum)
Anesthesia Evaluation  Patient identified by MRN, date of birth, ID band Patient awake    Reviewed: Allergy & Precautions, H&P , NPO status , Patient's Chart, lab work & pertinent test results  Airway       Dental   Pulmonary former smoker,          Cardiovascular hypertension,     Neuro/Psych    GI/Hepatic GERD-  ,  Endo/Other  diabetes, Type 2  Renal/GU Dialysis and ESRFRenal disease     Musculoskeletal   Abdominal   Peds  Hematology  (+) anemia ,   Anesthesia Other Findings   Reproductive/Obstetrics                          Anesthesia Physical Anesthesia Plan  ASA: III  Anesthesia Plan: MAC   Post-op Pain Management:    Induction: Intravenous  Airway Management Planned: Mask  Additional Equipment:   Intra-op Plan:   Post-operative Plan:   Informed Consent: I have reviewed the patients History and Physical, chart, labs and discussed the procedure including the risks, benefits and alternatives for the proposed anesthesia with the patient or authorized representative who has indicated his/her understanding and acceptance.     Plan Discussed with:   Anesthesia Plan Comments:         Anesthesia Quick Evaluation

## 2014-02-01 ENCOUNTER — Encounter (HOSPITAL_COMMUNITY): Payer: Self-pay

## 2014-02-01 ENCOUNTER — Ambulatory Visit (HOSPITAL_COMMUNITY): Admit: 2014-02-01 | Payer: Self-pay | Admitting: Vascular Surgery

## 2014-02-01 DIAGNOSIS — D631 Anemia in chronic kidney disease: Secondary | ICD-10-CM | POA: Diagnosis not present

## 2014-02-01 DIAGNOSIS — D509 Iron deficiency anemia, unspecified: Secondary | ICD-10-CM | POA: Diagnosis not present

## 2014-02-01 DIAGNOSIS — E119 Type 2 diabetes mellitus without complications: Secondary | ICD-10-CM | POA: Diagnosis not present

## 2014-02-01 DIAGNOSIS — Z23 Encounter for immunization: Secondary | ICD-10-CM | POA: Diagnosis not present

## 2014-02-01 DIAGNOSIS — N186 End stage renal disease: Secondary | ICD-10-CM | POA: Diagnosis not present

## 2014-02-01 SURGERY — ASSESSMENT, SHUNT FUNCTION, WITH CONTRAST RADIOGRAPHIC STUDY
Anesthesia: LOCAL | Laterality: Left

## 2014-02-02 ENCOUNTER — Encounter (HOSPITAL_COMMUNITY): Payer: Self-pay | Admitting: Vascular Surgery

## 2014-02-03 DIAGNOSIS — Z23 Encounter for immunization: Secondary | ICD-10-CM | POA: Diagnosis not present

## 2014-02-03 DIAGNOSIS — E119 Type 2 diabetes mellitus without complications: Secondary | ICD-10-CM | POA: Diagnosis not present

## 2014-02-03 DIAGNOSIS — D631 Anemia in chronic kidney disease: Secondary | ICD-10-CM | POA: Diagnosis not present

## 2014-02-03 DIAGNOSIS — N186 End stage renal disease: Secondary | ICD-10-CM | POA: Diagnosis not present

## 2014-02-03 DIAGNOSIS — D509 Iron deficiency anemia, unspecified: Secondary | ICD-10-CM | POA: Diagnosis not present

## 2014-02-06 DIAGNOSIS — E119 Type 2 diabetes mellitus without complications: Secondary | ICD-10-CM | POA: Diagnosis not present

## 2014-02-06 DIAGNOSIS — D509 Iron deficiency anemia, unspecified: Secondary | ICD-10-CM | POA: Diagnosis not present

## 2014-02-06 DIAGNOSIS — D631 Anemia in chronic kidney disease: Secondary | ICD-10-CM | POA: Diagnosis not present

## 2014-02-06 DIAGNOSIS — Z23 Encounter for immunization: Secondary | ICD-10-CM | POA: Diagnosis not present

## 2014-02-06 DIAGNOSIS — N186 End stage renal disease: Secondary | ICD-10-CM | POA: Diagnosis not present

## 2014-02-08 DIAGNOSIS — D631 Anemia in chronic kidney disease: Secondary | ICD-10-CM | POA: Diagnosis not present

## 2014-02-08 DIAGNOSIS — E119 Type 2 diabetes mellitus without complications: Secondary | ICD-10-CM | POA: Diagnosis not present

## 2014-02-08 DIAGNOSIS — Z23 Encounter for immunization: Secondary | ICD-10-CM | POA: Diagnosis not present

## 2014-02-08 DIAGNOSIS — D509 Iron deficiency anemia, unspecified: Secondary | ICD-10-CM | POA: Diagnosis not present

## 2014-02-08 DIAGNOSIS — N186 End stage renal disease: Secondary | ICD-10-CM | POA: Diagnosis not present

## 2014-02-10 DIAGNOSIS — Z23 Encounter for immunization: Secondary | ICD-10-CM | POA: Diagnosis not present

## 2014-02-10 DIAGNOSIS — N186 End stage renal disease: Secondary | ICD-10-CM | POA: Diagnosis not present

## 2014-02-10 DIAGNOSIS — D631 Anemia in chronic kidney disease: Secondary | ICD-10-CM | POA: Diagnosis not present

## 2014-02-10 DIAGNOSIS — E119 Type 2 diabetes mellitus without complications: Secondary | ICD-10-CM | POA: Diagnosis not present

## 2014-02-10 DIAGNOSIS — D509 Iron deficiency anemia, unspecified: Secondary | ICD-10-CM | POA: Diagnosis not present

## 2014-02-10 DIAGNOSIS — N039 Chronic nephritic syndrome with unspecified morphologic changes: Secondary | ICD-10-CM | POA: Diagnosis not present

## 2014-02-13 DIAGNOSIS — D631 Anemia in chronic kidney disease: Secondary | ICD-10-CM | POA: Diagnosis not present

## 2014-02-13 DIAGNOSIS — Z23 Encounter for immunization: Secondary | ICD-10-CM | POA: Diagnosis not present

## 2014-02-13 DIAGNOSIS — E119 Type 2 diabetes mellitus without complications: Secondary | ICD-10-CM | POA: Diagnosis not present

## 2014-02-13 DIAGNOSIS — D509 Iron deficiency anemia, unspecified: Secondary | ICD-10-CM | POA: Diagnosis not present

## 2014-02-13 DIAGNOSIS — N186 End stage renal disease: Secondary | ICD-10-CM | POA: Diagnosis not present

## 2014-02-15 DIAGNOSIS — Z23 Encounter for immunization: Secondary | ICD-10-CM | POA: Diagnosis not present

## 2014-02-15 DIAGNOSIS — N039 Chronic nephritic syndrome with unspecified morphologic changes: Secondary | ICD-10-CM | POA: Diagnosis not present

## 2014-02-15 DIAGNOSIS — D509 Iron deficiency anemia, unspecified: Secondary | ICD-10-CM | POA: Diagnosis not present

## 2014-02-15 DIAGNOSIS — N186 End stage renal disease: Secondary | ICD-10-CM | POA: Diagnosis not present

## 2014-02-15 DIAGNOSIS — E119 Type 2 diabetes mellitus without complications: Secondary | ICD-10-CM | POA: Diagnosis not present

## 2014-02-15 DIAGNOSIS — D631 Anemia in chronic kidney disease: Secondary | ICD-10-CM | POA: Diagnosis not present

## 2014-02-16 DIAGNOSIS — N186 End stage renal disease: Secondary | ICD-10-CM | POA: Diagnosis not present

## 2014-02-17 DIAGNOSIS — N039 Chronic nephritic syndrome with unspecified morphologic changes: Secondary | ICD-10-CM | POA: Diagnosis not present

## 2014-02-17 DIAGNOSIS — D631 Anemia in chronic kidney disease: Secondary | ICD-10-CM | POA: Diagnosis not present

## 2014-02-17 DIAGNOSIS — E119 Type 2 diabetes mellitus without complications: Secondary | ICD-10-CM | POA: Diagnosis not present

## 2014-02-17 DIAGNOSIS — D509 Iron deficiency anemia, unspecified: Secondary | ICD-10-CM | POA: Diagnosis not present

## 2014-02-17 DIAGNOSIS — Z23 Encounter for immunization: Secondary | ICD-10-CM | POA: Diagnosis not present

## 2014-02-17 DIAGNOSIS — N186 End stage renal disease: Secondary | ICD-10-CM | POA: Diagnosis not present

## 2014-02-20 DIAGNOSIS — N186 End stage renal disease: Secondary | ICD-10-CM | POA: Diagnosis not present

## 2014-02-20 DIAGNOSIS — Z23 Encounter for immunization: Secondary | ICD-10-CM | POA: Diagnosis not present

## 2014-02-20 DIAGNOSIS — D509 Iron deficiency anemia, unspecified: Secondary | ICD-10-CM | POA: Diagnosis not present

## 2014-02-20 DIAGNOSIS — N039 Chronic nephritic syndrome with unspecified morphologic changes: Secondary | ICD-10-CM | POA: Diagnosis not present

## 2014-02-20 DIAGNOSIS — D631 Anemia in chronic kidney disease: Secondary | ICD-10-CM | POA: Diagnosis not present

## 2014-02-20 DIAGNOSIS — E119 Type 2 diabetes mellitus without complications: Secondary | ICD-10-CM | POA: Diagnosis not present

## 2014-02-22 DIAGNOSIS — Z23 Encounter for immunization: Secondary | ICD-10-CM | POA: Diagnosis not present

## 2014-02-22 DIAGNOSIS — D509 Iron deficiency anemia, unspecified: Secondary | ICD-10-CM | POA: Diagnosis not present

## 2014-02-22 DIAGNOSIS — E119 Type 2 diabetes mellitus without complications: Secondary | ICD-10-CM | POA: Diagnosis not present

## 2014-02-22 DIAGNOSIS — D631 Anemia in chronic kidney disease: Secondary | ICD-10-CM | POA: Diagnosis not present

## 2014-02-22 DIAGNOSIS — N186 End stage renal disease: Secondary | ICD-10-CM | POA: Diagnosis not present

## 2014-02-24 DIAGNOSIS — D631 Anemia in chronic kidney disease: Secondary | ICD-10-CM | POA: Diagnosis not present

## 2014-02-24 DIAGNOSIS — Z23 Encounter for immunization: Secondary | ICD-10-CM | POA: Diagnosis not present

## 2014-02-24 DIAGNOSIS — D509 Iron deficiency anemia, unspecified: Secondary | ICD-10-CM | POA: Diagnosis not present

## 2014-02-24 DIAGNOSIS — N186 End stage renal disease: Secondary | ICD-10-CM | POA: Diagnosis not present

## 2014-02-24 DIAGNOSIS — E119 Type 2 diabetes mellitus without complications: Secondary | ICD-10-CM | POA: Diagnosis not present

## 2014-02-27 DIAGNOSIS — D631 Anemia in chronic kidney disease: Secondary | ICD-10-CM | POA: Diagnosis not present

## 2014-02-27 DIAGNOSIS — Z23 Encounter for immunization: Secondary | ICD-10-CM | POA: Diagnosis not present

## 2014-02-27 DIAGNOSIS — N186 End stage renal disease: Secondary | ICD-10-CM | POA: Diagnosis not present

## 2014-02-27 DIAGNOSIS — N039 Chronic nephritic syndrome with unspecified morphologic changes: Secondary | ICD-10-CM | POA: Diagnosis not present

## 2014-02-27 DIAGNOSIS — D509 Iron deficiency anemia, unspecified: Secondary | ICD-10-CM | POA: Diagnosis not present

## 2014-02-27 DIAGNOSIS — E119 Type 2 diabetes mellitus without complications: Secondary | ICD-10-CM | POA: Diagnosis not present

## 2014-03-01 ENCOUNTER — Encounter: Payer: Self-pay | Admitting: Vascular Surgery

## 2014-03-01 DIAGNOSIS — E119 Type 2 diabetes mellitus without complications: Secondary | ICD-10-CM | POA: Diagnosis not present

## 2014-03-01 DIAGNOSIS — Z23 Encounter for immunization: Secondary | ICD-10-CM | POA: Diagnosis not present

## 2014-03-01 DIAGNOSIS — D631 Anemia in chronic kidney disease: Secondary | ICD-10-CM | POA: Diagnosis not present

## 2014-03-01 DIAGNOSIS — D509 Iron deficiency anemia, unspecified: Secondary | ICD-10-CM | POA: Diagnosis not present

## 2014-03-01 DIAGNOSIS — N186 End stage renal disease: Secondary | ICD-10-CM | POA: Diagnosis not present

## 2014-03-02 ENCOUNTER — Ambulatory Visit (INDEPENDENT_AMBULATORY_CARE_PROVIDER_SITE_OTHER): Payer: Medicare Other | Admitting: Vascular Surgery

## 2014-03-02 ENCOUNTER — Encounter: Payer: Self-pay | Admitting: Vascular Surgery

## 2014-03-02 VITALS — BP 131/55 | HR 76 | Ht 68.0 in | Wt 168.2 lb

## 2014-03-02 DIAGNOSIS — N186 End stage renal disease: Secondary | ICD-10-CM

## 2014-03-02 NOTE — Progress Notes (Signed)
    Postoperative Access Visit   History of Present Illness  Lucas Calderon is a 73 y.o. year old male who presents for postoperative follow-up for: Revision of L RC AVF, L arm fistulogram (Date: 01/31/14).  The patient's wounds are healed.  The patient notes no steal symptoms.  The patient is able to complete their activities of daily living.  The patient's current symptoms are: none.  The L RC AVF is already being used for HD.  For VQI Use Only  PRE-ADM LIVING: Home  AMB STATUS: Ambulatory  Physical Examination Filed Vitals:   03/02/14 0834  BP: 131/55  Pulse: 76    LUE: Incision is healed, skin feels warm, hand grip is 5/5, sensation in digits is intact, palpable thrill, bruit can  be auscultated , palpable radial pulse  Medical Decision Making  Lucas Calderon is a 73 y.o. year old male who presents s/p L RC AVF revision, L arm fistulogram.  The patient's access is ready for use and is already being used  Thank you for allowing [place] to participate in this patient's care.  Adele Barthel, MD [facility] [place] Office: [phone] Pager: [phone]  03/02/2014, 8:48 AM

## 2014-03-03 DIAGNOSIS — Z23 Encounter for immunization: Secondary | ICD-10-CM | POA: Diagnosis not present

## 2014-03-03 DIAGNOSIS — N186 End stage renal disease: Secondary | ICD-10-CM | POA: Diagnosis not present

## 2014-03-03 DIAGNOSIS — D631 Anemia in chronic kidney disease: Secondary | ICD-10-CM | POA: Diagnosis not present

## 2014-03-03 DIAGNOSIS — D509 Iron deficiency anemia, unspecified: Secondary | ICD-10-CM | POA: Diagnosis not present

## 2014-03-03 DIAGNOSIS — E119 Type 2 diabetes mellitus without complications: Secondary | ICD-10-CM | POA: Diagnosis not present

## 2014-03-06 DIAGNOSIS — E119 Type 2 diabetes mellitus without complications: Secondary | ICD-10-CM | POA: Diagnosis not present

## 2014-03-06 DIAGNOSIS — Z23 Encounter for immunization: Secondary | ICD-10-CM | POA: Diagnosis not present

## 2014-03-06 DIAGNOSIS — D509 Iron deficiency anemia, unspecified: Secondary | ICD-10-CM | POA: Diagnosis not present

## 2014-03-06 DIAGNOSIS — D631 Anemia in chronic kidney disease: Secondary | ICD-10-CM | POA: Diagnosis not present

## 2014-03-06 DIAGNOSIS — N186 End stage renal disease: Secondary | ICD-10-CM | POA: Diagnosis not present

## 2014-03-08 DIAGNOSIS — Z23 Encounter for immunization: Secondary | ICD-10-CM | POA: Diagnosis not present

## 2014-03-08 DIAGNOSIS — E119 Type 2 diabetes mellitus without complications: Secondary | ICD-10-CM | POA: Diagnosis not present

## 2014-03-08 DIAGNOSIS — D631 Anemia in chronic kidney disease: Secondary | ICD-10-CM | POA: Diagnosis not present

## 2014-03-08 DIAGNOSIS — D509 Iron deficiency anemia, unspecified: Secondary | ICD-10-CM | POA: Diagnosis not present

## 2014-03-08 DIAGNOSIS — N186 End stage renal disease: Secondary | ICD-10-CM | POA: Diagnosis not present

## 2014-03-10 DIAGNOSIS — Z23 Encounter for immunization: Secondary | ICD-10-CM | POA: Diagnosis not present

## 2014-03-10 DIAGNOSIS — E119 Type 2 diabetes mellitus without complications: Secondary | ICD-10-CM | POA: Diagnosis not present

## 2014-03-10 DIAGNOSIS — D631 Anemia in chronic kidney disease: Secondary | ICD-10-CM | POA: Diagnosis not present

## 2014-03-10 DIAGNOSIS — N186 End stage renal disease: Secondary | ICD-10-CM | POA: Diagnosis not present

## 2014-03-10 DIAGNOSIS — D509 Iron deficiency anemia, unspecified: Secondary | ICD-10-CM | POA: Diagnosis not present

## 2014-03-13 DIAGNOSIS — N186 End stage renal disease: Secondary | ICD-10-CM | POA: Diagnosis not present

## 2014-03-13 DIAGNOSIS — D631 Anemia in chronic kidney disease: Secondary | ICD-10-CM | POA: Diagnosis not present

## 2014-03-13 DIAGNOSIS — E119 Type 2 diabetes mellitus without complications: Secondary | ICD-10-CM | POA: Diagnosis not present

## 2014-03-13 DIAGNOSIS — Z23 Encounter for immunization: Secondary | ICD-10-CM | POA: Diagnosis not present

## 2014-03-13 DIAGNOSIS — D509 Iron deficiency anemia, unspecified: Secondary | ICD-10-CM | POA: Diagnosis not present

## 2014-03-13 DIAGNOSIS — N039 Chronic nephritic syndrome with unspecified morphologic changes: Secondary | ICD-10-CM | POA: Diagnosis not present

## 2014-03-15 DIAGNOSIS — N039 Chronic nephritic syndrome with unspecified morphologic changes: Secondary | ICD-10-CM | POA: Diagnosis not present

## 2014-03-15 DIAGNOSIS — D509 Iron deficiency anemia, unspecified: Secondary | ICD-10-CM | POA: Diagnosis not present

## 2014-03-15 DIAGNOSIS — Z23 Encounter for immunization: Secondary | ICD-10-CM | POA: Diagnosis not present

## 2014-03-15 DIAGNOSIS — N186 End stage renal disease: Secondary | ICD-10-CM | POA: Diagnosis not present

## 2014-03-15 DIAGNOSIS — E119 Type 2 diabetes mellitus without complications: Secondary | ICD-10-CM | POA: Diagnosis not present

## 2014-03-15 DIAGNOSIS — D631 Anemia in chronic kidney disease: Secondary | ICD-10-CM | POA: Diagnosis not present

## 2014-03-17 DIAGNOSIS — E119 Type 2 diabetes mellitus without complications: Secondary | ICD-10-CM | POA: Diagnosis not present

## 2014-03-17 DIAGNOSIS — D631 Anemia in chronic kidney disease: Secondary | ICD-10-CM | POA: Diagnosis not present

## 2014-03-17 DIAGNOSIS — D509 Iron deficiency anemia, unspecified: Secondary | ICD-10-CM | POA: Diagnosis not present

## 2014-03-17 DIAGNOSIS — N186 End stage renal disease: Secondary | ICD-10-CM | POA: Diagnosis not present

## 2014-03-17 DIAGNOSIS — Z23 Encounter for immunization: Secondary | ICD-10-CM | POA: Diagnosis not present

## 2014-03-19 DIAGNOSIS — N186 End stage renal disease: Secondary | ICD-10-CM | POA: Diagnosis not present

## 2014-03-20 DIAGNOSIS — D509 Iron deficiency anemia, unspecified: Secondary | ICD-10-CM | POA: Diagnosis not present

## 2014-03-20 DIAGNOSIS — D631 Anemia in chronic kidney disease: Secondary | ICD-10-CM | POA: Diagnosis not present

## 2014-03-20 DIAGNOSIS — E119 Type 2 diabetes mellitus without complications: Secondary | ICD-10-CM | POA: Diagnosis not present

## 2014-03-20 DIAGNOSIS — N186 End stage renal disease: Secondary | ICD-10-CM | POA: Diagnosis not present

## 2014-03-22 DIAGNOSIS — N039 Chronic nephritic syndrome with unspecified morphologic changes: Secondary | ICD-10-CM | POA: Diagnosis not present

## 2014-03-22 DIAGNOSIS — N186 End stage renal disease: Secondary | ICD-10-CM | POA: Diagnosis not present

## 2014-03-22 DIAGNOSIS — E119 Type 2 diabetes mellitus without complications: Secondary | ICD-10-CM | POA: Diagnosis not present

## 2014-03-22 DIAGNOSIS — D509 Iron deficiency anemia, unspecified: Secondary | ICD-10-CM | POA: Diagnosis not present

## 2014-03-22 DIAGNOSIS — D631 Anemia in chronic kidney disease: Secondary | ICD-10-CM | POA: Diagnosis not present

## 2014-03-24 DIAGNOSIS — E119 Type 2 diabetes mellitus without complications: Secondary | ICD-10-CM | POA: Diagnosis not present

## 2014-03-24 DIAGNOSIS — D509 Iron deficiency anemia, unspecified: Secondary | ICD-10-CM | POA: Diagnosis not present

## 2014-03-24 DIAGNOSIS — D631 Anemia in chronic kidney disease: Secondary | ICD-10-CM | POA: Diagnosis not present

## 2014-03-24 DIAGNOSIS — N186 End stage renal disease: Secondary | ICD-10-CM | POA: Diagnosis not present

## 2014-03-27 DIAGNOSIS — D509 Iron deficiency anemia, unspecified: Secondary | ICD-10-CM | POA: Diagnosis not present

## 2014-03-27 DIAGNOSIS — N186 End stage renal disease: Secondary | ICD-10-CM | POA: Diagnosis not present

## 2014-03-27 DIAGNOSIS — D631 Anemia in chronic kidney disease: Secondary | ICD-10-CM | POA: Diagnosis not present

## 2014-03-27 DIAGNOSIS — E119 Type 2 diabetes mellitus without complications: Secondary | ICD-10-CM | POA: Diagnosis not present

## 2014-03-29 DIAGNOSIS — D631 Anemia in chronic kidney disease: Secondary | ICD-10-CM | POA: Diagnosis not present

## 2014-03-29 DIAGNOSIS — N039 Chronic nephritic syndrome with unspecified morphologic changes: Secondary | ICD-10-CM | POA: Diagnosis not present

## 2014-03-29 DIAGNOSIS — D509 Iron deficiency anemia, unspecified: Secondary | ICD-10-CM | POA: Diagnosis not present

## 2014-03-29 DIAGNOSIS — N186 End stage renal disease: Secondary | ICD-10-CM | POA: Diagnosis not present

## 2014-03-29 DIAGNOSIS — E119 Type 2 diabetes mellitus without complications: Secondary | ICD-10-CM | POA: Diagnosis not present

## 2014-03-31 DIAGNOSIS — D631 Anemia in chronic kidney disease: Secondary | ICD-10-CM | POA: Diagnosis not present

## 2014-03-31 DIAGNOSIS — E119 Type 2 diabetes mellitus without complications: Secondary | ICD-10-CM | POA: Diagnosis not present

## 2014-03-31 DIAGNOSIS — D509 Iron deficiency anemia, unspecified: Secondary | ICD-10-CM | POA: Diagnosis not present

## 2014-03-31 DIAGNOSIS — N186 End stage renal disease: Secondary | ICD-10-CM | POA: Diagnosis not present

## 2014-04-03 DIAGNOSIS — D509 Iron deficiency anemia, unspecified: Secondary | ICD-10-CM | POA: Diagnosis not present

## 2014-04-03 DIAGNOSIS — D631 Anemia in chronic kidney disease: Secondary | ICD-10-CM | POA: Diagnosis not present

## 2014-04-03 DIAGNOSIS — E119 Type 2 diabetes mellitus without complications: Secondary | ICD-10-CM | POA: Diagnosis not present

## 2014-04-03 DIAGNOSIS — N186 End stage renal disease: Secondary | ICD-10-CM | POA: Diagnosis not present

## 2014-04-05 DIAGNOSIS — N186 End stage renal disease: Secondary | ICD-10-CM | POA: Diagnosis not present

## 2014-04-05 DIAGNOSIS — D631 Anemia in chronic kidney disease: Secondary | ICD-10-CM | POA: Diagnosis not present

## 2014-04-05 DIAGNOSIS — E119 Type 2 diabetes mellitus without complications: Secondary | ICD-10-CM | POA: Diagnosis not present

## 2014-04-05 DIAGNOSIS — D509 Iron deficiency anemia, unspecified: Secondary | ICD-10-CM | POA: Diagnosis not present

## 2014-04-07 DIAGNOSIS — E119 Type 2 diabetes mellitus without complications: Secondary | ICD-10-CM | POA: Diagnosis not present

## 2014-04-07 DIAGNOSIS — N186 End stage renal disease: Secondary | ICD-10-CM | POA: Diagnosis not present

## 2014-04-07 DIAGNOSIS — N039 Chronic nephritic syndrome with unspecified morphologic changes: Secondary | ICD-10-CM | POA: Diagnosis not present

## 2014-04-07 DIAGNOSIS — D631 Anemia in chronic kidney disease: Secondary | ICD-10-CM | POA: Diagnosis not present

## 2014-04-07 DIAGNOSIS — D509 Iron deficiency anemia, unspecified: Secondary | ICD-10-CM | POA: Diagnosis not present

## 2014-04-10 DIAGNOSIS — D509 Iron deficiency anemia, unspecified: Secondary | ICD-10-CM | POA: Diagnosis not present

## 2014-04-10 DIAGNOSIS — N186 End stage renal disease: Secondary | ICD-10-CM | POA: Diagnosis not present

## 2014-04-10 DIAGNOSIS — D631 Anemia in chronic kidney disease: Secondary | ICD-10-CM | POA: Diagnosis not present

## 2014-04-10 DIAGNOSIS — E119 Type 2 diabetes mellitus without complications: Secondary | ICD-10-CM | POA: Diagnosis not present

## 2014-04-12 DIAGNOSIS — N186 End stage renal disease: Secondary | ICD-10-CM | POA: Diagnosis not present

## 2014-04-12 DIAGNOSIS — D631 Anemia in chronic kidney disease: Secondary | ICD-10-CM | POA: Diagnosis not present

## 2014-04-12 DIAGNOSIS — D509 Iron deficiency anemia, unspecified: Secondary | ICD-10-CM | POA: Diagnosis not present

## 2014-04-12 DIAGNOSIS — N039 Chronic nephritic syndrome with unspecified morphologic changes: Secondary | ICD-10-CM | POA: Diagnosis not present

## 2014-04-12 DIAGNOSIS — E119 Type 2 diabetes mellitus without complications: Secondary | ICD-10-CM | POA: Diagnosis not present

## 2014-04-14 DIAGNOSIS — N186 End stage renal disease: Secondary | ICD-10-CM | POA: Diagnosis not present

## 2014-04-14 DIAGNOSIS — D509 Iron deficiency anemia, unspecified: Secondary | ICD-10-CM | POA: Diagnosis not present

## 2014-04-14 DIAGNOSIS — E119 Type 2 diabetes mellitus without complications: Secondary | ICD-10-CM | POA: Diagnosis not present

## 2014-04-14 DIAGNOSIS — D631 Anemia in chronic kidney disease: Secondary | ICD-10-CM | POA: Diagnosis not present

## 2014-04-17 DIAGNOSIS — E119 Type 2 diabetes mellitus without complications: Secondary | ICD-10-CM | POA: Diagnosis not present

## 2014-04-17 DIAGNOSIS — D509 Iron deficiency anemia, unspecified: Secondary | ICD-10-CM | POA: Diagnosis not present

## 2014-04-17 DIAGNOSIS — D631 Anemia in chronic kidney disease: Secondary | ICD-10-CM | POA: Diagnosis not present

## 2014-04-17 DIAGNOSIS — N186 End stage renal disease: Secondary | ICD-10-CM | POA: Diagnosis not present

## 2014-04-18 DIAGNOSIS — N186 End stage renal disease: Secondary | ICD-10-CM | POA: Diagnosis not present

## 2014-04-19 DIAGNOSIS — D508 Other iron deficiency anemias: Secondary | ICD-10-CM | POA: Diagnosis not present

## 2014-04-19 DIAGNOSIS — Z23 Encounter for immunization: Secondary | ICD-10-CM | POA: Diagnosis not present

## 2014-04-19 DIAGNOSIS — D631 Anemia in chronic kidney disease: Secondary | ICD-10-CM | POA: Diagnosis not present

## 2014-04-19 DIAGNOSIS — D509 Iron deficiency anemia, unspecified: Secondary | ICD-10-CM | POA: Diagnosis not present

## 2014-04-19 DIAGNOSIS — N186 End stage renal disease: Secondary | ICD-10-CM | POA: Diagnosis not present

## 2014-04-19 DIAGNOSIS — E119 Type 2 diabetes mellitus without complications: Secondary | ICD-10-CM | POA: Diagnosis not present

## 2014-04-30 DIAGNOSIS — R05 Cough: Secondary | ICD-10-CM | POA: Diagnosis not present

## 2014-04-30 DIAGNOSIS — Z1389 Encounter for screening for other disorder: Secondary | ICD-10-CM | POA: Diagnosis not present

## 2014-04-30 DIAGNOSIS — N19 Unspecified kidney failure: Secondary | ICD-10-CM | POA: Diagnosis not present

## 2014-04-30 DIAGNOSIS — Z139 Encounter for screening, unspecified: Secondary | ICD-10-CM | POA: Diagnosis not present

## 2014-04-30 DIAGNOSIS — E1165 Type 2 diabetes mellitus with hyperglycemia: Secondary | ICD-10-CM | POA: Diagnosis not present

## 2014-04-30 DIAGNOSIS — E118 Type 2 diabetes mellitus with unspecified complications: Secondary | ICD-10-CM | POA: Diagnosis not present

## 2014-04-30 DIAGNOSIS — Z0000 Encounter for general adult medical examination without abnormal findings: Secondary | ICD-10-CM | POA: Diagnosis not present

## 2014-05-17 DIAGNOSIS — E119 Type 2 diabetes mellitus without complications: Secondary | ICD-10-CM | POA: Diagnosis not present

## 2014-05-17 DIAGNOSIS — E748 Other specified disorders of carbohydrate metabolism: Secondary | ICD-10-CM | POA: Diagnosis not present

## 2014-05-17 DIAGNOSIS — N186 End stage renal disease: Secondary | ICD-10-CM | POA: Diagnosis not present

## 2014-05-19 DIAGNOSIS — N186 End stage renal disease: Secondary | ICD-10-CM | POA: Diagnosis not present

## 2014-05-19 DIAGNOSIS — Z992 Dependence on renal dialysis: Secondary | ICD-10-CM | POA: Diagnosis not present

## 2014-05-22 DIAGNOSIS — D509 Iron deficiency anemia, unspecified: Secondary | ICD-10-CM | POA: Diagnosis not present

## 2014-05-22 DIAGNOSIS — D508 Other iron deficiency anemias: Secondary | ICD-10-CM | POA: Diagnosis not present

## 2014-05-22 DIAGNOSIS — N186 End stage renal disease: Secondary | ICD-10-CM | POA: Diagnosis not present

## 2014-05-22 DIAGNOSIS — D631 Anemia in chronic kidney disease: Secondary | ICD-10-CM | POA: Diagnosis not present

## 2014-05-23 DIAGNOSIS — E1165 Type 2 diabetes mellitus with hyperglycemia: Secondary | ICD-10-CM | POA: Diagnosis not present

## 2014-05-23 DIAGNOSIS — E118 Type 2 diabetes mellitus with unspecified complications: Secondary | ICD-10-CM | POA: Diagnosis not present

## 2014-05-23 DIAGNOSIS — R972 Elevated prostate specific antigen [PSA]: Secondary | ICD-10-CM | POA: Diagnosis not present

## 2014-05-24 DIAGNOSIS — D631 Anemia in chronic kidney disease: Secondary | ICD-10-CM | POA: Diagnosis not present

## 2014-05-24 DIAGNOSIS — N186 End stage renal disease: Secondary | ICD-10-CM | POA: Diagnosis not present

## 2014-05-24 DIAGNOSIS — D508 Other iron deficiency anemias: Secondary | ICD-10-CM | POA: Diagnosis not present

## 2014-05-24 DIAGNOSIS — D509 Iron deficiency anemia, unspecified: Secondary | ICD-10-CM | POA: Diagnosis not present

## 2014-05-26 DIAGNOSIS — N186 End stage renal disease: Secondary | ICD-10-CM | POA: Diagnosis not present

## 2014-05-26 DIAGNOSIS — D509 Iron deficiency anemia, unspecified: Secondary | ICD-10-CM | POA: Diagnosis not present

## 2014-05-26 DIAGNOSIS — D508 Other iron deficiency anemias: Secondary | ICD-10-CM | POA: Diagnosis not present

## 2014-05-26 DIAGNOSIS — D631 Anemia in chronic kidney disease: Secondary | ICD-10-CM | POA: Diagnosis not present

## 2014-05-29 DIAGNOSIS — D631 Anemia in chronic kidney disease: Secondary | ICD-10-CM | POA: Diagnosis not present

## 2014-05-29 DIAGNOSIS — D509 Iron deficiency anemia, unspecified: Secondary | ICD-10-CM | POA: Diagnosis not present

## 2014-05-29 DIAGNOSIS — D508 Other iron deficiency anemias: Secondary | ICD-10-CM | POA: Diagnosis not present

## 2014-05-29 DIAGNOSIS — N186 End stage renal disease: Secondary | ICD-10-CM | POA: Diagnosis not present

## 2014-05-31 DIAGNOSIS — D509 Iron deficiency anemia, unspecified: Secondary | ICD-10-CM | POA: Diagnosis not present

## 2014-05-31 DIAGNOSIS — N186 End stage renal disease: Secondary | ICD-10-CM | POA: Diagnosis not present

## 2014-05-31 DIAGNOSIS — D508 Other iron deficiency anemias: Secondary | ICD-10-CM | POA: Diagnosis not present

## 2014-05-31 DIAGNOSIS — D631 Anemia in chronic kidney disease: Secondary | ICD-10-CM | POA: Diagnosis not present

## 2014-06-02 DIAGNOSIS — D509 Iron deficiency anemia, unspecified: Secondary | ICD-10-CM | POA: Diagnosis not present

## 2014-06-02 DIAGNOSIS — D631 Anemia in chronic kidney disease: Secondary | ICD-10-CM | POA: Diagnosis not present

## 2014-06-02 DIAGNOSIS — N186 End stage renal disease: Secondary | ICD-10-CM | POA: Diagnosis not present

## 2014-06-02 DIAGNOSIS — D508 Other iron deficiency anemias: Secondary | ICD-10-CM | POA: Diagnosis not present

## 2014-06-05 DIAGNOSIS — D631 Anemia in chronic kidney disease: Secondary | ICD-10-CM | POA: Diagnosis not present

## 2014-06-05 DIAGNOSIS — D509 Iron deficiency anemia, unspecified: Secondary | ICD-10-CM | POA: Diagnosis not present

## 2014-06-05 DIAGNOSIS — D508 Other iron deficiency anemias: Secondary | ICD-10-CM | POA: Diagnosis not present

## 2014-06-05 DIAGNOSIS — N186 End stage renal disease: Secondary | ICD-10-CM | POA: Diagnosis not present

## 2014-06-07 DIAGNOSIS — N186 End stage renal disease: Secondary | ICD-10-CM | POA: Diagnosis not present

## 2014-06-07 DIAGNOSIS — D508 Other iron deficiency anemias: Secondary | ICD-10-CM | POA: Diagnosis not present

## 2014-06-07 DIAGNOSIS — D509 Iron deficiency anemia, unspecified: Secondary | ICD-10-CM | POA: Diagnosis not present

## 2014-06-07 DIAGNOSIS — D631 Anemia in chronic kidney disease: Secondary | ICD-10-CM | POA: Diagnosis not present

## 2014-06-09 DIAGNOSIS — N186 End stage renal disease: Secondary | ICD-10-CM | POA: Diagnosis not present

## 2014-06-09 DIAGNOSIS — D509 Iron deficiency anemia, unspecified: Secondary | ICD-10-CM | POA: Diagnosis not present

## 2014-06-09 DIAGNOSIS — D508 Other iron deficiency anemias: Secondary | ICD-10-CM | POA: Diagnosis not present

## 2014-06-09 DIAGNOSIS — D631 Anemia in chronic kidney disease: Secondary | ICD-10-CM | POA: Diagnosis not present

## 2014-06-12 DIAGNOSIS — D508 Other iron deficiency anemias: Secondary | ICD-10-CM | POA: Diagnosis not present

## 2014-06-12 DIAGNOSIS — D631 Anemia in chronic kidney disease: Secondary | ICD-10-CM | POA: Diagnosis not present

## 2014-06-12 DIAGNOSIS — N186 End stage renal disease: Secondary | ICD-10-CM | POA: Diagnosis not present

## 2014-06-12 DIAGNOSIS — D509 Iron deficiency anemia, unspecified: Secondary | ICD-10-CM | POA: Diagnosis not present

## 2014-06-14 DIAGNOSIS — N186 End stage renal disease: Secondary | ICD-10-CM | POA: Diagnosis not present

## 2014-06-16 DIAGNOSIS — N186 End stage renal disease: Secondary | ICD-10-CM | POA: Diagnosis not present

## 2014-06-18 DIAGNOSIS — Z992 Dependence on renal dialysis: Secondary | ICD-10-CM | POA: Diagnosis not present

## 2014-06-18 DIAGNOSIS — N186 End stage renal disease: Secondary | ICD-10-CM | POA: Diagnosis not present

## 2014-06-19 DIAGNOSIS — Z23 Encounter for immunization: Secondary | ICD-10-CM | POA: Diagnosis not present

## 2014-06-19 DIAGNOSIS — N186 End stage renal disease: Secondary | ICD-10-CM | POA: Diagnosis not present

## 2014-06-19 DIAGNOSIS — E119 Type 2 diabetes mellitus without complications: Secondary | ICD-10-CM | POA: Diagnosis not present

## 2014-06-19 DIAGNOSIS — D509 Iron deficiency anemia, unspecified: Secondary | ICD-10-CM | POA: Diagnosis not present

## 2014-06-19 DIAGNOSIS — D508 Other iron deficiency anemias: Secondary | ICD-10-CM | POA: Diagnosis not present

## 2014-06-19 DIAGNOSIS — D631 Anemia in chronic kidney disease: Secondary | ICD-10-CM | POA: Diagnosis not present

## 2014-07-19 DIAGNOSIS — Z992 Dependence on renal dialysis: Secondary | ICD-10-CM | POA: Diagnosis not present

## 2014-07-19 DIAGNOSIS — N186 End stage renal disease: Secondary | ICD-10-CM | POA: Diagnosis not present

## 2014-07-21 DIAGNOSIS — E119 Type 2 diabetes mellitus without complications: Secondary | ICD-10-CM | POA: Diagnosis not present

## 2014-07-21 DIAGNOSIS — N186 End stage renal disease: Secondary | ICD-10-CM | POA: Diagnosis not present

## 2014-07-21 DIAGNOSIS — D631 Anemia in chronic kidney disease: Secondary | ICD-10-CM | POA: Diagnosis not present

## 2014-07-24 DIAGNOSIS — N186 End stage renal disease: Secondary | ICD-10-CM | POA: Diagnosis not present

## 2014-07-24 DIAGNOSIS — D631 Anemia in chronic kidney disease: Secondary | ICD-10-CM | POA: Diagnosis not present

## 2014-07-24 DIAGNOSIS — E119 Type 2 diabetes mellitus without complications: Secondary | ICD-10-CM | POA: Diagnosis not present

## 2014-07-26 DIAGNOSIS — D631 Anemia in chronic kidney disease: Secondary | ICD-10-CM | POA: Diagnosis not present

## 2014-07-26 DIAGNOSIS — N186 End stage renal disease: Secondary | ICD-10-CM | POA: Diagnosis not present

## 2014-07-26 DIAGNOSIS — E119 Type 2 diabetes mellitus without complications: Secondary | ICD-10-CM | POA: Diagnosis not present

## 2014-07-28 DIAGNOSIS — D631 Anemia in chronic kidney disease: Secondary | ICD-10-CM | POA: Diagnosis not present

## 2014-07-28 DIAGNOSIS — N186 End stage renal disease: Secondary | ICD-10-CM | POA: Diagnosis not present

## 2014-07-28 DIAGNOSIS — E119 Type 2 diabetes mellitus without complications: Secondary | ICD-10-CM | POA: Diagnosis not present

## 2014-07-31 DIAGNOSIS — N186 End stage renal disease: Secondary | ICD-10-CM | POA: Diagnosis not present

## 2014-07-31 DIAGNOSIS — D631 Anemia in chronic kidney disease: Secondary | ICD-10-CM | POA: Diagnosis not present

## 2014-07-31 DIAGNOSIS — E119 Type 2 diabetes mellitus without complications: Secondary | ICD-10-CM | POA: Diagnosis not present

## 2014-08-02 DIAGNOSIS — E119 Type 2 diabetes mellitus without complications: Secondary | ICD-10-CM | POA: Diagnosis not present

## 2014-08-02 DIAGNOSIS — N186 End stage renal disease: Secondary | ICD-10-CM | POA: Diagnosis not present

## 2014-08-02 DIAGNOSIS — D631 Anemia in chronic kidney disease: Secondary | ICD-10-CM | POA: Diagnosis not present

## 2014-08-04 DIAGNOSIS — N186 End stage renal disease: Secondary | ICD-10-CM | POA: Diagnosis not present

## 2014-08-04 DIAGNOSIS — E119 Type 2 diabetes mellitus without complications: Secondary | ICD-10-CM | POA: Diagnosis not present

## 2014-08-04 DIAGNOSIS — D631 Anemia in chronic kidney disease: Secondary | ICD-10-CM | POA: Diagnosis not present

## 2014-08-07 DIAGNOSIS — N186 End stage renal disease: Secondary | ICD-10-CM | POA: Diagnosis not present

## 2014-08-07 DIAGNOSIS — E119 Type 2 diabetes mellitus without complications: Secondary | ICD-10-CM | POA: Diagnosis not present

## 2014-08-07 DIAGNOSIS — D631 Anemia in chronic kidney disease: Secondary | ICD-10-CM | POA: Diagnosis not present

## 2014-08-09 DIAGNOSIS — D631 Anemia in chronic kidney disease: Secondary | ICD-10-CM | POA: Diagnosis not present

## 2014-08-09 DIAGNOSIS — E119 Type 2 diabetes mellitus without complications: Secondary | ICD-10-CM | POA: Diagnosis not present

## 2014-08-09 DIAGNOSIS — N186 End stage renal disease: Secondary | ICD-10-CM | POA: Diagnosis not present

## 2014-08-11 DIAGNOSIS — E119 Type 2 diabetes mellitus without complications: Secondary | ICD-10-CM | POA: Diagnosis not present

## 2014-08-11 DIAGNOSIS — D631 Anemia in chronic kidney disease: Secondary | ICD-10-CM | POA: Diagnosis not present

## 2014-08-11 DIAGNOSIS — N186 End stage renal disease: Secondary | ICD-10-CM | POA: Diagnosis not present

## 2014-08-14 DIAGNOSIS — N186 End stage renal disease: Secondary | ICD-10-CM | POA: Diagnosis not present

## 2014-08-14 DIAGNOSIS — E119 Type 2 diabetes mellitus without complications: Secondary | ICD-10-CM | POA: Diagnosis not present

## 2014-08-14 DIAGNOSIS — D631 Anemia in chronic kidney disease: Secondary | ICD-10-CM | POA: Diagnosis not present

## 2014-08-16 DIAGNOSIS — D631 Anemia in chronic kidney disease: Secondary | ICD-10-CM | POA: Diagnosis not present

## 2014-08-16 DIAGNOSIS — E119 Type 2 diabetes mellitus without complications: Secondary | ICD-10-CM | POA: Diagnosis not present

## 2014-08-16 DIAGNOSIS — N186 End stage renal disease: Secondary | ICD-10-CM | POA: Diagnosis not present

## 2014-08-18 DIAGNOSIS — E119 Type 2 diabetes mellitus without complications: Secondary | ICD-10-CM | POA: Diagnosis not present

## 2014-08-18 DIAGNOSIS — D631 Anemia in chronic kidney disease: Secondary | ICD-10-CM | POA: Diagnosis not present

## 2014-08-18 DIAGNOSIS — N186 End stage renal disease: Secondary | ICD-10-CM | POA: Diagnosis not present

## 2014-08-19 DIAGNOSIS — N186 End stage renal disease: Secondary | ICD-10-CM | POA: Diagnosis not present

## 2014-08-19 DIAGNOSIS — Z992 Dependence on renal dialysis: Secondary | ICD-10-CM | POA: Diagnosis not present

## 2014-08-21 DIAGNOSIS — N186 End stage renal disease: Secondary | ICD-10-CM | POA: Diagnosis not present

## 2014-08-21 DIAGNOSIS — E119 Type 2 diabetes mellitus without complications: Secondary | ICD-10-CM | POA: Diagnosis not present

## 2014-08-21 DIAGNOSIS — D509 Iron deficiency anemia, unspecified: Secondary | ICD-10-CM | POA: Diagnosis not present

## 2014-08-23 DIAGNOSIS — D509 Iron deficiency anemia, unspecified: Secondary | ICD-10-CM | POA: Diagnosis not present

## 2014-08-23 DIAGNOSIS — E119 Type 2 diabetes mellitus without complications: Secondary | ICD-10-CM | POA: Diagnosis not present

## 2014-08-23 DIAGNOSIS — N186 End stage renal disease: Secondary | ICD-10-CM | POA: Diagnosis not present

## 2014-08-25 DIAGNOSIS — N186 End stage renal disease: Secondary | ICD-10-CM | POA: Diagnosis not present

## 2014-08-25 DIAGNOSIS — E119 Type 2 diabetes mellitus without complications: Secondary | ICD-10-CM | POA: Diagnosis not present

## 2014-08-25 DIAGNOSIS — D509 Iron deficiency anemia, unspecified: Secondary | ICD-10-CM | POA: Diagnosis not present

## 2014-08-28 DIAGNOSIS — E119 Type 2 diabetes mellitus without complications: Secondary | ICD-10-CM | POA: Diagnosis not present

## 2014-08-28 DIAGNOSIS — D509 Iron deficiency anemia, unspecified: Secondary | ICD-10-CM | POA: Diagnosis not present

## 2014-08-28 DIAGNOSIS — N186 End stage renal disease: Secondary | ICD-10-CM | POA: Diagnosis not present

## 2014-08-30 DIAGNOSIS — D509 Iron deficiency anemia, unspecified: Secondary | ICD-10-CM | POA: Diagnosis not present

## 2014-08-30 DIAGNOSIS — N186 End stage renal disease: Secondary | ICD-10-CM | POA: Diagnosis not present

## 2014-08-30 DIAGNOSIS — E119 Type 2 diabetes mellitus without complications: Secondary | ICD-10-CM | POA: Diagnosis not present

## 2014-09-01 DIAGNOSIS — D509 Iron deficiency anemia, unspecified: Secondary | ICD-10-CM | POA: Diagnosis not present

## 2014-09-01 DIAGNOSIS — E119 Type 2 diabetes mellitus without complications: Secondary | ICD-10-CM | POA: Diagnosis not present

## 2014-09-01 DIAGNOSIS — N186 End stage renal disease: Secondary | ICD-10-CM | POA: Diagnosis not present

## 2014-09-04 DIAGNOSIS — E119 Type 2 diabetes mellitus without complications: Secondary | ICD-10-CM | POA: Diagnosis not present

## 2014-09-04 DIAGNOSIS — D509 Iron deficiency anemia, unspecified: Secondary | ICD-10-CM | POA: Diagnosis not present

## 2014-09-04 DIAGNOSIS — N186 End stage renal disease: Secondary | ICD-10-CM | POA: Diagnosis not present

## 2014-09-06 DIAGNOSIS — E119 Type 2 diabetes mellitus without complications: Secondary | ICD-10-CM | POA: Diagnosis not present

## 2014-09-06 DIAGNOSIS — D509 Iron deficiency anemia, unspecified: Secondary | ICD-10-CM | POA: Diagnosis not present

## 2014-09-06 DIAGNOSIS — N186 End stage renal disease: Secondary | ICD-10-CM | POA: Diagnosis not present

## 2014-09-08 DIAGNOSIS — E119 Type 2 diabetes mellitus without complications: Secondary | ICD-10-CM | POA: Diagnosis not present

## 2014-09-08 DIAGNOSIS — N186 End stage renal disease: Secondary | ICD-10-CM | POA: Diagnosis not present

## 2014-09-08 DIAGNOSIS — D509 Iron deficiency anemia, unspecified: Secondary | ICD-10-CM | POA: Diagnosis not present

## 2014-09-11 DIAGNOSIS — D509 Iron deficiency anemia, unspecified: Secondary | ICD-10-CM | POA: Diagnosis not present

## 2014-09-11 DIAGNOSIS — E119 Type 2 diabetes mellitus without complications: Secondary | ICD-10-CM | POA: Diagnosis not present

## 2014-09-11 DIAGNOSIS — N186 End stage renal disease: Secondary | ICD-10-CM | POA: Diagnosis not present

## 2014-09-13 DIAGNOSIS — E119 Type 2 diabetes mellitus without complications: Secondary | ICD-10-CM | POA: Diagnosis not present

## 2014-09-13 DIAGNOSIS — N186 End stage renal disease: Secondary | ICD-10-CM | POA: Diagnosis not present

## 2014-09-13 DIAGNOSIS — D509 Iron deficiency anemia, unspecified: Secondary | ICD-10-CM | POA: Diagnosis not present

## 2014-09-15 DIAGNOSIS — N186 End stage renal disease: Secondary | ICD-10-CM | POA: Diagnosis not present

## 2014-09-15 DIAGNOSIS — D509 Iron deficiency anemia, unspecified: Secondary | ICD-10-CM | POA: Diagnosis not present

## 2014-09-15 DIAGNOSIS — E119 Type 2 diabetes mellitus without complications: Secondary | ICD-10-CM | POA: Diagnosis not present

## 2014-09-17 DIAGNOSIS — N186 End stage renal disease: Secondary | ICD-10-CM | POA: Diagnosis not present

## 2014-09-17 DIAGNOSIS — Z992 Dependence on renal dialysis: Secondary | ICD-10-CM | POA: Diagnosis not present

## 2014-09-18 DIAGNOSIS — E119 Type 2 diabetes mellitus without complications: Secondary | ICD-10-CM | POA: Diagnosis not present

## 2014-09-18 DIAGNOSIS — D509 Iron deficiency anemia, unspecified: Secondary | ICD-10-CM | POA: Diagnosis not present

## 2014-09-18 DIAGNOSIS — N186 End stage renal disease: Secondary | ICD-10-CM | POA: Diagnosis not present

## 2014-09-18 DIAGNOSIS — Z23 Encounter for immunization: Secondary | ICD-10-CM | POA: Diagnosis not present

## 2014-09-18 DIAGNOSIS — D631 Anemia in chronic kidney disease: Secondary | ICD-10-CM | POA: Diagnosis not present

## 2014-09-20 DIAGNOSIS — Z23 Encounter for immunization: Secondary | ICD-10-CM | POA: Diagnosis not present

## 2014-09-20 DIAGNOSIS — D509 Iron deficiency anemia, unspecified: Secondary | ICD-10-CM | POA: Diagnosis not present

## 2014-09-20 DIAGNOSIS — D631 Anemia in chronic kidney disease: Secondary | ICD-10-CM | POA: Diagnosis not present

## 2014-09-20 DIAGNOSIS — N186 End stage renal disease: Secondary | ICD-10-CM | POA: Diagnosis not present

## 2014-09-20 DIAGNOSIS — E119 Type 2 diabetes mellitus without complications: Secondary | ICD-10-CM | POA: Diagnosis not present

## 2014-09-22 DIAGNOSIS — E119 Type 2 diabetes mellitus without complications: Secondary | ICD-10-CM | POA: Diagnosis not present

## 2014-09-22 DIAGNOSIS — Z23 Encounter for immunization: Secondary | ICD-10-CM | POA: Diagnosis not present

## 2014-09-22 DIAGNOSIS — N186 End stage renal disease: Secondary | ICD-10-CM | POA: Diagnosis not present

## 2014-09-22 DIAGNOSIS — D631 Anemia in chronic kidney disease: Secondary | ICD-10-CM | POA: Diagnosis not present

## 2014-09-22 DIAGNOSIS — D509 Iron deficiency anemia, unspecified: Secondary | ICD-10-CM | POA: Diagnosis not present

## 2014-09-25 DIAGNOSIS — D631 Anemia in chronic kidney disease: Secondary | ICD-10-CM | POA: Diagnosis not present

## 2014-09-25 DIAGNOSIS — E119 Type 2 diabetes mellitus without complications: Secondary | ICD-10-CM | POA: Diagnosis not present

## 2014-09-25 DIAGNOSIS — Z23 Encounter for immunization: Secondary | ICD-10-CM | POA: Diagnosis not present

## 2014-09-25 DIAGNOSIS — N186 End stage renal disease: Secondary | ICD-10-CM | POA: Diagnosis not present

## 2014-09-25 DIAGNOSIS — D509 Iron deficiency anemia, unspecified: Secondary | ICD-10-CM | POA: Diagnosis not present

## 2014-09-27 DIAGNOSIS — D509 Iron deficiency anemia, unspecified: Secondary | ICD-10-CM | POA: Diagnosis not present

## 2014-09-27 DIAGNOSIS — N186 End stage renal disease: Secondary | ICD-10-CM | POA: Diagnosis not present

## 2014-09-27 DIAGNOSIS — E119 Type 2 diabetes mellitus without complications: Secondary | ICD-10-CM | POA: Diagnosis not present

## 2014-09-27 DIAGNOSIS — Z23 Encounter for immunization: Secondary | ICD-10-CM | POA: Diagnosis not present

## 2014-09-27 DIAGNOSIS — D631 Anemia in chronic kidney disease: Secondary | ICD-10-CM | POA: Diagnosis not present

## 2014-09-29 DIAGNOSIS — Z23 Encounter for immunization: Secondary | ICD-10-CM | POA: Diagnosis not present

## 2014-09-29 DIAGNOSIS — D631 Anemia in chronic kidney disease: Secondary | ICD-10-CM | POA: Diagnosis not present

## 2014-09-29 DIAGNOSIS — N186 End stage renal disease: Secondary | ICD-10-CM | POA: Diagnosis not present

## 2014-09-29 DIAGNOSIS — E119 Type 2 diabetes mellitus without complications: Secondary | ICD-10-CM | POA: Diagnosis not present

## 2014-09-29 DIAGNOSIS — D509 Iron deficiency anemia, unspecified: Secondary | ICD-10-CM | POA: Diagnosis not present

## 2014-10-02 DIAGNOSIS — D509 Iron deficiency anemia, unspecified: Secondary | ICD-10-CM | POA: Diagnosis not present

## 2014-10-02 DIAGNOSIS — E119 Type 2 diabetes mellitus without complications: Secondary | ICD-10-CM | POA: Diagnosis not present

## 2014-10-02 DIAGNOSIS — D631 Anemia in chronic kidney disease: Secondary | ICD-10-CM | POA: Diagnosis not present

## 2014-10-02 DIAGNOSIS — Z23 Encounter for immunization: Secondary | ICD-10-CM | POA: Diagnosis not present

## 2014-10-02 DIAGNOSIS — N186 End stage renal disease: Secondary | ICD-10-CM | POA: Diagnosis not present

## 2014-10-04 DIAGNOSIS — E119 Type 2 diabetes mellitus without complications: Secondary | ICD-10-CM | POA: Diagnosis not present

## 2014-10-04 DIAGNOSIS — N186 End stage renal disease: Secondary | ICD-10-CM | POA: Diagnosis not present

## 2014-10-04 DIAGNOSIS — D509 Iron deficiency anemia, unspecified: Secondary | ICD-10-CM | POA: Diagnosis not present

## 2014-10-04 DIAGNOSIS — Z23 Encounter for immunization: Secondary | ICD-10-CM | POA: Diagnosis not present

## 2014-10-04 DIAGNOSIS — D631 Anemia in chronic kidney disease: Secondary | ICD-10-CM | POA: Diagnosis not present

## 2014-10-06 DIAGNOSIS — E119 Type 2 diabetes mellitus without complications: Secondary | ICD-10-CM | POA: Diagnosis not present

## 2014-10-06 DIAGNOSIS — Z23 Encounter for immunization: Secondary | ICD-10-CM | POA: Diagnosis not present

## 2014-10-06 DIAGNOSIS — D509 Iron deficiency anemia, unspecified: Secondary | ICD-10-CM | POA: Diagnosis not present

## 2014-10-06 DIAGNOSIS — N186 End stage renal disease: Secondary | ICD-10-CM | POA: Diagnosis not present

## 2014-10-06 DIAGNOSIS — D631 Anemia in chronic kidney disease: Secondary | ICD-10-CM | POA: Diagnosis not present

## 2014-10-09 DIAGNOSIS — N186 End stage renal disease: Secondary | ICD-10-CM | POA: Diagnosis not present

## 2014-10-09 DIAGNOSIS — Z23 Encounter for immunization: Secondary | ICD-10-CM | POA: Diagnosis not present

## 2014-10-09 DIAGNOSIS — D631 Anemia in chronic kidney disease: Secondary | ICD-10-CM | POA: Diagnosis not present

## 2014-10-09 DIAGNOSIS — D509 Iron deficiency anemia, unspecified: Secondary | ICD-10-CM | POA: Diagnosis not present

## 2014-10-09 DIAGNOSIS — E119 Type 2 diabetes mellitus without complications: Secondary | ICD-10-CM | POA: Diagnosis not present

## 2014-10-11 DIAGNOSIS — E119 Type 2 diabetes mellitus without complications: Secondary | ICD-10-CM | POA: Diagnosis not present

## 2014-10-11 DIAGNOSIS — D509 Iron deficiency anemia, unspecified: Secondary | ICD-10-CM | POA: Diagnosis not present

## 2014-10-11 DIAGNOSIS — N186 End stage renal disease: Secondary | ICD-10-CM | POA: Diagnosis not present

## 2014-10-11 DIAGNOSIS — D631 Anemia in chronic kidney disease: Secondary | ICD-10-CM | POA: Diagnosis not present

## 2014-10-11 DIAGNOSIS — Z23 Encounter for immunization: Secondary | ICD-10-CM | POA: Diagnosis not present

## 2014-10-13 DIAGNOSIS — E119 Type 2 diabetes mellitus without complications: Secondary | ICD-10-CM | POA: Diagnosis not present

## 2014-10-13 DIAGNOSIS — N186 End stage renal disease: Secondary | ICD-10-CM | POA: Diagnosis not present

## 2014-10-13 DIAGNOSIS — D509 Iron deficiency anemia, unspecified: Secondary | ICD-10-CM | POA: Diagnosis not present

## 2014-10-13 DIAGNOSIS — Z23 Encounter for immunization: Secondary | ICD-10-CM | POA: Diagnosis not present

## 2014-10-13 DIAGNOSIS — D631 Anemia in chronic kidney disease: Secondary | ICD-10-CM | POA: Diagnosis not present

## 2014-10-16 DIAGNOSIS — Z23 Encounter for immunization: Secondary | ICD-10-CM | POA: Diagnosis not present

## 2014-10-16 DIAGNOSIS — E119 Type 2 diabetes mellitus without complications: Secondary | ICD-10-CM | POA: Diagnosis not present

## 2014-10-16 DIAGNOSIS — D631 Anemia in chronic kidney disease: Secondary | ICD-10-CM | POA: Diagnosis not present

## 2014-10-16 DIAGNOSIS — N186 End stage renal disease: Secondary | ICD-10-CM | POA: Diagnosis not present

## 2014-10-16 DIAGNOSIS — D509 Iron deficiency anemia, unspecified: Secondary | ICD-10-CM | POA: Diagnosis not present

## 2014-10-18 DIAGNOSIS — E1129 Type 2 diabetes mellitus with other diabetic kidney complication: Secondary | ICD-10-CM | POA: Diagnosis not present

## 2014-10-18 DIAGNOSIS — E119 Type 2 diabetes mellitus without complications: Secondary | ICD-10-CM | POA: Diagnosis not present

## 2014-10-18 DIAGNOSIS — D631 Anemia in chronic kidney disease: Secondary | ICD-10-CM | POA: Diagnosis not present

## 2014-10-18 DIAGNOSIS — D509 Iron deficiency anemia, unspecified: Secondary | ICD-10-CM | POA: Diagnosis not present

## 2014-10-18 DIAGNOSIS — N186 End stage renal disease: Secondary | ICD-10-CM | POA: Diagnosis not present

## 2014-10-18 DIAGNOSIS — Z23 Encounter for immunization: Secondary | ICD-10-CM | POA: Diagnosis not present

## 2014-10-18 DIAGNOSIS — Z992 Dependence on renal dialysis: Secondary | ICD-10-CM | POA: Diagnosis not present

## 2014-10-20 DIAGNOSIS — E119 Type 2 diabetes mellitus without complications: Secondary | ICD-10-CM | POA: Diagnosis not present

## 2014-10-20 DIAGNOSIS — D631 Anemia in chronic kidney disease: Secondary | ICD-10-CM | POA: Diagnosis not present

## 2014-10-20 DIAGNOSIS — D509 Iron deficiency anemia, unspecified: Secondary | ICD-10-CM | POA: Diagnosis not present

## 2014-10-20 DIAGNOSIS — Z23 Encounter for immunization: Secondary | ICD-10-CM | POA: Diagnosis not present

## 2014-10-20 DIAGNOSIS — N186 End stage renal disease: Secondary | ICD-10-CM | POA: Diagnosis not present

## 2014-10-23 DIAGNOSIS — Z23 Encounter for immunization: Secondary | ICD-10-CM | POA: Diagnosis not present

## 2014-10-23 DIAGNOSIS — D509 Iron deficiency anemia, unspecified: Secondary | ICD-10-CM | POA: Diagnosis not present

## 2014-10-23 DIAGNOSIS — D631 Anemia in chronic kidney disease: Secondary | ICD-10-CM | POA: Diagnosis not present

## 2014-10-23 DIAGNOSIS — N186 End stage renal disease: Secondary | ICD-10-CM | POA: Diagnosis not present

## 2014-10-23 DIAGNOSIS — E119 Type 2 diabetes mellitus without complications: Secondary | ICD-10-CM | POA: Diagnosis not present

## 2014-10-25 DIAGNOSIS — N186 End stage renal disease: Secondary | ICD-10-CM | POA: Diagnosis not present

## 2014-10-25 DIAGNOSIS — E119 Type 2 diabetes mellitus without complications: Secondary | ICD-10-CM | POA: Diagnosis not present

## 2014-10-25 DIAGNOSIS — Z23 Encounter for immunization: Secondary | ICD-10-CM | POA: Diagnosis not present

## 2014-10-25 DIAGNOSIS — D631 Anemia in chronic kidney disease: Secondary | ICD-10-CM | POA: Diagnosis not present

## 2014-10-25 DIAGNOSIS — D509 Iron deficiency anemia, unspecified: Secondary | ICD-10-CM | POA: Diagnosis not present

## 2014-10-27 DIAGNOSIS — N186 End stage renal disease: Secondary | ICD-10-CM | POA: Diagnosis not present

## 2014-10-27 DIAGNOSIS — Z23 Encounter for immunization: Secondary | ICD-10-CM | POA: Diagnosis not present

## 2014-10-27 DIAGNOSIS — D509 Iron deficiency anemia, unspecified: Secondary | ICD-10-CM | POA: Diagnosis not present

## 2014-10-27 DIAGNOSIS — D631 Anemia in chronic kidney disease: Secondary | ICD-10-CM | POA: Diagnosis not present

## 2014-10-27 DIAGNOSIS — E119 Type 2 diabetes mellitus without complications: Secondary | ICD-10-CM | POA: Diagnosis not present

## 2014-10-30 DIAGNOSIS — N186 End stage renal disease: Secondary | ICD-10-CM | POA: Diagnosis not present

## 2014-10-30 DIAGNOSIS — D509 Iron deficiency anemia, unspecified: Secondary | ICD-10-CM | POA: Diagnosis not present

## 2014-10-30 DIAGNOSIS — Z23 Encounter for immunization: Secondary | ICD-10-CM | POA: Diagnosis not present

## 2014-10-30 DIAGNOSIS — E119 Type 2 diabetes mellitus without complications: Secondary | ICD-10-CM | POA: Diagnosis not present

## 2014-10-30 DIAGNOSIS — D631 Anemia in chronic kidney disease: Secondary | ICD-10-CM | POA: Diagnosis not present

## 2014-11-01 DIAGNOSIS — Z23 Encounter for immunization: Secondary | ICD-10-CM | POA: Diagnosis not present

## 2014-11-01 DIAGNOSIS — N186 End stage renal disease: Secondary | ICD-10-CM | POA: Diagnosis not present

## 2014-11-01 DIAGNOSIS — D631 Anemia in chronic kidney disease: Secondary | ICD-10-CM | POA: Diagnosis not present

## 2014-11-01 DIAGNOSIS — E119 Type 2 diabetes mellitus without complications: Secondary | ICD-10-CM | POA: Diagnosis not present

## 2014-11-01 DIAGNOSIS — D509 Iron deficiency anemia, unspecified: Secondary | ICD-10-CM | POA: Diagnosis not present

## 2014-11-03 DIAGNOSIS — D631 Anemia in chronic kidney disease: Secondary | ICD-10-CM | POA: Diagnosis not present

## 2014-11-03 DIAGNOSIS — N186 End stage renal disease: Secondary | ICD-10-CM | POA: Diagnosis not present

## 2014-11-03 DIAGNOSIS — E119 Type 2 diabetes mellitus without complications: Secondary | ICD-10-CM | POA: Diagnosis not present

## 2014-11-03 DIAGNOSIS — Z23 Encounter for immunization: Secondary | ICD-10-CM | POA: Diagnosis not present

## 2014-11-03 DIAGNOSIS — D509 Iron deficiency anemia, unspecified: Secondary | ICD-10-CM | POA: Diagnosis not present

## 2014-11-06 DIAGNOSIS — D631 Anemia in chronic kidney disease: Secondary | ICD-10-CM | POA: Diagnosis not present

## 2014-11-06 DIAGNOSIS — D509 Iron deficiency anemia, unspecified: Secondary | ICD-10-CM | POA: Diagnosis not present

## 2014-11-06 DIAGNOSIS — N186 End stage renal disease: Secondary | ICD-10-CM | POA: Diagnosis not present

## 2014-11-06 DIAGNOSIS — E119 Type 2 diabetes mellitus without complications: Secondary | ICD-10-CM | POA: Diagnosis not present

## 2014-11-06 DIAGNOSIS — Z23 Encounter for immunization: Secondary | ICD-10-CM | POA: Diagnosis not present

## 2014-11-08 DIAGNOSIS — D509 Iron deficiency anemia, unspecified: Secondary | ICD-10-CM | POA: Diagnosis not present

## 2014-11-08 DIAGNOSIS — N186 End stage renal disease: Secondary | ICD-10-CM | POA: Diagnosis not present

## 2014-11-08 DIAGNOSIS — D631 Anemia in chronic kidney disease: Secondary | ICD-10-CM | POA: Diagnosis not present

## 2014-11-08 DIAGNOSIS — Z23 Encounter for immunization: Secondary | ICD-10-CM | POA: Diagnosis not present

## 2014-11-08 DIAGNOSIS — E119 Type 2 diabetes mellitus without complications: Secondary | ICD-10-CM | POA: Diagnosis not present

## 2014-11-10 DIAGNOSIS — D631 Anemia in chronic kidney disease: Secondary | ICD-10-CM | POA: Diagnosis not present

## 2014-11-10 DIAGNOSIS — E119 Type 2 diabetes mellitus without complications: Secondary | ICD-10-CM | POA: Diagnosis not present

## 2014-11-10 DIAGNOSIS — D509 Iron deficiency anemia, unspecified: Secondary | ICD-10-CM | POA: Diagnosis not present

## 2014-11-10 DIAGNOSIS — Z23 Encounter for immunization: Secondary | ICD-10-CM | POA: Diagnosis not present

## 2014-11-10 DIAGNOSIS — N186 End stage renal disease: Secondary | ICD-10-CM | POA: Diagnosis not present

## 2014-11-13 DIAGNOSIS — D631 Anemia in chronic kidney disease: Secondary | ICD-10-CM | POA: Diagnosis not present

## 2014-11-13 DIAGNOSIS — N186 End stage renal disease: Secondary | ICD-10-CM | POA: Diagnosis not present

## 2014-11-13 DIAGNOSIS — Z23 Encounter for immunization: Secondary | ICD-10-CM | POA: Diagnosis not present

## 2014-11-13 DIAGNOSIS — E119 Type 2 diabetes mellitus without complications: Secondary | ICD-10-CM | POA: Diagnosis not present

## 2014-11-13 DIAGNOSIS — D509 Iron deficiency anemia, unspecified: Secondary | ICD-10-CM | POA: Diagnosis not present

## 2014-11-15 DIAGNOSIS — Z23 Encounter for immunization: Secondary | ICD-10-CM | POA: Diagnosis not present

## 2014-11-15 DIAGNOSIS — E119 Type 2 diabetes mellitus without complications: Secondary | ICD-10-CM | POA: Diagnosis not present

## 2014-11-15 DIAGNOSIS — D509 Iron deficiency anemia, unspecified: Secondary | ICD-10-CM | POA: Diagnosis not present

## 2014-11-15 DIAGNOSIS — D631 Anemia in chronic kidney disease: Secondary | ICD-10-CM | POA: Diagnosis not present

## 2014-11-15 DIAGNOSIS — N186 End stage renal disease: Secondary | ICD-10-CM | POA: Diagnosis not present

## 2014-11-17 DIAGNOSIS — Z23 Encounter for immunization: Secondary | ICD-10-CM | POA: Diagnosis not present

## 2014-11-17 DIAGNOSIS — E119 Type 2 diabetes mellitus without complications: Secondary | ICD-10-CM | POA: Diagnosis not present

## 2014-11-17 DIAGNOSIS — D509 Iron deficiency anemia, unspecified: Secondary | ICD-10-CM | POA: Diagnosis not present

## 2014-11-17 DIAGNOSIS — E1129 Type 2 diabetes mellitus with other diabetic kidney complication: Secondary | ICD-10-CM | POA: Diagnosis not present

## 2014-11-17 DIAGNOSIS — N186 End stage renal disease: Secondary | ICD-10-CM | POA: Diagnosis not present

## 2014-11-17 DIAGNOSIS — D631 Anemia in chronic kidney disease: Secondary | ICD-10-CM | POA: Diagnosis not present

## 2014-11-17 DIAGNOSIS — Z992 Dependence on renal dialysis: Secondary | ICD-10-CM | POA: Diagnosis not present

## 2014-11-20 DIAGNOSIS — Z23 Encounter for immunization: Secondary | ICD-10-CM | POA: Diagnosis not present

## 2014-11-20 DIAGNOSIS — E119 Type 2 diabetes mellitus without complications: Secondary | ICD-10-CM | POA: Diagnosis not present

## 2014-11-20 DIAGNOSIS — D509 Iron deficiency anemia, unspecified: Secondary | ICD-10-CM | POA: Diagnosis not present

## 2014-11-20 DIAGNOSIS — N186 End stage renal disease: Secondary | ICD-10-CM | POA: Diagnosis not present

## 2014-11-20 DIAGNOSIS — D631 Anemia in chronic kidney disease: Secondary | ICD-10-CM | POA: Diagnosis not present

## 2014-11-22 DIAGNOSIS — D631 Anemia in chronic kidney disease: Secondary | ICD-10-CM | POA: Diagnosis not present

## 2014-11-22 DIAGNOSIS — D509 Iron deficiency anemia, unspecified: Secondary | ICD-10-CM | POA: Diagnosis not present

## 2014-11-22 DIAGNOSIS — E119 Type 2 diabetes mellitus without complications: Secondary | ICD-10-CM | POA: Diagnosis not present

## 2014-11-22 DIAGNOSIS — Z23 Encounter for immunization: Secondary | ICD-10-CM | POA: Diagnosis not present

## 2014-11-22 DIAGNOSIS — N186 End stage renal disease: Secondary | ICD-10-CM | POA: Diagnosis not present

## 2014-11-24 DIAGNOSIS — D509 Iron deficiency anemia, unspecified: Secondary | ICD-10-CM | POA: Diagnosis not present

## 2014-11-24 DIAGNOSIS — E119 Type 2 diabetes mellitus without complications: Secondary | ICD-10-CM | POA: Diagnosis not present

## 2014-11-24 DIAGNOSIS — N186 End stage renal disease: Secondary | ICD-10-CM | POA: Diagnosis not present

## 2014-11-24 DIAGNOSIS — Z23 Encounter for immunization: Secondary | ICD-10-CM | POA: Diagnosis not present

## 2014-11-24 DIAGNOSIS — D631 Anemia in chronic kidney disease: Secondary | ICD-10-CM | POA: Diagnosis not present

## 2014-11-27 DIAGNOSIS — Z23 Encounter for immunization: Secondary | ICD-10-CM | POA: Diagnosis not present

## 2014-11-27 DIAGNOSIS — D509 Iron deficiency anemia, unspecified: Secondary | ICD-10-CM | POA: Diagnosis not present

## 2014-11-27 DIAGNOSIS — E119 Type 2 diabetes mellitus without complications: Secondary | ICD-10-CM | POA: Diagnosis not present

## 2014-11-27 DIAGNOSIS — D631 Anemia in chronic kidney disease: Secondary | ICD-10-CM | POA: Diagnosis not present

## 2014-11-27 DIAGNOSIS — N186 End stage renal disease: Secondary | ICD-10-CM | POA: Diagnosis not present

## 2014-11-29 DIAGNOSIS — N186 End stage renal disease: Secondary | ICD-10-CM | POA: Diagnosis not present

## 2014-11-29 DIAGNOSIS — E119 Type 2 diabetes mellitus without complications: Secondary | ICD-10-CM | POA: Diagnosis not present

## 2014-11-29 DIAGNOSIS — Z23 Encounter for immunization: Secondary | ICD-10-CM | POA: Diagnosis not present

## 2014-11-29 DIAGNOSIS — D631 Anemia in chronic kidney disease: Secondary | ICD-10-CM | POA: Diagnosis not present

## 2014-11-29 DIAGNOSIS — D509 Iron deficiency anemia, unspecified: Secondary | ICD-10-CM | POA: Diagnosis not present

## 2014-12-01 DIAGNOSIS — Z23 Encounter for immunization: Secondary | ICD-10-CM | POA: Diagnosis not present

## 2014-12-01 DIAGNOSIS — D509 Iron deficiency anemia, unspecified: Secondary | ICD-10-CM | POA: Diagnosis not present

## 2014-12-01 DIAGNOSIS — E119 Type 2 diabetes mellitus without complications: Secondary | ICD-10-CM | POA: Diagnosis not present

## 2014-12-01 DIAGNOSIS — D631 Anemia in chronic kidney disease: Secondary | ICD-10-CM | POA: Diagnosis not present

## 2014-12-01 DIAGNOSIS — N186 End stage renal disease: Secondary | ICD-10-CM | POA: Diagnosis not present

## 2014-12-04 DIAGNOSIS — N186 End stage renal disease: Secondary | ICD-10-CM | POA: Diagnosis not present

## 2014-12-04 DIAGNOSIS — E119 Type 2 diabetes mellitus without complications: Secondary | ICD-10-CM | POA: Diagnosis not present

## 2014-12-04 DIAGNOSIS — D509 Iron deficiency anemia, unspecified: Secondary | ICD-10-CM | POA: Diagnosis not present

## 2014-12-04 DIAGNOSIS — Z23 Encounter for immunization: Secondary | ICD-10-CM | POA: Diagnosis not present

## 2014-12-04 DIAGNOSIS — D631 Anemia in chronic kidney disease: Secondary | ICD-10-CM | POA: Diagnosis not present

## 2014-12-06 DIAGNOSIS — D631 Anemia in chronic kidney disease: Secondary | ICD-10-CM | POA: Diagnosis not present

## 2014-12-06 DIAGNOSIS — E119 Type 2 diabetes mellitus without complications: Secondary | ICD-10-CM | POA: Diagnosis not present

## 2014-12-06 DIAGNOSIS — Z23 Encounter for immunization: Secondary | ICD-10-CM | POA: Diagnosis not present

## 2014-12-06 DIAGNOSIS — N186 End stage renal disease: Secondary | ICD-10-CM | POA: Diagnosis not present

## 2014-12-06 DIAGNOSIS — D509 Iron deficiency anemia, unspecified: Secondary | ICD-10-CM | POA: Diagnosis not present

## 2014-12-08 DIAGNOSIS — D631 Anemia in chronic kidney disease: Secondary | ICD-10-CM | POA: Diagnosis not present

## 2014-12-08 DIAGNOSIS — N186 End stage renal disease: Secondary | ICD-10-CM | POA: Diagnosis not present

## 2014-12-08 DIAGNOSIS — D509 Iron deficiency anemia, unspecified: Secondary | ICD-10-CM | POA: Diagnosis not present

## 2014-12-08 DIAGNOSIS — E119 Type 2 diabetes mellitus without complications: Secondary | ICD-10-CM | POA: Diagnosis not present

## 2014-12-08 DIAGNOSIS — Z23 Encounter for immunization: Secondary | ICD-10-CM | POA: Diagnosis not present

## 2014-12-11 DIAGNOSIS — D631 Anemia in chronic kidney disease: Secondary | ICD-10-CM | POA: Diagnosis not present

## 2014-12-11 DIAGNOSIS — E119 Type 2 diabetes mellitus without complications: Secondary | ICD-10-CM | POA: Diagnosis not present

## 2014-12-11 DIAGNOSIS — D509 Iron deficiency anemia, unspecified: Secondary | ICD-10-CM | POA: Diagnosis not present

## 2014-12-11 DIAGNOSIS — N186 End stage renal disease: Secondary | ICD-10-CM | POA: Diagnosis not present

## 2014-12-11 DIAGNOSIS — Z23 Encounter for immunization: Secondary | ICD-10-CM | POA: Diagnosis not present

## 2014-12-13 DIAGNOSIS — E119 Type 2 diabetes mellitus without complications: Secondary | ICD-10-CM | POA: Diagnosis not present

## 2014-12-13 DIAGNOSIS — D631 Anemia in chronic kidney disease: Secondary | ICD-10-CM | POA: Diagnosis not present

## 2014-12-13 DIAGNOSIS — Z23 Encounter for immunization: Secondary | ICD-10-CM | POA: Diagnosis not present

## 2014-12-13 DIAGNOSIS — N186 End stage renal disease: Secondary | ICD-10-CM | POA: Diagnosis not present

## 2014-12-13 DIAGNOSIS — D509 Iron deficiency anemia, unspecified: Secondary | ICD-10-CM | POA: Diagnosis not present

## 2014-12-15 DIAGNOSIS — E119 Type 2 diabetes mellitus without complications: Secondary | ICD-10-CM | POA: Diagnosis not present

## 2014-12-15 DIAGNOSIS — D509 Iron deficiency anemia, unspecified: Secondary | ICD-10-CM | POA: Diagnosis not present

## 2014-12-15 DIAGNOSIS — D631 Anemia in chronic kidney disease: Secondary | ICD-10-CM | POA: Diagnosis not present

## 2014-12-15 DIAGNOSIS — Z23 Encounter for immunization: Secondary | ICD-10-CM | POA: Diagnosis not present

## 2014-12-15 DIAGNOSIS — N186 End stage renal disease: Secondary | ICD-10-CM | POA: Diagnosis not present

## 2014-12-18 DIAGNOSIS — D509 Iron deficiency anemia, unspecified: Secondary | ICD-10-CM | POA: Diagnosis not present

## 2014-12-18 DIAGNOSIS — N186 End stage renal disease: Secondary | ICD-10-CM | POA: Diagnosis not present

## 2014-12-18 DIAGNOSIS — E119 Type 2 diabetes mellitus without complications: Secondary | ICD-10-CM | POA: Diagnosis not present

## 2014-12-18 DIAGNOSIS — D631 Anemia in chronic kidney disease: Secondary | ICD-10-CM | POA: Diagnosis not present

## 2014-12-18 DIAGNOSIS — Z992 Dependence on renal dialysis: Secondary | ICD-10-CM | POA: Diagnosis not present

## 2014-12-18 DIAGNOSIS — Z23 Encounter for immunization: Secondary | ICD-10-CM | POA: Diagnosis not present

## 2014-12-18 DIAGNOSIS — E1129 Type 2 diabetes mellitus with other diabetic kidney complication: Secondary | ICD-10-CM | POA: Diagnosis not present

## 2014-12-20 DIAGNOSIS — D631 Anemia in chronic kidney disease: Secondary | ICD-10-CM | POA: Diagnosis not present

## 2014-12-20 DIAGNOSIS — E119 Type 2 diabetes mellitus without complications: Secondary | ICD-10-CM | POA: Diagnosis not present

## 2014-12-20 DIAGNOSIS — D509 Iron deficiency anemia, unspecified: Secondary | ICD-10-CM | POA: Diagnosis not present

## 2014-12-20 DIAGNOSIS — N186 End stage renal disease: Secondary | ICD-10-CM | POA: Diagnosis not present

## 2014-12-22 DIAGNOSIS — D631 Anemia in chronic kidney disease: Secondary | ICD-10-CM | POA: Diagnosis not present

## 2014-12-22 DIAGNOSIS — N186 End stage renal disease: Secondary | ICD-10-CM | POA: Diagnosis not present

## 2014-12-22 DIAGNOSIS — E119 Type 2 diabetes mellitus without complications: Secondary | ICD-10-CM | POA: Diagnosis not present

## 2014-12-22 DIAGNOSIS — D509 Iron deficiency anemia, unspecified: Secondary | ICD-10-CM | POA: Diagnosis not present

## 2014-12-25 DIAGNOSIS — D509 Iron deficiency anemia, unspecified: Secondary | ICD-10-CM | POA: Diagnosis not present

## 2014-12-25 DIAGNOSIS — E119 Type 2 diabetes mellitus without complications: Secondary | ICD-10-CM | POA: Diagnosis not present

## 2014-12-25 DIAGNOSIS — D631 Anemia in chronic kidney disease: Secondary | ICD-10-CM | POA: Diagnosis not present

## 2014-12-25 DIAGNOSIS — N186 End stage renal disease: Secondary | ICD-10-CM | POA: Diagnosis not present

## 2014-12-26 DIAGNOSIS — Z125 Encounter for screening for malignant neoplasm of prostate: Secondary | ICD-10-CM | POA: Diagnosis not present

## 2014-12-26 DIAGNOSIS — E1159 Type 2 diabetes mellitus with other circulatory complications: Secondary | ICD-10-CM | POA: Diagnosis not present

## 2014-12-26 DIAGNOSIS — E1165 Type 2 diabetes mellitus with hyperglycemia: Secondary | ICD-10-CM | POA: Diagnosis not present

## 2014-12-26 DIAGNOSIS — I10 Essential (primary) hypertension: Secondary | ICD-10-CM | POA: Diagnosis not present

## 2014-12-26 DIAGNOSIS — E782 Mixed hyperlipidemia: Secondary | ICD-10-CM | POA: Diagnosis not present

## 2014-12-26 DIAGNOSIS — E118 Type 2 diabetes mellitus with unspecified complications: Secondary | ICD-10-CM | POA: Diagnosis not present

## 2014-12-27 DIAGNOSIS — N186 End stage renal disease: Secondary | ICD-10-CM | POA: Diagnosis not present

## 2014-12-27 DIAGNOSIS — D509 Iron deficiency anemia, unspecified: Secondary | ICD-10-CM | POA: Diagnosis not present

## 2014-12-27 DIAGNOSIS — D631 Anemia in chronic kidney disease: Secondary | ICD-10-CM | POA: Diagnosis not present

## 2014-12-27 DIAGNOSIS — E119 Type 2 diabetes mellitus without complications: Secondary | ICD-10-CM | POA: Diagnosis not present

## 2014-12-29 DIAGNOSIS — N186 End stage renal disease: Secondary | ICD-10-CM | POA: Diagnosis not present

## 2014-12-29 DIAGNOSIS — E119 Type 2 diabetes mellitus without complications: Secondary | ICD-10-CM | POA: Diagnosis not present

## 2014-12-29 DIAGNOSIS — D509 Iron deficiency anemia, unspecified: Secondary | ICD-10-CM | POA: Diagnosis not present

## 2014-12-29 DIAGNOSIS — D631 Anemia in chronic kidney disease: Secondary | ICD-10-CM | POA: Diagnosis not present

## 2015-01-01 DIAGNOSIS — N186 End stage renal disease: Secondary | ICD-10-CM | POA: Diagnosis not present

## 2015-01-01 DIAGNOSIS — D509 Iron deficiency anemia, unspecified: Secondary | ICD-10-CM | POA: Diagnosis not present

## 2015-01-01 DIAGNOSIS — E119 Type 2 diabetes mellitus without complications: Secondary | ICD-10-CM | POA: Diagnosis not present

## 2015-01-01 DIAGNOSIS — D631 Anemia in chronic kidney disease: Secondary | ICD-10-CM | POA: Diagnosis not present

## 2015-01-02 DIAGNOSIS — R079 Chest pain, unspecified: Secondary | ICD-10-CM | POA: Diagnosis not present

## 2015-01-02 DIAGNOSIS — N186 End stage renal disease: Secondary | ICD-10-CM | POA: Diagnosis not present

## 2015-01-02 DIAGNOSIS — I130 Hypertensive heart and chronic kidney disease with heart failure and stage 1 through stage 4 chronic kidney disease, or unspecified chronic kidney disease: Secondary | ICD-10-CM | POA: Diagnosis not present

## 2015-01-02 DIAGNOSIS — I5022 Chronic systolic (congestive) heart failure: Secondary | ICD-10-CM | POA: Diagnosis not present

## 2015-01-03 DIAGNOSIS — D631 Anemia in chronic kidney disease: Secondary | ICD-10-CM | POA: Diagnosis not present

## 2015-01-03 DIAGNOSIS — N186 End stage renal disease: Secondary | ICD-10-CM | POA: Diagnosis not present

## 2015-01-03 DIAGNOSIS — E119 Type 2 diabetes mellitus without complications: Secondary | ICD-10-CM | POA: Diagnosis not present

## 2015-01-03 DIAGNOSIS — D509 Iron deficiency anemia, unspecified: Secondary | ICD-10-CM | POA: Diagnosis not present

## 2015-01-05 DIAGNOSIS — D509 Iron deficiency anemia, unspecified: Secondary | ICD-10-CM | POA: Diagnosis not present

## 2015-01-05 DIAGNOSIS — E119 Type 2 diabetes mellitus without complications: Secondary | ICD-10-CM | POA: Diagnosis not present

## 2015-01-05 DIAGNOSIS — N186 End stage renal disease: Secondary | ICD-10-CM | POA: Diagnosis not present

## 2015-01-05 DIAGNOSIS — D631 Anemia in chronic kidney disease: Secondary | ICD-10-CM | POA: Diagnosis not present

## 2015-01-08 DIAGNOSIS — D631 Anemia in chronic kidney disease: Secondary | ICD-10-CM | POA: Diagnosis not present

## 2015-01-08 DIAGNOSIS — N186 End stage renal disease: Secondary | ICD-10-CM | POA: Diagnosis not present

## 2015-01-08 DIAGNOSIS — D509 Iron deficiency anemia, unspecified: Secondary | ICD-10-CM | POA: Diagnosis not present

## 2015-01-08 DIAGNOSIS — E119 Type 2 diabetes mellitus without complications: Secondary | ICD-10-CM | POA: Diagnosis not present

## 2015-01-10 DIAGNOSIS — N186 End stage renal disease: Secondary | ICD-10-CM | POA: Diagnosis not present

## 2015-01-10 DIAGNOSIS — D509 Iron deficiency anemia, unspecified: Secondary | ICD-10-CM | POA: Diagnosis not present

## 2015-01-10 DIAGNOSIS — E119 Type 2 diabetes mellitus without complications: Secondary | ICD-10-CM | POA: Diagnosis not present

## 2015-01-10 DIAGNOSIS — D631 Anemia in chronic kidney disease: Secondary | ICD-10-CM | POA: Diagnosis not present

## 2015-01-11 NOTE — Patient Outreach (Signed)
[place] [facility]) Care Management  01/11/2015  Lucas Calderon [birth year] [id number]   Referral from [facility], assigned Joylene Draft, RN to outreach.  Lucas Calderon. [place], [place] Management [place] Assistant Phone: [phone] Fax: [phone]

## 2015-01-12 DIAGNOSIS — D509 Iron deficiency anemia, unspecified: Secondary | ICD-10-CM | POA: Diagnosis not present

## 2015-01-12 DIAGNOSIS — D631 Anemia in chronic kidney disease: Secondary | ICD-10-CM | POA: Diagnosis not present

## 2015-01-12 DIAGNOSIS — N186 End stage renal disease: Secondary | ICD-10-CM | POA: Diagnosis not present

## 2015-01-12 DIAGNOSIS — E119 Type 2 diabetes mellitus without complications: Secondary | ICD-10-CM | POA: Diagnosis not present

## 2015-01-15 DIAGNOSIS — D509 Iron deficiency anemia, unspecified: Secondary | ICD-10-CM | POA: Diagnosis not present

## 2015-01-15 DIAGNOSIS — E119 Type 2 diabetes mellitus without complications: Secondary | ICD-10-CM | POA: Diagnosis not present

## 2015-01-15 DIAGNOSIS — D631 Anemia in chronic kidney disease: Secondary | ICD-10-CM | POA: Diagnosis not present

## 2015-01-15 DIAGNOSIS — N186 End stage renal disease: Secondary | ICD-10-CM | POA: Diagnosis not present

## 2015-01-17 ENCOUNTER — Other Ambulatory Visit: Payer: Self-pay | Admitting: *Deleted

## 2015-01-17 DIAGNOSIS — Z992 Dependence on renal dialysis: Secondary | ICD-10-CM | POA: Diagnosis not present

## 2015-01-17 DIAGNOSIS — D509 Iron deficiency anemia, unspecified: Secondary | ICD-10-CM | POA: Diagnosis not present

## 2015-01-17 DIAGNOSIS — D631 Anemia in chronic kidney disease: Secondary | ICD-10-CM | POA: Diagnosis not present

## 2015-01-17 DIAGNOSIS — E119 Type 2 diabetes mellitus without complications: Secondary | ICD-10-CM | POA: Diagnosis not present

## 2015-01-17 DIAGNOSIS — N186 End stage renal disease: Secondary | ICD-10-CM | POA: Diagnosis not present

## 2015-01-17 DIAGNOSIS — E1129 Type 2 diabetes mellitus with other diabetic kidney complication: Secondary | ICD-10-CM | POA: Diagnosis not present

## 2015-01-17 NOTE — Patient Outreach (Signed)
[place] [facility]) Care Management  01/17/2015  Lucas Calderon [birth year] [id number]   High risk screening, Telephone call to reach patient to discuss [facility] services unsuccessful, no answer. Will try back at a later date.  Joylene Draft, RN, [place] Care Management [phone]

## 2015-01-18 ENCOUNTER — Other Ambulatory Visit: Payer: Self-pay | Admitting: *Deleted

## 2015-01-18 NOTE — Patient Outreach (Signed)
[facility]) Care Management  01/18/2015  Jarrad Kilcullen [birth year] [id number]  High Risk Screening Telephone Outreach  Attempted to reach patient to discuss [place], his sister Aileen Fass answered the phone Mr.Gilkison is not available at this time. I will reach out to the patient at a later date.  Joylene Draft, RN, [place] Care Management [phone]

## 2015-01-19 DIAGNOSIS — D631 Anemia in chronic kidney disease: Secondary | ICD-10-CM | POA: Diagnosis not present

## 2015-01-19 DIAGNOSIS — E119 Type 2 diabetes mellitus without complications: Secondary | ICD-10-CM | POA: Diagnosis not present

## 2015-01-19 DIAGNOSIS — N186 End stage renal disease: Secondary | ICD-10-CM | POA: Diagnosis not present

## 2015-01-19 DIAGNOSIS — D509 Iron deficiency anemia, unspecified: Secondary | ICD-10-CM | POA: Diagnosis not present

## 2015-01-22 DIAGNOSIS — D631 Anemia in chronic kidney disease: Secondary | ICD-10-CM | POA: Diagnosis not present

## 2015-01-22 DIAGNOSIS — D509 Iron deficiency anemia, unspecified: Secondary | ICD-10-CM | POA: Diagnosis not present

## 2015-01-22 DIAGNOSIS — N186 End stage renal disease: Secondary | ICD-10-CM | POA: Diagnosis not present

## 2015-01-22 DIAGNOSIS — E119 Type 2 diabetes mellitus without complications: Secondary | ICD-10-CM | POA: Diagnosis not present

## 2015-01-23 ENCOUNTER — Other Ambulatory Visit: Payer: Self-pay | Admitting: *Deleted

## 2015-01-23 NOTE — Patient Outreach (Signed)
[facility]) Care Management  01/23/2015  Lucas Calderon Jul 11, 1941 [id number]   High Risk List Screening  3rd outreach attempt to contact patient regarding [place] Management services. Will discuss with [name] team regarding next outreach plan.  Joylene Draft, RN, [place] Care Management [phone]

## 2015-01-24 DIAGNOSIS — D631 Anemia in chronic kidney disease: Secondary | ICD-10-CM | POA: Diagnosis not present

## 2015-01-24 DIAGNOSIS — D509 Iron deficiency anemia, unspecified: Secondary | ICD-10-CM | POA: Diagnosis not present

## 2015-01-24 DIAGNOSIS — E119 Type 2 diabetes mellitus without complications: Secondary | ICD-10-CM | POA: Diagnosis not present

## 2015-01-24 DIAGNOSIS — N186 End stage renal disease: Secondary | ICD-10-CM | POA: Diagnosis not present

## 2015-01-26 DIAGNOSIS — E119 Type 2 diabetes mellitus without complications: Secondary | ICD-10-CM | POA: Diagnosis not present

## 2015-01-26 DIAGNOSIS — D509 Iron deficiency anemia, unspecified: Secondary | ICD-10-CM | POA: Diagnosis not present

## 2015-01-26 DIAGNOSIS — N186 End stage renal disease: Secondary | ICD-10-CM | POA: Diagnosis not present

## 2015-01-26 DIAGNOSIS — D631 Anemia in chronic kidney disease: Secondary | ICD-10-CM | POA: Diagnosis not present

## 2015-01-29 DIAGNOSIS — E119 Type 2 diabetes mellitus without complications: Secondary | ICD-10-CM | POA: Diagnosis not present

## 2015-01-29 DIAGNOSIS — N186 End stage renal disease: Secondary | ICD-10-CM | POA: Diagnosis not present

## 2015-01-29 DIAGNOSIS — D509 Iron deficiency anemia, unspecified: Secondary | ICD-10-CM | POA: Diagnosis not present

## 2015-01-29 DIAGNOSIS — D631 Anemia in chronic kidney disease: Secondary | ICD-10-CM | POA: Diagnosis not present

## 2015-01-31 DIAGNOSIS — E119 Type 2 diabetes mellitus without complications: Secondary | ICD-10-CM | POA: Diagnosis not present

## 2015-01-31 DIAGNOSIS — D631 Anemia in chronic kidney disease: Secondary | ICD-10-CM | POA: Diagnosis not present

## 2015-01-31 DIAGNOSIS — N186 End stage renal disease: Secondary | ICD-10-CM | POA: Diagnosis not present

## 2015-01-31 DIAGNOSIS — D509 Iron deficiency anemia, unspecified: Secondary | ICD-10-CM | POA: Diagnosis not present

## 2015-02-02 DIAGNOSIS — E119 Type 2 diabetes mellitus without complications: Secondary | ICD-10-CM | POA: Diagnosis not present

## 2015-02-02 DIAGNOSIS — D631 Anemia in chronic kidney disease: Secondary | ICD-10-CM | POA: Diagnosis not present

## 2015-02-02 DIAGNOSIS — N186 End stage renal disease: Secondary | ICD-10-CM | POA: Diagnosis not present

## 2015-02-02 DIAGNOSIS — D509 Iron deficiency anemia, unspecified: Secondary | ICD-10-CM | POA: Diagnosis not present

## 2015-02-05 DIAGNOSIS — D509 Iron deficiency anemia, unspecified: Secondary | ICD-10-CM | POA: Diagnosis not present

## 2015-02-05 DIAGNOSIS — N186 End stage renal disease: Secondary | ICD-10-CM | POA: Diagnosis not present

## 2015-02-05 DIAGNOSIS — D631 Anemia in chronic kidney disease: Secondary | ICD-10-CM | POA: Diagnosis not present

## 2015-02-05 DIAGNOSIS — E119 Type 2 diabetes mellitus without complications: Secondary | ICD-10-CM | POA: Diagnosis not present

## 2015-02-06 ENCOUNTER — Other Ambulatory Visit: Payer: Self-pay | Admitting: *Deleted

## 2015-02-06 NOTE — Patient Outreach (Addendum)
[place] [facility]) Care Management  02/06/2015  Lucas Calderon [birth year] [id number]   High Risk List Screening Call  4th Unsuccessful telephone outreach to contact patient regarding [place] management services. Will alert Lurline Del CMA.  Joylene Draft, RN, [place] Care Management [phone]

## 2015-02-07 DIAGNOSIS — N186 End stage renal disease: Secondary | ICD-10-CM | POA: Diagnosis not present

## 2015-02-07 DIAGNOSIS — D509 Iron deficiency anemia, unspecified: Secondary | ICD-10-CM | POA: Diagnosis not present

## 2015-02-07 DIAGNOSIS — D631 Anemia in chronic kidney disease: Secondary | ICD-10-CM | POA: Diagnosis not present

## 2015-02-07 DIAGNOSIS — E119 Type 2 diabetes mellitus without complications: Secondary | ICD-10-CM | POA: Diagnosis not present

## 2015-02-09 DIAGNOSIS — D509 Iron deficiency anemia, unspecified: Secondary | ICD-10-CM | POA: Diagnosis not present

## 2015-02-09 DIAGNOSIS — E119 Type 2 diabetes mellitus without complications: Secondary | ICD-10-CM | POA: Diagnosis not present

## 2015-02-09 DIAGNOSIS — D631 Anemia in chronic kidney disease: Secondary | ICD-10-CM | POA: Diagnosis not present

## 2015-02-09 DIAGNOSIS — N186 End stage renal disease: Secondary | ICD-10-CM | POA: Diagnosis not present

## 2015-02-12 DIAGNOSIS — N186 End stage renal disease: Secondary | ICD-10-CM | POA: Diagnosis not present

## 2015-02-12 DIAGNOSIS — D509 Iron deficiency anemia, unspecified: Secondary | ICD-10-CM | POA: Diagnosis not present

## 2015-02-12 DIAGNOSIS — E119 Type 2 diabetes mellitus without complications: Secondary | ICD-10-CM | POA: Diagnosis not present

## 2015-02-12 DIAGNOSIS — D631 Anemia in chronic kidney disease: Secondary | ICD-10-CM | POA: Diagnosis not present

## 2015-02-14 ENCOUNTER — Encounter: Payer: Self-pay | Admitting: *Deleted

## 2015-02-14 DIAGNOSIS — N186 End stage renal disease: Secondary | ICD-10-CM | POA: Diagnosis not present

## 2015-02-14 DIAGNOSIS — D509 Iron deficiency anemia, unspecified: Secondary | ICD-10-CM | POA: Diagnosis not present

## 2015-02-14 DIAGNOSIS — D631 Anemia in chronic kidney disease: Secondary | ICD-10-CM | POA: Diagnosis not present

## 2015-02-14 DIAGNOSIS — E119 Type 2 diabetes mellitus without complications: Secondary | ICD-10-CM | POA: Diagnosis not present

## 2015-02-14 NOTE — Patient Outreach (Signed)
[place] [facility]) Care Management  02/14/2015  Lucas Calderon [birth year] [id number]  On several attempts have been  unsuccessful in contacting the patient to offer [facility] care management services. I have sent a  case closure letter to primary care provider.  Joylene Draft, RN, [place] Care Management [phone]

## 2015-02-16 DIAGNOSIS — D631 Anemia in chronic kidney disease: Secondary | ICD-10-CM | POA: Diagnosis not present

## 2015-02-16 DIAGNOSIS — E119 Type 2 diabetes mellitus without complications: Secondary | ICD-10-CM | POA: Diagnosis not present

## 2015-02-16 DIAGNOSIS — D509 Iron deficiency anemia, unspecified: Secondary | ICD-10-CM | POA: Diagnosis not present

## 2015-02-16 DIAGNOSIS — N186 End stage renal disease: Secondary | ICD-10-CM | POA: Diagnosis not present

## 2015-02-17 DIAGNOSIS — N186 End stage renal disease: Secondary | ICD-10-CM | POA: Diagnosis not present

## 2015-02-17 DIAGNOSIS — Z992 Dependence on renal dialysis: Secondary | ICD-10-CM | POA: Diagnosis not present

## 2015-02-17 DIAGNOSIS — E1129 Type 2 diabetes mellitus with other diabetic kidney complication: Secondary | ICD-10-CM | POA: Diagnosis not present

## 2015-02-19 DIAGNOSIS — D631 Anemia in chronic kidney disease: Secondary | ICD-10-CM | POA: Diagnosis not present

## 2015-02-19 DIAGNOSIS — E119 Type 2 diabetes mellitus without complications: Secondary | ICD-10-CM | POA: Diagnosis not present

## 2015-02-19 DIAGNOSIS — N186 End stage renal disease: Secondary | ICD-10-CM | POA: Diagnosis not present

## 2015-02-21 DIAGNOSIS — N186 End stage renal disease: Secondary | ICD-10-CM | POA: Diagnosis not present

## 2015-02-21 DIAGNOSIS — E119 Type 2 diabetes mellitus without complications: Secondary | ICD-10-CM | POA: Diagnosis not present

## 2015-02-21 DIAGNOSIS — D631 Anemia in chronic kidney disease: Secondary | ICD-10-CM | POA: Diagnosis not present

## 2015-02-23 DIAGNOSIS — N186 End stage renal disease: Secondary | ICD-10-CM | POA: Diagnosis not present

## 2015-02-23 DIAGNOSIS — E119 Type 2 diabetes mellitus without complications: Secondary | ICD-10-CM | POA: Diagnosis not present

## 2015-02-23 DIAGNOSIS — D631 Anemia in chronic kidney disease: Secondary | ICD-10-CM | POA: Diagnosis not present

## 2015-02-26 DIAGNOSIS — D631 Anemia in chronic kidney disease: Secondary | ICD-10-CM | POA: Diagnosis not present

## 2015-02-26 DIAGNOSIS — N186 End stage renal disease: Secondary | ICD-10-CM | POA: Diagnosis not present

## 2015-02-26 DIAGNOSIS — E119 Type 2 diabetes mellitus without complications: Secondary | ICD-10-CM | POA: Diagnosis not present

## 2015-02-28 DIAGNOSIS — D631 Anemia in chronic kidney disease: Secondary | ICD-10-CM | POA: Diagnosis not present

## 2015-02-28 DIAGNOSIS — E119 Type 2 diabetes mellitus without complications: Secondary | ICD-10-CM | POA: Diagnosis not present

## 2015-02-28 DIAGNOSIS — N186 End stage renal disease: Secondary | ICD-10-CM | POA: Diagnosis not present

## 2015-03-02 DIAGNOSIS — D631 Anemia in chronic kidney disease: Secondary | ICD-10-CM | POA: Diagnosis not present

## 2015-03-02 DIAGNOSIS — E119 Type 2 diabetes mellitus without complications: Secondary | ICD-10-CM | POA: Diagnosis not present

## 2015-03-02 DIAGNOSIS — N186 End stage renal disease: Secondary | ICD-10-CM | POA: Diagnosis not present

## 2015-03-05 DIAGNOSIS — D631 Anemia in chronic kidney disease: Secondary | ICD-10-CM | POA: Diagnosis not present

## 2015-03-05 DIAGNOSIS — N186 End stage renal disease: Secondary | ICD-10-CM | POA: Diagnosis not present

## 2015-03-05 DIAGNOSIS — E119 Type 2 diabetes mellitus without complications: Secondary | ICD-10-CM | POA: Diagnosis not present

## 2015-03-07 DIAGNOSIS — D631 Anemia in chronic kidney disease: Secondary | ICD-10-CM | POA: Diagnosis not present

## 2015-03-07 DIAGNOSIS — N186 End stage renal disease: Secondary | ICD-10-CM | POA: Diagnosis not present

## 2015-03-07 DIAGNOSIS — E119 Type 2 diabetes mellitus without complications: Secondary | ICD-10-CM | POA: Diagnosis not present

## 2015-03-09 DIAGNOSIS — E119 Type 2 diabetes mellitus without complications: Secondary | ICD-10-CM | POA: Diagnosis not present

## 2015-03-09 DIAGNOSIS — N186 End stage renal disease: Secondary | ICD-10-CM | POA: Diagnosis not present

## 2015-03-09 DIAGNOSIS — D631 Anemia in chronic kidney disease: Secondary | ICD-10-CM | POA: Diagnosis not present

## 2015-03-12 DIAGNOSIS — D631 Anemia in chronic kidney disease: Secondary | ICD-10-CM | POA: Diagnosis not present

## 2015-03-12 DIAGNOSIS — N186 End stage renal disease: Secondary | ICD-10-CM | POA: Diagnosis not present

## 2015-03-12 DIAGNOSIS — E119 Type 2 diabetes mellitus without complications: Secondary | ICD-10-CM | POA: Diagnosis not present

## 2015-03-14 DIAGNOSIS — N186 End stage renal disease: Secondary | ICD-10-CM | POA: Diagnosis not present

## 2015-03-14 DIAGNOSIS — D631 Anemia in chronic kidney disease: Secondary | ICD-10-CM | POA: Diagnosis not present

## 2015-03-14 DIAGNOSIS — E119 Type 2 diabetes mellitus without complications: Secondary | ICD-10-CM | POA: Diagnosis not present

## 2015-03-16 DIAGNOSIS — N186 End stage renal disease: Secondary | ICD-10-CM | POA: Diagnosis not present

## 2015-03-16 DIAGNOSIS — E119 Type 2 diabetes mellitus without complications: Secondary | ICD-10-CM | POA: Diagnosis not present

## 2015-03-16 DIAGNOSIS — D631 Anemia in chronic kidney disease: Secondary | ICD-10-CM | POA: Diagnosis not present

## 2015-03-19 DIAGNOSIS — D631 Anemia in chronic kidney disease: Secondary | ICD-10-CM | POA: Diagnosis not present

## 2015-03-19 DIAGNOSIS — N186 End stage renal disease: Secondary | ICD-10-CM | POA: Diagnosis not present

## 2015-03-19 DIAGNOSIS — E119 Type 2 diabetes mellitus without complications: Secondary | ICD-10-CM | POA: Diagnosis not present

## 2015-03-20 DIAGNOSIS — Z992 Dependence on renal dialysis: Secondary | ICD-10-CM | POA: Diagnosis not present

## 2015-03-20 DIAGNOSIS — N186 End stage renal disease: Secondary | ICD-10-CM | POA: Diagnosis not present

## 2015-03-20 DIAGNOSIS — E1129 Type 2 diabetes mellitus with other diabetic kidney complication: Secondary | ICD-10-CM | POA: Diagnosis not present

## 2015-03-21 DIAGNOSIS — E119 Type 2 diabetes mellitus without complications: Secondary | ICD-10-CM | POA: Diagnosis not present

## 2015-03-21 DIAGNOSIS — N186 End stage renal disease: Secondary | ICD-10-CM | POA: Diagnosis not present

## 2015-03-21 DIAGNOSIS — D509 Iron deficiency anemia, unspecified: Secondary | ICD-10-CM | POA: Diagnosis not present

## 2015-03-21 DIAGNOSIS — Z23 Encounter for immunization: Secondary | ICD-10-CM | POA: Diagnosis not present

## 2015-03-21 DIAGNOSIS — D631 Anemia in chronic kidney disease: Secondary | ICD-10-CM | POA: Diagnosis not present

## 2015-03-23 DIAGNOSIS — E119 Type 2 diabetes mellitus without complications: Secondary | ICD-10-CM | POA: Diagnosis not present

## 2015-03-23 DIAGNOSIS — D631 Anemia in chronic kidney disease: Secondary | ICD-10-CM | POA: Diagnosis not present

## 2015-03-23 DIAGNOSIS — Z23 Encounter for immunization: Secondary | ICD-10-CM | POA: Diagnosis not present

## 2015-03-23 DIAGNOSIS — N186 End stage renal disease: Secondary | ICD-10-CM | POA: Diagnosis not present

## 2015-03-23 DIAGNOSIS — D509 Iron deficiency anemia, unspecified: Secondary | ICD-10-CM | POA: Diagnosis not present

## 2015-03-26 DIAGNOSIS — Z23 Encounter for immunization: Secondary | ICD-10-CM | POA: Diagnosis not present

## 2015-03-26 DIAGNOSIS — E119 Type 2 diabetes mellitus without complications: Secondary | ICD-10-CM | POA: Diagnosis not present

## 2015-03-26 DIAGNOSIS — N186 End stage renal disease: Secondary | ICD-10-CM | POA: Diagnosis not present

## 2015-03-26 DIAGNOSIS — D509 Iron deficiency anemia, unspecified: Secondary | ICD-10-CM | POA: Diagnosis not present

## 2015-03-26 DIAGNOSIS — D631 Anemia in chronic kidney disease: Secondary | ICD-10-CM | POA: Diagnosis not present

## 2015-03-28 DIAGNOSIS — D631 Anemia in chronic kidney disease: Secondary | ICD-10-CM | POA: Diagnosis not present

## 2015-03-28 DIAGNOSIS — E119 Type 2 diabetes mellitus without complications: Secondary | ICD-10-CM | POA: Diagnosis not present

## 2015-03-28 DIAGNOSIS — N186 End stage renal disease: Secondary | ICD-10-CM | POA: Diagnosis not present

## 2015-03-28 DIAGNOSIS — Z23 Encounter for immunization: Secondary | ICD-10-CM | POA: Diagnosis not present

## 2015-03-28 DIAGNOSIS — D509 Iron deficiency anemia, unspecified: Secondary | ICD-10-CM | POA: Diagnosis not present

## 2015-03-29 DIAGNOSIS — I10 Essential (primary) hypertension: Secondary | ICD-10-CM | POA: Diagnosis not present

## 2015-03-29 DIAGNOSIS — E1165 Type 2 diabetes mellitus with hyperglycemia: Secondary | ICD-10-CM | POA: Diagnosis not present

## 2015-03-29 DIAGNOSIS — E1129 Type 2 diabetes mellitus with other diabetic kidney complication: Secondary | ICD-10-CM | POA: Diagnosis not present

## 2015-03-29 DIAGNOSIS — E782 Mixed hyperlipidemia: Secondary | ICD-10-CM | POA: Diagnosis not present

## 2015-03-29 DIAGNOSIS — E1159 Type 2 diabetes mellitus with other circulatory complications: Secondary | ICD-10-CM | POA: Diagnosis not present

## 2015-03-29 DIAGNOSIS — E118 Type 2 diabetes mellitus with unspecified complications: Secondary | ICD-10-CM | POA: Diagnosis not present

## 2015-03-30 DIAGNOSIS — D509 Iron deficiency anemia, unspecified: Secondary | ICD-10-CM | POA: Diagnosis not present

## 2015-03-30 DIAGNOSIS — Z23 Encounter for immunization: Secondary | ICD-10-CM | POA: Diagnosis not present

## 2015-03-30 DIAGNOSIS — D631 Anemia in chronic kidney disease: Secondary | ICD-10-CM | POA: Diagnosis not present

## 2015-03-30 DIAGNOSIS — N186 End stage renal disease: Secondary | ICD-10-CM | POA: Diagnosis not present

## 2015-03-30 DIAGNOSIS — E119 Type 2 diabetes mellitus without complications: Secondary | ICD-10-CM | POA: Diagnosis not present

## 2015-04-02 DIAGNOSIS — Z23 Encounter for immunization: Secondary | ICD-10-CM | POA: Diagnosis not present

## 2015-04-02 DIAGNOSIS — D509 Iron deficiency anemia, unspecified: Secondary | ICD-10-CM | POA: Diagnosis not present

## 2015-04-02 DIAGNOSIS — D631 Anemia in chronic kidney disease: Secondary | ICD-10-CM | POA: Diagnosis not present

## 2015-04-02 DIAGNOSIS — N186 End stage renal disease: Secondary | ICD-10-CM | POA: Diagnosis not present

## 2015-04-02 DIAGNOSIS — E119 Type 2 diabetes mellitus without complications: Secondary | ICD-10-CM | POA: Diagnosis not present

## 2015-04-04 DIAGNOSIS — N186 End stage renal disease: Secondary | ICD-10-CM | POA: Diagnosis not present

## 2015-04-04 DIAGNOSIS — Z23 Encounter for immunization: Secondary | ICD-10-CM | POA: Diagnosis not present

## 2015-04-04 DIAGNOSIS — D509 Iron deficiency anemia, unspecified: Secondary | ICD-10-CM | POA: Diagnosis not present

## 2015-04-04 DIAGNOSIS — E119 Type 2 diabetes mellitus without complications: Secondary | ICD-10-CM | POA: Diagnosis not present

## 2015-04-04 DIAGNOSIS — D631 Anemia in chronic kidney disease: Secondary | ICD-10-CM | POA: Diagnosis not present

## 2015-04-06 DIAGNOSIS — Z23 Encounter for immunization: Secondary | ICD-10-CM | POA: Diagnosis not present

## 2015-04-06 DIAGNOSIS — N186 End stage renal disease: Secondary | ICD-10-CM | POA: Diagnosis not present

## 2015-04-06 DIAGNOSIS — E119 Type 2 diabetes mellitus without complications: Secondary | ICD-10-CM | POA: Diagnosis not present

## 2015-04-06 DIAGNOSIS — D631 Anemia in chronic kidney disease: Secondary | ICD-10-CM | POA: Diagnosis not present

## 2015-04-06 DIAGNOSIS — D509 Iron deficiency anemia, unspecified: Secondary | ICD-10-CM | POA: Diagnosis not present

## 2015-04-09 DIAGNOSIS — D631 Anemia in chronic kidney disease: Secondary | ICD-10-CM | POA: Diagnosis not present

## 2015-04-09 DIAGNOSIS — N186 End stage renal disease: Secondary | ICD-10-CM | POA: Diagnosis not present

## 2015-04-09 DIAGNOSIS — Z23 Encounter for immunization: Secondary | ICD-10-CM | POA: Diagnosis not present

## 2015-04-09 DIAGNOSIS — D509 Iron deficiency anemia, unspecified: Secondary | ICD-10-CM | POA: Diagnosis not present

## 2015-04-09 DIAGNOSIS — E119 Type 2 diabetes mellitus without complications: Secondary | ICD-10-CM | POA: Diagnosis not present

## 2015-04-11 DIAGNOSIS — D509 Iron deficiency anemia, unspecified: Secondary | ICD-10-CM | POA: Diagnosis not present

## 2015-04-11 DIAGNOSIS — N186 End stage renal disease: Secondary | ICD-10-CM | POA: Diagnosis not present

## 2015-04-11 DIAGNOSIS — Z23 Encounter for immunization: Secondary | ICD-10-CM | POA: Diagnosis not present

## 2015-04-11 DIAGNOSIS — E119 Type 2 diabetes mellitus without complications: Secondary | ICD-10-CM | POA: Diagnosis not present

## 2015-04-11 DIAGNOSIS — D631 Anemia in chronic kidney disease: Secondary | ICD-10-CM | POA: Diagnosis not present

## 2015-04-13 DIAGNOSIS — E119 Type 2 diabetes mellitus without complications: Secondary | ICD-10-CM | POA: Diagnosis not present

## 2015-04-13 DIAGNOSIS — D631 Anemia in chronic kidney disease: Secondary | ICD-10-CM | POA: Diagnosis not present

## 2015-04-13 DIAGNOSIS — Z23 Encounter for immunization: Secondary | ICD-10-CM | POA: Diagnosis not present

## 2015-04-13 DIAGNOSIS — N186 End stage renal disease: Secondary | ICD-10-CM | POA: Diagnosis not present

## 2015-04-13 DIAGNOSIS — D509 Iron deficiency anemia, unspecified: Secondary | ICD-10-CM | POA: Diagnosis not present

## 2015-04-16 DIAGNOSIS — D631 Anemia in chronic kidney disease: Secondary | ICD-10-CM | POA: Diagnosis not present

## 2015-04-16 DIAGNOSIS — D509 Iron deficiency anemia, unspecified: Secondary | ICD-10-CM | POA: Diagnosis not present

## 2015-04-16 DIAGNOSIS — Z23 Encounter for immunization: Secondary | ICD-10-CM | POA: Diagnosis not present

## 2015-04-16 DIAGNOSIS — N186 End stage renal disease: Secondary | ICD-10-CM | POA: Diagnosis not present

## 2015-04-16 DIAGNOSIS — E119 Type 2 diabetes mellitus without complications: Secondary | ICD-10-CM | POA: Diagnosis not present

## 2015-04-18 DIAGNOSIS — D631 Anemia in chronic kidney disease: Secondary | ICD-10-CM | POA: Diagnosis not present

## 2015-04-18 DIAGNOSIS — N186 End stage renal disease: Secondary | ICD-10-CM | POA: Diagnosis not present

## 2015-04-18 DIAGNOSIS — D509 Iron deficiency anemia, unspecified: Secondary | ICD-10-CM | POA: Diagnosis not present

## 2015-04-18 DIAGNOSIS — Z23 Encounter for immunization: Secondary | ICD-10-CM | POA: Diagnosis not present

## 2015-04-18 DIAGNOSIS — E119 Type 2 diabetes mellitus without complications: Secondary | ICD-10-CM | POA: Diagnosis not present

## 2015-04-19 DIAGNOSIS — Z992 Dependence on renal dialysis: Secondary | ICD-10-CM | POA: Diagnosis not present

## 2015-04-19 DIAGNOSIS — E1129 Type 2 diabetes mellitus with other diabetic kidney complication: Secondary | ICD-10-CM | POA: Diagnosis not present

## 2015-04-19 DIAGNOSIS — N186 End stage renal disease: Secondary | ICD-10-CM | POA: Diagnosis not present

## 2015-04-20 DIAGNOSIS — E119 Type 2 diabetes mellitus without complications: Secondary | ICD-10-CM | POA: Diagnosis not present

## 2015-04-20 DIAGNOSIS — D631 Anemia in chronic kidney disease: Secondary | ICD-10-CM | POA: Diagnosis not present

## 2015-04-20 DIAGNOSIS — Z23 Encounter for immunization: Secondary | ICD-10-CM | POA: Diagnosis not present

## 2015-04-20 DIAGNOSIS — N186 End stage renal disease: Secondary | ICD-10-CM | POA: Diagnosis not present

## 2015-04-23 DIAGNOSIS — D631 Anemia in chronic kidney disease: Secondary | ICD-10-CM | POA: Diagnosis not present

## 2015-04-23 DIAGNOSIS — N186 End stage renal disease: Secondary | ICD-10-CM | POA: Diagnosis not present

## 2015-04-23 DIAGNOSIS — E119 Type 2 diabetes mellitus without complications: Secondary | ICD-10-CM | POA: Diagnosis not present

## 2015-04-23 DIAGNOSIS — Z23 Encounter for immunization: Secondary | ICD-10-CM | POA: Diagnosis not present

## 2015-04-25 DIAGNOSIS — E119 Type 2 diabetes mellitus without complications: Secondary | ICD-10-CM | POA: Diagnosis not present

## 2015-04-25 DIAGNOSIS — D631 Anemia in chronic kidney disease: Secondary | ICD-10-CM | POA: Diagnosis not present

## 2015-04-25 DIAGNOSIS — N186 End stage renal disease: Secondary | ICD-10-CM | POA: Diagnosis not present

## 2015-04-25 DIAGNOSIS — Z23 Encounter for immunization: Secondary | ICD-10-CM | POA: Diagnosis not present

## 2015-04-27 DIAGNOSIS — Z23 Encounter for immunization: Secondary | ICD-10-CM | POA: Diagnosis not present

## 2015-04-27 DIAGNOSIS — E119 Type 2 diabetes mellitus without complications: Secondary | ICD-10-CM | POA: Diagnosis not present

## 2015-04-27 DIAGNOSIS — N186 End stage renal disease: Secondary | ICD-10-CM | POA: Diagnosis not present

## 2015-04-27 DIAGNOSIS — D631 Anemia in chronic kidney disease: Secondary | ICD-10-CM | POA: Diagnosis not present

## 2015-04-30 DIAGNOSIS — E119 Type 2 diabetes mellitus without complications: Secondary | ICD-10-CM | POA: Diagnosis not present

## 2015-04-30 DIAGNOSIS — N186 End stage renal disease: Secondary | ICD-10-CM | POA: Diagnosis not present

## 2015-04-30 DIAGNOSIS — Z23 Encounter for immunization: Secondary | ICD-10-CM | POA: Diagnosis not present

## 2015-04-30 DIAGNOSIS — D631 Anemia in chronic kidney disease: Secondary | ICD-10-CM | POA: Diagnosis not present

## 2015-05-02 DIAGNOSIS — N186 End stage renal disease: Secondary | ICD-10-CM | POA: Diagnosis not present

## 2015-05-02 DIAGNOSIS — Z23 Encounter for immunization: Secondary | ICD-10-CM | POA: Diagnosis not present

## 2015-05-02 DIAGNOSIS — E119 Type 2 diabetes mellitus without complications: Secondary | ICD-10-CM | POA: Diagnosis not present

## 2015-05-02 DIAGNOSIS — D631 Anemia in chronic kidney disease: Secondary | ICD-10-CM | POA: Diagnosis not present

## 2015-05-04 DIAGNOSIS — Z23 Encounter for immunization: Secondary | ICD-10-CM | POA: Diagnosis not present

## 2015-05-04 DIAGNOSIS — N186 End stage renal disease: Secondary | ICD-10-CM | POA: Diagnosis not present

## 2015-05-04 DIAGNOSIS — D631 Anemia in chronic kidney disease: Secondary | ICD-10-CM | POA: Diagnosis not present

## 2015-05-04 DIAGNOSIS — E119 Type 2 diabetes mellitus without complications: Secondary | ICD-10-CM | POA: Diagnosis not present

## 2015-05-07 DIAGNOSIS — E119 Type 2 diabetes mellitus without complications: Secondary | ICD-10-CM | POA: Diagnosis not present

## 2015-05-07 DIAGNOSIS — D631 Anemia in chronic kidney disease: Secondary | ICD-10-CM | POA: Diagnosis not present

## 2015-05-07 DIAGNOSIS — Z23 Encounter for immunization: Secondary | ICD-10-CM | POA: Diagnosis not present

## 2015-05-07 DIAGNOSIS — N186 End stage renal disease: Secondary | ICD-10-CM | POA: Diagnosis not present

## 2015-05-09 DIAGNOSIS — D631 Anemia in chronic kidney disease: Secondary | ICD-10-CM | POA: Diagnosis not present

## 2015-05-09 DIAGNOSIS — N186 End stage renal disease: Secondary | ICD-10-CM | POA: Diagnosis not present

## 2015-05-09 DIAGNOSIS — Z23 Encounter for immunization: Secondary | ICD-10-CM | POA: Diagnosis not present

## 2015-05-09 DIAGNOSIS — E119 Type 2 diabetes mellitus without complications: Secondary | ICD-10-CM | POA: Diagnosis not present

## 2015-05-11 DIAGNOSIS — Z23 Encounter for immunization: Secondary | ICD-10-CM | POA: Diagnosis not present

## 2015-05-11 DIAGNOSIS — E119 Type 2 diabetes mellitus without complications: Secondary | ICD-10-CM | POA: Diagnosis not present

## 2015-05-11 DIAGNOSIS — D631 Anemia in chronic kidney disease: Secondary | ICD-10-CM | POA: Diagnosis not present

## 2015-05-11 DIAGNOSIS — N186 End stage renal disease: Secondary | ICD-10-CM | POA: Diagnosis not present

## 2015-05-14 DIAGNOSIS — Z23 Encounter for immunization: Secondary | ICD-10-CM | POA: Diagnosis not present

## 2015-05-14 DIAGNOSIS — N186 End stage renal disease: Secondary | ICD-10-CM | POA: Diagnosis not present

## 2015-05-14 DIAGNOSIS — E119 Type 2 diabetes mellitus without complications: Secondary | ICD-10-CM | POA: Diagnosis not present

## 2015-05-14 DIAGNOSIS — D631 Anemia in chronic kidney disease: Secondary | ICD-10-CM | POA: Diagnosis not present

## 2015-05-16 DIAGNOSIS — E119 Type 2 diabetes mellitus without complications: Secondary | ICD-10-CM | POA: Diagnosis not present

## 2015-05-16 DIAGNOSIS — N186 End stage renal disease: Secondary | ICD-10-CM | POA: Diagnosis not present

## 2015-05-16 DIAGNOSIS — Z23 Encounter for immunization: Secondary | ICD-10-CM | POA: Diagnosis not present

## 2015-05-16 DIAGNOSIS — D631 Anemia in chronic kidney disease: Secondary | ICD-10-CM | POA: Diagnosis not present

## 2015-05-18 DIAGNOSIS — Z23 Encounter for immunization: Secondary | ICD-10-CM | POA: Diagnosis not present

## 2015-05-18 DIAGNOSIS — N186 End stage renal disease: Secondary | ICD-10-CM | POA: Diagnosis not present

## 2015-05-18 DIAGNOSIS — D631 Anemia in chronic kidney disease: Secondary | ICD-10-CM | POA: Diagnosis not present

## 2015-05-18 DIAGNOSIS — E119 Type 2 diabetes mellitus without complications: Secondary | ICD-10-CM | POA: Diagnosis not present

## 2015-05-20 DIAGNOSIS — E1129 Type 2 diabetes mellitus with other diabetic kidney complication: Secondary | ICD-10-CM | POA: Diagnosis not present

## 2015-05-20 DIAGNOSIS — N186 End stage renal disease: Secondary | ICD-10-CM | POA: Diagnosis not present

## 2015-05-20 DIAGNOSIS — Z992 Dependence on renal dialysis: Secondary | ICD-10-CM | POA: Diagnosis not present

## 2015-05-21 DIAGNOSIS — E119 Type 2 diabetes mellitus without complications: Secondary | ICD-10-CM | POA: Diagnosis not present

## 2015-05-21 DIAGNOSIS — D631 Anemia in chronic kidney disease: Secondary | ICD-10-CM | POA: Diagnosis not present

## 2015-05-21 DIAGNOSIS — N186 End stage renal disease: Secondary | ICD-10-CM | POA: Diagnosis not present

## 2015-05-23 DIAGNOSIS — N186 End stage renal disease: Secondary | ICD-10-CM | POA: Diagnosis not present

## 2015-05-23 DIAGNOSIS — D631 Anemia in chronic kidney disease: Secondary | ICD-10-CM | POA: Diagnosis not present

## 2015-05-23 DIAGNOSIS — E119 Type 2 diabetes mellitus without complications: Secondary | ICD-10-CM | POA: Diagnosis not present

## 2015-05-25 DIAGNOSIS — D631 Anemia in chronic kidney disease: Secondary | ICD-10-CM | POA: Diagnosis not present

## 2015-05-25 DIAGNOSIS — N186 End stage renal disease: Secondary | ICD-10-CM | POA: Diagnosis not present

## 2015-05-25 DIAGNOSIS — E119 Type 2 diabetes mellitus without complications: Secondary | ICD-10-CM | POA: Diagnosis not present

## 2015-05-25 IMAGING — CR DG CHEST 2V
2 series · 2 of 2 positions shown · non-contrast
Comparison: Chest radiograph performed 08/16/2013

CLINICAL DATA: Preoperative chest radiograph; hypertension.

EXAM:
CHEST  2 VIEW

[w chest pa]
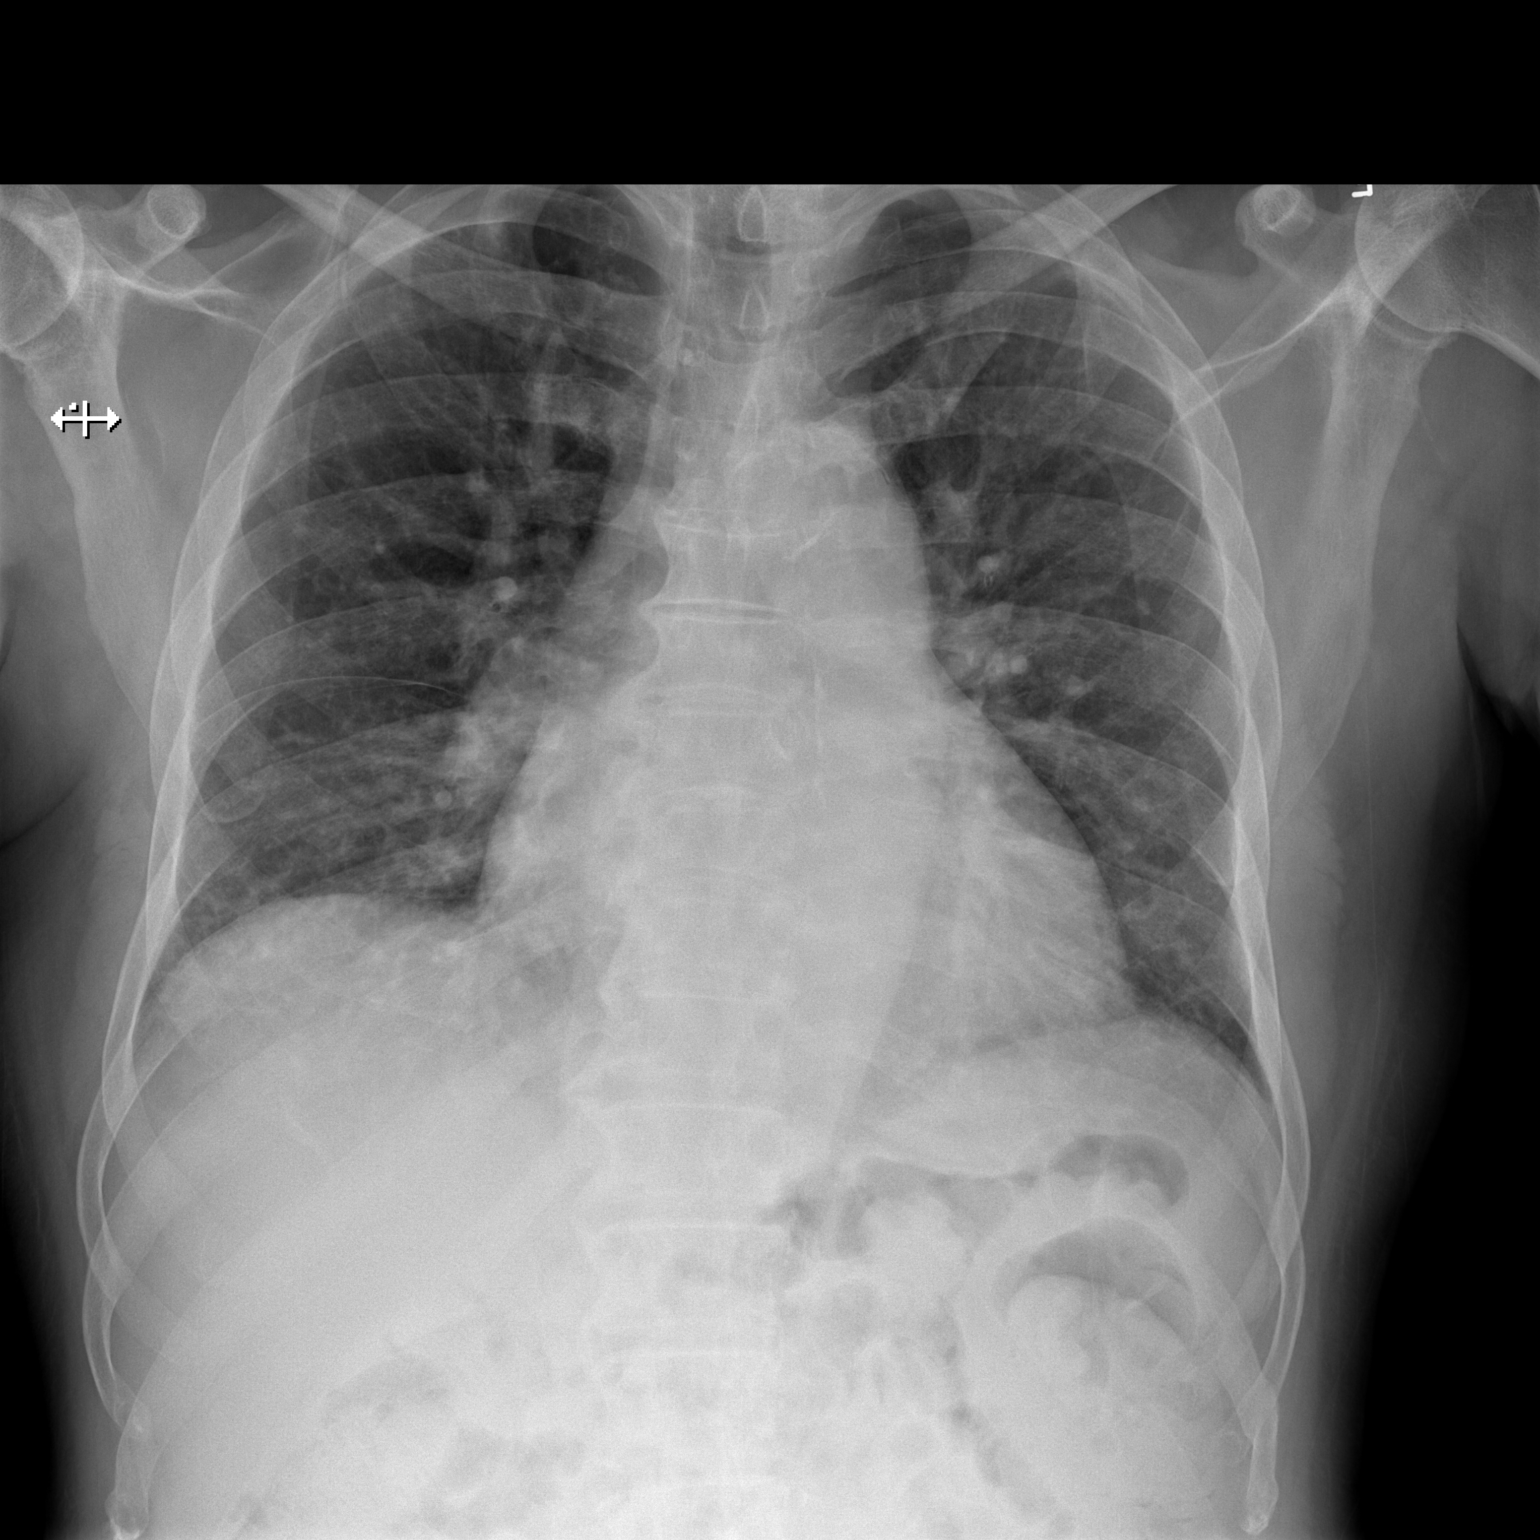

[w chest lat]
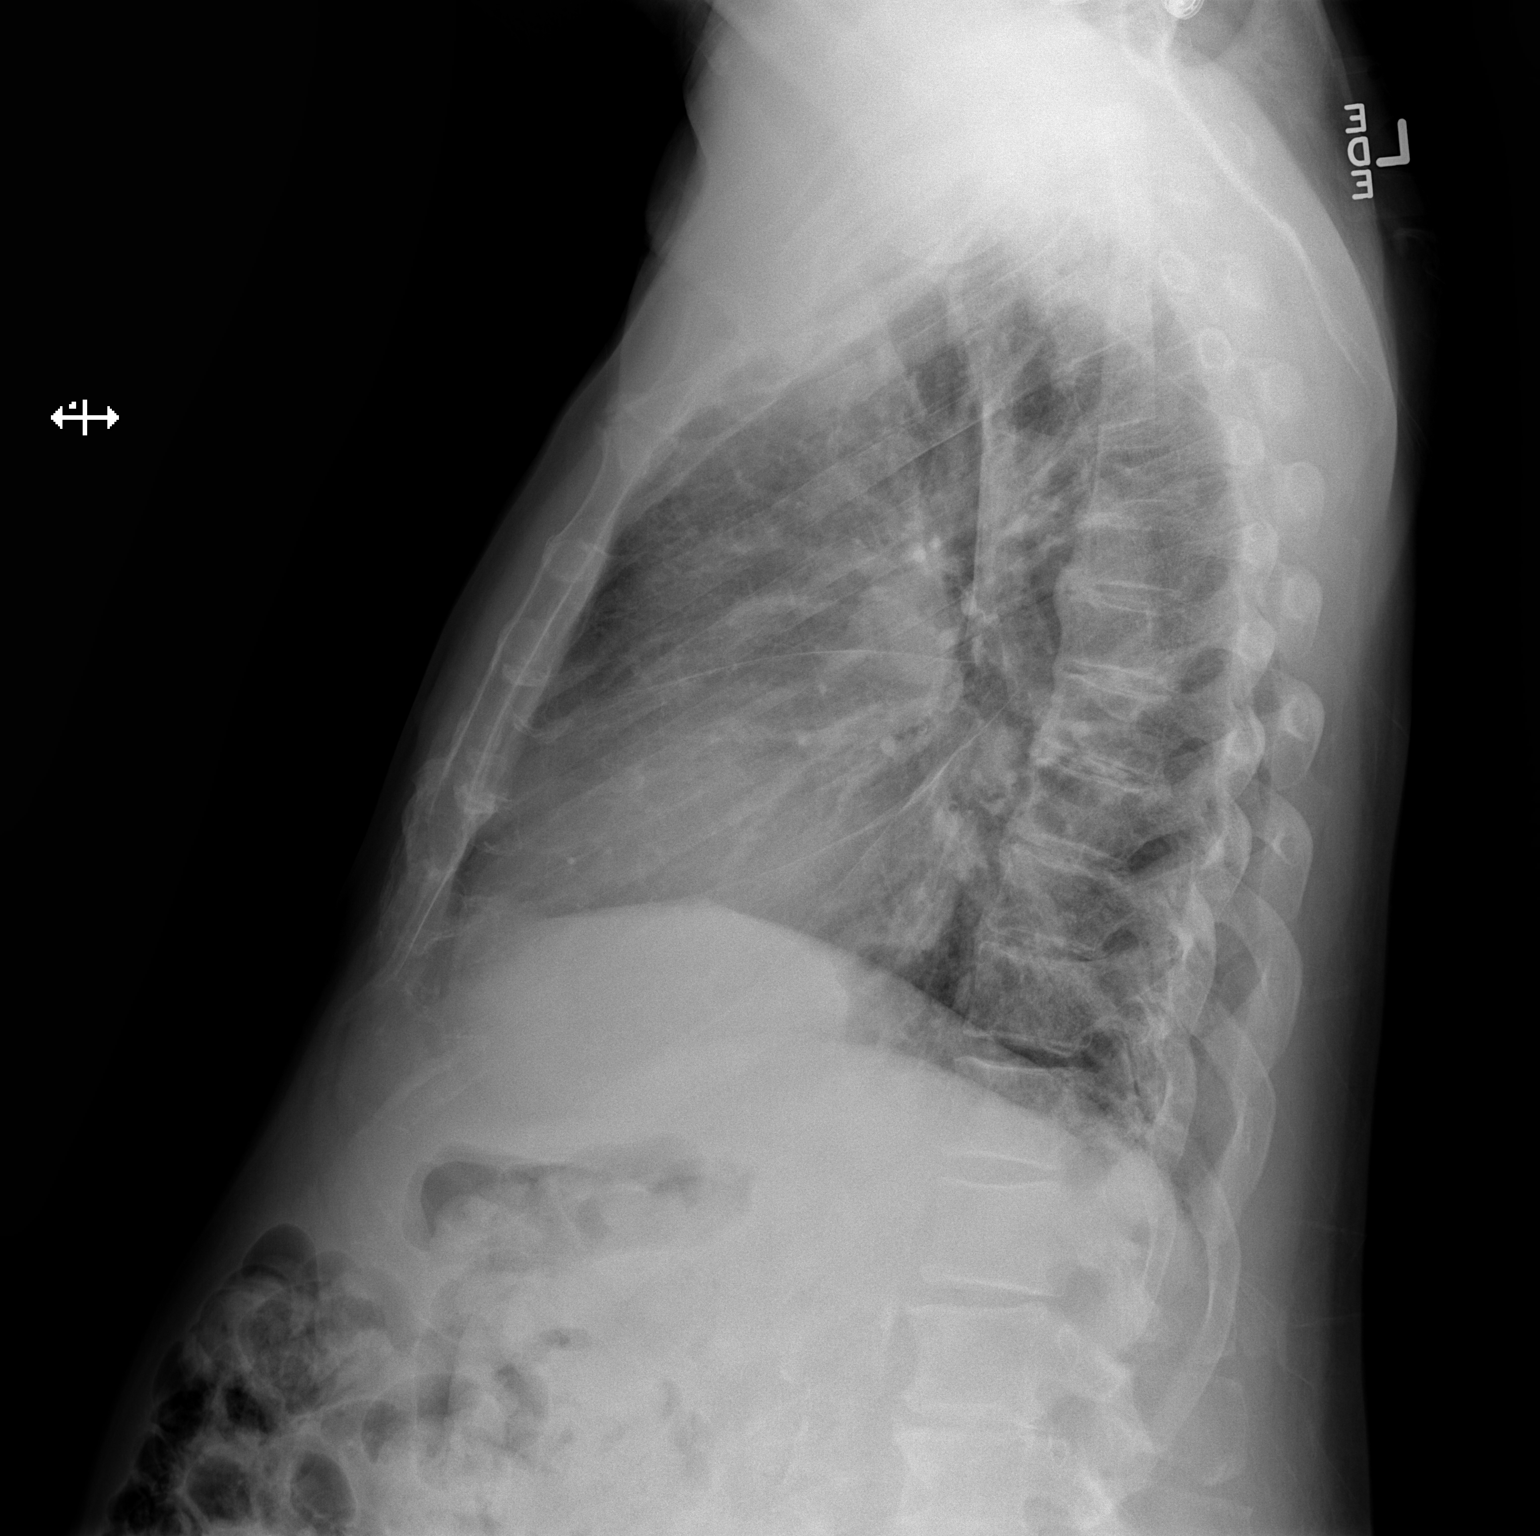

[2 of 2 positions shown; findings below may reference images not displayed]

FINDINGS: The lungs are well-aerated. Vascular congestion is noted, without
significant pulmonary edema. Mild bibasilar atelectasis is seen.
There is no evidence of pleural effusion or pneumothorax.

The heart is borderline enlarged. No acute osseous abnormalities are
seen.
IMPRESSION: Vascular congestion and borderline cardiomegaly, without significant
pulmonary edema. Mild bibasilar atelectasis seen.

## 2015-05-28 DIAGNOSIS — N186 End stage renal disease: Secondary | ICD-10-CM | POA: Diagnosis not present

## 2015-05-28 DIAGNOSIS — D631 Anemia in chronic kidney disease: Secondary | ICD-10-CM | POA: Diagnosis not present

## 2015-05-28 DIAGNOSIS — E119 Type 2 diabetes mellitus without complications: Secondary | ICD-10-CM | POA: Diagnosis not present

## 2015-05-30 DIAGNOSIS — N186 End stage renal disease: Secondary | ICD-10-CM | POA: Diagnosis not present

## 2015-05-30 DIAGNOSIS — D631 Anemia in chronic kidney disease: Secondary | ICD-10-CM | POA: Diagnosis not present

## 2015-05-30 DIAGNOSIS — E119 Type 2 diabetes mellitus without complications: Secondary | ICD-10-CM | POA: Diagnosis not present

## 2015-06-01 DIAGNOSIS — E119 Type 2 diabetes mellitus without complications: Secondary | ICD-10-CM | POA: Diagnosis not present

## 2015-06-01 DIAGNOSIS — D631 Anemia in chronic kidney disease: Secondary | ICD-10-CM | POA: Diagnosis not present

## 2015-06-01 DIAGNOSIS — N186 End stage renal disease: Secondary | ICD-10-CM | POA: Diagnosis not present

## 2015-06-04 DIAGNOSIS — N186 End stage renal disease: Secondary | ICD-10-CM | POA: Diagnosis not present

## 2015-06-04 DIAGNOSIS — D631 Anemia in chronic kidney disease: Secondary | ICD-10-CM | POA: Diagnosis not present

## 2015-06-04 DIAGNOSIS — E119 Type 2 diabetes mellitus without complications: Secondary | ICD-10-CM | POA: Diagnosis not present

## 2015-06-06 DIAGNOSIS — N186 End stage renal disease: Secondary | ICD-10-CM | POA: Diagnosis not present

## 2015-06-06 DIAGNOSIS — D631 Anemia in chronic kidney disease: Secondary | ICD-10-CM | POA: Diagnosis not present

## 2015-06-06 DIAGNOSIS — E119 Type 2 diabetes mellitus without complications: Secondary | ICD-10-CM | POA: Diagnosis not present

## 2015-06-08 DIAGNOSIS — D631 Anemia in chronic kidney disease: Secondary | ICD-10-CM | POA: Diagnosis not present

## 2015-06-08 DIAGNOSIS — N186 End stage renal disease: Secondary | ICD-10-CM | POA: Diagnosis not present

## 2015-06-08 DIAGNOSIS — E119 Type 2 diabetes mellitus without complications: Secondary | ICD-10-CM | POA: Diagnosis not present

## 2015-06-11 DIAGNOSIS — N186 End stage renal disease: Secondary | ICD-10-CM | POA: Diagnosis not present

## 2015-06-11 DIAGNOSIS — D631 Anemia in chronic kidney disease: Secondary | ICD-10-CM | POA: Diagnosis not present

## 2015-06-11 DIAGNOSIS — E119 Type 2 diabetes mellitus without complications: Secondary | ICD-10-CM | POA: Diagnosis not present

## 2015-06-13 DIAGNOSIS — E119 Type 2 diabetes mellitus without complications: Secondary | ICD-10-CM | POA: Diagnosis not present

## 2015-06-13 DIAGNOSIS — D631 Anemia in chronic kidney disease: Secondary | ICD-10-CM | POA: Diagnosis not present

## 2015-06-13 DIAGNOSIS — N186 End stage renal disease: Secondary | ICD-10-CM | POA: Diagnosis not present

## 2015-06-15 DIAGNOSIS — N186 End stage renal disease: Secondary | ICD-10-CM | POA: Diagnosis not present

## 2015-06-15 DIAGNOSIS — E119 Type 2 diabetes mellitus without complications: Secondary | ICD-10-CM | POA: Diagnosis not present

## 2015-06-15 DIAGNOSIS — D631 Anemia in chronic kidney disease: Secondary | ICD-10-CM | POA: Diagnosis not present

## 2015-06-18 DIAGNOSIS — D631 Anemia in chronic kidney disease: Secondary | ICD-10-CM | POA: Diagnosis not present

## 2015-06-18 DIAGNOSIS — N186 End stage renal disease: Secondary | ICD-10-CM | POA: Diagnosis not present

## 2015-06-18 DIAGNOSIS — E119 Type 2 diabetes mellitus without complications: Secondary | ICD-10-CM | POA: Diagnosis not present

## 2015-06-19 DIAGNOSIS — N186 End stage renal disease: Secondary | ICD-10-CM | POA: Diagnosis not present

## 2015-06-19 DIAGNOSIS — E1129 Type 2 diabetes mellitus with other diabetic kidney complication: Secondary | ICD-10-CM | POA: Diagnosis not present

## 2015-06-19 DIAGNOSIS — Z992 Dependence on renal dialysis: Secondary | ICD-10-CM | POA: Diagnosis not present

## 2015-06-20 DIAGNOSIS — E119 Type 2 diabetes mellitus without complications: Secondary | ICD-10-CM | POA: Diagnosis not present

## 2015-06-20 DIAGNOSIS — D509 Iron deficiency anemia, unspecified: Secondary | ICD-10-CM | POA: Diagnosis not present

## 2015-06-20 DIAGNOSIS — N186 End stage renal disease: Secondary | ICD-10-CM | POA: Diagnosis not present

## 2015-06-20 DIAGNOSIS — D631 Anemia in chronic kidney disease: Secondary | ICD-10-CM | POA: Diagnosis not present

## 2015-06-22 DIAGNOSIS — E119 Type 2 diabetes mellitus without complications: Secondary | ICD-10-CM | POA: Diagnosis not present

## 2015-06-22 DIAGNOSIS — D631 Anemia in chronic kidney disease: Secondary | ICD-10-CM | POA: Diagnosis not present

## 2015-06-22 DIAGNOSIS — N186 End stage renal disease: Secondary | ICD-10-CM | POA: Diagnosis not present

## 2015-06-22 DIAGNOSIS — D509 Iron deficiency anemia, unspecified: Secondary | ICD-10-CM | POA: Diagnosis not present

## 2015-06-25 DIAGNOSIS — N186 End stage renal disease: Secondary | ICD-10-CM | POA: Diagnosis not present

## 2015-06-25 DIAGNOSIS — D509 Iron deficiency anemia, unspecified: Secondary | ICD-10-CM | POA: Diagnosis not present

## 2015-06-25 DIAGNOSIS — E119 Type 2 diabetes mellitus without complications: Secondary | ICD-10-CM | POA: Diagnosis not present

## 2015-06-25 DIAGNOSIS — D631 Anemia in chronic kidney disease: Secondary | ICD-10-CM | POA: Diagnosis not present

## 2015-06-27 DIAGNOSIS — D631 Anemia in chronic kidney disease: Secondary | ICD-10-CM | POA: Diagnosis not present

## 2015-06-27 DIAGNOSIS — E119 Type 2 diabetes mellitus without complications: Secondary | ICD-10-CM | POA: Diagnosis not present

## 2015-06-27 DIAGNOSIS — D509 Iron deficiency anemia, unspecified: Secondary | ICD-10-CM | POA: Diagnosis not present

## 2015-06-27 DIAGNOSIS — N186 End stage renal disease: Secondary | ICD-10-CM | POA: Diagnosis not present

## 2015-06-28 DIAGNOSIS — Z1389 Encounter for screening for other disorder: Secondary | ICD-10-CM | POA: Diagnosis not present

## 2015-06-28 DIAGNOSIS — Z139 Encounter for screening, unspecified: Secondary | ICD-10-CM | POA: Diagnosis not present

## 2015-06-28 DIAGNOSIS — Z0000 Encounter for general adult medical examination without abnormal findings: Secondary | ICD-10-CM | POA: Diagnosis not present

## 2015-06-29 DIAGNOSIS — D509 Iron deficiency anemia, unspecified: Secondary | ICD-10-CM | POA: Diagnosis not present

## 2015-06-29 DIAGNOSIS — E119 Type 2 diabetes mellitus without complications: Secondary | ICD-10-CM | POA: Diagnosis not present

## 2015-06-29 DIAGNOSIS — D631 Anemia in chronic kidney disease: Secondary | ICD-10-CM | POA: Diagnosis not present

## 2015-06-29 DIAGNOSIS — N186 End stage renal disease: Secondary | ICD-10-CM | POA: Diagnosis not present

## 2015-07-01 DIAGNOSIS — N186 End stage renal disease: Secondary | ICD-10-CM | POA: Diagnosis not present

## 2015-07-01 DIAGNOSIS — Z992 Dependence on renal dialysis: Secondary | ICD-10-CM | POA: Diagnosis not present

## 2015-07-01 DIAGNOSIS — R269 Unspecified abnormalities of gait and mobility: Secondary | ICD-10-CM | POA: Diagnosis not present

## 2015-07-01 DIAGNOSIS — F0150 Vascular dementia without behavioral disturbance: Secondary | ICD-10-CM | POA: Diagnosis not present

## 2015-07-02 DIAGNOSIS — D509 Iron deficiency anemia, unspecified: Secondary | ICD-10-CM | POA: Diagnosis not present

## 2015-07-02 DIAGNOSIS — E119 Type 2 diabetes mellitus without complications: Secondary | ICD-10-CM | POA: Diagnosis not present

## 2015-07-02 DIAGNOSIS — D631 Anemia in chronic kidney disease: Secondary | ICD-10-CM | POA: Diagnosis not present

## 2015-07-02 DIAGNOSIS — N186 End stage renal disease: Secondary | ICD-10-CM | POA: Diagnosis not present

## 2015-07-04 DIAGNOSIS — D631 Anemia in chronic kidney disease: Secondary | ICD-10-CM | POA: Diagnosis not present

## 2015-07-04 DIAGNOSIS — E119 Type 2 diabetes mellitus without complications: Secondary | ICD-10-CM | POA: Diagnosis not present

## 2015-07-04 DIAGNOSIS — D509 Iron deficiency anemia, unspecified: Secondary | ICD-10-CM | POA: Diagnosis not present

## 2015-07-04 DIAGNOSIS — N186 End stage renal disease: Secondary | ICD-10-CM | POA: Diagnosis not present

## 2015-07-06 DIAGNOSIS — D631 Anemia in chronic kidney disease: Secondary | ICD-10-CM | POA: Diagnosis not present

## 2015-07-06 DIAGNOSIS — E119 Type 2 diabetes mellitus without complications: Secondary | ICD-10-CM | POA: Diagnosis not present

## 2015-07-06 DIAGNOSIS — N186 End stage renal disease: Secondary | ICD-10-CM | POA: Diagnosis not present

## 2015-07-06 DIAGNOSIS — D509 Iron deficiency anemia, unspecified: Secondary | ICD-10-CM | POA: Diagnosis not present

## 2015-07-09 DIAGNOSIS — E119 Type 2 diabetes mellitus without complications: Secondary | ICD-10-CM | POA: Diagnosis not present

## 2015-07-09 DIAGNOSIS — D509 Iron deficiency anemia, unspecified: Secondary | ICD-10-CM | POA: Diagnosis not present

## 2015-07-09 DIAGNOSIS — D631 Anemia in chronic kidney disease: Secondary | ICD-10-CM | POA: Diagnosis not present

## 2015-07-09 DIAGNOSIS — N186 End stage renal disease: Secondary | ICD-10-CM | POA: Diagnosis not present

## 2015-07-11 DIAGNOSIS — E119 Type 2 diabetes mellitus without complications: Secondary | ICD-10-CM | POA: Diagnosis not present

## 2015-07-11 DIAGNOSIS — D631 Anemia in chronic kidney disease: Secondary | ICD-10-CM | POA: Diagnosis not present

## 2015-07-11 DIAGNOSIS — N186 End stage renal disease: Secondary | ICD-10-CM | POA: Diagnosis not present

## 2015-07-11 DIAGNOSIS — D509 Iron deficiency anemia, unspecified: Secondary | ICD-10-CM | POA: Diagnosis not present

## 2015-07-13 DIAGNOSIS — N186 End stage renal disease: Secondary | ICD-10-CM | POA: Diagnosis not present

## 2015-07-13 DIAGNOSIS — E119 Type 2 diabetes mellitus without complications: Secondary | ICD-10-CM | POA: Diagnosis not present

## 2015-07-13 DIAGNOSIS — D509 Iron deficiency anemia, unspecified: Secondary | ICD-10-CM | POA: Diagnosis not present

## 2015-07-13 DIAGNOSIS — D631 Anemia in chronic kidney disease: Secondary | ICD-10-CM | POA: Diagnosis not present

## 2015-07-16 DIAGNOSIS — D509 Iron deficiency anemia, unspecified: Secondary | ICD-10-CM | POA: Diagnosis not present

## 2015-07-16 DIAGNOSIS — N186 End stage renal disease: Secondary | ICD-10-CM | POA: Diagnosis not present

## 2015-07-16 DIAGNOSIS — E119 Type 2 diabetes mellitus without complications: Secondary | ICD-10-CM | POA: Diagnosis not present

## 2015-07-16 DIAGNOSIS — D631 Anemia in chronic kidney disease: Secondary | ICD-10-CM | POA: Diagnosis not present

## 2015-07-18 DIAGNOSIS — D631 Anemia in chronic kidney disease: Secondary | ICD-10-CM | POA: Diagnosis not present

## 2015-07-18 DIAGNOSIS — E119 Type 2 diabetes mellitus without complications: Secondary | ICD-10-CM | POA: Diagnosis not present

## 2015-07-18 DIAGNOSIS — N186 End stage renal disease: Secondary | ICD-10-CM | POA: Diagnosis not present

## 2015-07-18 DIAGNOSIS — D509 Iron deficiency anemia, unspecified: Secondary | ICD-10-CM | POA: Diagnosis not present

## 2015-07-20 DIAGNOSIS — E1129 Type 2 diabetes mellitus with other diabetic kidney complication: Secondary | ICD-10-CM | POA: Diagnosis not present

## 2015-07-20 DIAGNOSIS — E119 Type 2 diabetes mellitus without complications: Secondary | ICD-10-CM | POA: Diagnosis not present

## 2015-07-20 DIAGNOSIS — D631 Anemia in chronic kidney disease: Secondary | ICD-10-CM | POA: Diagnosis not present

## 2015-07-20 DIAGNOSIS — N186 End stage renal disease: Secondary | ICD-10-CM | POA: Diagnosis not present

## 2015-07-20 DIAGNOSIS — D509 Iron deficiency anemia, unspecified: Secondary | ICD-10-CM | POA: Diagnosis not present

## 2015-07-20 DIAGNOSIS — Z992 Dependence on renal dialysis: Secondary | ICD-10-CM | POA: Diagnosis not present

## 2015-07-23 DIAGNOSIS — E119 Type 2 diabetes mellitus without complications: Secondary | ICD-10-CM | POA: Diagnosis not present

## 2015-07-23 DIAGNOSIS — D509 Iron deficiency anemia, unspecified: Secondary | ICD-10-CM | POA: Diagnosis not present

## 2015-07-23 DIAGNOSIS — N186 End stage renal disease: Secondary | ICD-10-CM | POA: Diagnosis not present

## 2015-07-23 DIAGNOSIS — D631 Anemia in chronic kidney disease: Secondary | ICD-10-CM | POA: Diagnosis not present

## 2015-07-25 DIAGNOSIS — E119 Type 2 diabetes mellitus without complications: Secondary | ICD-10-CM | POA: Diagnosis not present

## 2015-07-25 DIAGNOSIS — D631 Anemia in chronic kidney disease: Secondary | ICD-10-CM | POA: Diagnosis not present

## 2015-07-25 DIAGNOSIS — N186 End stage renal disease: Secondary | ICD-10-CM | POA: Diagnosis not present

## 2015-07-25 DIAGNOSIS — D509 Iron deficiency anemia, unspecified: Secondary | ICD-10-CM | POA: Diagnosis not present

## 2015-07-27 DIAGNOSIS — D509 Iron deficiency anemia, unspecified: Secondary | ICD-10-CM | POA: Diagnosis not present

## 2015-07-27 DIAGNOSIS — D631 Anemia in chronic kidney disease: Secondary | ICD-10-CM | POA: Diagnosis not present

## 2015-07-27 DIAGNOSIS — E119 Type 2 diabetes mellitus without complications: Secondary | ICD-10-CM | POA: Diagnosis not present

## 2015-07-27 DIAGNOSIS — N186 End stage renal disease: Secondary | ICD-10-CM | POA: Diagnosis not present

## 2015-07-30 DIAGNOSIS — E119 Type 2 diabetes mellitus without complications: Secondary | ICD-10-CM | POA: Diagnosis not present

## 2015-07-30 DIAGNOSIS — D631 Anemia in chronic kidney disease: Secondary | ICD-10-CM | POA: Diagnosis not present

## 2015-07-30 DIAGNOSIS — N186 End stage renal disease: Secondary | ICD-10-CM | POA: Diagnosis not present

## 2015-07-30 DIAGNOSIS — D509 Iron deficiency anemia, unspecified: Secondary | ICD-10-CM | POA: Diagnosis not present

## 2015-07-31 IMAGING — CT CT HEAD W/O CM
1 series · 16 of 30 positions shown, 20 images · non-contrast
Comparison: CT HEAD W/O CM dated 11/17/2013

CLINICAL DATA: confusion

EXAM:
CT HEAD WITHOUT CONTRAST
TECHNIQUE: Contiguous axial images were obtained from the base of the skull
through the vertex without intravenous contrast.

[Series 3: head 5.0 h30s · axial · 0.46mm/px · z∈[+537,+692]mm · 16 of 35 slices shown, 20 images]
[im 2/35  brain]
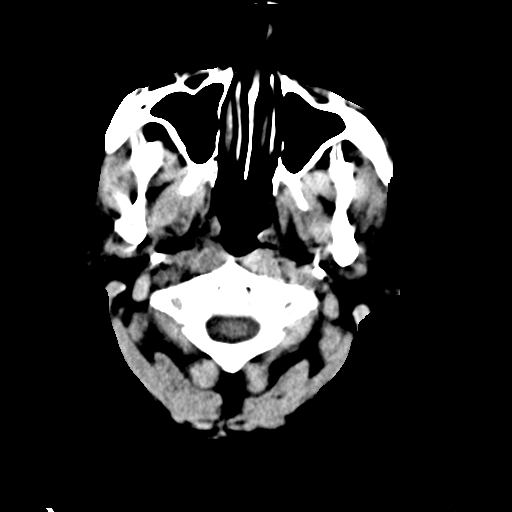
[im 2/35  bone]
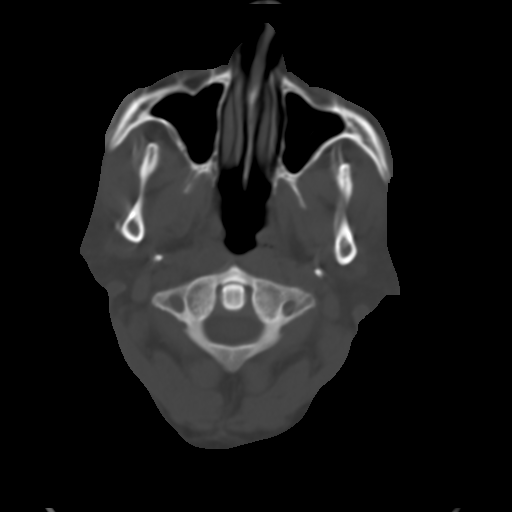
[im 4/35  brain]
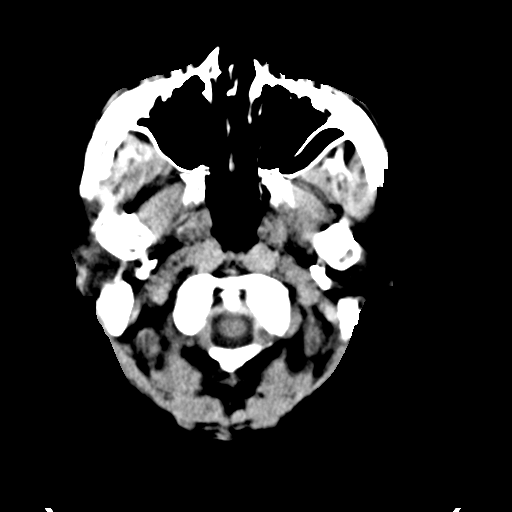
[im 6/35  brain]
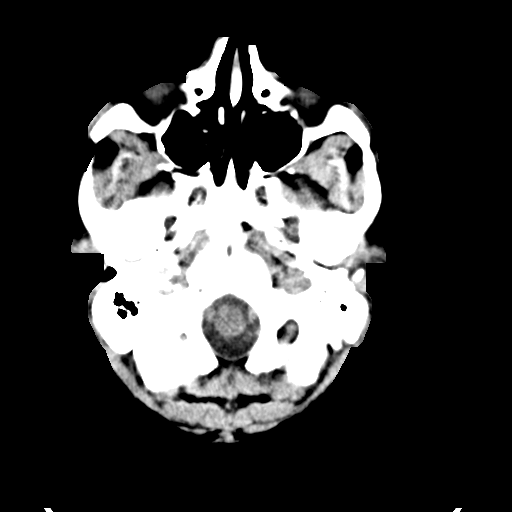
[im 9/35  brain]
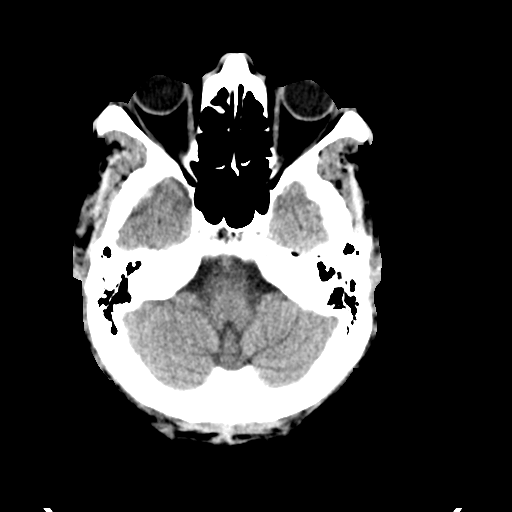
[im 10/35  brain]
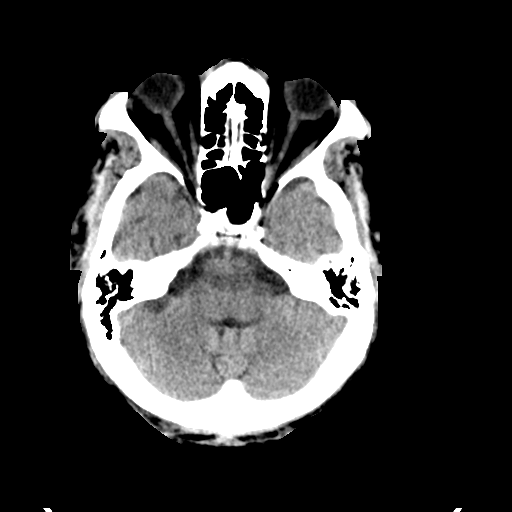
[im 10/35  bone]
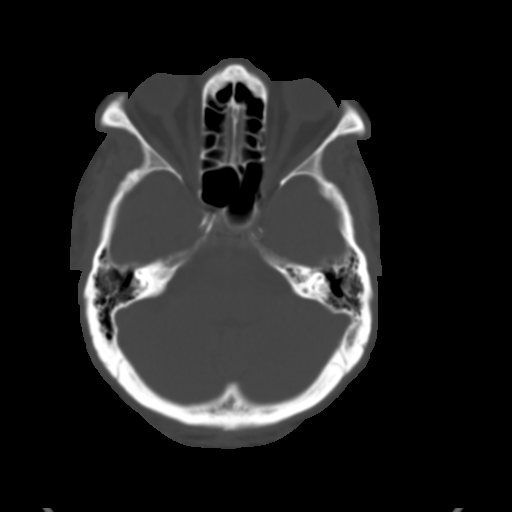
[im 12/35  brain]
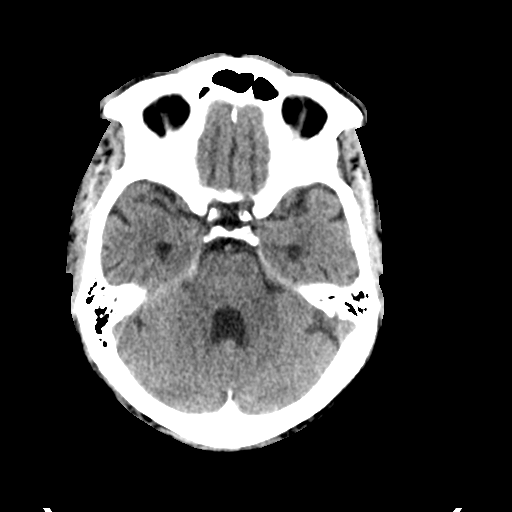
[im 15/35  brain]
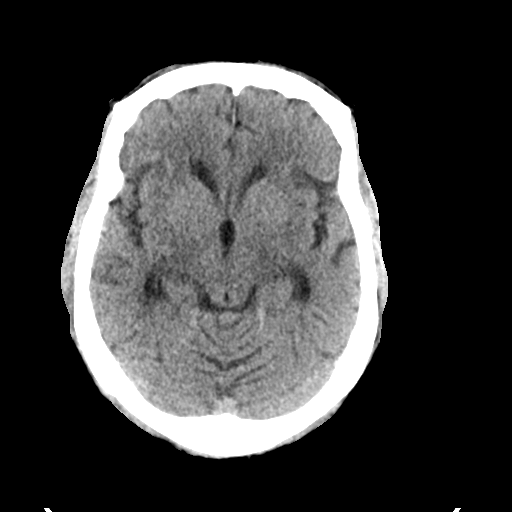
[im 17/35  brain]
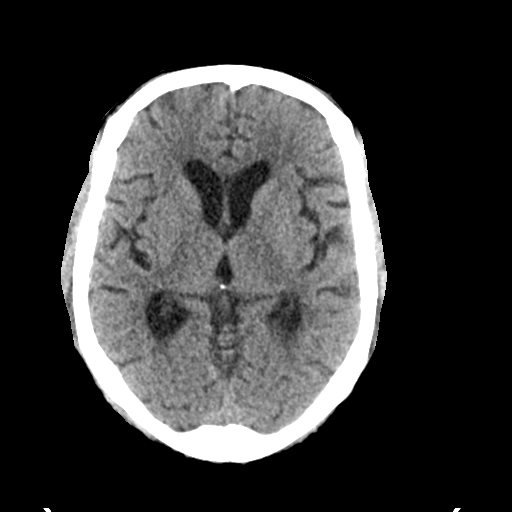
[im 18/35  brain]
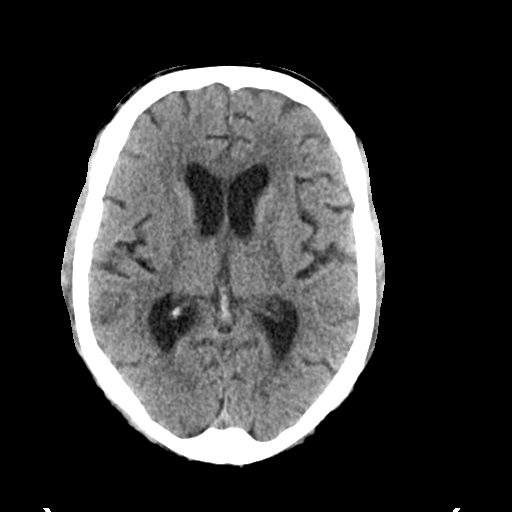
[im 18/35  bone]
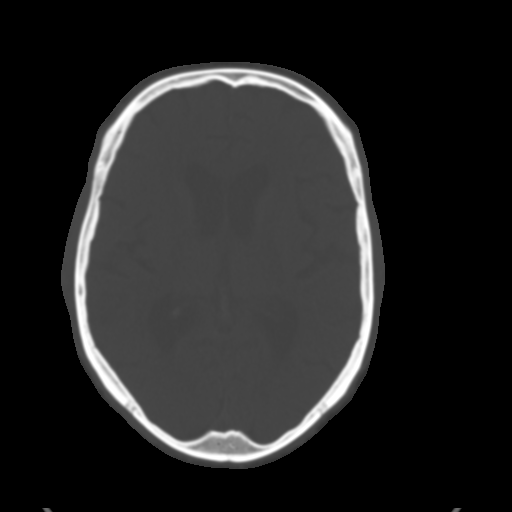
[im 20/35  brain]
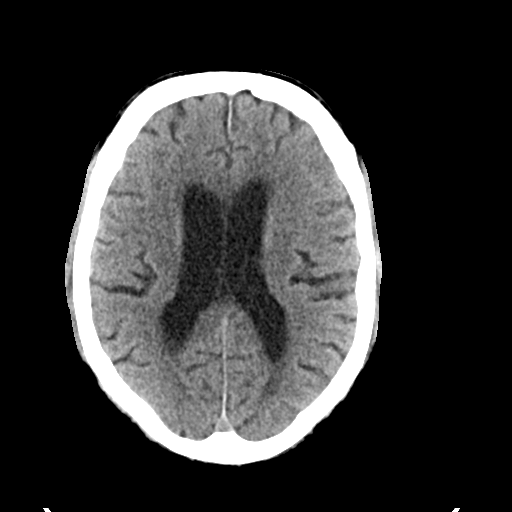
[im 23/35  brain]
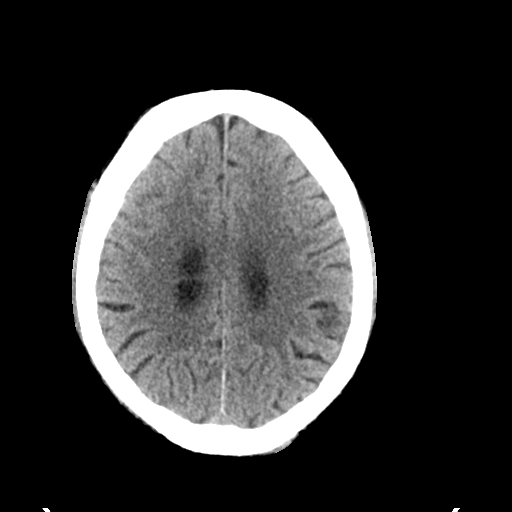
[im 25/35  brain]
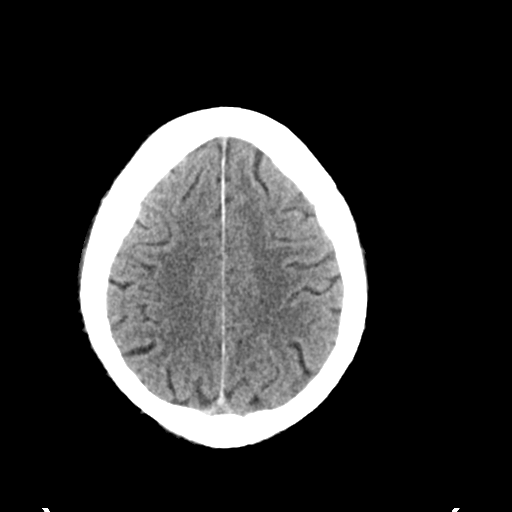
[im 26/35  brain]
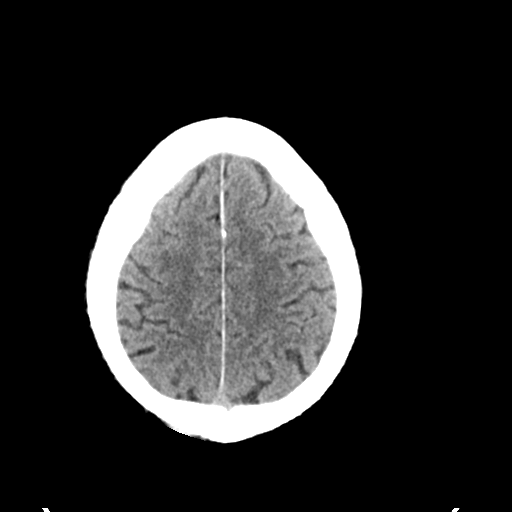
[im 26/35  bone]
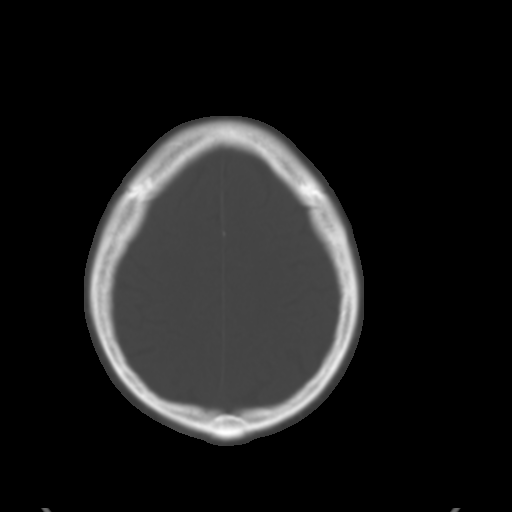
[im 29/35  brain]
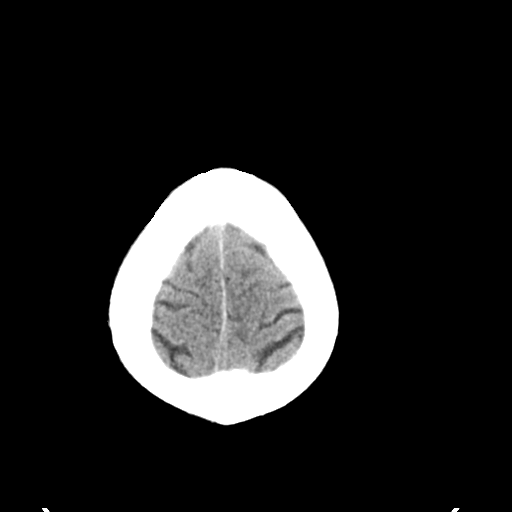
[im 31/35  brain]
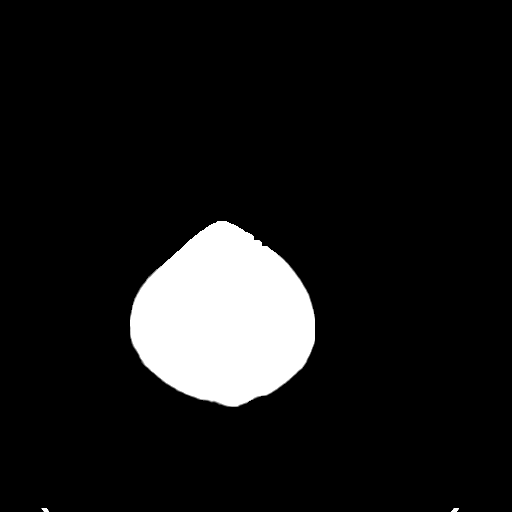
[im 33/35  brain]
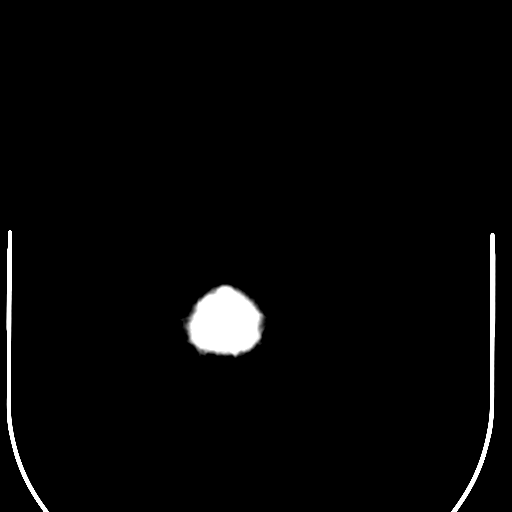

[16 of 30 positions shown; findings below may reference images not displayed]

FINDINGS: No acute intracranial abnormality. Specifically, no hemorrhage,
hydrocephalus, mass lesion, acute infarction, or significant
intracranial injury. No acute calvarial abnormality. Stable mild
global atrophy. Mild areas low attenuation within the subcortical,
deep, and periventricular white matter regions consistent with small
vessel white matter ischemic changes.
IMPRESSION: No acute intracranial abnormality.

## 2015-08-01 DIAGNOSIS — D631 Anemia in chronic kidney disease: Secondary | ICD-10-CM | POA: Diagnosis not present

## 2015-08-01 DIAGNOSIS — E119 Type 2 diabetes mellitus without complications: Secondary | ICD-10-CM | POA: Diagnosis not present

## 2015-08-01 DIAGNOSIS — N186 End stage renal disease: Secondary | ICD-10-CM | POA: Diagnosis not present

## 2015-08-01 DIAGNOSIS — D509 Iron deficiency anemia, unspecified: Secondary | ICD-10-CM | POA: Diagnosis not present

## 2015-08-03 DIAGNOSIS — N186 End stage renal disease: Secondary | ICD-10-CM | POA: Diagnosis not present

## 2015-08-03 DIAGNOSIS — D631 Anemia in chronic kidney disease: Secondary | ICD-10-CM | POA: Diagnosis not present

## 2015-08-03 DIAGNOSIS — E119 Type 2 diabetes mellitus without complications: Secondary | ICD-10-CM | POA: Diagnosis not present

## 2015-08-03 DIAGNOSIS — D509 Iron deficiency anemia, unspecified: Secondary | ICD-10-CM | POA: Diagnosis not present

## 2015-08-06 DIAGNOSIS — E119 Type 2 diabetes mellitus without complications: Secondary | ICD-10-CM | POA: Diagnosis not present

## 2015-08-06 DIAGNOSIS — D631 Anemia in chronic kidney disease: Secondary | ICD-10-CM | POA: Diagnosis not present

## 2015-08-06 DIAGNOSIS — N186 End stage renal disease: Secondary | ICD-10-CM | POA: Diagnosis not present

## 2015-08-06 DIAGNOSIS — D509 Iron deficiency anemia, unspecified: Secondary | ICD-10-CM | POA: Diagnosis not present

## 2015-08-08 DIAGNOSIS — E119 Type 2 diabetes mellitus without complications: Secondary | ICD-10-CM | POA: Diagnosis not present

## 2015-08-08 DIAGNOSIS — D509 Iron deficiency anemia, unspecified: Secondary | ICD-10-CM | POA: Diagnosis not present

## 2015-08-08 DIAGNOSIS — N186 End stage renal disease: Secondary | ICD-10-CM | POA: Diagnosis not present

## 2015-08-08 DIAGNOSIS — D631 Anemia in chronic kidney disease: Secondary | ICD-10-CM | POA: Diagnosis not present

## 2015-08-10 DIAGNOSIS — N186 End stage renal disease: Secondary | ICD-10-CM | POA: Diagnosis not present

## 2015-08-10 DIAGNOSIS — D631 Anemia in chronic kidney disease: Secondary | ICD-10-CM | POA: Diagnosis not present

## 2015-08-10 DIAGNOSIS — E119 Type 2 diabetes mellitus without complications: Secondary | ICD-10-CM | POA: Diagnosis not present

## 2015-08-10 DIAGNOSIS — D509 Iron deficiency anemia, unspecified: Secondary | ICD-10-CM | POA: Diagnosis not present

## 2015-08-13 DIAGNOSIS — E119 Type 2 diabetes mellitus without complications: Secondary | ICD-10-CM | POA: Diagnosis not present

## 2015-08-13 DIAGNOSIS — N186 End stage renal disease: Secondary | ICD-10-CM | POA: Diagnosis not present

## 2015-08-13 DIAGNOSIS — D509 Iron deficiency anemia, unspecified: Secondary | ICD-10-CM | POA: Diagnosis not present

## 2015-08-13 DIAGNOSIS — D631 Anemia in chronic kidney disease: Secondary | ICD-10-CM | POA: Diagnosis not present

## 2015-08-15 DIAGNOSIS — D631 Anemia in chronic kidney disease: Secondary | ICD-10-CM | POA: Diagnosis not present

## 2015-08-15 DIAGNOSIS — N186 End stage renal disease: Secondary | ICD-10-CM | POA: Diagnosis not present

## 2015-08-15 DIAGNOSIS — E119 Type 2 diabetes mellitus without complications: Secondary | ICD-10-CM | POA: Diagnosis not present

## 2015-08-15 DIAGNOSIS — D509 Iron deficiency anemia, unspecified: Secondary | ICD-10-CM | POA: Diagnosis not present

## 2015-08-17 DIAGNOSIS — D509 Iron deficiency anemia, unspecified: Secondary | ICD-10-CM | POA: Diagnosis not present

## 2015-08-17 DIAGNOSIS — E119 Type 2 diabetes mellitus without complications: Secondary | ICD-10-CM | POA: Diagnosis not present

## 2015-08-17 DIAGNOSIS — D631 Anemia in chronic kidney disease: Secondary | ICD-10-CM | POA: Diagnosis not present

## 2015-08-17 DIAGNOSIS — N186 End stage renal disease: Secondary | ICD-10-CM | POA: Diagnosis not present

## 2015-08-20 DIAGNOSIS — E119 Type 2 diabetes mellitus without complications: Secondary | ICD-10-CM | POA: Diagnosis not present

## 2015-08-20 DIAGNOSIS — E1129 Type 2 diabetes mellitus with other diabetic kidney complication: Secondary | ICD-10-CM | POA: Diagnosis not present

## 2015-08-20 DIAGNOSIS — D509 Iron deficiency anemia, unspecified: Secondary | ICD-10-CM | POA: Diagnosis not present

## 2015-08-20 DIAGNOSIS — N186 End stage renal disease: Secondary | ICD-10-CM | POA: Diagnosis not present

## 2015-08-20 DIAGNOSIS — D631 Anemia in chronic kidney disease: Secondary | ICD-10-CM | POA: Diagnosis not present

## 2015-08-20 DIAGNOSIS — Z992 Dependence on renal dialysis: Secondary | ICD-10-CM | POA: Diagnosis not present

## 2015-08-22 DIAGNOSIS — D631 Anemia in chronic kidney disease: Secondary | ICD-10-CM | POA: Diagnosis not present

## 2015-08-22 DIAGNOSIS — E119 Type 2 diabetes mellitus without complications: Secondary | ICD-10-CM | POA: Diagnosis not present

## 2015-08-22 DIAGNOSIS — N186 End stage renal disease: Secondary | ICD-10-CM | POA: Diagnosis not present

## 2015-08-22 DIAGNOSIS — D509 Iron deficiency anemia, unspecified: Secondary | ICD-10-CM | POA: Diagnosis not present

## 2015-08-24 DIAGNOSIS — E119 Type 2 diabetes mellitus without complications: Secondary | ICD-10-CM | POA: Diagnosis not present

## 2015-08-24 DIAGNOSIS — D509 Iron deficiency anemia, unspecified: Secondary | ICD-10-CM | POA: Diagnosis not present

## 2015-08-24 DIAGNOSIS — D631 Anemia in chronic kidney disease: Secondary | ICD-10-CM | POA: Diagnosis not present

## 2015-08-24 DIAGNOSIS — N186 End stage renal disease: Secondary | ICD-10-CM | POA: Diagnosis not present

## 2015-08-27 DIAGNOSIS — E119 Type 2 diabetes mellitus without complications: Secondary | ICD-10-CM | POA: Diagnosis not present

## 2015-08-27 DIAGNOSIS — D509 Iron deficiency anemia, unspecified: Secondary | ICD-10-CM | POA: Diagnosis not present

## 2015-08-27 DIAGNOSIS — N186 End stage renal disease: Secondary | ICD-10-CM | POA: Diagnosis not present

## 2015-08-27 DIAGNOSIS — D631 Anemia in chronic kidney disease: Secondary | ICD-10-CM | POA: Diagnosis not present

## 2015-08-29 DIAGNOSIS — D509 Iron deficiency anemia, unspecified: Secondary | ICD-10-CM | POA: Diagnosis not present

## 2015-08-29 DIAGNOSIS — N186 End stage renal disease: Secondary | ICD-10-CM | POA: Diagnosis not present

## 2015-08-29 DIAGNOSIS — D631 Anemia in chronic kidney disease: Secondary | ICD-10-CM | POA: Diagnosis not present

## 2015-08-29 DIAGNOSIS — E119 Type 2 diabetes mellitus without complications: Secondary | ICD-10-CM | POA: Diagnosis not present

## 2015-08-31 DIAGNOSIS — D631 Anemia in chronic kidney disease: Secondary | ICD-10-CM | POA: Diagnosis not present

## 2015-08-31 DIAGNOSIS — N186 End stage renal disease: Secondary | ICD-10-CM | POA: Diagnosis not present

## 2015-08-31 DIAGNOSIS — E119 Type 2 diabetes mellitus without complications: Secondary | ICD-10-CM | POA: Diagnosis not present

## 2015-08-31 DIAGNOSIS — D509 Iron deficiency anemia, unspecified: Secondary | ICD-10-CM | POA: Diagnosis not present

## 2015-09-03 DIAGNOSIS — E119 Type 2 diabetes mellitus without complications: Secondary | ICD-10-CM | POA: Diagnosis not present

## 2015-09-03 DIAGNOSIS — D509 Iron deficiency anemia, unspecified: Secondary | ICD-10-CM | POA: Diagnosis not present

## 2015-09-03 DIAGNOSIS — D631 Anemia in chronic kidney disease: Secondary | ICD-10-CM | POA: Diagnosis not present

## 2015-09-03 DIAGNOSIS — N186 End stage renal disease: Secondary | ICD-10-CM | POA: Diagnosis not present

## 2015-09-05 DIAGNOSIS — D631 Anemia in chronic kidney disease: Secondary | ICD-10-CM | POA: Diagnosis not present

## 2015-09-05 DIAGNOSIS — N186 End stage renal disease: Secondary | ICD-10-CM | POA: Diagnosis not present

## 2015-09-05 DIAGNOSIS — E119 Type 2 diabetes mellitus without complications: Secondary | ICD-10-CM | POA: Diagnosis not present

## 2015-09-05 DIAGNOSIS — D509 Iron deficiency anemia, unspecified: Secondary | ICD-10-CM | POA: Diagnosis not present

## 2015-09-07 DIAGNOSIS — N186 End stage renal disease: Secondary | ICD-10-CM | POA: Diagnosis not present

## 2015-09-07 DIAGNOSIS — D631 Anemia in chronic kidney disease: Secondary | ICD-10-CM | POA: Diagnosis not present

## 2015-09-07 DIAGNOSIS — D509 Iron deficiency anemia, unspecified: Secondary | ICD-10-CM | POA: Diagnosis not present

## 2015-09-07 DIAGNOSIS — E119 Type 2 diabetes mellitus without complications: Secondary | ICD-10-CM | POA: Diagnosis not present

## 2015-09-10 DIAGNOSIS — N186 End stage renal disease: Secondary | ICD-10-CM | POA: Diagnosis not present

## 2015-09-10 DIAGNOSIS — D631 Anemia in chronic kidney disease: Secondary | ICD-10-CM | POA: Diagnosis not present

## 2015-09-10 DIAGNOSIS — D509 Iron deficiency anemia, unspecified: Secondary | ICD-10-CM | POA: Diagnosis not present

## 2015-09-10 DIAGNOSIS — E119 Type 2 diabetes mellitus without complications: Secondary | ICD-10-CM | POA: Diagnosis not present

## 2015-09-12 DIAGNOSIS — E119 Type 2 diabetes mellitus without complications: Secondary | ICD-10-CM | POA: Diagnosis not present

## 2015-09-12 DIAGNOSIS — N186 End stage renal disease: Secondary | ICD-10-CM | POA: Diagnosis not present

## 2015-09-12 DIAGNOSIS — D509 Iron deficiency anemia, unspecified: Secondary | ICD-10-CM | POA: Diagnosis not present

## 2015-09-12 DIAGNOSIS — D631 Anemia in chronic kidney disease: Secondary | ICD-10-CM | POA: Diagnosis not present

## 2015-09-14 DIAGNOSIS — N186 End stage renal disease: Secondary | ICD-10-CM | POA: Diagnosis not present

## 2015-09-14 DIAGNOSIS — E119 Type 2 diabetes mellitus without complications: Secondary | ICD-10-CM | POA: Diagnosis not present

## 2015-09-14 DIAGNOSIS — D631 Anemia in chronic kidney disease: Secondary | ICD-10-CM | POA: Diagnosis not present

## 2015-09-14 DIAGNOSIS — D509 Iron deficiency anemia, unspecified: Secondary | ICD-10-CM | POA: Diagnosis not present

## 2015-09-17 DIAGNOSIS — E1129 Type 2 diabetes mellitus with other diabetic kidney complication: Secondary | ICD-10-CM | POA: Diagnosis not present

## 2015-09-17 DIAGNOSIS — D509 Iron deficiency anemia, unspecified: Secondary | ICD-10-CM | POA: Diagnosis not present

## 2015-09-17 DIAGNOSIS — D631 Anemia in chronic kidney disease: Secondary | ICD-10-CM | POA: Diagnosis not present

## 2015-09-17 DIAGNOSIS — E119 Type 2 diabetes mellitus without complications: Secondary | ICD-10-CM | POA: Diagnosis not present

## 2015-09-17 DIAGNOSIS — Z992 Dependence on renal dialysis: Secondary | ICD-10-CM | POA: Diagnosis not present

## 2015-09-17 DIAGNOSIS — N186 End stage renal disease: Secondary | ICD-10-CM | POA: Diagnosis not present

## 2015-09-19 DIAGNOSIS — E119 Type 2 diabetes mellitus without complications: Secondary | ICD-10-CM | POA: Diagnosis not present

## 2015-09-19 DIAGNOSIS — D631 Anemia in chronic kidney disease: Secondary | ICD-10-CM | POA: Diagnosis not present

## 2015-09-19 DIAGNOSIS — N186 End stage renal disease: Secondary | ICD-10-CM | POA: Diagnosis not present

## 2015-09-19 DIAGNOSIS — D509 Iron deficiency anemia, unspecified: Secondary | ICD-10-CM | POA: Diagnosis not present

## 2015-09-21 DIAGNOSIS — E119 Type 2 diabetes mellitus without complications: Secondary | ICD-10-CM | POA: Diagnosis not present

## 2015-09-21 DIAGNOSIS — D509 Iron deficiency anemia, unspecified: Secondary | ICD-10-CM | POA: Diagnosis not present

## 2015-09-21 DIAGNOSIS — D631 Anemia in chronic kidney disease: Secondary | ICD-10-CM | POA: Diagnosis not present

## 2015-09-21 DIAGNOSIS — N186 End stage renal disease: Secondary | ICD-10-CM | POA: Diagnosis not present

## 2015-09-24 DIAGNOSIS — D631 Anemia in chronic kidney disease: Secondary | ICD-10-CM | POA: Diagnosis not present

## 2015-09-24 DIAGNOSIS — N186 End stage renal disease: Secondary | ICD-10-CM | POA: Diagnosis not present

## 2015-09-24 DIAGNOSIS — D509 Iron deficiency anemia, unspecified: Secondary | ICD-10-CM | POA: Diagnosis not present

## 2015-09-24 DIAGNOSIS — E119 Type 2 diabetes mellitus without complications: Secondary | ICD-10-CM | POA: Diagnosis not present

## 2015-09-26 DIAGNOSIS — N186 End stage renal disease: Secondary | ICD-10-CM | POA: Diagnosis not present

## 2015-09-26 DIAGNOSIS — E119 Type 2 diabetes mellitus without complications: Secondary | ICD-10-CM | POA: Diagnosis not present

## 2015-09-26 DIAGNOSIS — D509 Iron deficiency anemia, unspecified: Secondary | ICD-10-CM | POA: Diagnosis not present

## 2015-09-26 DIAGNOSIS — D631 Anemia in chronic kidney disease: Secondary | ICD-10-CM | POA: Diagnosis not present

## 2015-09-28 DIAGNOSIS — D509 Iron deficiency anemia, unspecified: Secondary | ICD-10-CM | POA: Diagnosis not present

## 2015-09-28 DIAGNOSIS — E119 Type 2 diabetes mellitus without complications: Secondary | ICD-10-CM | POA: Diagnosis not present

## 2015-09-28 DIAGNOSIS — N186 End stage renal disease: Secondary | ICD-10-CM | POA: Diagnosis not present

## 2015-09-28 DIAGNOSIS — D631 Anemia in chronic kidney disease: Secondary | ICD-10-CM | POA: Diagnosis not present

## 2015-10-01 DIAGNOSIS — D509 Iron deficiency anemia, unspecified: Secondary | ICD-10-CM | POA: Diagnosis not present

## 2015-10-01 DIAGNOSIS — N186 End stage renal disease: Secondary | ICD-10-CM | POA: Diagnosis not present

## 2015-10-01 DIAGNOSIS — E119 Type 2 diabetes mellitus without complications: Secondary | ICD-10-CM | POA: Diagnosis not present

## 2015-10-01 DIAGNOSIS — D631 Anemia in chronic kidney disease: Secondary | ICD-10-CM | POA: Diagnosis not present

## 2015-10-03 DIAGNOSIS — E119 Type 2 diabetes mellitus without complications: Secondary | ICD-10-CM | POA: Diagnosis not present

## 2015-10-03 DIAGNOSIS — D509 Iron deficiency anemia, unspecified: Secondary | ICD-10-CM | POA: Diagnosis not present

## 2015-10-03 DIAGNOSIS — N186 End stage renal disease: Secondary | ICD-10-CM | POA: Diagnosis not present

## 2015-10-03 DIAGNOSIS — D631 Anemia in chronic kidney disease: Secondary | ICD-10-CM | POA: Diagnosis not present

## 2015-10-05 DIAGNOSIS — D509 Iron deficiency anemia, unspecified: Secondary | ICD-10-CM | POA: Diagnosis not present

## 2015-10-05 DIAGNOSIS — E119 Type 2 diabetes mellitus without complications: Secondary | ICD-10-CM | POA: Diagnosis not present

## 2015-10-05 DIAGNOSIS — N186 End stage renal disease: Secondary | ICD-10-CM | POA: Diagnosis not present

## 2015-10-05 DIAGNOSIS — D631 Anemia in chronic kidney disease: Secondary | ICD-10-CM | POA: Diagnosis not present

## 2015-10-08 DIAGNOSIS — N186 End stage renal disease: Secondary | ICD-10-CM | POA: Diagnosis not present

## 2015-10-08 DIAGNOSIS — D509 Iron deficiency anemia, unspecified: Secondary | ICD-10-CM | POA: Diagnosis not present

## 2015-10-08 DIAGNOSIS — D631 Anemia in chronic kidney disease: Secondary | ICD-10-CM | POA: Diagnosis not present

## 2015-10-08 DIAGNOSIS — E119 Type 2 diabetes mellitus without complications: Secondary | ICD-10-CM | POA: Diagnosis not present

## 2015-10-10 DIAGNOSIS — D631 Anemia in chronic kidney disease: Secondary | ICD-10-CM | POA: Diagnosis not present

## 2015-10-10 DIAGNOSIS — N186 End stage renal disease: Secondary | ICD-10-CM | POA: Diagnosis not present

## 2015-10-10 DIAGNOSIS — E119 Type 2 diabetes mellitus without complications: Secondary | ICD-10-CM | POA: Diagnosis not present

## 2015-10-10 DIAGNOSIS — D509 Iron deficiency anemia, unspecified: Secondary | ICD-10-CM | POA: Diagnosis not present

## 2015-10-12 DIAGNOSIS — N186 End stage renal disease: Secondary | ICD-10-CM | POA: Diagnosis not present

## 2015-10-12 DIAGNOSIS — D631 Anemia in chronic kidney disease: Secondary | ICD-10-CM | POA: Diagnosis not present

## 2015-10-12 DIAGNOSIS — D509 Iron deficiency anemia, unspecified: Secondary | ICD-10-CM | POA: Diagnosis not present

## 2015-10-12 DIAGNOSIS — E119 Type 2 diabetes mellitus without complications: Secondary | ICD-10-CM | POA: Diagnosis not present

## 2015-10-15 DIAGNOSIS — D509 Iron deficiency anemia, unspecified: Secondary | ICD-10-CM | POA: Diagnosis not present

## 2015-10-15 DIAGNOSIS — N186 End stage renal disease: Secondary | ICD-10-CM | POA: Diagnosis not present

## 2015-10-15 DIAGNOSIS — E119 Type 2 diabetes mellitus without complications: Secondary | ICD-10-CM | POA: Diagnosis not present

## 2015-10-15 DIAGNOSIS — D631 Anemia in chronic kidney disease: Secondary | ICD-10-CM | POA: Diagnosis not present

## 2015-10-17 DIAGNOSIS — D631 Anemia in chronic kidney disease: Secondary | ICD-10-CM | POA: Diagnosis not present

## 2015-10-17 DIAGNOSIS — E119 Type 2 diabetes mellitus without complications: Secondary | ICD-10-CM | POA: Diagnosis not present

## 2015-10-17 DIAGNOSIS — D509 Iron deficiency anemia, unspecified: Secondary | ICD-10-CM | POA: Diagnosis not present

## 2015-10-17 DIAGNOSIS — N186 End stage renal disease: Secondary | ICD-10-CM | POA: Diagnosis not present

## 2015-10-18 DIAGNOSIS — E1129 Type 2 diabetes mellitus with other diabetic kidney complication: Secondary | ICD-10-CM | POA: Diagnosis not present

## 2015-10-18 DIAGNOSIS — Z992 Dependence on renal dialysis: Secondary | ICD-10-CM | POA: Diagnosis not present

## 2015-10-18 DIAGNOSIS — N186 End stage renal disease: Secondary | ICD-10-CM | POA: Diagnosis not present

## 2015-10-19 DIAGNOSIS — D631 Anemia in chronic kidney disease: Secondary | ICD-10-CM | POA: Diagnosis not present

## 2015-10-19 DIAGNOSIS — D509 Iron deficiency anemia, unspecified: Secondary | ICD-10-CM | POA: Diagnosis not present

## 2015-10-19 DIAGNOSIS — N186 End stage renal disease: Secondary | ICD-10-CM | POA: Diagnosis not present

## 2015-10-19 DIAGNOSIS — E119 Type 2 diabetes mellitus without complications: Secondary | ICD-10-CM | POA: Diagnosis not present

## 2015-10-22 DIAGNOSIS — N186 End stage renal disease: Secondary | ICD-10-CM | POA: Diagnosis not present

## 2015-10-22 DIAGNOSIS — D631 Anemia in chronic kidney disease: Secondary | ICD-10-CM | POA: Diagnosis not present

## 2015-10-22 DIAGNOSIS — E119 Type 2 diabetes mellitus without complications: Secondary | ICD-10-CM | POA: Diagnosis not present

## 2015-10-22 DIAGNOSIS — D509 Iron deficiency anemia, unspecified: Secondary | ICD-10-CM | POA: Diagnosis not present

## 2015-10-24 DIAGNOSIS — E119 Type 2 diabetes mellitus without complications: Secondary | ICD-10-CM | POA: Diagnosis not present

## 2015-10-24 DIAGNOSIS — D631 Anemia in chronic kidney disease: Secondary | ICD-10-CM | POA: Diagnosis not present

## 2015-10-24 DIAGNOSIS — D509 Iron deficiency anemia, unspecified: Secondary | ICD-10-CM | POA: Diagnosis not present

## 2015-10-24 DIAGNOSIS — N186 End stage renal disease: Secondary | ICD-10-CM | POA: Diagnosis not present

## 2015-10-26 DIAGNOSIS — N186 End stage renal disease: Secondary | ICD-10-CM | POA: Diagnosis not present

## 2015-10-26 DIAGNOSIS — D509 Iron deficiency anemia, unspecified: Secondary | ICD-10-CM | POA: Diagnosis not present

## 2015-10-26 DIAGNOSIS — E119 Type 2 diabetes mellitus without complications: Secondary | ICD-10-CM | POA: Diagnosis not present

## 2015-10-26 DIAGNOSIS — D631 Anemia in chronic kidney disease: Secondary | ICD-10-CM | POA: Diagnosis not present

## 2015-10-29 DIAGNOSIS — E119 Type 2 diabetes mellitus without complications: Secondary | ICD-10-CM | POA: Diagnosis not present

## 2015-10-29 DIAGNOSIS — D631 Anemia in chronic kidney disease: Secondary | ICD-10-CM | POA: Diagnosis not present

## 2015-10-29 DIAGNOSIS — N186 End stage renal disease: Secondary | ICD-10-CM | POA: Diagnosis not present

## 2015-10-29 DIAGNOSIS — D509 Iron deficiency anemia, unspecified: Secondary | ICD-10-CM | POA: Diagnosis not present

## 2015-10-31 DIAGNOSIS — D509 Iron deficiency anemia, unspecified: Secondary | ICD-10-CM | POA: Diagnosis not present

## 2015-10-31 DIAGNOSIS — E119 Type 2 diabetes mellitus without complications: Secondary | ICD-10-CM | POA: Diagnosis not present

## 2015-10-31 DIAGNOSIS — D631 Anemia in chronic kidney disease: Secondary | ICD-10-CM | POA: Diagnosis not present

## 2015-10-31 DIAGNOSIS — N186 End stage renal disease: Secondary | ICD-10-CM | POA: Diagnosis not present

## 2015-11-02 DIAGNOSIS — D631 Anemia in chronic kidney disease: Secondary | ICD-10-CM | POA: Diagnosis not present

## 2015-11-02 DIAGNOSIS — D509 Iron deficiency anemia, unspecified: Secondary | ICD-10-CM | POA: Diagnosis not present

## 2015-11-02 DIAGNOSIS — E119 Type 2 diabetes mellitus without complications: Secondary | ICD-10-CM | POA: Diagnosis not present

## 2015-11-02 DIAGNOSIS — N186 End stage renal disease: Secondary | ICD-10-CM | POA: Diagnosis not present

## 2015-11-04 DIAGNOSIS — T82858A Stenosis of vascular prosthetic devices, implants and grafts, initial encounter: Secondary | ICD-10-CM | POA: Diagnosis not present

## 2015-11-04 DIAGNOSIS — N186 End stage renal disease: Secondary | ICD-10-CM | POA: Diagnosis not present

## 2015-11-05 DIAGNOSIS — D631 Anemia in chronic kidney disease: Secondary | ICD-10-CM | POA: Diagnosis not present

## 2015-11-05 DIAGNOSIS — N186 End stage renal disease: Secondary | ICD-10-CM | POA: Diagnosis not present

## 2015-11-05 DIAGNOSIS — E119 Type 2 diabetes mellitus without complications: Secondary | ICD-10-CM | POA: Diagnosis not present

## 2015-11-05 DIAGNOSIS — D509 Iron deficiency anemia, unspecified: Secondary | ICD-10-CM | POA: Diagnosis not present

## 2015-11-07 DIAGNOSIS — D631 Anemia in chronic kidney disease: Secondary | ICD-10-CM | POA: Diagnosis not present

## 2015-11-07 DIAGNOSIS — N186 End stage renal disease: Secondary | ICD-10-CM | POA: Diagnosis not present

## 2015-11-07 DIAGNOSIS — E119 Type 2 diabetes mellitus without complications: Secondary | ICD-10-CM | POA: Diagnosis not present

## 2015-11-07 DIAGNOSIS — D509 Iron deficiency anemia, unspecified: Secondary | ICD-10-CM | POA: Diagnosis not present

## 2015-11-09 DIAGNOSIS — D631 Anemia in chronic kidney disease: Secondary | ICD-10-CM | POA: Diagnosis not present

## 2015-11-09 DIAGNOSIS — N186 End stage renal disease: Secondary | ICD-10-CM | POA: Diagnosis not present

## 2015-11-09 DIAGNOSIS — E119 Type 2 diabetes mellitus without complications: Secondary | ICD-10-CM | POA: Diagnosis not present

## 2015-11-09 DIAGNOSIS — D509 Iron deficiency anemia, unspecified: Secondary | ICD-10-CM | POA: Diagnosis not present

## 2015-11-12 DIAGNOSIS — E119 Type 2 diabetes mellitus without complications: Secondary | ICD-10-CM | POA: Diagnosis not present

## 2015-11-12 DIAGNOSIS — D509 Iron deficiency anemia, unspecified: Secondary | ICD-10-CM | POA: Diagnosis not present

## 2015-11-12 DIAGNOSIS — N186 End stage renal disease: Secondary | ICD-10-CM | POA: Diagnosis not present

## 2015-11-12 DIAGNOSIS — D631 Anemia in chronic kidney disease: Secondary | ICD-10-CM | POA: Diagnosis not present

## 2015-11-14 DIAGNOSIS — D509 Iron deficiency anemia, unspecified: Secondary | ICD-10-CM | POA: Diagnosis not present

## 2015-11-14 DIAGNOSIS — E119 Type 2 diabetes mellitus without complications: Secondary | ICD-10-CM | POA: Diagnosis not present

## 2015-11-14 DIAGNOSIS — D631 Anemia in chronic kidney disease: Secondary | ICD-10-CM | POA: Diagnosis not present

## 2015-11-14 DIAGNOSIS — N186 End stage renal disease: Secondary | ICD-10-CM | POA: Diagnosis not present

## 2015-11-16 DIAGNOSIS — E119 Type 2 diabetes mellitus without complications: Secondary | ICD-10-CM | POA: Diagnosis not present

## 2015-11-16 DIAGNOSIS — D631 Anemia in chronic kidney disease: Secondary | ICD-10-CM | POA: Diagnosis not present

## 2015-11-16 DIAGNOSIS — D509 Iron deficiency anemia, unspecified: Secondary | ICD-10-CM | POA: Diagnosis not present

## 2015-11-16 DIAGNOSIS — N186 End stage renal disease: Secondary | ICD-10-CM | POA: Diagnosis not present

## 2015-11-17 DIAGNOSIS — Z992 Dependence on renal dialysis: Secondary | ICD-10-CM | POA: Diagnosis not present

## 2015-11-17 DIAGNOSIS — N186 End stage renal disease: Secondary | ICD-10-CM | POA: Diagnosis not present

## 2015-11-17 DIAGNOSIS — E1129 Type 2 diabetes mellitus with other diabetic kidney complication: Secondary | ICD-10-CM | POA: Diagnosis not present

## 2015-11-19 DIAGNOSIS — N2581 Secondary hyperparathyroidism of renal origin: Secondary | ICD-10-CM | POA: Diagnosis not present

## 2015-11-19 DIAGNOSIS — E119 Type 2 diabetes mellitus without complications: Secondary | ICD-10-CM | POA: Diagnosis not present

## 2015-11-19 DIAGNOSIS — D631 Anemia in chronic kidney disease: Secondary | ICD-10-CM | POA: Diagnosis not present

## 2015-11-19 DIAGNOSIS — N186 End stage renal disease: Secondary | ICD-10-CM | POA: Diagnosis not present

## 2015-11-21 DIAGNOSIS — E119 Type 2 diabetes mellitus without complications: Secondary | ICD-10-CM | POA: Diagnosis not present

## 2015-11-21 DIAGNOSIS — N2581 Secondary hyperparathyroidism of renal origin: Secondary | ICD-10-CM | POA: Diagnosis not present

## 2015-11-21 DIAGNOSIS — D631 Anemia in chronic kidney disease: Secondary | ICD-10-CM | POA: Diagnosis not present

## 2015-11-21 DIAGNOSIS — N186 End stage renal disease: Secondary | ICD-10-CM | POA: Diagnosis not present

## 2015-11-23 DIAGNOSIS — D631 Anemia in chronic kidney disease: Secondary | ICD-10-CM | POA: Diagnosis not present

## 2015-11-23 DIAGNOSIS — E119 Type 2 diabetes mellitus without complications: Secondary | ICD-10-CM | POA: Diagnosis not present

## 2015-11-23 DIAGNOSIS — N2581 Secondary hyperparathyroidism of renal origin: Secondary | ICD-10-CM | POA: Diagnosis not present

## 2015-11-23 DIAGNOSIS — N186 End stage renal disease: Secondary | ICD-10-CM | POA: Diagnosis not present

## 2015-11-26 DIAGNOSIS — N186 End stage renal disease: Secondary | ICD-10-CM | POA: Diagnosis not present

## 2015-11-26 DIAGNOSIS — N2581 Secondary hyperparathyroidism of renal origin: Secondary | ICD-10-CM | POA: Diagnosis not present

## 2015-11-26 DIAGNOSIS — E119 Type 2 diabetes mellitus without complications: Secondary | ICD-10-CM | POA: Diagnosis not present

## 2015-11-26 DIAGNOSIS — D631 Anemia in chronic kidney disease: Secondary | ICD-10-CM | POA: Diagnosis not present

## 2015-11-28 DIAGNOSIS — D631 Anemia in chronic kidney disease: Secondary | ICD-10-CM | POA: Diagnosis not present

## 2015-11-28 DIAGNOSIS — E119 Type 2 diabetes mellitus without complications: Secondary | ICD-10-CM | POA: Diagnosis not present

## 2015-11-28 DIAGNOSIS — N2581 Secondary hyperparathyroidism of renal origin: Secondary | ICD-10-CM | POA: Diagnosis not present

## 2015-11-28 DIAGNOSIS — N186 End stage renal disease: Secondary | ICD-10-CM | POA: Diagnosis not present

## 2015-11-30 DIAGNOSIS — N186 End stage renal disease: Secondary | ICD-10-CM | POA: Diagnosis not present

## 2015-11-30 DIAGNOSIS — E119 Type 2 diabetes mellitus without complications: Secondary | ICD-10-CM | POA: Diagnosis not present

## 2015-11-30 DIAGNOSIS — N2581 Secondary hyperparathyroidism of renal origin: Secondary | ICD-10-CM | POA: Diagnosis not present

## 2015-11-30 DIAGNOSIS — D631 Anemia in chronic kidney disease: Secondary | ICD-10-CM | POA: Diagnosis not present

## 2015-12-03 DIAGNOSIS — E119 Type 2 diabetes mellitus without complications: Secondary | ICD-10-CM | POA: Diagnosis not present

## 2015-12-03 DIAGNOSIS — N2581 Secondary hyperparathyroidism of renal origin: Secondary | ICD-10-CM | POA: Diagnosis not present

## 2015-12-03 DIAGNOSIS — N186 End stage renal disease: Secondary | ICD-10-CM | POA: Diagnosis not present

## 2015-12-03 DIAGNOSIS — D631 Anemia in chronic kidney disease: Secondary | ICD-10-CM | POA: Diagnosis not present

## 2015-12-05 DIAGNOSIS — E119 Type 2 diabetes mellitus without complications: Secondary | ICD-10-CM | POA: Diagnosis not present

## 2015-12-05 DIAGNOSIS — D631 Anemia in chronic kidney disease: Secondary | ICD-10-CM | POA: Diagnosis not present

## 2015-12-05 DIAGNOSIS — N186 End stage renal disease: Secondary | ICD-10-CM | POA: Diagnosis not present

## 2015-12-05 DIAGNOSIS — N2581 Secondary hyperparathyroidism of renal origin: Secondary | ICD-10-CM | POA: Diagnosis not present

## 2015-12-07 DIAGNOSIS — N186 End stage renal disease: Secondary | ICD-10-CM | POA: Diagnosis not present

## 2015-12-07 DIAGNOSIS — N2581 Secondary hyperparathyroidism of renal origin: Secondary | ICD-10-CM | POA: Diagnosis not present

## 2015-12-07 DIAGNOSIS — E119 Type 2 diabetes mellitus without complications: Secondary | ICD-10-CM | POA: Diagnosis not present

## 2015-12-07 DIAGNOSIS — D631 Anemia in chronic kidney disease: Secondary | ICD-10-CM | POA: Diagnosis not present

## 2015-12-10 DIAGNOSIS — N186 End stage renal disease: Secondary | ICD-10-CM | POA: Diagnosis not present

## 2015-12-10 DIAGNOSIS — E119 Type 2 diabetes mellitus without complications: Secondary | ICD-10-CM | POA: Diagnosis not present

## 2015-12-10 DIAGNOSIS — D631 Anemia in chronic kidney disease: Secondary | ICD-10-CM | POA: Diagnosis not present

## 2015-12-10 DIAGNOSIS — N2581 Secondary hyperparathyroidism of renal origin: Secondary | ICD-10-CM | POA: Diagnosis not present

## 2015-12-12 DIAGNOSIS — N186 End stage renal disease: Secondary | ICD-10-CM | POA: Diagnosis not present

## 2015-12-12 DIAGNOSIS — N2581 Secondary hyperparathyroidism of renal origin: Secondary | ICD-10-CM | POA: Diagnosis not present

## 2015-12-12 DIAGNOSIS — D631 Anemia in chronic kidney disease: Secondary | ICD-10-CM | POA: Diagnosis not present

## 2015-12-12 DIAGNOSIS — E119 Type 2 diabetes mellitus without complications: Secondary | ICD-10-CM | POA: Diagnosis not present

## 2015-12-14 DIAGNOSIS — E119 Type 2 diabetes mellitus without complications: Secondary | ICD-10-CM | POA: Diagnosis not present

## 2015-12-14 DIAGNOSIS — N186 End stage renal disease: Secondary | ICD-10-CM | POA: Diagnosis not present

## 2015-12-14 DIAGNOSIS — D631 Anemia in chronic kidney disease: Secondary | ICD-10-CM | POA: Diagnosis not present

## 2015-12-14 DIAGNOSIS — N2581 Secondary hyperparathyroidism of renal origin: Secondary | ICD-10-CM | POA: Diagnosis not present

## 2015-12-17 DIAGNOSIS — E119 Type 2 diabetes mellitus without complications: Secondary | ICD-10-CM | POA: Diagnosis not present

## 2015-12-17 DIAGNOSIS — N2581 Secondary hyperparathyroidism of renal origin: Secondary | ICD-10-CM | POA: Diagnosis not present

## 2015-12-17 DIAGNOSIS — N186 End stage renal disease: Secondary | ICD-10-CM | POA: Diagnosis not present

## 2015-12-17 DIAGNOSIS — D631 Anemia in chronic kidney disease: Secondary | ICD-10-CM | POA: Diagnosis not present

## 2015-12-18 DIAGNOSIS — Z992 Dependence on renal dialysis: Secondary | ICD-10-CM | POA: Diagnosis not present

## 2015-12-18 DIAGNOSIS — E1129 Type 2 diabetes mellitus with other diabetic kidney complication: Secondary | ICD-10-CM | POA: Diagnosis not present

## 2015-12-18 DIAGNOSIS — N186 End stage renal disease: Secondary | ICD-10-CM | POA: Diagnosis not present

## 2015-12-19 DIAGNOSIS — N2581 Secondary hyperparathyroidism of renal origin: Secondary | ICD-10-CM | POA: Diagnosis not present

## 2015-12-19 DIAGNOSIS — D631 Anemia in chronic kidney disease: Secondary | ICD-10-CM | POA: Diagnosis not present

## 2015-12-19 DIAGNOSIS — E119 Type 2 diabetes mellitus without complications: Secondary | ICD-10-CM | POA: Diagnosis not present

## 2015-12-19 DIAGNOSIS — N186 End stage renal disease: Secondary | ICD-10-CM | POA: Diagnosis not present

## 2015-12-21 DIAGNOSIS — D631 Anemia in chronic kidney disease: Secondary | ICD-10-CM | POA: Diagnosis not present

## 2015-12-21 DIAGNOSIS — E119 Type 2 diabetes mellitus without complications: Secondary | ICD-10-CM | POA: Diagnosis not present

## 2015-12-21 DIAGNOSIS — N186 End stage renal disease: Secondary | ICD-10-CM | POA: Diagnosis not present

## 2015-12-21 DIAGNOSIS — N2581 Secondary hyperparathyroidism of renal origin: Secondary | ICD-10-CM | POA: Diagnosis not present

## 2015-12-24 DIAGNOSIS — N2581 Secondary hyperparathyroidism of renal origin: Secondary | ICD-10-CM | POA: Diagnosis not present

## 2015-12-24 DIAGNOSIS — D631 Anemia in chronic kidney disease: Secondary | ICD-10-CM | POA: Diagnosis not present

## 2015-12-24 DIAGNOSIS — E119 Type 2 diabetes mellitus without complications: Secondary | ICD-10-CM | POA: Diagnosis not present

## 2015-12-24 DIAGNOSIS — N186 End stage renal disease: Secondary | ICD-10-CM | POA: Diagnosis not present

## 2015-12-26 DIAGNOSIS — N186 End stage renal disease: Secondary | ICD-10-CM | POA: Diagnosis not present

## 2015-12-26 DIAGNOSIS — E119 Type 2 diabetes mellitus without complications: Secondary | ICD-10-CM | POA: Diagnosis not present

## 2015-12-26 DIAGNOSIS — N2581 Secondary hyperparathyroidism of renal origin: Secondary | ICD-10-CM | POA: Diagnosis not present

## 2015-12-26 DIAGNOSIS — D631 Anemia in chronic kidney disease: Secondary | ICD-10-CM | POA: Diagnosis not present

## 2015-12-28 DIAGNOSIS — E119 Type 2 diabetes mellitus without complications: Secondary | ICD-10-CM | POA: Diagnosis not present

## 2015-12-28 DIAGNOSIS — D631 Anemia in chronic kidney disease: Secondary | ICD-10-CM | POA: Diagnosis not present

## 2015-12-28 DIAGNOSIS — N186 End stage renal disease: Secondary | ICD-10-CM | POA: Diagnosis not present

## 2015-12-28 DIAGNOSIS — N2581 Secondary hyperparathyroidism of renal origin: Secondary | ICD-10-CM | POA: Diagnosis not present

## 2015-12-31 DIAGNOSIS — D631 Anemia in chronic kidney disease: Secondary | ICD-10-CM | POA: Diagnosis not present

## 2015-12-31 DIAGNOSIS — N2581 Secondary hyperparathyroidism of renal origin: Secondary | ICD-10-CM | POA: Diagnosis not present

## 2015-12-31 DIAGNOSIS — N186 End stage renal disease: Secondary | ICD-10-CM | POA: Diagnosis not present

## 2015-12-31 DIAGNOSIS — E119 Type 2 diabetes mellitus without complications: Secondary | ICD-10-CM | POA: Diagnosis not present

## 2016-01-02 DIAGNOSIS — N186 End stage renal disease: Secondary | ICD-10-CM | POA: Diagnosis not present

## 2016-01-02 DIAGNOSIS — E119 Type 2 diabetes mellitus without complications: Secondary | ICD-10-CM | POA: Diagnosis not present

## 2016-01-02 DIAGNOSIS — N2581 Secondary hyperparathyroidism of renal origin: Secondary | ICD-10-CM | POA: Diagnosis not present

## 2016-01-02 DIAGNOSIS — D631 Anemia in chronic kidney disease: Secondary | ICD-10-CM | POA: Diagnosis not present

## 2016-01-04 DIAGNOSIS — N186 End stage renal disease: Secondary | ICD-10-CM | POA: Diagnosis not present

## 2016-01-04 DIAGNOSIS — N2581 Secondary hyperparathyroidism of renal origin: Secondary | ICD-10-CM | POA: Diagnosis not present

## 2016-01-04 DIAGNOSIS — D631 Anemia in chronic kidney disease: Secondary | ICD-10-CM | POA: Diagnosis not present

## 2016-01-04 DIAGNOSIS — E119 Type 2 diabetes mellitus without complications: Secondary | ICD-10-CM | POA: Diagnosis not present

## 2016-01-07 DIAGNOSIS — N186 End stage renal disease: Secondary | ICD-10-CM | POA: Diagnosis not present

## 2016-01-07 DIAGNOSIS — N2581 Secondary hyperparathyroidism of renal origin: Secondary | ICD-10-CM | POA: Diagnosis not present

## 2016-01-07 DIAGNOSIS — E119 Type 2 diabetes mellitus without complications: Secondary | ICD-10-CM | POA: Diagnosis not present

## 2016-01-07 DIAGNOSIS — D631 Anemia in chronic kidney disease: Secondary | ICD-10-CM | POA: Diagnosis not present

## 2016-01-09 DIAGNOSIS — N186 End stage renal disease: Secondary | ICD-10-CM | POA: Diagnosis not present

## 2016-01-09 DIAGNOSIS — E119 Type 2 diabetes mellitus without complications: Secondary | ICD-10-CM | POA: Diagnosis not present

## 2016-01-09 DIAGNOSIS — N2581 Secondary hyperparathyroidism of renal origin: Secondary | ICD-10-CM | POA: Diagnosis not present

## 2016-01-09 DIAGNOSIS — D631 Anemia in chronic kidney disease: Secondary | ICD-10-CM | POA: Diagnosis not present

## 2016-01-11 DIAGNOSIS — N186 End stage renal disease: Secondary | ICD-10-CM | POA: Diagnosis not present

## 2016-01-11 DIAGNOSIS — E119 Type 2 diabetes mellitus without complications: Secondary | ICD-10-CM | POA: Diagnosis not present

## 2016-01-11 DIAGNOSIS — N2581 Secondary hyperparathyroidism of renal origin: Secondary | ICD-10-CM | POA: Diagnosis not present

## 2016-01-11 DIAGNOSIS — D631 Anemia in chronic kidney disease: Secondary | ICD-10-CM | POA: Diagnosis not present

## 2016-01-14 DIAGNOSIS — N186 End stage renal disease: Secondary | ICD-10-CM | POA: Diagnosis not present

## 2016-01-14 DIAGNOSIS — D631 Anemia in chronic kidney disease: Secondary | ICD-10-CM | POA: Diagnosis not present

## 2016-01-14 DIAGNOSIS — E119 Type 2 diabetes mellitus without complications: Secondary | ICD-10-CM | POA: Diagnosis not present

## 2016-01-14 DIAGNOSIS — N2581 Secondary hyperparathyroidism of renal origin: Secondary | ICD-10-CM | POA: Diagnosis not present

## 2016-01-16 DIAGNOSIS — D631 Anemia in chronic kidney disease: Secondary | ICD-10-CM | POA: Diagnosis not present

## 2016-01-16 DIAGNOSIS — N186 End stage renal disease: Secondary | ICD-10-CM | POA: Diagnosis not present

## 2016-01-16 DIAGNOSIS — N2581 Secondary hyperparathyroidism of renal origin: Secondary | ICD-10-CM | POA: Diagnosis not present

## 2016-01-16 DIAGNOSIS — E119 Type 2 diabetes mellitus without complications: Secondary | ICD-10-CM | POA: Diagnosis not present

## 2016-01-17 DIAGNOSIS — E1129 Type 2 diabetes mellitus with other diabetic kidney complication: Secondary | ICD-10-CM | POA: Diagnosis not present

## 2016-01-17 DIAGNOSIS — N186 End stage renal disease: Secondary | ICD-10-CM | POA: Diagnosis not present

## 2016-01-17 DIAGNOSIS — Z992 Dependence on renal dialysis: Secondary | ICD-10-CM | POA: Diagnosis not present

## 2016-01-18 DIAGNOSIS — N186 End stage renal disease: Secondary | ICD-10-CM | POA: Diagnosis not present

## 2016-01-18 DIAGNOSIS — E119 Type 2 diabetes mellitus without complications: Secondary | ICD-10-CM | POA: Diagnosis not present

## 2016-01-18 DIAGNOSIS — D631 Anemia in chronic kidney disease: Secondary | ICD-10-CM | POA: Diagnosis not present

## 2016-01-18 DIAGNOSIS — N2581 Secondary hyperparathyroidism of renal origin: Secondary | ICD-10-CM | POA: Diagnosis not present

## 2016-01-21 DIAGNOSIS — N2581 Secondary hyperparathyroidism of renal origin: Secondary | ICD-10-CM | POA: Diagnosis not present

## 2016-01-21 DIAGNOSIS — N186 End stage renal disease: Secondary | ICD-10-CM | POA: Diagnosis not present

## 2016-01-21 DIAGNOSIS — D631 Anemia in chronic kidney disease: Secondary | ICD-10-CM | POA: Diagnosis not present

## 2016-01-21 DIAGNOSIS — E119 Type 2 diabetes mellitus without complications: Secondary | ICD-10-CM | POA: Diagnosis not present

## 2016-01-23 DIAGNOSIS — E119 Type 2 diabetes mellitus without complications: Secondary | ICD-10-CM | POA: Diagnosis not present

## 2016-01-23 DIAGNOSIS — N186 End stage renal disease: Secondary | ICD-10-CM | POA: Diagnosis not present

## 2016-01-23 DIAGNOSIS — D631 Anemia in chronic kidney disease: Secondary | ICD-10-CM | POA: Diagnosis not present

## 2016-01-23 DIAGNOSIS — N2581 Secondary hyperparathyroidism of renal origin: Secondary | ICD-10-CM | POA: Diagnosis not present

## 2016-01-25 DIAGNOSIS — N186 End stage renal disease: Secondary | ICD-10-CM | POA: Diagnosis not present

## 2016-01-25 DIAGNOSIS — D631 Anemia in chronic kidney disease: Secondary | ICD-10-CM | POA: Diagnosis not present

## 2016-01-25 DIAGNOSIS — E119 Type 2 diabetes mellitus without complications: Secondary | ICD-10-CM | POA: Diagnosis not present

## 2016-01-25 DIAGNOSIS — N2581 Secondary hyperparathyroidism of renal origin: Secondary | ICD-10-CM | POA: Diagnosis not present

## 2016-01-28 DIAGNOSIS — E119 Type 2 diabetes mellitus without complications: Secondary | ICD-10-CM | POA: Diagnosis not present

## 2016-01-28 DIAGNOSIS — N2581 Secondary hyperparathyroidism of renal origin: Secondary | ICD-10-CM | POA: Diagnosis not present

## 2016-01-28 DIAGNOSIS — N186 End stage renal disease: Secondary | ICD-10-CM | POA: Diagnosis not present

## 2016-01-28 DIAGNOSIS — D631 Anemia in chronic kidney disease: Secondary | ICD-10-CM | POA: Diagnosis not present

## 2016-01-30 DIAGNOSIS — E119 Type 2 diabetes mellitus without complications: Secondary | ICD-10-CM | POA: Diagnosis not present

## 2016-01-30 DIAGNOSIS — N2581 Secondary hyperparathyroidism of renal origin: Secondary | ICD-10-CM | POA: Diagnosis not present

## 2016-01-30 DIAGNOSIS — N186 End stage renal disease: Secondary | ICD-10-CM | POA: Diagnosis not present

## 2016-01-30 DIAGNOSIS — D631 Anemia in chronic kidney disease: Secondary | ICD-10-CM | POA: Diagnosis not present

## 2016-02-01 DIAGNOSIS — N186 End stage renal disease: Secondary | ICD-10-CM | POA: Diagnosis not present

## 2016-02-01 DIAGNOSIS — N2581 Secondary hyperparathyroidism of renal origin: Secondary | ICD-10-CM | POA: Diagnosis not present

## 2016-02-01 DIAGNOSIS — D631 Anemia in chronic kidney disease: Secondary | ICD-10-CM | POA: Diagnosis not present

## 2016-02-01 DIAGNOSIS — E119 Type 2 diabetes mellitus without complications: Secondary | ICD-10-CM | POA: Diagnosis not present

## 2016-02-04 DIAGNOSIS — D631 Anemia in chronic kidney disease: Secondary | ICD-10-CM | POA: Diagnosis not present

## 2016-02-04 DIAGNOSIS — E119 Type 2 diabetes mellitus without complications: Secondary | ICD-10-CM | POA: Diagnosis not present

## 2016-02-04 DIAGNOSIS — N2581 Secondary hyperparathyroidism of renal origin: Secondary | ICD-10-CM | POA: Diagnosis not present

## 2016-02-04 DIAGNOSIS — N186 End stage renal disease: Secondary | ICD-10-CM | POA: Diagnosis not present

## 2016-02-06 DIAGNOSIS — D631 Anemia in chronic kidney disease: Secondary | ICD-10-CM | POA: Diagnosis not present

## 2016-02-06 DIAGNOSIS — N186 End stage renal disease: Secondary | ICD-10-CM | POA: Diagnosis not present

## 2016-02-06 DIAGNOSIS — E119 Type 2 diabetes mellitus without complications: Secondary | ICD-10-CM | POA: Diagnosis not present

## 2016-02-06 DIAGNOSIS — N2581 Secondary hyperparathyroidism of renal origin: Secondary | ICD-10-CM | POA: Diagnosis not present

## 2016-02-08 DIAGNOSIS — E119 Type 2 diabetes mellitus without complications: Secondary | ICD-10-CM | POA: Diagnosis not present

## 2016-02-08 DIAGNOSIS — D631 Anemia in chronic kidney disease: Secondary | ICD-10-CM | POA: Diagnosis not present

## 2016-02-08 DIAGNOSIS — N2581 Secondary hyperparathyroidism of renal origin: Secondary | ICD-10-CM | POA: Diagnosis not present

## 2016-02-08 DIAGNOSIS — N186 End stage renal disease: Secondary | ICD-10-CM | POA: Diagnosis not present

## 2016-02-11 DIAGNOSIS — D631 Anemia in chronic kidney disease: Secondary | ICD-10-CM | POA: Diagnosis not present

## 2016-02-11 DIAGNOSIS — N2581 Secondary hyperparathyroidism of renal origin: Secondary | ICD-10-CM | POA: Diagnosis not present

## 2016-02-11 DIAGNOSIS — N186 End stage renal disease: Secondary | ICD-10-CM | POA: Diagnosis not present

## 2016-02-11 DIAGNOSIS — E119 Type 2 diabetes mellitus without complications: Secondary | ICD-10-CM | POA: Diagnosis not present

## 2016-02-13 DIAGNOSIS — N186 End stage renal disease: Secondary | ICD-10-CM | POA: Diagnosis not present

## 2016-02-13 DIAGNOSIS — E119 Type 2 diabetes mellitus without complications: Secondary | ICD-10-CM | POA: Diagnosis not present

## 2016-02-13 DIAGNOSIS — D631 Anemia in chronic kidney disease: Secondary | ICD-10-CM | POA: Diagnosis not present

## 2016-02-13 DIAGNOSIS — N2581 Secondary hyperparathyroidism of renal origin: Secondary | ICD-10-CM | POA: Diagnosis not present

## 2016-02-15 DIAGNOSIS — N2581 Secondary hyperparathyroidism of renal origin: Secondary | ICD-10-CM | POA: Diagnosis not present

## 2016-02-15 DIAGNOSIS — E119 Type 2 diabetes mellitus without complications: Secondary | ICD-10-CM | POA: Diagnosis not present

## 2016-02-15 DIAGNOSIS — N186 End stage renal disease: Secondary | ICD-10-CM | POA: Diagnosis not present

## 2016-02-15 DIAGNOSIS — D631 Anemia in chronic kidney disease: Secondary | ICD-10-CM | POA: Diagnosis not present

## 2016-02-17 DIAGNOSIS — Z992 Dependence on renal dialysis: Secondary | ICD-10-CM | POA: Diagnosis not present

## 2016-02-17 DIAGNOSIS — E1129 Type 2 diabetes mellitus with other diabetic kidney complication: Secondary | ICD-10-CM | POA: Diagnosis not present

## 2016-02-17 DIAGNOSIS — N186 End stage renal disease: Secondary | ICD-10-CM | POA: Diagnosis not present

## 2016-02-18 DIAGNOSIS — N2581 Secondary hyperparathyroidism of renal origin: Secondary | ICD-10-CM | POA: Diagnosis not present

## 2016-02-18 DIAGNOSIS — E119 Type 2 diabetes mellitus without complications: Secondary | ICD-10-CM | POA: Diagnosis not present

## 2016-02-18 DIAGNOSIS — N186 End stage renal disease: Secondary | ICD-10-CM | POA: Diagnosis not present

## 2016-02-18 DIAGNOSIS — D631 Anemia in chronic kidney disease: Secondary | ICD-10-CM | POA: Diagnosis not present

## 2016-02-20 DIAGNOSIS — N2581 Secondary hyperparathyroidism of renal origin: Secondary | ICD-10-CM | POA: Diagnosis not present

## 2016-02-20 DIAGNOSIS — E119 Type 2 diabetes mellitus without complications: Secondary | ICD-10-CM | POA: Diagnosis not present

## 2016-02-20 DIAGNOSIS — D631 Anemia in chronic kidney disease: Secondary | ICD-10-CM | POA: Diagnosis not present

## 2016-02-20 DIAGNOSIS — N186 End stage renal disease: Secondary | ICD-10-CM | POA: Diagnosis not present

## 2016-02-22 DIAGNOSIS — N186 End stage renal disease: Secondary | ICD-10-CM | POA: Diagnosis not present

## 2016-02-22 DIAGNOSIS — N2581 Secondary hyperparathyroidism of renal origin: Secondary | ICD-10-CM | POA: Diagnosis not present

## 2016-02-22 DIAGNOSIS — E119 Type 2 diabetes mellitus without complications: Secondary | ICD-10-CM | POA: Diagnosis not present

## 2016-02-22 DIAGNOSIS — D631 Anemia in chronic kidney disease: Secondary | ICD-10-CM | POA: Diagnosis not present

## 2016-02-25 DIAGNOSIS — N2581 Secondary hyperparathyroidism of renal origin: Secondary | ICD-10-CM | POA: Diagnosis not present

## 2016-02-25 DIAGNOSIS — D631 Anemia in chronic kidney disease: Secondary | ICD-10-CM | POA: Diagnosis not present

## 2016-02-25 DIAGNOSIS — E119 Type 2 diabetes mellitus without complications: Secondary | ICD-10-CM | POA: Diagnosis not present

## 2016-02-25 DIAGNOSIS — N186 End stage renal disease: Secondary | ICD-10-CM | POA: Diagnosis not present

## 2016-02-27 DIAGNOSIS — N2581 Secondary hyperparathyroidism of renal origin: Secondary | ICD-10-CM | POA: Diagnosis not present

## 2016-02-27 DIAGNOSIS — N186 End stage renal disease: Secondary | ICD-10-CM | POA: Diagnosis not present

## 2016-02-27 DIAGNOSIS — E119 Type 2 diabetes mellitus without complications: Secondary | ICD-10-CM | POA: Diagnosis not present

## 2016-02-27 DIAGNOSIS — D631 Anemia in chronic kidney disease: Secondary | ICD-10-CM | POA: Diagnosis not present

## 2016-02-29 DIAGNOSIS — D631 Anemia in chronic kidney disease: Secondary | ICD-10-CM | POA: Diagnosis not present

## 2016-02-29 DIAGNOSIS — E119 Type 2 diabetes mellitus without complications: Secondary | ICD-10-CM | POA: Diagnosis not present

## 2016-02-29 DIAGNOSIS — N2581 Secondary hyperparathyroidism of renal origin: Secondary | ICD-10-CM | POA: Diagnosis not present

## 2016-02-29 DIAGNOSIS — N186 End stage renal disease: Secondary | ICD-10-CM | POA: Diagnosis not present

## 2016-03-03 DIAGNOSIS — N2581 Secondary hyperparathyroidism of renal origin: Secondary | ICD-10-CM | POA: Diagnosis not present

## 2016-03-03 DIAGNOSIS — N186 End stage renal disease: Secondary | ICD-10-CM | POA: Diagnosis not present

## 2016-03-03 DIAGNOSIS — D631 Anemia in chronic kidney disease: Secondary | ICD-10-CM | POA: Diagnosis not present

## 2016-03-03 DIAGNOSIS — E119 Type 2 diabetes mellitus without complications: Secondary | ICD-10-CM | POA: Diagnosis not present

## 2016-03-05 DIAGNOSIS — E119 Type 2 diabetes mellitus without complications: Secondary | ICD-10-CM | POA: Diagnosis not present

## 2016-03-05 DIAGNOSIS — N2581 Secondary hyperparathyroidism of renal origin: Secondary | ICD-10-CM | POA: Diagnosis not present

## 2016-03-05 DIAGNOSIS — N186 End stage renal disease: Secondary | ICD-10-CM | POA: Diagnosis not present

## 2016-03-05 DIAGNOSIS — D631 Anemia in chronic kidney disease: Secondary | ICD-10-CM | POA: Diagnosis not present

## 2016-03-07 DIAGNOSIS — D631 Anemia in chronic kidney disease: Secondary | ICD-10-CM | POA: Diagnosis not present

## 2016-03-07 DIAGNOSIS — N2581 Secondary hyperparathyroidism of renal origin: Secondary | ICD-10-CM | POA: Diagnosis not present

## 2016-03-07 DIAGNOSIS — N186 End stage renal disease: Secondary | ICD-10-CM | POA: Diagnosis not present

## 2016-03-07 DIAGNOSIS — E119 Type 2 diabetes mellitus without complications: Secondary | ICD-10-CM | POA: Diagnosis not present

## 2016-03-10 DIAGNOSIS — D631 Anemia in chronic kidney disease: Secondary | ICD-10-CM | POA: Diagnosis not present

## 2016-03-10 DIAGNOSIS — N2581 Secondary hyperparathyroidism of renal origin: Secondary | ICD-10-CM | POA: Diagnosis not present

## 2016-03-10 DIAGNOSIS — E119 Type 2 diabetes mellitus without complications: Secondary | ICD-10-CM | POA: Diagnosis not present

## 2016-03-10 DIAGNOSIS — N186 End stage renal disease: Secondary | ICD-10-CM | POA: Diagnosis not present

## 2016-03-11 DIAGNOSIS — E1129 Type 2 diabetes mellitus with other diabetic kidney complication: Secondary | ICD-10-CM | POA: Diagnosis not present

## 2016-03-11 DIAGNOSIS — I10 Essential (primary) hypertension: Secondary | ICD-10-CM | POA: Diagnosis not present

## 2016-03-11 DIAGNOSIS — E1165 Type 2 diabetes mellitus with hyperglycemia: Secondary | ICD-10-CM | POA: Diagnosis not present

## 2016-03-11 DIAGNOSIS — E1159 Type 2 diabetes mellitus with other circulatory complications: Secondary | ICD-10-CM | POA: Diagnosis not present

## 2016-03-12 DIAGNOSIS — N186 End stage renal disease: Secondary | ICD-10-CM | POA: Diagnosis not present

## 2016-03-12 DIAGNOSIS — D631 Anemia in chronic kidney disease: Secondary | ICD-10-CM | POA: Diagnosis not present

## 2016-03-12 DIAGNOSIS — N2581 Secondary hyperparathyroidism of renal origin: Secondary | ICD-10-CM | POA: Diagnosis not present

## 2016-03-12 DIAGNOSIS — E119 Type 2 diabetes mellitus without complications: Secondary | ICD-10-CM | POA: Diagnosis not present

## 2016-03-14 DIAGNOSIS — N186 End stage renal disease: Secondary | ICD-10-CM | POA: Diagnosis not present

## 2016-03-14 DIAGNOSIS — N2581 Secondary hyperparathyroidism of renal origin: Secondary | ICD-10-CM | POA: Diagnosis not present

## 2016-03-14 DIAGNOSIS — D631 Anemia in chronic kidney disease: Secondary | ICD-10-CM | POA: Diagnosis not present

## 2016-03-14 DIAGNOSIS — E119 Type 2 diabetes mellitus without complications: Secondary | ICD-10-CM | POA: Diagnosis not present

## 2016-03-17 DIAGNOSIS — E119 Type 2 diabetes mellitus without complications: Secondary | ICD-10-CM | POA: Diagnosis not present

## 2016-03-17 DIAGNOSIS — N2581 Secondary hyperparathyroidism of renal origin: Secondary | ICD-10-CM | POA: Diagnosis not present

## 2016-03-17 DIAGNOSIS — D631 Anemia in chronic kidney disease: Secondary | ICD-10-CM | POA: Diagnosis not present

## 2016-03-17 DIAGNOSIS — N186 End stage renal disease: Secondary | ICD-10-CM | POA: Diagnosis not present

## 2016-03-19 DIAGNOSIS — E119 Type 2 diabetes mellitus without complications: Secondary | ICD-10-CM | POA: Diagnosis not present

## 2016-03-19 DIAGNOSIS — N2581 Secondary hyperparathyroidism of renal origin: Secondary | ICD-10-CM | POA: Diagnosis not present

## 2016-03-19 DIAGNOSIS — N186 End stage renal disease: Secondary | ICD-10-CM | POA: Diagnosis not present

## 2016-03-19 DIAGNOSIS — D631 Anemia in chronic kidney disease: Secondary | ICD-10-CM | POA: Diagnosis not present

## 2016-03-19 DIAGNOSIS — Z992 Dependence on renal dialysis: Secondary | ICD-10-CM | POA: Diagnosis not present

## 2016-03-19 DIAGNOSIS — E1129 Type 2 diabetes mellitus with other diabetic kidney complication: Secondary | ICD-10-CM | POA: Diagnosis not present

## 2016-03-21 DIAGNOSIS — N2581 Secondary hyperparathyroidism of renal origin: Secondary | ICD-10-CM | POA: Diagnosis not present

## 2016-03-21 DIAGNOSIS — N186 End stage renal disease: Secondary | ICD-10-CM | POA: Diagnosis not present

## 2016-03-21 DIAGNOSIS — D631 Anemia in chronic kidney disease: Secondary | ICD-10-CM | POA: Diagnosis not present

## 2016-03-21 DIAGNOSIS — E119 Type 2 diabetes mellitus without complications: Secondary | ICD-10-CM | POA: Diagnosis not present

## 2016-03-24 DIAGNOSIS — N186 End stage renal disease: Secondary | ICD-10-CM | POA: Diagnosis not present

## 2016-03-24 DIAGNOSIS — N2581 Secondary hyperparathyroidism of renal origin: Secondary | ICD-10-CM | POA: Diagnosis not present

## 2016-03-24 DIAGNOSIS — D631 Anemia in chronic kidney disease: Secondary | ICD-10-CM | POA: Diagnosis not present

## 2016-03-24 DIAGNOSIS — E119 Type 2 diabetes mellitus without complications: Secondary | ICD-10-CM | POA: Diagnosis not present

## 2016-03-26 DIAGNOSIS — E119 Type 2 diabetes mellitus without complications: Secondary | ICD-10-CM | POA: Diagnosis not present

## 2016-03-26 DIAGNOSIS — N2581 Secondary hyperparathyroidism of renal origin: Secondary | ICD-10-CM | POA: Diagnosis not present

## 2016-03-26 DIAGNOSIS — D631 Anemia in chronic kidney disease: Secondary | ICD-10-CM | POA: Diagnosis not present

## 2016-03-26 DIAGNOSIS — N186 End stage renal disease: Secondary | ICD-10-CM | POA: Diagnosis not present

## 2016-03-28 DIAGNOSIS — E119 Type 2 diabetes mellitus without complications: Secondary | ICD-10-CM | POA: Diagnosis not present

## 2016-03-28 DIAGNOSIS — N186 End stage renal disease: Secondary | ICD-10-CM | POA: Diagnosis not present

## 2016-03-28 DIAGNOSIS — N2581 Secondary hyperparathyroidism of renal origin: Secondary | ICD-10-CM | POA: Diagnosis not present

## 2016-03-28 DIAGNOSIS — D631 Anemia in chronic kidney disease: Secondary | ICD-10-CM | POA: Diagnosis not present

## 2016-03-31 DIAGNOSIS — N2581 Secondary hyperparathyroidism of renal origin: Secondary | ICD-10-CM | POA: Diagnosis not present

## 2016-03-31 DIAGNOSIS — D631 Anemia in chronic kidney disease: Secondary | ICD-10-CM | POA: Diagnosis not present

## 2016-03-31 DIAGNOSIS — N186 End stage renal disease: Secondary | ICD-10-CM | POA: Diagnosis not present

## 2016-03-31 DIAGNOSIS — E119 Type 2 diabetes mellitus without complications: Secondary | ICD-10-CM | POA: Diagnosis not present

## 2016-04-02 DIAGNOSIS — N186 End stage renal disease: Secondary | ICD-10-CM | POA: Diagnosis not present

## 2016-04-02 DIAGNOSIS — E119 Type 2 diabetes mellitus without complications: Secondary | ICD-10-CM | POA: Diagnosis not present

## 2016-04-02 DIAGNOSIS — D631 Anemia in chronic kidney disease: Secondary | ICD-10-CM | POA: Diagnosis not present

## 2016-04-02 DIAGNOSIS — N2581 Secondary hyperparathyroidism of renal origin: Secondary | ICD-10-CM | POA: Diagnosis not present

## 2016-04-04 DIAGNOSIS — D631 Anemia in chronic kidney disease: Secondary | ICD-10-CM | POA: Diagnosis not present

## 2016-04-04 DIAGNOSIS — E119 Type 2 diabetes mellitus without complications: Secondary | ICD-10-CM | POA: Diagnosis not present

## 2016-04-04 DIAGNOSIS — N186 End stage renal disease: Secondary | ICD-10-CM | POA: Diagnosis not present

## 2016-04-04 DIAGNOSIS — N2581 Secondary hyperparathyroidism of renal origin: Secondary | ICD-10-CM | POA: Diagnosis not present

## 2016-04-07 DIAGNOSIS — N2581 Secondary hyperparathyroidism of renal origin: Secondary | ICD-10-CM | POA: Diagnosis not present

## 2016-04-07 DIAGNOSIS — N186 End stage renal disease: Secondary | ICD-10-CM | POA: Diagnosis not present

## 2016-04-07 DIAGNOSIS — E119 Type 2 diabetes mellitus without complications: Secondary | ICD-10-CM | POA: Diagnosis not present

## 2016-04-07 DIAGNOSIS — D631 Anemia in chronic kidney disease: Secondary | ICD-10-CM | POA: Diagnosis not present

## 2016-04-09 DIAGNOSIS — N2581 Secondary hyperparathyroidism of renal origin: Secondary | ICD-10-CM | POA: Diagnosis not present

## 2016-04-09 DIAGNOSIS — D631 Anemia in chronic kidney disease: Secondary | ICD-10-CM | POA: Diagnosis not present

## 2016-04-09 DIAGNOSIS — E119 Type 2 diabetes mellitus without complications: Secondary | ICD-10-CM | POA: Diagnosis not present

## 2016-04-09 DIAGNOSIS — N186 End stage renal disease: Secondary | ICD-10-CM | POA: Diagnosis not present

## 2016-04-11 DIAGNOSIS — N2581 Secondary hyperparathyroidism of renal origin: Secondary | ICD-10-CM | POA: Diagnosis not present

## 2016-04-11 DIAGNOSIS — E119 Type 2 diabetes mellitus without complications: Secondary | ICD-10-CM | POA: Diagnosis not present

## 2016-04-11 DIAGNOSIS — N186 End stage renal disease: Secondary | ICD-10-CM | POA: Diagnosis not present

## 2016-04-11 DIAGNOSIS — D631 Anemia in chronic kidney disease: Secondary | ICD-10-CM | POA: Diagnosis not present

## 2016-04-14 DIAGNOSIS — E119 Type 2 diabetes mellitus without complications: Secondary | ICD-10-CM | POA: Diagnosis not present

## 2016-04-14 DIAGNOSIS — N186 End stage renal disease: Secondary | ICD-10-CM | POA: Diagnosis not present

## 2016-04-14 DIAGNOSIS — N2581 Secondary hyperparathyroidism of renal origin: Secondary | ICD-10-CM | POA: Diagnosis not present

## 2016-04-14 DIAGNOSIS — D631 Anemia in chronic kidney disease: Secondary | ICD-10-CM | POA: Diagnosis not present

## 2016-04-16 DIAGNOSIS — N2581 Secondary hyperparathyroidism of renal origin: Secondary | ICD-10-CM | POA: Diagnosis not present

## 2016-04-16 DIAGNOSIS — E119 Type 2 diabetes mellitus without complications: Secondary | ICD-10-CM | POA: Diagnosis not present

## 2016-04-16 DIAGNOSIS — D631 Anemia in chronic kidney disease: Secondary | ICD-10-CM | POA: Diagnosis not present

## 2016-04-16 DIAGNOSIS — N186 End stage renal disease: Secondary | ICD-10-CM | POA: Diagnosis not present

## 2016-04-18 DIAGNOSIS — N186 End stage renal disease: Secondary | ICD-10-CM | POA: Diagnosis not present

## 2016-04-18 DIAGNOSIS — E119 Type 2 diabetes mellitus without complications: Secondary | ICD-10-CM | POA: Diagnosis not present

## 2016-04-18 DIAGNOSIS — Z992 Dependence on renal dialysis: Secondary | ICD-10-CM | POA: Diagnosis not present

## 2016-04-18 DIAGNOSIS — D631 Anemia in chronic kidney disease: Secondary | ICD-10-CM | POA: Diagnosis not present

## 2016-04-18 DIAGNOSIS — E1129 Type 2 diabetes mellitus with other diabetic kidney complication: Secondary | ICD-10-CM | POA: Diagnosis not present

## 2016-04-18 DIAGNOSIS — N2581 Secondary hyperparathyroidism of renal origin: Secondary | ICD-10-CM | POA: Diagnosis not present

## 2016-04-21 DIAGNOSIS — Z23 Encounter for immunization: Secondary | ICD-10-CM | POA: Diagnosis not present

## 2016-04-21 DIAGNOSIS — N2581 Secondary hyperparathyroidism of renal origin: Secondary | ICD-10-CM | POA: Diagnosis not present

## 2016-04-21 DIAGNOSIS — D631 Anemia in chronic kidney disease: Secondary | ICD-10-CM | POA: Diagnosis not present

## 2016-04-21 DIAGNOSIS — E119 Type 2 diabetes mellitus without complications: Secondary | ICD-10-CM | POA: Diagnosis not present

## 2016-04-21 DIAGNOSIS — N186 End stage renal disease: Secondary | ICD-10-CM | POA: Diagnosis not present

## 2016-04-23 DIAGNOSIS — N2581 Secondary hyperparathyroidism of renal origin: Secondary | ICD-10-CM | POA: Diagnosis not present

## 2016-04-23 DIAGNOSIS — Z23 Encounter for immunization: Secondary | ICD-10-CM | POA: Diagnosis not present

## 2016-04-23 DIAGNOSIS — E119 Type 2 diabetes mellitus without complications: Secondary | ICD-10-CM | POA: Diagnosis not present

## 2016-04-23 DIAGNOSIS — N186 End stage renal disease: Secondary | ICD-10-CM | POA: Diagnosis not present

## 2016-04-23 DIAGNOSIS — D631 Anemia in chronic kidney disease: Secondary | ICD-10-CM | POA: Diagnosis not present

## 2016-04-25 DIAGNOSIS — N2581 Secondary hyperparathyroidism of renal origin: Secondary | ICD-10-CM | POA: Diagnosis not present

## 2016-04-25 DIAGNOSIS — N186 End stage renal disease: Secondary | ICD-10-CM | POA: Diagnosis not present

## 2016-04-25 DIAGNOSIS — D631 Anemia in chronic kidney disease: Secondary | ICD-10-CM | POA: Diagnosis not present

## 2016-04-25 DIAGNOSIS — Z23 Encounter for immunization: Secondary | ICD-10-CM | POA: Diagnosis not present

## 2016-04-25 DIAGNOSIS — E119 Type 2 diabetes mellitus without complications: Secondary | ICD-10-CM | POA: Diagnosis not present

## 2016-04-28 DIAGNOSIS — Z23 Encounter for immunization: Secondary | ICD-10-CM | POA: Diagnosis not present

## 2016-04-28 DIAGNOSIS — E119 Type 2 diabetes mellitus without complications: Secondary | ICD-10-CM | POA: Diagnosis not present

## 2016-04-28 DIAGNOSIS — N186 End stage renal disease: Secondary | ICD-10-CM | POA: Diagnosis not present

## 2016-04-28 DIAGNOSIS — D631 Anemia in chronic kidney disease: Secondary | ICD-10-CM | POA: Diagnosis not present

## 2016-04-28 DIAGNOSIS — N2581 Secondary hyperparathyroidism of renal origin: Secondary | ICD-10-CM | POA: Diagnosis not present

## 2016-04-30 DIAGNOSIS — Z23 Encounter for immunization: Secondary | ICD-10-CM | POA: Diagnosis not present

## 2016-04-30 DIAGNOSIS — N186 End stage renal disease: Secondary | ICD-10-CM | POA: Diagnosis not present

## 2016-04-30 DIAGNOSIS — N2581 Secondary hyperparathyroidism of renal origin: Secondary | ICD-10-CM | POA: Diagnosis not present

## 2016-04-30 DIAGNOSIS — E119 Type 2 diabetes mellitus without complications: Secondary | ICD-10-CM | POA: Diagnosis not present

## 2016-04-30 DIAGNOSIS — D631 Anemia in chronic kidney disease: Secondary | ICD-10-CM | POA: Diagnosis not present

## 2016-05-02 DIAGNOSIS — Z23 Encounter for immunization: Secondary | ICD-10-CM | POA: Diagnosis not present

## 2016-05-02 DIAGNOSIS — E119 Type 2 diabetes mellitus without complications: Secondary | ICD-10-CM | POA: Diagnosis not present

## 2016-05-02 DIAGNOSIS — D631 Anemia in chronic kidney disease: Secondary | ICD-10-CM | POA: Diagnosis not present

## 2016-05-02 DIAGNOSIS — N2581 Secondary hyperparathyroidism of renal origin: Secondary | ICD-10-CM | POA: Diagnosis not present

## 2016-05-02 DIAGNOSIS — N186 End stage renal disease: Secondary | ICD-10-CM | POA: Diagnosis not present

## 2016-05-05 DIAGNOSIS — E119 Type 2 diabetes mellitus without complications: Secondary | ICD-10-CM | POA: Diagnosis not present

## 2016-05-05 DIAGNOSIS — N2581 Secondary hyperparathyroidism of renal origin: Secondary | ICD-10-CM | POA: Diagnosis not present

## 2016-05-05 DIAGNOSIS — N186 End stage renal disease: Secondary | ICD-10-CM | POA: Diagnosis not present

## 2016-05-05 DIAGNOSIS — D631 Anemia in chronic kidney disease: Secondary | ICD-10-CM | POA: Diagnosis not present

## 2016-05-05 DIAGNOSIS — Z23 Encounter for immunization: Secondary | ICD-10-CM | POA: Diagnosis not present

## 2016-05-07 DIAGNOSIS — D631 Anemia in chronic kidney disease: Secondary | ICD-10-CM | POA: Diagnosis not present

## 2016-05-07 DIAGNOSIS — N2581 Secondary hyperparathyroidism of renal origin: Secondary | ICD-10-CM | POA: Diagnosis not present

## 2016-05-07 DIAGNOSIS — Z23 Encounter for immunization: Secondary | ICD-10-CM | POA: Diagnosis not present

## 2016-05-07 DIAGNOSIS — E119 Type 2 diabetes mellitus without complications: Secondary | ICD-10-CM | POA: Diagnosis not present

## 2016-05-07 DIAGNOSIS — N186 End stage renal disease: Secondary | ICD-10-CM | POA: Diagnosis not present

## 2016-05-09 DIAGNOSIS — N2581 Secondary hyperparathyroidism of renal origin: Secondary | ICD-10-CM | POA: Diagnosis not present

## 2016-05-09 DIAGNOSIS — D631 Anemia in chronic kidney disease: Secondary | ICD-10-CM | POA: Diagnosis not present

## 2016-05-09 DIAGNOSIS — N186 End stage renal disease: Secondary | ICD-10-CM | POA: Diagnosis not present

## 2016-05-09 DIAGNOSIS — E119 Type 2 diabetes mellitus without complications: Secondary | ICD-10-CM | POA: Diagnosis not present

## 2016-05-09 DIAGNOSIS — Z23 Encounter for immunization: Secondary | ICD-10-CM | POA: Diagnosis not present

## 2016-05-12 DIAGNOSIS — Z23 Encounter for immunization: Secondary | ICD-10-CM | POA: Diagnosis not present

## 2016-05-12 DIAGNOSIS — D631 Anemia in chronic kidney disease: Secondary | ICD-10-CM | POA: Diagnosis not present

## 2016-05-12 DIAGNOSIS — E119 Type 2 diabetes mellitus without complications: Secondary | ICD-10-CM | POA: Diagnosis not present

## 2016-05-12 DIAGNOSIS — N186 End stage renal disease: Secondary | ICD-10-CM | POA: Diagnosis not present

## 2016-05-12 DIAGNOSIS — N2581 Secondary hyperparathyroidism of renal origin: Secondary | ICD-10-CM | POA: Diagnosis not present

## 2016-05-14 DIAGNOSIS — Z23 Encounter for immunization: Secondary | ICD-10-CM | POA: Diagnosis not present

## 2016-05-14 DIAGNOSIS — N2581 Secondary hyperparathyroidism of renal origin: Secondary | ICD-10-CM | POA: Diagnosis not present

## 2016-05-14 DIAGNOSIS — E119 Type 2 diabetes mellitus without complications: Secondary | ICD-10-CM | POA: Diagnosis not present

## 2016-05-14 DIAGNOSIS — D631 Anemia in chronic kidney disease: Secondary | ICD-10-CM | POA: Diagnosis not present

## 2016-05-14 DIAGNOSIS — N186 End stage renal disease: Secondary | ICD-10-CM | POA: Diagnosis not present

## 2016-05-16 DIAGNOSIS — D631 Anemia in chronic kidney disease: Secondary | ICD-10-CM | POA: Diagnosis not present

## 2016-05-16 DIAGNOSIS — E119 Type 2 diabetes mellitus without complications: Secondary | ICD-10-CM | POA: Diagnosis not present

## 2016-05-16 DIAGNOSIS — N186 End stage renal disease: Secondary | ICD-10-CM | POA: Diagnosis not present

## 2016-05-16 DIAGNOSIS — N2581 Secondary hyperparathyroidism of renal origin: Secondary | ICD-10-CM | POA: Diagnosis not present

## 2016-05-16 DIAGNOSIS — Z23 Encounter for immunization: Secondary | ICD-10-CM | POA: Diagnosis not present

## 2016-05-19 DIAGNOSIS — Z23 Encounter for immunization: Secondary | ICD-10-CM | POA: Diagnosis not present

## 2016-05-19 DIAGNOSIS — Z992 Dependence on renal dialysis: Secondary | ICD-10-CM | POA: Diagnosis not present

## 2016-05-19 DIAGNOSIS — E1129 Type 2 diabetes mellitus with other diabetic kidney complication: Secondary | ICD-10-CM | POA: Diagnosis not present

## 2016-05-19 DIAGNOSIS — N186 End stage renal disease: Secondary | ICD-10-CM | POA: Diagnosis not present

## 2016-05-19 DIAGNOSIS — N2581 Secondary hyperparathyroidism of renal origin: Secondary | ICD-10-CM | POA: Diagnosis not present

## 2016-05-19 DIAGNOSIS — D631 Anemia in chronic kidney disease: Secondary | ICD-10-CM | POA: Diagnosis not present

## 2016-05-19 DIAGNOSIS — E119 Type 2 diabetes mellitus without complications: Secondary | ICD-10-CM | POA: Diagnosis not present

## 2016-05-21 DIAGNOSIS — N186 End stage renal disease: Secondary | ICD-10-CM | POA: Diagnosis not present

## 2016-05-21 DIAGNOSIS — N2581 Secondary hyperparathyroidism of renal origin: Secondary | ICD-10-CM | POA: Diagnosis not present

## 2016-05-21 DIAGNOSIS — D631 Anemia in chronic kidney disease: Secondary | ICD-10-CM | POA: Diagnosis not present

## 2016-05-21 DIAGNOSIS — E119 Type 2 diabetes mellitus without complications: Secondary | ICD-10-CM | POA: Diagnosis not present

## 2016-05-23 DIAGNOSIS — E119 Type 2 diabetes mellitus without complications: Secondary | ICD-10-CM | POA: Diagnosis not present

## 2016-05-23 DIAGNOSIS — N186 End stage renal disease: Secondary | ICD-10-CM | POA: Diagnosis not present

## 2016-05-23 DIAGNOSIS — D631 Anemia in chronic kidney disease: Secondary | ICD-10-CM | POA: Diagnosis not present

## 2016-05-23 DIAGNOSIS — N2581 Secondary hyperparathyroidism of renal origin: Secondary | ICD-10-CM | POA: Diagnosis not present

## 2016-05-26 DIAGNOSIS — D631 Anemia in chronic kidney disease: Secondary | ICD-10-CM | POA: Diagnosis not present

## 2016-05-26 DIAGNOSIS — N2581 Secondary hyperparathyroidism of renal origin: Secondary | ICD-10-CM | POA: Diagnosis not present

## 2016-05-26 DIAGNOSIS — N186 End stage renal disease: Secondary | ICD-10-CM | POA: Diagnosis not present

## 2016-05-26 DIAGNOSIS — E119 Type 2 diabetes mellitus without complications: Secondary | ICD-10-CM | POA: Diagnosis not present

## 2016-05-28 DIAGNOSIS — D631 Anemia in chronic kidney disease: Secondary | ICD-10-CM | POA: Diagnosis not present

## 2016-05-28 DIAGNOSIS — N2581 Secondary hyperparathyroidism of renal origin: Secondary | ICD-10-CM | POA: Diagnosis not present

## 2016-05-28 DIAGNOSIS — N186 End stage renal disease: Secondary | ICD-10-CM | POA: Diagnosis not present

## 2016-05-28 DIAGNOSIS — E119 Type 2 diabetes mellitus without complications: Secondary | ICD-10-CM | POA: Diagnosis not present

## 2016-05-30 DIAGNOSIS — E119 Type 2 diabetes mellitus without complications: Secondary | ICD-10-CM | POA: Diagnosis not present

## 2016-05-30 DIAGNOSIS — N2581 Secondary hyperparathyroidism of renal origin: Secondary | ICD-10-CM | POA: Diagnosis not present

## 2016-05-30 DIAGNOSIS — N186 End stage renal disease: Secondary | ICD-10-CM | POA: Diagnosis not present

## 2016-05-30 DIAGNOSIS — D631 Anemia in chronic kidney disease: Secondary | ICD-10-CM | POA: Diagnosis not present

## 2016-06-02 DIAGNOSIS — E119 Type 2 diabetes mellitus without complications: Secondary | ICD-10-CM | POA: Diagnosis not present

## 2016-06-02 DIAGNOSIS — D631 Anemia in chronic kidney disease: Secondary | ICD-10-CM | POA: Diagnosis not present

## 2016-06-02 DIAGNOSIS — N186 End stage renal disease: Secondary | ICD-10-CM | POA: Diagnosis not present

## 2016-06-02 DIAGNOSIS — N2581 Secondary hyperparathyroidism of renal origin: Secondary | ICD-10-CM | POA: Diagnosis not present

## 2016-06-04 DIAGNOSIS — N2581 Secondary hyperparathyroidism of renal origin: Secondary | ICD-10-CM | POA: Diagnosis not present

## 2016-06-04 DIAGNOSIS — D631 Anemia in chronic kidney disease: Secondary | ICD-10-CM | POA: Diagnosis not present

## 2016-06-04 DIAGNOSIS — E119 Type 2 diabetes mellitus without complications: Secondary | ICD-10-CM | POA: Diagnosis not present

## 2016-06-04 DIAGNOSIS — N186 End stage renal disease: Secondary | ICD-10-CM | POA: Diagnosis not present

## 2016-06-06 DIAGNOSIS — D631 Anemia in chronic kidney disease: Secondary | ICD-10-CM | POA: Diagnosis not present

## 2016-06-06 DIAGNOSIS — N2581 Secondary hyperparathyroidism of renal origin: Secondary | ICD-10-CM | POA: Diagnosis not present

## 2016-06-06 DIAGNOSIS — N186 End stage renal disease: Secondary | ICD-10-CM | POA: Diagnosis not present

## 2016-06-06 DIAGNOSIS — E119 Type 2 diabetes mellitus without complications: Secondary | ICD-10-CM | POA: Diagnosis not present

## 2016-06-09 DIAGNOSIS — N186 End stage renal disease: Secondary | ICD-10-CM | POA: Diagnosis not present

## 2016-06-09 DIAGNOSIS — E119 Type 2 diabetes mellitus without complications: Secondary | ICD-10-CM | POA: Diagnosis not present

## 2016-06-09 DIAGNOSIS — D631 Anemia in chronic kidney disease: Secondary | ICD-10-CM | POA: Diagnosis not present

## 2016-06-09 DIAGNOSIS — N2581 Secondary hyperparathyroidism of renal origin: Secondary | ICD-10-CM | POA: Diagnosis not present

## 2016-06-11 DIAGNOSIS — N186 End stage renal disease: Secondary | ICD-10-CM | POA: Diagnosis not present

## 2016-06-11 DIAGNOSIS — E119 Type 2 diabetes mellitus without complications: Secondary | ICD-10-CM | POA: Diagnosis not present

## 2016-06-11 DIAGNOSIS — D631 Anemia in chronic kidney disease: Secondary | ICD-10-CM | POA: Diagnosis not present

## 2016-06-11 DIAGNOSIS — N2581 Secondary hyperparathyroidism of renal origin: Secondary | ICD-10-CM | POA: Diagnosis not present

## 2016-06-13 DIAGNOSIS — E119 Type 2 diabetes mellitus without complications: Secondary | ICD-10-CM | POA: Diagnosis not present

## 2016-06-13 DIAGNOSIS — N186 End stage renal disease: Secondary | ICD-10-CM | POA: Diagnosis not present

## 2016-06-13 DIAGNOSIS — D631 Anemia in chronic kidney disease: Secondary | ICD-10-CM | POA: Diagnosis not present

## 2016-06-13 DIAGNOSIS — N2581 Secondary hyperparathyroidism of renal origin: Secondary | ICD-10-CM | POA: Diagnosis not present

## 2016-06-16 DIAGNOSIS — D631 Anemia in chronic kidney disease: Secondary | ICD-10-CM | POA: Diagnosis not present

## 2016-06-16 DIAGNOSIS — N2581 Secondary hyperparathyroidism of renal origin: Secondary | ICD-10-CM | POA: Diagnosis not present

## 2016-06-16 DIAGNOSIS — E119 Type 2 diabetes mellitus without complications: Secondary | ICD-10-CM | POA: Diagnosis not present

## 2016-06-16 DIAGNOSIS — N186 End stage renal disease: Secondary | ICD-10-CM | POA: Diagnosis not present

## 2016-06-18 DIAGNOSIS — E119 Type 2 diabetes mellitus without complications: Secondary | ICD-10-CM | POA: Diagnosis not present

## 2016-06-18 DIAGNOSIS — N2581 Secondary hyperparathyroidism of renal origin: Secondary | ICD-10-CM | POA: Diagnosis not present

## 2016-06-18 DIAGNOSIS — Z992 Dependence on renal dialysis: Secondary | ICD-10-CM | POA: Diagnosis not present

## 2016-06-18 DIAGNOSIS — E1129 Type 2 diabetes mellitus with other diabetic kidney complication: Secondary | ICD-10-CM | POA: Diagnosis not present

## 2016-06-18 DIAGNOSIS — D631 Anemia in chronic kidney disease: Secondary | ICD-10-CM | POA: Diagnosis not present

## 2016-06-18 DIAGNOSIS — N186 End stage renal disease: Secondary | ICD-10-CM | POA: Diagnosis not present

## 2016-06-20 DIAGNOSIS — N2581 Secondary hyperparathyroidism of renal origin: Secondary | ICD-10-CM | POA: Diagnosis not present

## 2016-06-20 DIAGNOSIS — D631 Anemia in chronic kidney disease: Secondary | ICD-10-CM | POA: Diagnosis not present

## 2016-06-20 DIAGNOSIS — E119 Type 2 diabetes mellitus without complications: Secondary | ICD-10-CM | POA: Diagnosis not present

## 2016-06-20 DIAGNOSIS — N186 End stage renal disease: Secondary | ICD-10-CM | POA: Diagnosis not present

## 2016-06-23 DIAGNOSIS — N2581 Secondary hyperparathyroidism of renal origin: Secondary | ICD-10-CM | POA: Diagnosis not present

## 2016-06-23 DIAGNOSIS — N186 End stage renal disease: Secondary | ICD-10-CM | POA: Diagnosis not present

## 2016-06-23 DIAGNOSIS — E119 Type 2 diabetes mellitus without complications: Secondary | ICD-10-CM | POA: Diagnosis not present

## 2016-06-23 DIAGNOSIS — D631 Anemia in chronic kidney disease: Secondary | ICD-10-CM | POA: Diagnosis not present

## 2016-06-25 DIAGNOSIS — N186 End stage renal disease: Secondary | ICD-10-CM | POA: Diagnosis not present

## 2016-06-25 DIAGNOSIS — E119 Type 2 diabetes mellitus without complications: Secondary | ICD-10-CM | POA: Diagnosis not present

## 2016-06-25 DIAGNOSIS — D631 Anemia in chronic kidney disease: Secondary | ICD-10-CM | POA: Diagnosis not present

## 2016-06-25 DIAGNOSIS — N2581 Secondary hyperparathyroidism of renal origin: Secondary | ICD-10-CM | POA: Diagnosis not present

## 2016-06-27 DIAGNOSIS — N2581 Secondary hyperparathyroidism of renal origin: Secondary | ICD-10-CM | POA: Diagnosis not present

## 2016-06-27 DIAGNOSIS — D631 Anemia in chronic kidney disease: Secondary | ICD-10-CM | POA: Diagnosis not present

## 2016-06-27 DIAGNOSIS — N186 End stage renal disease: Secondary | ICD-10-CM | POA: Diagnosis not present

## 2016-06-27 DIAGNOSIS — E119 Type 2 diabetes mellitus without complications: Secondary | ICD-10-CM | POA: Diagnosis not present

## 2016-06-30 DIAGNOSIS — D631 Anemia in chronic kidney disease: Secondary | ICD-10-CM | POA: Diagnosis not present

## 2016-06-30 DIAGNOSIS — N2581 Secondary hyperparathyroidism of renal origin: Secondary | ICD-10-CM | POA: Diagnosis not present

## 2016-06-30 DIAGNOSIS — N186 End stage renal disease: Secondary | ICD-10-CM | POA: Diagnosis not present

## 2016-06-30 DIAGNOSIS — E119 Type 2 diabetes mellitus without complications: Secondary | ICD-10-CM | POA: Diagnosis not present

## 2016-07-02 DIAGNOSIS — E119 Type 2 diabetes mellitus without complications: Secondary | ICD-10-CM | POA: Diagnosis not present

## 2016-07-02 DIAGNOSIS — N2581 Secondary hyperparathyroidism of renal origin: Secondary | ICD-10-CM | POA: Diagnosis not present

## 2016-07-02 DIAGNOSIS — N186 End stage renal disease: Secondary | ICD-10-CM | POA: Diagnosis not present

## 2016-07-02 DIAGNOSIS — D631 Anemia in chronic kidney disease: Secondary | ICD-10-CM | POA: Diagnosis not present

## 2016-07-04 DIAGNOSIS — N186 End stage renal disease: Secondary | ICD-10-CM | POA: Diagnosis not present

## 2016-07-04 DIAGNOSIS — D631 Anemia in chronic kidney disease: Secondary | ICD-10-CM | POA: Diagnosis not present

## 2016-07-04 DIAGNOSIS — N2581 Secondary hyperparathyroidism of renal origin: Secondary | ICD-10-CM | POA: Diagnosis not present

## 2016-07-04 DIAGNOSIS — E119 Type 2 diabetes mellitus without complications: Secondary | ICD-10-CM | POA: Diagnosis not present

## 2016-07-07 DIAGNOSIS — D631 Anemia in chronic kidney disease: Secondary | ICD-10-CM | POA: Diagnosis not present

## 2016-07-07 DIAGNOSIS — N186 End stage renal disease: Secondary | ICD-10-CM | POA: Diagnosis not present

## 2016-07-07 DIAGNOSIS — N2581 Secondary hyperparathyroidism of renal origin: Secondary | ICD-10-CM | POA: Diagnosis not present

## 2016-07-07 DIAGNOSIS — E119 Type 2 diabetes mellitus without complications: Secondary | ICD-10-CM | POA: Diagnosis not present

## 2016-07-09 DIAGNOSIS — N186 End stage renal disease: Secondary | ICD-10-CM | POA: Diagnosis not present

## 2016-07-09 DIAGNOSIS — D631 Anemia in chronic kidney disease: Secondary | ICD-10-CM | POA: Diagnosis not present

## 2016-07-09 DIAGNOSIS — N2581 Secondary hyperparathyroidism of renal origin: Secondary | ICD-10-CM | POA: Diagnosis not present

## 2016-07-09 DIAGNOSIS — E119 Type 2 diabetes mellitus without complications: Secondary | ICD-10-CM | POA: Diagnosis not present

## 2016-07-11 DIAGNOSIS — N2581 Secondary hyperparathyroidism of renal origin: Secondary | ICD-10-CM | POA: Diagnosis not present

## 2016-07-11 DIAGNOSIS — E119 Type 2 diabetes mellitus without complications: Secondary | ICD-10-CM | POA: Diagnosis not present

## 2016-07-11 DIAGNOSIS — N186 End stage renal disease: Secondary | ICD-10-CM | POA: Diagnosis not present

## 2016-07-11 DIAGNOSIS — D631 Anemia in chronic kidney disease: Secondary | ICD-10-CM | POA: Diagnosis not present

## 2016-07-14 DIAGNOSIS — D631 Anemia in chronic kidney disease: Secondary | ICD-10-CM | POA: Diagnosis not present

## 2016-07-14 DIAGNOSIS — N186 End stage renal disease: Secondary | ICD-10-CM | POA: Diagnosis not present

## 2016-07-14 DIAGNOSIS — N2581 Secondary hyperparathyroidism of renal origin: Secondary | ICD-10-CM | POA: Diagnosis not present

## 2016-07-14 DIAGNOSIS — E119 Type 2 diabetes mellitus without complications: Secondary | ICD-10-CM | POA: Diagnosis not present

## 2016-07-16 DIAGNOSIS — N186 End stage renal disease: Secondary | ICD-10-CM | POA: Diagnosis not present

## 2016-07-16 DIAGNOSIS — N2581 Secondary hyperparathyroidism of renal origin: Secondary | ICD-10-CM | POA: Diagnosis not present

## 2016-07-16 DIAGNOSIS — D631 Anemia in chronic kidney disease: Secondary | ICD-10-CM | POA: Diagnosis not present

## 2016-07-16 DIAGNOSIS — E119 Type 2 diabetes mellitus without complications: Secondary | ICD-10-CM | POA: Diagnosis not present

## 2016-07-18 DIAGNOSIS — E119 Type 2 diabetes mellitus without complications: Secondary | ICD-10-CM | POA: Diagnosis not present

## 2016-07-18 DIAGNOSIS — D631 Anemia in chronic kidney disease: Secondary | ICD-10-CM | POA: Diagnosis not present

## 2016-07-18 DIAGNOSIS — N186 End stage renal disease: Secondary | ICD-10-CM | POA: Diagnosis not present

## 2016-07-18 DIAGNOSIS — N2581 Secondary hyperparathyroidism of renal origin: Secondary | ICD-10-CM | POA: Diagnosis not present

## 2016-07-19 DIAGNOSIS — Z992 Dependence on renal dialysis: Secondary | ICD-10-CM | POA: Diagnosis not present

## 2016-07-19 DIAGNOSIS — N186 End stage renal disease: Secondary | ICD-10-CM | POA: Diagnosis not present

## 2016-07-19 DIAGNOSIS — E1129 Type 2 diabetes mellitus with other diabetic kidney complication: Secondary | ICD-10-CM | POA: Diagnosis not present

## 2016-07-21 DIAGNOSIS — D631 Anemia in chronic kidney disease: Secondary | ICD-10-CM | POA: Diagnosis not present

## 2016-07-21 DIAGNOSIS — N186 End stage renal disease: Secondary | ICD-10-CM | POA: Diagnosis not present

## 2016-07-21 DIAGNOSIS — N2581 Secondary hyperparathyroidism of renal origin: Secondary | ICD-10-CM | POA: Diagnosis not present

## 2016-07-21 DIAGNOSIS — E119 Type 2 diabetes mellitus without complications: Secondary | ICD-10-CM | POA: Diagnosis not present

## 2016-07-23 DIAGNOSIS — N186 End stage renal disease: Secondary | ICD-10-CM | POA: Diagnosis not present

## 2016-07-23 DIAGNOSIS — E119 Type 2 diabetes mellitus without complications: Secondary | ICD-10-CM | POA: Diagnosis not present

## 2016-07-23 DIAGNOSIS — D631 Anemia in chronic kidney disease: Secondary | ICD-10-CM | POA: Diagnosis not present

## 2016-07-23 DIAGNOSIS — N2581 Secondary hyperparathyroidism of renal origin: Secondary | ICD-10-CM | POA: Diagnosis not present

## 2016-07-25 DIAGNOSIS — N186 End stage renal disease: Secondary | ICD-10-CM | POA: Diagnosis not present

## 2016-07-25 DIAGNOSIS — N2581 Secondary hyperparathyroidism of renal origin: Secondary | ICD-10-CM | POA: Diagnosis not present

## 2016-07-25 DIAGNOSIS — E119 Type 2 diabetes mellitus without complications: Secondary | ICD-10-CM | POA: Diagnosis not present

## 2016-07-25 DIAGNOSIS — D631 Anemia in chronic kidney disease: Secondary | ICD-10-CM | POA: Diagnosis not present

## 2016-07-28 DIAGNOSIS — E119 Type 2 diabetes mellitus without complications: Secondary | ICD-10-CM | POA: Diagnosis not present

## 2016-07-28 DIAGNOSIS — D631 Anemia in chronic kidney disease: Secondary | ICD-10-CM | POA: Diagnosis not present

## 2016-07-28 DIAGNOSIS — N186 End stage renal disease: Secondary | ICD-10-CM | POA: Diagnosis not present

## 2016-07-28 DIAGNOSIS — N2581 Secondary hyperparathyroidism of renal origin: Secondary | ICD-10-CM | POA: Diagnosis not present

## 2016-07-30 DIAGNOSIS — N2581 Secondary hyperparathyroidism of renal origin: Secondary | ICD-10-CM | POA: Diagnosis not present

## 2016-07-30 DIAGNOSIS — D631 Anemia in chronic kidney disease: Secondary | ICD-10-CM | POA: Diagnosis not present

## 2016-07-30 DIAGNOSIS — N186 End stage renal disease: Secondary | ICD-10-CM | POA: Diagnosis not present

## 2016-07-30 DIAGNOSIS — E119 Type 2 diabetes mellitus without complications: Secondary | ICD-10-CM | POA: Diagnosis not present

## 2016-08-01 DIAGNOSIS — N186 End stage renal disease: Secondary | ICD-10-CM | POA: Diagnosis not present

## 2016-08-01 DIAGNOSIS — N2581 Secondary hyperparathyroidism of renal origin: Secondary | ICD-10-CM | POA: Diagnosis not present

## 2016-08-01 DIAGNOSIS — D631 Anemia in chronic kidney disease: Secondary | ICD-10-CM | POA: Diagnosis not present

## 2016-08-01 DIAGNOSIS — E119 Type 2 diabetes mellitus without complications: Secondary | ICD-10-CM | POA: Diagnosis not present

## 2016-08-04 DIAGNOSIS — E119 Type 2 diabetes mellitus without complications: Secondary | ICD-10-CM | POA: Diagnosis not present

## 2016-08-04 DIAGNOSIS — N186 End stage renal disease: Secondary | ICD-10-CM | POA: Diagnosis not present

## 2016-08-04 DIAGNOSIS — N2581 Secondary hyperparathyroidism of renal origin: Secondary | ICD-10-CM | POA: Diagnosis not present

## 2016-08-04 DIAGNOSIS — D631 Anemia in chronic kidney disease: Secondary | ICD-10-CM | POA: Diagnosis not present

## 2016-08-06 DIAGNOSIS — D631 Anemia in chronic kidney disease: Secondary | ICD-10-CM | POA: Diagnosis not present

## 2016-08-06 DIAGNOSIS — N186 End stage renal disease: Secondary | ICD-10-CM | POA: Diagnosis not present

## 2016-08-06 DIAGNOSIS — N2581 Secondary hyperparathyroidism of renal origin: Secondary | ICD-10-CM | POA: Diagnosis not present

## 2016-08-06 DIAGNOSIS — E119 Type 2 diabetes mellitus without complications: Secondary | ICD-10-CM | POA: Diagnosis not present

## 2016-08-07 ENCOUNTER — Inpatient Hospital Stay (HOSPITAL_COMMUNITY)
Admission: AD | Admit: 2016-08-07 | Discharge: 2016-08-11 | DRG: 312 | Disposition: A | Discharge status: Home / Self Care | Payer: Medicare Other | Source: Other Acute Inpatient Hospital | Attending: Family Medicine | Admitting: Family Medicine

## 2016-08-07 ENCOUNTER — Inpatient Hospital Stay (HOSPITAL_COMMUNITY): Payer: Medicare Other

## 2016-08-07 ENCOUNTER — Encounter (HOSPITAL_COMMUNITY): Payer: Self-pay | Admitting: Internal Medicine

## 2016-08-07 DIAGNOSIS — Z880 Allergy status to penicillin: Secondary | ICD-10-CM | POA: Diagnosis not present

## 2016-08-07 DIAGNOSIS — G9340 Encephalopathy, unspecified: Secondary | ICD-10-CM

## 2016-08-07 DIAGNOSIS — E1122 Type 2 diabetes mellitus with diabetic chronic kidney disease: Secondary | ICD-10-CM | POA: Diagnosis present

## 2016-08-07 DIAGNOSIS — Z85828 Personal history of other malignant neoplasm of skin: Secondary | ICD-10-CM

## 2016-08-07 DIAGNOSIS — Z833 Family history of diabetes mellitus: Secondary | ICD-10-CM

## 2016-08-07 DIAGNOSIS — I1311 Hypertensive heart and chronic kidney disease without heart failure, with stage 5 chronic kidney disease, or end stage renal disease: Secondary | ICD-10-CM | POA: Diagnosis not present

## 2016-08-07 DIAGNOSIS — S0990XA Unspecified injury of head, initial encounter: Secondary | ICD-10-CM | POA: Diagnosis not present

## 2016-08-07 DIAGNOSIS — Z992 Dependence on renal dialysis: Secondary | ICD-10-CM

## 2016-08-07 DIAGNOSIS — M6282 Rhabdomyolysis: Secondary | ICD-10-CM | POA: Diagnosis present

## 2016-08-07 DIAGNOSIS — R531 Weakness: Secondary | ICD-10-CM | POA: Diagnosis not present

## 2016-08-07 DIAGNOSIS — R55 Syncope and collapse: Secondary | ICD-10-CM | POA: Diagnosis not present

## 2016-08-07 DIAGNOSIS — S199XXA Unspecified injury of neck, initial encounter: Secondary | ICD-10-CM | POA: Diagnosis not present

## 2016-08-07 DIAGNOSIS — N189 Chronic kidney disease, unspecified: Secondary | ICD-10-CM

## 2016-08-07 DIAGNOSIS — K219 Gastro-esophageal reflux disease without esophagitis: Secondary | ICD-10-CM | POA: Diagnosis present

## 2016-08-07 DIAGNOSIS — D631 Anemia in chronic kidney disease: Secondary | ICD-10-CM | POA: Diagnosis present

## 2016-08-07 DIAGNOSIS — R270 Ataxia, unspecified: Secondary | ICD-10-CM | POA: Diagnosis not present

## 2016-08-07 DIAGNOSIS — M47812 Spondylosis without myelopathy or radiculopathy, cervical region: Secondary | ICD-10-CM | POA: Diagnosis not present

## 2016-08-07 DIAGNOSIS — E8889 Other specified metabolic disorders: Secondary | ICD-10-CM | POA: Diagnosis present

## 2016-08-07 DIAGNOSIS — N186 End stage renal disease: Secondary | ICD-10-CM | POA: Diagnosis present

## 2016-08-07 DIAGNOSIS — Z87891 Personal history of nicotine dependence: Secondary | ICD-10-CM | POA: Diagnosis not present

## 2016-08-07 DIAGNOSIS — I10 Essential (primary) hypertension: Secondary | ICD-10-CM | POA: Diagnosis present

## 2016-08-07 DIAGNOSIS — N2581 Secondary hyperparathyroidism of renal origin: Secondary | ICD-10-CM | POA: Diagnosis not present

## 2016-08-07 DIAGNOSIS — S098XXA Other specified injuries of head, initial encounter: Secondary | ICD-10-CM | POA: Diagnosis not present

## 2016-08-07 DIAGNOSIS — Z888 Allergy status to other drugs, medicaments and biological substances status: Secondary | ICD-10-CM

## 2016-08-07 DIAGNOSIS — Z8249 Family history of ischemic heart disease and other diseases of the circulatory system: Secondary | ICD-10-CM | POA: Diagnosis not present

## 2016-08-07 DIAGNOSIS — Z9049 Acquired absence of other specified parts of digestive tract: Secondary | ICD-10-CM | POA: Diagnosis not present

## 2016-08-07 DIAGNOSIS — J9811 Atelectasis: Secondary | ICD-10-CM | POA: Diagnosis not present

## 2016-08-07 DIAGNOSIS — Z9889 Other specified postprocedural states: Secondary | ICD-10-CM | POA: Diagnosis not present

## 2016-08-07 DIAGNOSIS — R079 Chest pain, unspecified: Secondary | ICD-10-CM | POA: Diagnosis not present

## 2016-08-07 DIAGNOSIS — I120 Hypertensive chronic kidney disease with stage 5 chronic kidney disease or end stage renal disease: Secondary | ICD-10-CM | POA: Diagnosis not present

## 2016-08-07 DIAGNOSIS — I129 Hypertensive chronic kidney disease with stage 1 through stage 4 chronic kidney disease, or unspecified chronic kidney disease: Secondary | ICD-10-CM | POA: Diagnosis not present

## 2016-08-07 DIAGNOSIS — S064X0A Epidural hemorrhage without loss of consciousness, initial encounter: Secondary | ICD-10-CM | POA: Diagnosis not present

## 2016-08-07 LAB — AMMONIA: Ammonia: 25 umol/L (ref 9–35)

## 2016-08-07 LAB — CBC WITH DIFFERENTIAL/PLATELET
BASOS ABS: 0 10*3/uL (ref 0.0–0.1)
Basophils Relative: 0 %
Eosinophils Absolute: 0 10*3/uL (ref 0.0–0.7)
Eosinophils Relative: 0 %
HEMATOCRIT: 34.3 % — AB (ref 39.0–52.0)
HEMOGLOBIN: 11.6 g/dL — AB (ref 13.0–17.0)
LYMPHS PCT: 18 %
Lymphs Abs: 1.1 10*3/uL (ref 0.7–4.0)
MCH: 29.8 pg (ref 26.0–34.0)
MCHC: 33.8 g/dL (ref 30.0–36.0)
MCV: 88.2 fL (ref 78.0–100.0)
MONOS PCT: 11 %
Monocytes Absolute: 0.7 10*3/uL (ref 0.1–1.0)
NEUTROS PCT: 71 %
Neutro Abs: 4.3 10*3/uL (ref 1.7–7.7)
Platelets: 152 10*3/uL (ref 150–400)
RBC: 3.89 MIL/uL — ABNORMAL LOW (ref 4.22–5.81)
RDW: 15.1 % (ref 11.5–15.5)
WBC: 6.1 10*3/uL (ref 4.0–10.5)

## 2016-08-07 LAB — TSH: TSH: 0.381 u[IU]/mL (ref 0.350–4.500)

## 2016-08-07 LAB — COMPREHENSIVE METABOLIC PANEL
ALBUMIN: 2.8 g/dL — AB (ref 3.5–5.0)
ALT: 26 U/L (ref 17–63)
ANION GAP: 17 — AB (ref 5–15)
AST: 77 U/L — ABNORMAL HIGH (ref 15–41)
Alkaline Phosphatase: 52 U/L (ref 38–126)
BUN: 34 mg/dL — ABNORMAL HIGH (ref 6–20)
CO2: 24 mmol/L (ref 22–32)
Calcium: 7.9 mg/dL — ABNORMAL LOW (ref 8.9–10.3)
Chloride: 97 mmol/L — ABNORMAL LOW (ref 101–111)
Creatinine, Ser: 6.86 mg/dL — ABNORMAL HIGH (ref 0.61–1.24)
GFR calc Af Amer: 8 mL/min — ABNORMAL LOW (ref >60)
GFR calc non Af Amer: 7 mL/min — ABNORMAL LOW (ref >60)
GLUCOSE: 106 mg/dL — AB (ref 65–99)
POTASSIUM: 3 mmol/L — AB (ref 3.5–5.1)
SODIUM: 138 mmol/L (ref 135–145)
Total Bilirubin: 0.7 mg/dL (ref 0.3–1.2)
Total Protein: 6.2 g/dL — ABNORMAL LOW (ref 6.5–8.1)

## 2016-08-07 LAB — CK: CK TOTAL: 2938 U/L — AB (ref 49–397)

## 2016-08-07 MED ORDER — ISOSORBIDE MONONITRATE ER 60 MG PO TB24
60.0000 mg | ORAL_TABLET | Freq: Every day | ORAL | Status: DC
  Administered 2016-08-07 – 2016-08-11 (×5): 60 mg via ORAL
  Filled 2016-08-07 (×5): qty 1

## 2016-08-07 MED ORDER — ACETAMINOPHEN 650 MG RE SUPP: 650.0000 mg | Freq: Four times a day (QID) | RECTAL | Status: DC | PRN

## 2016-08-07 MED ORDER — ALBUTEROL SULFATE HFA 108 (90 BASE) MCG/ACT IN AERS: 2.0000 | INHALATION_SPRAY | Freq: Four times a day (QID) | RESPIRATORY_TRACT | Status: DC | PRN

## 2016-08-07 MED ORDER — CARVEDILOL 12.5 MG PO TABS
12.5000 mg | ORAL_TABLET | Freq: Two times a day (BID) | ORAL | Status: DC
  Administered 2016-08-07 – 2016-08-11 (×8): 12.5 mg via ORAL
  Filled 2016-08-07 (×8): qty 1

## 2016-08-07 MED ORDER — ASPIRIN 81 MG PO CHEW
81.0000 mg | CHEWABLE_TABLET | Freq: Every day | ORAL | Status: DC
  Administered 2016-08-08 – 2016-08-11 (×4): 81 mg via ORAL
  Filled 2016-08-07 (×4): qty 1

## 2016-08-07 MED ORDER — ONDANSETRON HCL 4 MG/2ML IJ SOLN: 4.0000 mg | Freq: Four times a day (QID) | INTRAMUSCULAR | Status: DC | PRN

## 2016-08-07 MED ORDER — AMLODIPINE BESYLATE 10 MG PO TABS
10.0000 mg | ORAL_TABLET | Freq: Every day | ORAL | Status: DC
  Administered 2016-08-08 – 2016-08-11 (×4): 10 mg via ORAL
  Filled 2016-08-07 (×4): qty 1

## 2016-08-07 MED ORDER — ONDANSETRON HCL 4 MG PO TABS
4.0000 mg | ORAL_TABLET | Freq: Four times a day (QID) | ORAL | Status: DC | PRN
Start: 2016-08-07 — End: 2016-08-11

## 2016-08-07 MED ORDER — HYDRALAZINE HCL 50 MG PO TABS
75.0000 mg | ORAL_TABLET | Freq: Three times a day (TID) | ORAL | Status: DC
  Administered 2016-08-07 – 2016-08-11 (×11): 75 mg via ORAL
  Filled 2016-08-07 (×11): qty 1

## 2016-08-07 MED ORDER — SEVELAMER CARBONATE 800 MG PO TABS
2400.0000 mg | ORAL_TABLET | Freq: Three times a day (TID) | ORAL | Status: DC
  Administered 2016-08-08 – 2016-08-11 (×10): 2400 mg via ORAL
  Filled 2016-08-07 (×11): qty 3

## 2016-08-07 MED ORDER — ACETAMINOPHEN 325 MG PO TABS
650.0000 mg | ORAL_TABLET | Freq: Four times a day (QID) | ORAL | Status: DC | PRN
  Administered 2016-08-07: 650 mg via ORAL
  Filled 2016-08-07: qty 2

## 2016-08-07 MED ORDER — RENA-VITE PO TABS
1.0000 | ORAL_TABLET | Freq: Every day | ORAL | Status: DC
  Administered 2016-08-07 – 2016-08-10 (×4): 1 via ORAL
  Filled 2016-08-07 (×4): qty 1

## 2016-08-07 NOTE — Progress Notes (Signed)
Pt has no orders at this time.

## 2016-08-07 NOTE — H&P (Addendum)
History and Physical    Lucas Calderon VQX:[id number] DOB: Sep 12, 1940 DOA: 08/07/2016  PCP: Maryella Shivers, MD  Patient coming from: Was transferred from [facility].  Chief Complaint: All and weakness.  HPI: Lucas Calderon is a 76 y.o. male with ESRD on hemodialysis on Tuesdays Thursdays and Saturday was brought to the ER at [facility] after patient had a fall 2 days ago. Since the fall patient is feeling weak to walk. Patient states he lost balance and fell. Denies hitting his head or losing consciousness. On exam patient is nonfocal. Labs were largely showing chronic changes except for CK levels in the 2000. Patient was given fluid bolus in the ER and transferred to [facility] for further workup since patient will need dialysis. On empiric exam patient is able to move all extremities without difficulty. Denies any chest pain or shortness of breath. Patient still complains of pain in the left neck area.  I have reviewed the chart from [facility] CT head and C-spine were unremarkable. 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 EKG shows normal sinus rhythm. Chest x-ray does not show any acute infiltrates. Labs show elevated CK levels.  ED Course: Patient was a direct admit from [facility].  Review of Systems: As per HPI, rest all negative.   Past Medical History:  Diagnosis Date  . Anemia   . Cancer ([place])    skin cancer - face  . Chronic kidney disease    dialysis T-TH-Sa  . Diabetes mellitus without complication ([place])    not taking any meds  . GERD (gastroesophageal reflux disease)   . Hypertension   . Pneumonia   . Shortness of breath     Past Surgical History:  Procedure Laterality Date  . APPENDECTOMY    . AV FISTULA PLACEMENT Left 09/11/2013   Procedure:  CREATION- RADIOCEPHALIC arteriovenous fistula;  Surgeon: Conrad Freeport, MD;  Location: [place];  Service: Vascular;  Laterality: Left;  . FISTULOGRAM Left 01/31/2014   Procedure: FISTULOGRAM;   Surgeon: Conrad Clarkston, MD;  Location: [place];  Service: Vascular;  Laterality: Left;  . LIGATION OF COMPETING BRANCHES OF ARTERIOVENOUS FISTULA Left 01/31/2014   Procedure: REVISION OF ARTERIOVENOUS FISTULA;  Surgeon: Conrad , MD;  Location: [place];  Service: Vascular;  Laterality: Left;     reports that he quit smoking about 22 years ago. He has never used smokeless tobacco. He reports that he does not drink alcohol or use drugs.  Allergies  Allergen Reactions  . Penicillins     Unknown    Family History  Problem Relation Age of Onset  . Diabetes Mother   . Heart disease Mother   . Hypertension Mother   . Diabetes Father   . Hypertension Father   . Peripheral vascular disease Father   . Hypertension Sister     Prior to Admission medications   Medication Sig Start Date End Date Taking? Authorizing Provider  albuterol (PROVENTIL HFA) 108 (90 BASE) MCG/ACT inhaler Inhale 2 puffs into the lungs every 6 (six) hours as needed for wheezing or shortness of breath.   Yes Historical Provider, MD  amLODipine (NORVASC) 10 MG tablet Take 10 mg by mouth daily.   Yes Historical Provider, MD  aspirin 81 MG tablet Take 81 mg by mouth daily.   Yes Historical Provider, MD  B Complex-C-Folic Acid (VITA-BEE/C) TABS Take 1 tablet by mouth daily.   Yes Historical Provider, MD  carvedilol (COREG) 12.5 MG tablet Take 12.5  mg by mouth 2 (two) times daily with a meal.   Yes Historical Provider, MD  hydrALAZINE (APRESOLINE) 50 MG tablet Take 1.5 tablets (75 mg total) by mouth 3 (three) times daily. 11/22/13  Yes Modena Jansky, MD  isosorbide mononitrate (IMDUR) 60 MG 24 hr tablet Take 60 mg by mouth daily.   Yes Historical Provider, MD  multivitamin (RENA-VIT) TABS tablet Take 1 tablet by mouth at bedtime. 11/22/13  Yes Modena Jansky, MD  oxyCODONE-acetaminophen (ROXICET) 5-325 MG per tablet Take 1 tablet by mouth every 6 (six) hours as needed for severe pain. 01/31/14  Yes Kimberly A Trinh, PA-C  RENVELA  800 MG tablet Take 2,400 mg by mouth 3 (three) times daily. 07/10/16  Yes Historical Provider, MD    Physical Exam: Vitals:   08/07/16 1800  BP: (!) 140/59  Pulse: 84  Temp: 98.6 F (37 C)  TempSrc: Oral  SpO2: 99%      Constitutional: Moderately built and nourished. Vitals:   08/07/16 1800  BP: (!) 140/59  Pulse: 84  Temp: 98.6 F (37 C)  TempSrc: Oral  SpO2: 99%   Eyes: Anicteric. No pallor. ENMT: No discharge from the ears eyes nose and mouth. Neck: No neck rigidity. No JVD appreciated. Respiratory: No rhonchi or crepitations. Cardiovascular: S1 and S2 heard. No murmurs appreciated. Abdomen: Soft nontender bowel sounds present. Musculoskeletal: No edema. No joint effusion. Skin: No rash. Skin appears warm. Neurologic: Alert awake oriented to time place and person. Moves all estimate is 5 x 5. No facial asymmetry. Tongue is midline. Psychiatric: Appears normal. Normal affect.   Labs on Admission: I have personally reviewed following labs and imaging studies  CBC: No results for input(s): WBC, NEUTROABS, HGB, HCT, MCV, PLT in the last 168 hours. Basic Metabolic Panel: No results for input(s): NA, K, CL, CO2, GLUCOSE, BUN, CREATININE, CALCIUM, MG, PHOS in the last 168 hours. GFR: CrCl cannot be calculated (Patient's most recent lab result is older than the maximum 21 days allowed.). Liver Function Tests: No results for input(s): AST, ALT, ALKPHOS, BILITOT, PROT, ALBUMIN in the last 168 hours. No results for input(s): LIPASE, AMYLASE in the last 168 hours. No results for input(s): AMMONIA in the last 168 hours. Coagulation Profile: No results for input(s): INR, PROTIME in the last 168 hours. Cardiac Enzymes: No results for input(s): CKTOTAL, CKMB, CKMBINDEX, TROPONINI in the last 168 hours. BNP (last 3 results) No results for input(s): PROBNP in the last 8760 hours. HbA1C: No results for input(s): HGBA1C in the last 72 hours. CBG: No results for input(s):  GLUCAP in the last 168 hours. Lipid Profile: No results for input(s): CHOL, HDL, LDLCALC, TRIG, CHOLHDL, LDLDIRECT in the last 72 hours. Thyroid Function Tests: No results for input(s): TSH, T4TOTAL, FREET4, T3FREE, THYROIDAB in the last 72 hours. Anemia Panel: No results for input(s): VITAMINB12, FOLATE, FERRITIN, TIBC, IRON, RETICCTPCT in the last 72 hours. Urine analysis:    Component Value Date/Time   COLORURINE YELLOW 11/17/2013 1642   APPEARANCEUR CLEAR 11/17/2013 1642   LABSPEC 1.014 11/17/2013 1642   PHURINE 5.5 11/17/2013 1642   GLUCOSEU NEGATIVE 11/17/2013 1642   HGBUR TRACE (A) 11/17/2013 1642   BILIRUBINUR NEGATIVE 11/17/2013 1642   KETONESUR NEGATIVE 11/17/2013 1642   PROTEINUR 100 (A) 11/17/2013 1642   UROBILINOGEN 0.2 11/17/2013 1642   NITRITE NEGATIVE 11/17/2013 1642   LEUKOCYTESUR NEGATIVE 11/17/2013 1642   Sepsis Labs: @LABRCNTIP (procalcitonin:4,lacticidven:4) )No results found for this or any previous visit (from the past 240  hour(s)).   Radiological Exams on Admission: Dg Chest Port 1 View  Result Date: 08/07/2016 CLINICAL DATA:  Acute encephalopathy EXAM: PORTABLE CHEST 1 VIEW COMPARISON:  08/07/2016 FINDINGS: No acute infiltrate or E fusion. Minimal atelectasis at the lung bases. The heart is slightly enlarged. No edema. No pneumothorax. Atherosclerosis of the aorta. IMPRESSION: Slightly enlarged heart size. No acute infiltrate or edema. Mild basilar atelectasis. Electronically Signed   By: Donavan Foil M.D.   On: 08/07/2016 20:24    EKG: Independently reviewed. Normal sinus rhythm.  Assessment/Plan Active Problems:   HTN (hypertension)   Anemia in CKD (chronic kidney disease)   End stage renal disease (HCC)   Near syncope   Rhabdomyolysis    1. Near syncope with fall and difficulty walking - since patient is complaining of neck pain I have ordered MRI C-spine and also will get MRI of the brain due to weakness. If patient still has weakness consult  neurology. Get physical therapy consult. 2. Rhabdomyolysis probably from fall - follow CK levels. Patient received fluid bolus in the ER at [facility]. Hesitant to give further fluid due to ESRD. 3. Thyroid mass - will need further workup as outpatient. Check TSH. 4. Hypertension - reviewed home medications and will continue home medications. 5. Chronic anemia - follow CBC. 6. ESRD on hemodialysis on Tuesday Thursday and Saturday - consult nephrology for dialysis.  All labs are pending.   DVT prophylaxis: SCDs for not until MRI results are available. Code Status: Full code.  Family Communication: Patient's brother.  Disposition Plan: Home.  Consults called: None.     Rise Patience MD [facility] Pager [phone].  If 7PM-7AM, please contact night-coverage [email or web address] Password [email or web address]  08/07/2016, 9:15 PM

## 2016-08-07 NOTE — Progress Notes (Signed)
Pt alert to self.  Paged Admissions.  MRSA swab done. Pt on contact for hx VRE.

## 2016-08-08 ENCOUNTER — Inpatient Hospital Stay (HOSPITAL_COMMUNITY): Payer: Medicare Other

## 2016-08-08 LAB — CBC
HCT: 30.4 % — ABNORMAL LOW (ref 39.0–52.0)
HCT: 31 % — ABNORMAL LOW (ref 39.0–52.0)
HEMOGLOBIN: 10.1 g/dL — AB (ref 13.0–17.0)
Hemoglobin: 10.3 g/dL — ABNORMAL LOW (ref 13.0–17.0)
MCH: 29.4 pg (ref 26.0–34.0)
MCH: 29.3 pg (ref 26.0–34.0)
MCHC: 33.2 g/dL (ref 30.0–36.0)
MCHC: 33.2 g/dL (ref 30.0–36.0)
MCV: 88.4 fL (ref 78.0–100.0)
MCV: 88.3 fL (ref 78.0–100.0)
PLATELETS: 154 10*3/uL (ref 150–400)
Platelets: 167 10*3/uL (ref 150–400)
RBC: 3.44 MIL/uL — AB (ref 4.22–5.81)
RBC: 3.51 MIL/uL — AB (ref 4.22–5.81)
RDW: 15 % (ref 11.5–15.5)
RDW: 14.9 % (ref 11.5–15.5)
WBC: 6.3 10*3/uL (ref 4.0–10.5)
WBC: 6 10*3/uL (ref 4.0–10.5)

## 2016-08-08 LAB — RENAL FUNCTION PANEL
ALBUMIN: 2.6 g/dL — AB (ref 3.5–5.0)
Anion gap: 12 (ref 5–15)
BUN: 44 mg/dL — ABNORMAL HIGH (ref 6–20)
CALCIUM: 8 mg/dL — AB (ref 8.9–10.3)
CHLORIDE: 98 mmol/L — AB (ref 101–111)
CO2: 25 mmol/L (ref 22–32)
CREATININE: 7.62 mg/dL — AB (ref 0.61–1.24)
GFR, EST AFRICAN AMERICAN: 7 mL/min — AB (ref >60)
GFR, EST NON AFRICAN AMERICAN: 6 mL/min — AB (ref >60)
Glucose, Bld: 195 mg/dL — ABNORMAL HIGH (ref 65–99)
PHOSPHORUS: 5.8 mg/dL — AB (ref 2.5–4.6)
Potassium: 3.2 mmol/L — ABNORMAL LOW (ref 3.5–5.1)
SODIUM: 135 mmol/L (ref 135–145)

## 2016-08-08 LAB — BASIC METABOLIC PANEL
Anion gap: 13 (ref 5–15)
BUN: 41 mg/dL — ABNORMAL HIGH (ref 6–20)
CHLORIDE: 102 mmol/L (ref 101–111)
CO2: 24 mmol/L (ref 22–32)
CREATININE: 7.19 mg/dL — AB (ref 0.61–1.24)
Calcium: 7.9 mg/dL — ABNORMAL LOW (ref 8.9–10.3)
GFR calc Af Amer: 8 mL/min — ABNORMAL LOW (ref >60)
GFR, EST NON AFRICAN AMERICAN: 7 mL/min — AB (ref >60)
GLUCOSE: 151 mg/dL — AB (ref 65–99)
POTASSIUM: 3.3 mmol/L — AB (ref 3.5–5.1)
SODIUM: 139 mmol/L (ref 135–145)

## 2016-08-08 LAB — MRSA PCR SCREENING: MRSA by PCR: NEGATIVE

## 2016-08-08 LAB — CK: CK TOTAL: 2359 U/L — AB (ref 49–397)

## 2016-08-08 MED ORDER — CALCITRIOL 0.25 MCG PO CAPS: 0.2500 ug | ORAL_CAPSULE | ORAL | Status: DC | PRN

## 2016-08-08 MED ORDER — ALTEPLASE 2 MG IJ SOLR: 2.0000 mg | Freq: Once | INTRAMUSCULAR | Status: DC | PRN

## 2016-08-08 MED ORDER — SODIUM CHLORIDE 0.9 % IV SOLN: 100.0000 mL | INTRAVENOUS | Status: DC | PRN

## 2016-08-08 MED ORDER — HEPARIN SODIUM (PORCINE) 1000 UNIT/ML DIALYSIS: 1000.0000 [IU] | INTRAMUSCULAR | Status: DC | PRN

## 2016-08-08 MED ORDER — LIDOCAINE HCL (PF) 1 % IJ SOLN: 5.0000 mL | INTRAMUSCULAR | Status: DC | PRN

## 2016-08-08 MED ORDER — LORAZEPAM 2 MG/ML IJ SOLN: 1.0000 mg | Freq: Once | INTRAMUSCULAR | Status: DC

## 2016-08-08 MED ORDER — HEPARIN SODIUM (PORCINE) 1000 UNIT/ML DIALYSIS
1000.0000 [IU] | INTRAMUSCULAR | Status: DC | PRN
Start: 2016-08-08 — End: 2016-08-10

## 2016-08-08 MED ORDER — PENTAFLUOROPROP-TETRAFLUOROETH EX AERO: 1.0000 "application " | INHALATION_SPRAY | CUTANEOUS | Status: DC | PRN

## 2016-08-08 MED ORDER — LIDOCAINE-PRILOCAINE 2.5-2.5 % EX CREA: 1.0000 "application " | TOPICAL_CREAM | CUTANEOUS | Status: DC | PRN

## 2016-08-08 NOTE — Progress Notes (Signed)
Aileen Fass, pt sister whom is listed as next of kin, was called and informed of pt found on floor and that he denies any injury. [name] was appreciative for the call and keeping her informed. States that a family member will be by tomorrow to check up on the patient.

## 2016-08-08 NOTE — Progress Notes (Signed)
PT Cancellation Note  Patient Details Name: Lucas Calderon MRN: [id number] DOB: 03-Jul-1941   Cancelled Treatment:    Reason Eval/Treat Not Completed: Patient at procedure or test/unavailable. Pt off of the floor for dialysis. PT will continue to f/u with pt as appropriate and available.   [place] 08/08/2016, 2:35 PM

## 2016-08-08 NOTE — Procedures (Signed)
Patient seen and examined on Hemodialysis. QB 450 UF goal 2.0L.  He is tolerating treatment well.  No complaints. Treatment adjusted as needed.  [name] MD [facility] 4:08 PM

## 2016-08-08 NOTE — Progress Notes (Addendum)
Pt found on floor next to bed by Amado Coe, RN. Alert and oriented to person and place only. No change in mentation. Denies injury. No complaints noted. Vs 98.6- 86- 18- 159/107. O2 saturation 96% via RA. Assessment complete. Dr. Olevia Bowens notified. Will continue to monitor.

## 2016-08-08 NOTE — Progress Notes (Signed)
PROGRESS NOTE    Lucas Calderon  FXJ:[id number] DOB: [birth year] DOA: 08/07/2016 PCP: Maryella Shivers, MD    Brief Narrative:  76 y.o. male with ESRD on hemodialysis on Tuesdays Thursdays and Saturday was brought to the ER at [facility] after patient had a fall 2 days ago. Since the fall patient is feeling weak to walk. Patient states he lost balance and fell.    Assessment & Plan:   Principal Problem:   Near syncope - I suspect patient most likely has generalized weakness secondary to deconditioning. Agree with having physical therapy evaluation and will continue treatment recommendations based on their evaluation recommendations.  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    End stage renal disease ([place]) - consulted Nephrology    Rhabdomyolysis - trending down. Unable to administer fluids secondary to ESRD.  DVT prophylaxis: SCD's Code Status: Full Family Communication: none at bedside Disposition Plan: pending PT evaluation and recommendations   Consultants:   Nephrology   Procedures: None   Antimicrobials: (None   Subjective: Pt has no new complaint today. When asked why he was at hospital he replied, " I am here for dialysis."  Objective: Vitals:   08/07/16 1800 08/07/16 2050 08/08/16 0602  BP: (!) 140/59 (!) 162/68 (!) 130/54  Pulse: 84 87 80  Resp:  18 18  Temp: 98.6 F (37 C) 98.6 F (37 C) 98.1 F (36.7 C)  TempSrc: Oral Oral Oral  SpO2: 99% 98% 99%  Weight:  70.1 kg (154 lb 8.7 oz)     Intake/Output Summary (Last 24 hours) at 08/08/16 1422 Last data filed at 08/08/16 0900  Gross per 24 hour  Intake              120 ml  Output              200 ml  Net              -80 ml   Filed Weights   08/07/16 2050  Weight: 70.1 kg (154 lb 8.7 oz)    Examination:  General  exam: Appears calm and comfortable, in nad. Respiratory system: Clear to auscultation. Respiratory effort normal. Cardiovascular system: S1 & S2 heard, RRR.  Gastrointestinal system: Abdomen is nondistended, soft and nontender. No organomegaly or masses felt. Normal bowel sounds heard. Central nervous system: Alert and oriented. No focal neurological deficits. Extremities: Symmetric 5 x 5 power Skin: No rashes, lesions or ulcers, on limited exam. Psychiatry:  Mood & affect appropriate.   Data Reviewed: I have personally reviewed following labs and imaging studies  CBC:  Recent Labs Lab 08/07/16 2047 08/08/16 0422 08/08/16 1152  WBC 6.1 6.3 6.0  NEUTROABS 4.3  --   --   HGB 11.6* 10.1* 10.3*  HCT 34.3* 30.4* 31.0*  MCV 88.2 88.4 88.3  PLT 152 167 641   Basic Metabolic Panel:  Recent Labs Lab 08/07/16 2047 08/08/16 0422 08/08/16 1152  NA 138 139 135  K 3.0* 3.3* 3.2*  CL 97* 102 98*  CO2 24 24 25   GLUCOSE 106* 151* 195*  BUN 34* 41* 44*  CREATININE 6.86* 7.19* 7.62*  CALCIUM 7.9* 7.9* 8.0*  PHOS  --   --  5.8*   GFR: CrCl cannot be calculated (Unknown ideal weight.). Liver Function Tests:  Recent Labs Lab 08/07/16 2047 08/08/16  1152  AST 77*  --   ALT 26  --   ALKPHOS 52  --   BILITOT 0.7  --   PROT 6.2*  --   ALBUMIN 2.8* 2.6*   No results for input(s): LIPASE, AMYLASE in the last 168 hours.  Recent Labs Lab 08/07/16 2047  AMMONIA 25   Coagulation Profile: No results for input(s): INR, PROTIME in the last 168 hours. Cardiac Enzymes:  Recent Labs Lab 08/07/16 2047 08/08/16 0422  CKTOTAL 2,938* 2,359*   BNP (last 3 results) No results for input(s): PROBNP in the last 8760 hours. HbA1C: No results for input(s): HGBA1C in the last 72 hours. CBG: No results for input(s): GLUCAP in the last 168 hours. Lipid Profile: No results for input(s): CHOL, HDL, LDLCALC, TRIG, CHOLHDL, LDLDIRECT in the last 72 hours. Thyroid Function Tests:  Recent  Labs  08/07/16 2047  TSH 0.381   Anemia Panel: No results for input(s): VITAMINB12, FOLATE, FERRITIN, TIBC, IRON, RETICCTPCT in the last 72 hours. Sepsis Labs: No results for input(s): PROCALCITON, LATICACIDVEN in the last 168 hours.  Recent Results (from the past 240 hour(s))  MRSA PCR Screening     Status: None   Collection Time: 08/07/16  6:19 PM  Result Value Ref Range Status   MRSA by PCR NEGATIVE NEGATIVE Final    Comment:        The GeneXpert MRSA Assay (FDA approved for NASAL specimens only), is one component of a comprehensive MRSA colonization surveillance program. It is not intended to diagnose MRSA infection nor to guide or monitor treatment for MRSA infections.          Radiology Studies: Mr Brain 92 Contrast  Result Date: 08/08/2016 CLINICAL DATA:  76 y/o M; bilateral lower extremity weakness and confusion. EXAM: MRI HEAD WITHOUT CONTRAST TECHNIQUE: Multiplanar, multiecho pulse sequences of the brain and surrounding structures were obtained without intravenous contrast. COMPARISON:  Concurrent MRI of the cervical spine. 08/07/2016 CT of the head and cervical spine. FINDINGS: Brain: No acute infarction, hemorrhage, hydrocephalus, extra-axial collection or mass lesion. Foci of T2 FLAIR hyperintense signal abnormality in subcortical and periventricular white matter are compatible with mild chronic microvascular ischemic changes. There is moderate brain parenchymal volume loss of both the supratentorial and infratentorial brain. Vascular: Normal flow voids. Skull and upper cervical spine: Normal marrow signal. Sinuses/Orbits: Negative. Other: None. IMPRESSION: 1. No acute intracranial abnormality is identified. 2. Mild chronic microvascular ischemic changes of the brain. 3. Moderate supratentorial and infratentorial brain parenchymal volume loss. Electronically Signed   By: Kristine Garbe M.D.   On: 08/08/2016 02:02   Mr Cervical Spine Wo Contrast  Result  Date: 08/08/2016 CLINICAL DATA:  76 y/o M; bilateral lower extremity weakness and confusion. EXAM: MRI CERVICAL SPINE WITHOUT CONTRAST TECHNIQUE: Multiplanar, multisequence MR imaging of the cervical spine was performed. No intravenous contrast was administered. COMPARISON:  Concurrent MRI of the head. 08/07/2016 CT of the head and cervical spine. FINDINGS: Alignment: Physiologic. Vertebrae: No fracture, evidence of discitis, or bone lesion. Cord: Normal signal and morphology. Posterior Fossa, vertebral arteries, paraspinal tissues: 31 mm nodule within the left lobe of the thyroid gland. Disc levels: C2-3: No significant disc displacement, foraminal narrowing, or canal stenosis. C3-4: Mild right-sided uncovertebral and facet hypertrophy with mild right foraminal narrowing. No significant left foraminal narrowing or canal stenosis. C4-5: No significant disc displacement, foraminal narrowing, or canal stenosis. C5-6: Small disc osteophyte complex and bilateral uncovertebral and facet hypertrophy. Mild bilateral foraminal narrowing. Effacement of the  anterior thecal sac with anterior cord contact and slight anterior cord flattening. No significant canal stenosis. C6-7: Disc osteophyte complex eccentric to the right and right greater than left uncovertebral and facet hypertrophy. Severe right and moderate left foraminal narrowing. Mild canal stenosis and mild anterior cord flattening. C7-T1: No significant disc displacement, foraminal narrowing, or canal stenosis. IMPRESSION: 1. No acute osseous abnormality or abnormal cord signal. 2. Cervical degenerative changes are greatest at the C6-7 level where there is severe right/ moderate left foraminal narrowing and mild canal stenosis. Electronically Signed   By: Kristine Garbe M.D.   On: 08/08/2016 01:59   Dg Chest Port 1 View  Result Date: 08/07/2016 CLINICAL DATA:  Acute encephalopathy EXAM: PORTABLE CHEST 1 VIEW COMPARISON:  08/07/2016 FINDINGS: No acute  infiltrate or E fusion. Minimal atelectasis at the lung bases. The heart is slightly enlarged. No edema. No pneumothorax. Atherosclerosis of the aorta. IMPRESSION: Slightly enlarged heart size. No acute infiltrate or edema. Mild basilar atelectasis. Electronically Signed   By: Donavan Foil M.D.   On: 08/07/2016 20:24   Scheduled Meds: . amLODipine  10 mg Oral Daily  . aspirin  81 mg Oral Daily  . carvedilol  12.5 mg Oral BID WC  . hydrALAZINE  75 mg Oral TID  . isosorbide mononitrate  60 mg Oral Daily  . LORazepam  1 mg Intravenous Once  . multivitamin  1 tablet Oral QHS  . sevelamer carbonate  2,400 mg Oral TID WC   Continuous Infusions:   LOS: 1 day    Time spent: > 35 min  Velvet Bathe, MD [facility] Pager [phone]  If 7PM-7AM, please contact night-coverage [email or web address] Password [email or web address] 08/08/2016, 2:22 PM

## 2016-08-08 NOTE — Consult Note (Signed)
Reason for Consult: To manage dialysis and dialysis related needs Referring Physician: [facility]/ Dr. Paula Libra is an 76 y.o. male.  HPI: Pt is a 89M with ESRD on HD TTS at [facility] who we are asked to see for management of ESRD and dialysis related needs.  Presented to [facility] ED for weakness and falls.  Workup has been largely negative so far which has included a CT head and C-spine without fracture (but 3cm left thyroid nodule), EKG with NSR, negative CXR.  CK in the 2000s.  Has no complaints upon my evaluation.  Per Audley Hose, we have been steadily lowering his EDW in dialysis- so he has some unexplained wt loss. He is AAO x 3 today.     Dialyzes at [facility]  EDW 71.5 kg HD 4 hrs Bath 2K/2Ca  Dialyzer F 180 Heparin 2000 u bolus Access AVF Calcitriol 0.25 q treatment Mircera 60 q 2 weeks last given 08/04/16 No iron   Past Medical History:  Diagnosis Date  . Anemia   . Cancer ([place])    skin cancer - face  . Chronic kidney disease    dialysis T-TH-Sa  . Diabetes mellitus without complication ([place])    not taking any meds  . GERD (gastroesophageal reflux disease)   . Hypertension   . Pneumonia   . Shortness of breath     Past Surgical History:  Procedure Laterality Date  . APPENDECTOMY    . AV FISTULA PLACEMENT Left 09/11/2013   Procedure:  CREATION- RADIOCEPHALIC arteriovenous fistula;  Surgeon: Conrad Exira, MD;  Location: [place];  Service: Vascular;  Laterality: Left;  . FISTULOGRAM Left 01/31/2014   Procedure: FISTULOGRAM;  Surgeon: Conrad Forest Home, MD;  Location: [place];  Service: Vascular;  Laterality: Left;  . LIGATION OF COMPETING BRANCHES OF ARTERIOVENOUS FISTULA Left 01/31/2014   Procedure: REVISION OF ARTERIOVENOUS FISTULA;  Surgeon: Conrad Boonville, MD;  Location: [facility] OR;  Service: Vascular;  Laterality: Left;    Family History  Problem Relation Age of Onset  . Diabetes Mother   . Heart disease Mother   . Hypertension Mother   . Diabetes Father   .  Hypertension Father   . Peripheral vascular disease Father   . Hypertension Sister     Social History:  reports that he quit smoking about 22 years ago. He has never used smokeless tobacco. He reports that he does not drink alcohol or use drugs.  Allergies:  Allergies  Allergen Reactions  . Penicillins     Unknown    Medications: I have reviewed the patient's current medications.   Results for orders placed or performed during the hospital encounter of 08/07/16 (from the past 48 hour(s))  MRSA PCR Screening     Status: None   Collection Time: 08/07/16  6:19 PM  Result Value Ref Range   MRSA by PCR NEGATIVE NEGATIVE    Comment:        The GeneXpert MRSA Assay (FDA approved for NASAL specimens only), is one component of a comprehensive MRSA colonization surveillance program. It is not intended to diagnose MRSA infection nor to guide or monitor treatment for MRSA infections.   Comprehensive metabolic panel     Status: Abnormal   Collection Time: 08/07/16  8:47 PM  Result Value Ref Range   Sodium 138 135 - 145 mmol/L   Potassium 3.0 (L) 3.5 - 5.1 mmol/L   Chloride 97 (L) 101 - 111 mmol/L   CO2 24 22 - 32  mmol/L   Glucose, Bld 106 (H) 65 - 99 mg/dL   BUN 34 (H) 6 - 20 mg/dL   Creatinine, Ser 6.86 (H) 0.61 - 1.24 mg/dL   Calcium 7.9 (L) 8.9 - 10.3 mg/dL   Total Protein 6.2 (L) 6.5 - 8.1 g/dL   Albumin 2.8 (L) 3.5 - 5.0 g/dL   AST 77 (H) 15 - 41 U/L   ALT 26 17 - 63 U/L   Alkaline Phosphatase 52 38 - 126 U/L   Total Bilirubin 0.7 0.3 - 1.2 mg/dL   GFR calc non Af Amer 7 (L) >60 mL/min   GFR calc Af Amer 8 (L) >60 mL/min    Comment: (NOTE) The eGFR has been calculated using the CKD EPI equation. This calculation has not been validated in all clinical situations. eGFR's persistently <60 mL/min signify possible Chronic Kidney Disease.    Anion gap 17 (H) 5 - 15  CBC with Differential/Platelet     Status: Abnormal   Collection Time: 08/07/16  8:47 PM  Result Value  Ref Range   WBC 6.1 4.0 - 10.5 K/uL   RBC 3.89 (L) 4.22 - 5.81 MIL/uL   Hemoglobin 11.6 (L) 13.0 - 17.0 g/dL   HCT 34.3 (L) 39.0 - 52.0 %   MCV 88.2 78.0 - 100.0 fL   MCH 29.8 26.0 - 34.0 pg   MCHC 33.8 30.0 - 36.0 g/dL   RDW 15.1 11.5 - 15.5 %   Platelets 152 150 - 400 K/uL   Neutrophils Relative % 71 %   Neutro Abs 4.3 1.7 - 7.7 K/uL   Lymphocytes Relative 18 %   Lymphs Abs 1.1 0.7 - 4.0 K/uL   Monocytes Relative 11 %   Monocytes Absolute 0.7 0.1 - 1.0 K/uL   Eosinophils Relative 0 %   Eosinophils Absolute 0.0 0.0 - 0.7 K/uL   Basophils Relative 0 %   Basophils Absolute 0.0 0.0 - 0.1 K/uL  Ammonia     Status: None   Collection Time: 08/07/16  8:47 PM  Result Value Ref Range   Ammonia 25 9 - 35 umol/L  TSH     Status: None   Collection Time: 08/07/16  8:47 PM  Result Value Ref Range   TSH 0.381 0.350 - 4.500 uIU/mL    Comment: Performed by a 3rd Generation assay with a functional sensitivity of <=0.01 uIU/mL.  CK     Status: Abnormal   Collection Time: 08/07/16  8:47 PM  Result Value Ref Range   Total CK 2,938 (H) 49 - 397 U/L  Basic metabolic panel     Status: Abnormal   Collection Time: 08/08/16  4:22 AM  Result Value Ref Range   Sodium 139 135 - 145 mmol/L   Potassium 3.3 (L) 3.5 - 5.1 mmol/L   Chloride 102 101 - 111 mmol/L   CO2 24 22 - 32 mmol/L   Glucose, Bld 151 (H) 65 - 99 mg/dL   BUN 41 (H) 6 - 20 mg/dL   Creatinine, Ser 7.19 (H) 0.61 - 1.24 mg/dL   Calcium 7.9 (L) 8.9 - 10.3 mg/dL   GFR calc non Af Amer 7 (L) >60 mL/min   GFR calc Af Amer 8 (L) >60 mL/min    Comment: (NOTE) The eGFR has been calculated using the CKD EPI equation. This calculation has not been validated in all clinical situations. eGFR's persistently <60 mL/min signify possible Chronic Kidney Disease.    Anion gap 13 5 - 15  CBC  Status: Abnormal   Collection Time: 08/08/16  4:22 AM  Result Value Ref Range   WBC 6.3 4.0 - 10.5 K/uL    Comment: WHITE COUNT CONFIRMED ON SMEAR   RBC  3.44 (L) 4.22 - 5.81 MIL/uL   Hemoglobin 10.1 (L) 13.0 - 17.0 g/dL   HCT 30.4 (L) 39.0 - 52.0 %   MCV 88.4 78.0 - 100.0 fL   MCH 29.4 26.0 - 34.0 pg   MCHC 33.2 30.0 - 36.0 g/dL   RDW 15.0 11.5 - 15.5 %   Platelets 167 150 - 400 K/uL  CK     Status: Abnormal   Collection Time: 08/08/16  4:22 AM  Result Value Ref Range   Total CK 2,359 (H) 49 - 397 U/L  Renal function panel     Status: Abnormal   Collection Time: 08/08/16 11:52 AM  Result Value Ref Range   Sodium 135 135 - 145 mmol/L   Potassium 3.2 (L) 3.5 - 5.1 mmol/L   Chloride 98 (L) 101 - 111 mmol/L   CO2 25 22 - 32 mmol/L   Glucose, Bld 195 (H) 65 - 99 mg/dL   BUN 44 (H) 6 - 20 mg/dL   Creatinine, Ser 7.62 (H) 0.61 - 1.24 mg/dL   Calcium 8.0 (L) 8.9 - 10.3 mg/dL   Phosphorus 5.8 (H) 2.5 - 4.6 mg/dL   Albumin 2.6 (L) 3.5 - 5.0 g/dL   GFR calc non Af Amer 6 (L) >60 mL/min   GFR calc Af Amer 7 (L) >60 mL/min    Comment: (NOTE) The eGFR has been calculated using the CKD EPI equation. This calculation has not been validated in all clinical situations. eGFR's persistently <60 mL/min signify possible Chronic Kidney Disease.    Anion gap 12 5 - 15  CBC     Status: Abnormal   Collection Time: 08/08/16 11:52 AM  Result Value Ref Range   WBC 6.0 4.0 - 10.5 K/uL   RBC 3.51 (L) 4.22 - 5.81 MIL/uL   Hemoglobin 10.3 (L) 13.0 - 17.0 g/dL   HCT 31.0 (L) 39.0 - 52.0 %   MCV 88.3 78.0 - 100.0 fL   MCH 29.3 26.0 - 34.0 pg   MCHC 33.2 30.0 - 36.0 g/dL   RDW 14.9 11.5 - 15.5 %   Platelets 154 150 - 400 K/uL    Mr Brain Wo Contrast  Result Date: 08/08/2016 CLINICAL DATA:  76 y/o M; bilateral lower extremity weakness and confusion. EXAM: MRI HEAD WITHOUT CONTRAST TECHNIQUE: Multiplanar, multiecho pulse sequences of the brain and surrounding structures were obtained without intravenous contrast. COMPARISON:  Concurrent MRI of the cervical spine. 08/07/2016 CT of the head and cervical spine. FINDINGS: Brain: No acute infarction,  hemorrhage, hydrocephalus, extra-axial collection or mass lesion. Foci of T2 FLAIR hyperintense signal abnormality in subcortical and periventricular white matter are compatible with mild chronic microvascular ischemic changes. There is moderate brain parenchymal volume loss of both the supratentorial and infratentorial brain. Vascular: Normal flow voids. Skull and upper cervical spine: Normal marrow signal. Sinuses/Orbits: Negative. Other: None. IMPRESSION: 1. No acute intracranial abnormality is identified. 2. Mild chronic microvascular ischemic changes of the brain. 3. Moderate supratentorial and infratentorial brain parenchymal volume loss. Electronically Signed   By: Kristine Garbe M.D.   On: 08/08/2016 02:02   Mr Cervical Spine Wo Contrast  Result Date: 08/08/2016 CLINICAL DATA:  77 y/o M; bilateral lower extremity weakness and confusion. EXAM: MRI CERVICAL SPINE WITHOUT CONTRAST TECHNIQUE: Multiplanar, multisequence MR imaging  of the cervical spine was performed. No intravenous contrast was administered. COMPARISON:  Concurrent MRI of the head. 08/07/2016 CT of the head and cervical spine. FINDINGS: Alignment: Physiologic. Vertebrae: No fracture, evidence of discitis, or bone lesion. Cord: Normal signal and morphology. Posterior Fossa, vertebral arteries, paraspinal tissues: 31 mm nodule within the left lobe of the thyroid gland. Disc levels: C2-3: No significant disc displacement, foraminal narrowing, or canal stenosis. C3-4: Mild right-sided uncovertebral and facet hypertrophy with mild right foraminal narrowing. No significant left foraminal narrowing or canal stenosis. C4-5: No significant disc displacement, foraminal narrowing, or canal stenosis. C5-6: Small disc osteophyte complex and bilateral uncovertebral and facet hypertrophy. Mild bilateral foraminal narrowing. Effacement of the anterior thecal sac with anterior cord contact and slight anterior cord flattening. No significant canal  stenosis. C6-7: Disc osteophyte complex eccentric to the right and right greater than left uncovertebral and facet hypertrophy. Severe right and moderate left foraminal narrowing. Mild canal stenosis and mild anterior cord flattening. C7-T1: No significant disc displacement, foraminal narrowing, or canal stenosis. IMPRESSION: 1. No acute osseous abnormality or abnormal cord signal. 2. Cervical degenerative changes are greatest at the C6-7 level where there is severe right/ moderate left foraminal narrowing and mild canal stenosis. Electronically Signed   By: Kristine Garbe M.D.   On: 08/08/2016 01:59   Dg Chest Port 1 View  Result Date: 08/07/2016 CLINICAL DATA:  Acute encephalopathy EXAM: PORTABLE CHEST 1 VIEW COMPARISON:  08/07/2016 FINDINGS: No acute infiltrate or E fusion. Minimal atelectasis at the lung bases. The heart is slightly enlarged. No edema. No pneumothorax. Atherosclerosis of the aorta. IMPRESSION: Slightly enlarged heart size. No acute infiltrate or edema. Mild basilar atelectasis. Electronically Signed   By: Donavan Foil M.D.   On: 08/07/2016 20:24    ROS: all other systems reviewed and are negative except as per HPI  Blood pressure (!) 109/53, pulse 78, temperature 97 F (36.1 C), temperature source Oral, resp. rate 16, weight 71 kg (156 lb 8.4 oz), SpO2 100 %. .  GEN: elderly man sitting in bed NAD HEENT: EOMI, PERRL NECK: no JVD PULM: CTAB CV RRR ABD thin, soft EXT no LE edema NEURO AAO x 3 ACCESS: AVF  Assessment/Plan: 1 Weakness/falls/near syncope: largely negative workup so far. Has had some weight loss upon review of EDWs; ? If these two are related.  MRIs are pending. PT c/s.   2 ESRD: TTS at [facility].  Will dialyze on schedule today 3 Hypertension: on amlodipine 10 mg daily, coreg 12.5 BID, hydralazine 75 TID, and imdur 60 daily 4.  Volume status: appears like EDW needs to be lowered again; challenging 0.5 kg.  5. Anemia of ESRD: Last outpt hgb 11.2,  mircera given 08/04/16. 6. Metabolic Bone Disease: Continue renvela 3 tabs TID WC and calcitriol 0.25 TIW with Hd.  Last Phos 3.6 07/09/16; last PTH 504 05/14/2016   Philmore Lepore 08/08/2016, 3:48 PM

## 2016-08-09 LAB — HEPATITIS B SURFACE ANTIGEN: HEP B S AG: NEGATIVE

## 2016-08-09 LAB — HEPATITIS B SURFACE ANTIBODY,QUALITATIVE: Hep B S Ab: REACTIVE

## 2016-08-09 MED ORDER — PRO-STAT SUGAR FREE PO LIQD
30.0000 mL | Freq: Two times a day (BID) | ORAL | Status: DC
  Administered 2016-08-09 – 2016-08-10 (×3): 30 mL via ORAL
  Filled 2016-08-09 (×4): qty 30

## 2016-08-09 NOTE — Progress Notes (Signed)
  [facility] Progress Note   Assessment/ Plan:    Dialyzes at [facility]  EDW 71.5 kg HD 4 hrs Bath 2K/2Ca  Dialyzer F 180 Heparin 2000 u bolus Access AVF Calcitriol 0.25 q treatment Mircera 60 q 2 weeks last given 08/04/16 No iron  1 Weakness/falls/near syncope: largely negative workup so far. Has had some weight loss upon review of EDWs; ? If these two are related.  MRI brain with microvascular disease and MRI cervical spine with C6-7 neuroforaminal stenosis; no acute osseous abnormality or cord signal. PT c/s.   2 ESRD: TTS at [place].  Will dialyze on schedule today; next planned HD Tuesday 3 Hypertension: on amlodipine 10 mg daily, coreg 12.5 BID, hydralazine 75 TID, and imdur 60 daily 4.  Volume status: appears like EDW needs to be lowered again; challenging 0.5 kg; will lower EDW to 69. 5 kg based on post-weights.  5. Anemia of ESRD: Last outpt hgb 11.2, mircera given 08/04/16. 6. Metabolic Bone Disease: Continue renvela 3 tabs TID WC and calcitriol 0.25 TIW with Hd.  Last Phos 3.6 07/09/16; last PTH 504 05/14/2016 7.  Nutrition: albumin 2.6, prostat  Subjective:    Feels Ok.  Apparently had a fall last night (was found on floor).  Will get PT   Objective:   BP (!) 137/57 (BP Location: Right Arm)   Pulse 83   Temp 98.4 F (36.9 C) (Oral)   Resp 17   Wt 69.4 kg (153 lb)   SpO2 96%   BMI 23.26 kg/m   Physical Exam: GEN: elderly man sitting in bed NAD HEENT: EOMI, PERRL NECK: no JVD PULM: CTAB CV RRR ABD thin, soft EXT no LE edema NEURO AAO x 3 ACCESS: AVF  Labs: BMET  Recent Labs Lab 08/07/16 2047 08/08/16 0422 08/08/16 1152  NA 138 139 135  K 3.0* 3.3* 3.2*  CL 97* 102 98*  CO2 24 24 25   GLUCOSE 106* 151* 195*  BUN 34* 41* 44*  CREATININE 6.86* 7.19* 7.62*  CALCIUM 7.9* 7.9* 8.0*  PHOS  --   --  5.8*   CBC  Recent Labs Lab 08/07/16 2047 08/08/16 0422 08/08/16 1152  WBC 6.1 6.3 6.0  NEUTROABS 4.3  --   --   HGB 11.6* 10.1*  10.3*  HCT 34.3* 30.4* 31.0*  MCV 88.2 88.4 88.3  PLT 152 167 154    @IMGRELPRIORS @ Medications:    . amLODipine  10 mg Oral Daily  . aspirin  81 mg Oral Daily  . carvedilol  12.5 mg Oral BID WC  . hydrALAZINE  75 mg Oral TID  . isosorbide mononitrate  60 mg Oral Daily  . LORazepam  1 mg Intravenous Once  . multivitamin  1 tablet Oral QHS  . sevelamer carbonate  2,400 mg Oral TID WC     [name] MD 08/09/2016, 1:19 PM

## 2016-08-09 NOTE — Progress Notes (Signed)
PROGRESS NOTE   Lucas Calderon  EZM:[id number] DOB: 03-11-1941 DOA: 08/07/2016 PCP: Maryella Shivers, MD   Brief Narrative:  76 y.o. male with ESRD on hemodialysis on Tuesdays Thursdays and Saturday was brought to the ER at [facility] after patient had a fall 2 days ago. Since the fall patient is feeling weak to walk. Patient states he lost balance and fell.   Assessment & Plan:   Principal Problem:   Near syncope - I suspect patient most likely has generalized weakness secondary to deconditioning. 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    End stage renal disease ([place]) - consulted Nephrology    Rhabdomyolysis - trending down. Unable to administer fluids secondary to ESRD.  DVT prophylaxis: SCD's Code Status: Full Family Communication: none at bedside Disposition Plan: pending PT evaluation and recommendations   Consultants:   Nephrology   Procedures: None   Antimicrobials: (None   Subjective: Pt has no new complaint today.   Objective: Vitals:   08/08/16 2059 08/08/16 2300 08/08/16 2304 08/09/16 0509  BP: (!) 159/107 (!) 140/57 (!) 151/51 (!) 137/57  Pulse: 86 81 81 83  Resp: 18 18 16 17   Temp: 98.6 F (37 C) 98.4 F (36.9 C) 99.2 F (37.3 C) 98.4 F (36.9 C)  TempSrc: Oral Oral Oral Oral  SpO2: 96% 96% 96% 96%  Weight:   69.4 kg (153 lb)     Intake/Output Summary (Last 24 hours) at 08/09/16 1605 Last data filed at 08/09/16 0519  Gross per 24 hour  Intake                0 ml  Output             1600 ml  Net            -1600 ml   Filed Weights   08/08/16 1320 08/08/16 1730 08/08/16 2304  Weight: 71 kg (156 lb 8.4 oz) 68 kg (149 lb 14.6 oz) 69.4 kg (153 lb)    Examination:  General exam: Appears calm and comfortable, in nad. Respiratory system:  Clear to auscultation. Respiratory effort normal. Cardiovascular system: S1 & S2 heard, RRR.  Gastrointestinal system: Abdomen is nondistended, soft and nontender. No organomegaly or masses felt. Normal bowel sounds heard. Central nervous system: Alert and oriented. No focal neurological deficits. Extremities: Symmetric 5 x 5 power Skin: No rashes, lesions or ulcers, on limited exam. Psychiatry:  Mood & affect appropriate.   Data Reviewed: I have personally reviewed following labs and imaging studies  CBC:  Recent Labs Lab 08/07/16 2047 08/08/16 0422 08/08/16 1152  WBC 6.1 6.3 6.0  NEUTROABS 4.3  --   --   HGB 11.6* 10.1* 10.3*  HCT 34.3* 30.4* 31.0*  MCV 88.2 88.4 88.3  PLT 152 167 503   Basic Metabolic Panel:  Recent Labs Lab 08/07/16 2047 08/08/16 0422 08/08/16 1152  NA 138 139 135  K 3.0* 3.3* 3.2*  CL 97* 102 98*  CO2 24 24 25   GLUCOSE 106* 151* 195*  BUN 34* 41* 44*  CREATININE 6.86* 7.19* 7.62*  CALCIUM 7.9* 7.9* 8.0*  PHOS  --   --  5.8*   GFR: CrCl cannot be calculated (Unknown ideal weight.). Liver Function Tests:  Recent Labs Lab 08/07/16 2047 08/08/16 1152  AST 77*  --  ALT 26  --   ALKPHOS 52  --   BILITOT 0.7  --   PROT 6.2*  --   ALBUMIN 2.8* 2.6*   No results for input(s): LIPASE, AMYLASE in the last 168 hours.  Recent Labs Lab 08/07/16 2047  AMMONIA 25   Coagulation Profile: No results for input(s): INR, PROTIME in the last 168 hours. Cardiac Enzymes:  Recent Labs Lab 08/07/16 2047 08/08/16 0422  CKTOTAL 2,938* 2,359*   BNP (last 3 results) No results for input(s): PROBNP in the last 8760 hours. HbA1C: No results for input(s): HGBA1C in the last 72 hours. CBG: No results for input(s): GLUCAP in the last 168 hours. Lipid Profile: No results for input(s): CHOL, HDL, LDLCALC, TRIG, CHOLHDL, LDLDIRECT in the last 72 hours. Thyroid Function Tests:  Recent Labs  08/07/16 2047  TSH 0.381   Anemia Panel: No results for  input(s): VITAMINB12, FOLATE, FERRITIN, TIBC, IRON, RETICCTPCT in the last 72 hours. Sepsis Labs: No results for input(s): PROCALCITON, LATICACIDVEN in the last 168 hours.  Recent Results (from the past 240 hour(s))  MRSA PCR Screening     Status: None   Collection Time: 08/07/16  6:19 PM  Result Value Ref Range Status   MRSA by PCR NEGATIVE NEGATIVE Final    Comment:        The GeneXpert MRSA Assay (FDA approved for NASAL specimens only), is one component of a comprehensive MRSA colonization surveillance program. It is not intended to diagnose MRSA infection nor to guide or monitor treatment for MRSA infections.          Radiology Studies: Mr Brain 61 Contrast  Result Date: 08/08/2016 CLINICAL DATA:  76 y/o M; bilateral lower extremity weakness and confusion. EXAM: MRI HEAD WITHOUT CONTRAST TECHNIQUE: Multiplanar, multiecho pulse sequences of the brain and surrounding structures were obtained without intravenous contrast. COMPARISON:  Concurrent MRI of the cervical spine. 08/07/2016 CT of the head and cervical spine. FINDINGS: Brain: No acute infarction, hemorrhage, hydrocephalus, extra-axial collection or mass lesion. Foci of T2 FLAIR hyperintense signal abnormality in subcortical and periventricular white matter are compatible with mild chronic microvascular ischemic changes. There is moderate brain parenchymal volume loss of both the supratentorial and infratentorial brain. Vascular: Normal flow voids. Skull and upper cervical spine: Normal marrow signal. Sinuses/Orbits: Negative. Other: None. IMPRESSION: 1. No acute intracranial abnormality is identified. 2. Mild chronic microvascular ischemic changes of the brain. 3. Moderate supratentorial and infratentorial brain parenchymal volume loss. Electronically Signed   By: Kristine Garbe M.D.   On: 08/08/2016 02:02   Mr Cervical Spine Wo Contrast  Result Date: 08/08/2016 CLINICAL DATA:  76 y/o M; bilateral lower extremity  weakness and confusion. EXAM: MRI CERVICAL SPINE WITHOUT CONTRAST TECHNIQUE: Multiplanar, multisequence MR imaging of the cervical spine was performed. No intravenous contrast was administered. COMPARISON:  Concurrent MRI of the head. 08/07/2016 CT of the head and cervical spine. FINDINGS: Alignment: Physiologic. Vertebrae: No fracture, evidence of discitis, or bone lesion. Cord: Normal signal and morphology. Posterior Fossa, vertebral arteries, paraspinal tissues: 31 mm nodule within the left lobe of the thyroid gland. Disc levels: C2-3: No significant disc displacement, foraminal narrowing, or canal stenosis. C3-4: Mild right-sided uncovertebral and facet hypertrophy with mild right foraminal narrowing. No significant left foraminal narrowing or canal stenosis. C4-5: No significant disc displacement, foraminal narrowing, or canal stenosis. C5-6: Small disc osteophyte complex and bilateral uncovertebral and facet hypertrophy. Mild bilateral foraminal narrowing. Effacement of the anterior thecal sac with anterior cord contact and  slight anterior cord flattening. No significant canal stenosis. C6-7: Disc osteophyte complex eccentric to the right and right greater than left uncovertebral and facet hypertrophy. Severe right and moderate left foraminal narrowing. Mild canal stenosis and mild anterior cord flattening. C7-T1: No significant disc displacement, foraminal narrowing, or canal stenosis. IMPRESSION: 1. No acute osseous abnormality or abnormal cord signal. 2. Cervical degenerative changes are greatest at the C6-7 level where there is severe right/ moderate left foraminal narrowing and mild canal stenosis. Electronically Signed   By: Kristine Garbe M.D.   On: 08/08/2016 01:59   Dg Chest Port 1 View  Result Date: 08/07/2016 CLINICAL DATA:  Acute encephalopathy EXAM: PORTABLE CHEST 1 VIEW COMPARISON:  08/07/2016 FINDINGS: No acute infiltrate or E fusion. Minimal atelectasis at the lung bases. The  heart is slightly enlarged. No edema. No pneumothorax. Atherosclerosis of the aorta. IMPRESSION: Slightly enlarged heart size. No acute infiltrate or edema. Mild basilar atelectasis. Electronically Signed   By: Donavan Foil M.D.   On: 08/07/2016 20:24   Scheduled Meds: . amLODipine  10 mg Oral Daily  . aspirin  81 mg Oral Daily  . carvedilol  12.5 mg Oral BID WC  . feeding supplement (PRO-STAT SUGAR FREE 64)  30 mL Oral BID  . hydrALAZINE  75 mg Oral TID  . isosorbide mononitrate  60 mg Oral Daily  . LORazepam  1 mg Intravenous Once  . multivitamin  1 tablet Oral QHS  . sevelamer carbonate  2,400 mg Oral TID WC   Continuous Infusions:   LOS: 2 days    Time spent: > 35 min  Velvet Bathe, MD [facility] Pager [phone]  If 7PM-7AM, please contact night-coverage [email or web address] Password [email or web address] 08/09/2016, 4:05 PM

## 2016-08-09 NOTE — Evaluation (Signed)
Physical Therapy Evaluation Patient Details Name: Lucas Calderon MRN: [id number] DOB: 1941-06-30 Today's Date: 08/09/2016   History of Present Illness  76 y.o. male with ESRD on hemodialysis on Tuesdays Thursdays and Saturday was brought to the ER at [facility] after patient had a fall 2 days ago. Since the fall patient is feeling weak to walk. Patient states he lost balance and fell.   Clinical Impression   Pt admitted with above diagnosis. Pt currently with functional limitations due to the deficits listed below (see PT Problem List). Activity tolerance limited by postural hypotension; RN notified (see below); Noted also LLE more difficult to move than Right; worth further investigation -- will plan to compare MMT LEs next session;  Pt will benefit from skilled PT to increase their independence and safety with mobility to allow discharge to the venue listed below.       Follow Up Recommendations Home health PT;Other (comment) [facility] for chronic disease management)    Equipment Recommendations  Rolling walker with 5" wheels;3in1 (PT)    Recommendations for Other Services OT consult     Precautions / Restrictions Precautions Precautions: Fall Precaution Comments: Orthostatic on PT eval      Mobility  Bed Mobility Overal bed mobility: Needs Assistance Bed Mobility: Supine to Sit     Supine to sit: Mod assist     General bed mobility comments: Light mod handheld assist to pull to sit  Transfers Overall transfer level: Needs assistance Equipment used: Rolling walker (2 wheeled) Transfers: Sit to/from Stand Sit to Stand: Min assist         General transfer comment: Min assist to rise and steady  Ambulation/Gait Ambulation/Gait assistance: Min assist Ambulation Distance (Feet):  (pivot steps bed to chair) Assistive device: Rolling walker (2 wheeled) Gait Pattern/deviations: Shuffle     General Gait Details: Moved quickly to the chair due to BP drop in  standing  Stairs            Wheelchair Mobility    Modified Rankin (Stroke Patients Only)       Balance                                             Pertinent Vitals/Pain Pain Assessment: No/denies pain    Home Living Family/patient expects to be discharged to:: Private residence Living Arrangements: Other relatives (Sister) Available Help at Discharge: Family;Available PRN/intermittently   Home Access: Ramped entrance     Home Layout: One level Home Equipment: Cane - single point      Prior Function Level of Independence: Independent with assistive device(s)         Comments: Cane for amb     Hand Dominance        Extremity/Trunk Assessment   Upper Extremity Assessment Upper Extremity Assessment: Generalized weakness    Lower Extremity Assessment Lower Extremity Assessment: Generalized weakness (Noting more difficulty moving LLE than RLE)       Communication   Communication: No difficulties  Cognition Arousal/Alertness: Awake/alert Behavior During Therapy: WFL for tasks assessed/performed;Flat affect Overall Cognitive Status: No family/caregiver present to determine baseline cognitive functioning                 General Comments: At the end of session, he requested help with dialing the phone; Knowledgeable about needing to dial 9, then asked me to dial a 5 digit number  General Comments General comments (skin integrity, edema, etc.):   08/09/16 1600 08/09/16 1610 08/09/16 1611  Vital Signs  Patient Position (if appropriate) Orthostatic Vitals --  --   Orthostatic Lying   BP- Lying 113/50 --  --   Pulse- Lying 76 --  --   Orthostatic Sitting  BP- Sitting 105/46 108/49 (in recliner, feet down) 104/50  Pulse- Sitting 79 77 76  Orthostatic Standing at 0 minutes  BP- Standing at 0 minutes (!) 87/41 (Symptomatic for feeling lousy) --  --   Pulse- Standing at 0 minutes 79 --  --        Exercises      Assessment/Plan    PT Assessment Patient needs continued PT services  PT Problem List Decreased strength;Decreased activity tolerance;Decreased balance;Decreased mobility;Decreased coordination;Decreased cognition;Decreased knowledge of use of DME;Decreased safety awareness;Cardiopulmonary status limiting activity;Decreased knowledge of precautions          PT Treatment Interventions DME instruction;Gait training;Functional mobility training;Therapeutic activities;Therapeutic exercise;Balance training;Patient/family education    PT Goals (Current goals can be found in the Care Plan section)  Acute Rehab PT Goals Patient Stated Goal: Did not state, but seemed to be pleased to be OOB PT Goal Formulation: With patient Time For Goal Achievement: 08/23/16 Potential to Achieve Goals: Good    Frequency Min 3X/week   Barriers to discharge        Co-evaluation               End of Session Equipment Utilized During Treatment: Gait belt Activity Tolerance: Other (comment) (Limited by hypotension in standing) Patient left: in chair;with call bell/phone within reach;with chair alarm set Nurse Communication: Mobility status         Time: 2330-0762 PT Time Calculation (min) (ACUTE ONLY): 32 min   Charges:   PT Evaluation $PT Eval Moderate Complexity: 1 Procedure PT Treatments $Therapeutic Activity: 8-22 mins   PT G Codes:        Colletta Maryland 08/09/2016, 5:29 PM  Roney Marion, [place] Pager [phone] Office [phone]

## 2016-08-09 NOTE — Progress Notes (Signed)
Called to room by niece. Growth noticed behind left ear on neck that resembles a raised mole with a calloused looking texture. No drainage.Notified Public house manager.

## 2016-08-10 NOTE — Progress Notes (Signed)
PROGRESS NOTE   Lucas Calderon  HDQ:[id number] DOB: [birth year] DOA: 08/07/2016 PCP: Maryella Shivers, MD   Brief Narrative:  76 y.o. male with ESRD on hemodialysis on Tuesdays Thursdays and Saturday was brought to the ER at [facility] after patient had a fall 2 days ago. Since the fall patient is feeling weak to walk. Patient states he lost balance and fell.   Assessment & Plan:   Principal Problem:   Near syncope - I suspect patient most likely has generalized weakness secondary to deconditioning. - PT recommending home health PR and rolling walker. D/c planning today and will d/c next am. Pt orthostatic will place ted hose and reassess next am.  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    End stage renal disease ([place]) - consulted Nephrology    Rhabdomyolysis - trending down. Unable to administer fluids secondary to ESRD.  DVT prophylaxis: SCD's Code Status: Full Family Communication: none at bedside Disposition Plan: pending PT evaluation and recommendations   Consultants:   Nephrology   Procedures: None   Antimicrobials: (None   Subjective: Pt has no new complaint today. No acute issues overnight reported.  Objective: Vitals:   08/09/16 0509 08/09/16 2217 08/10/16 0513 08/10/16 0933  BP: (!) 137/57 (!) 121/53 (!) 135/53 (!) 144/48  Pulse: 83 73 73 76  Resp: 17 18 18 16   Temp: 98.4 F (36.9 C) 99 F (37.2 C) 97.9 F (36.6 C) 99.4 F (37.4 C)  TempSrc: Oral Oral Oral Oral  SpO2: 96% 99% 99% 96%  Weight:   69.7 kg (153 lb 9.6 oz)     Intake/Output Summary (Last 24 hours) at 08/10/16 1551 Last data filed at 08/10/16 1448  Gross per 24 hour  Intake              480 ml  Output                0 ml  Net              480 ml   Filed Weights   08/08/16 1730 08/08/16 2304 08/10/16 0513    Weight: 68 kg (149 lb 14.6 oz) 69.4 kg (153 lb) 69.7 kg (153 lb 9.6 oz)    Examination:  General exam: Appears calm and comfortable, in nad. Respiratory system: Clear to auscultation. Respiratory effort normal. Cardiovascular system: S1 & S2 heard, RRR.  Gastrointestinal system: Abdomen is nondistended, soft and nontender. No organomegaly or masses felt. Normal bowel sounds heard. Central nervous system: Alert and oriented. No focal neurological deficits. Extremities: Symmetric 5 x 5 power Skin: No rashes, lesions or ulcers, on limited exam. Psychiatry:  Mood & affect appropriate.   Data Reviewed: I have personally reviewed following labs and imaging studies  CBC:  Recent Labs Lab 08/07/16 2047 08/08/16 0422 08/08/16 1152  WBC 6.1 6.3 6.0  NEUTROABS 4.3  --   --   HGB 11.6* 10.1* 10.3*  HCT 34.3* 30.4* 31.0*  MCV 88.2 88.4 88.3  PLT 152 167 119   Basic Metabolic Panel:  Recent Labs Lab 08/07/16 2047 08/08/16 0422 08/08/16 1152  NA 138 139 135  K 3.0* 3.3* 3.2*  CL 97* 102 98*  CO2 24 24 25   GLUCOSE 106* 151* 195*  BUN 34* 41* 44*  CREATININE 6.86* 7.19* 7.62*  CALCIUM 7.9* 7.9* 8.0*  PHOS  --   --  5.8*   GFR: CrCl cannot be calculated (Unknown ideal weight.). Liver Function Tests:  Recent Labs Lab 08/07/16 2047 08/08/16 1152  AST 77*  --   ALT 26  --   ALKPHOS 52  --   BILITOT 0.7  --   PROT 6.2*  --   ALBUMIN 2.8* 2.6*   No results for input(s): LIPASE, AMYLASE in the last 168 hours.  Recent Labs Lab 08/07/16 2047  AMMONIA 25   Coagulation Profile: No results for input(s): INR, PROTIME in the last 168 hours. Cardiac Enzymes:  Recent Labs Lab 08/07/16 2047 08/08/16 0422  CKTOTAL 2,938* 2,359*   BNP (last 3 results) No results for input(s): PROBNP in the last 8760 hours. HbA1C: No results for input(s): HGBA1C in the last 72 hours. CBG: No results for input(s): GLUCAP in the last 168 hours. Lipid Profile: No results for input(s):  CHOL, HDL, LDLCALC, TRIG, CHOLHDL, LDLDIRECT in the last 72 hours. Thyroid Function Tests:  Recent Labs  08/07/16 2047  TSH 0.381   Anemia Panel: No results for input(s): VITAMINB12, FOLATE, FERRITIN, TIBC, IRON, RETICCTPCT in the last 72 hours. Sepsis Labs: No results for input(s): PROCALCITON, LATICACIDVEN in the last 168 hours.  Recent Results (from the past 240 hour(s))  MRSA PCR Screening     Status: None   Collection Time: 08/07/16  6:19 PM  Result Value Ref Range Status   MRSA by PCR NEGATIVE NEGATIVE Final    Comment:        The GeneXpert MRSA Assay (FDA approved for NASAL specimens only), is one component of a comprehensive MRSA colonization surveillance program. It is not intended to diagnose MRSA infection nor to guide or monitor treatment for MRSA infections.      Radiology Studies: No results found. Scheduled Meds: . amLODipine  10 mg Oral Daily  . aspirin  81 mg Oral Daily  . carvedilol  12.5 mg Oral BID WC  . feeding supplement (PRO-STAT SUGAR FREE 64)  30 mL Oral BID  . hydrALAZINE  75 mg Oral TID  . isosorbide mononitrate  60 mg Oral Daily  . LORazepam  1 mg Intravenous Once  . multivitamin  1 tablet Oral QHS  . sevelamer carbonate  2,400 mg Oral TID WC   Continuous Infusions:   LOS: 3 days    Time spent: > 35 min  Velvet Bathe, MD [facility] Pager [phone]  If 7PM-7AM, please contact night-coverage [email or web address] Password [facility] 08/10/2016, 3:51 PM

## 2016-08-10 NOTE — Progress Notes (Signed)
[facility] Progress Note  [place] Orders:  [place] Bath 2K/2CaDialyzer F 180Heparin 2000 u bolusAccess AVF Calcitriol 0.25 q treatment Mircera 60 q 2 weeks last given 08/04/16 No iron  Assessment/Plan: 1. Weakness/falls/near syncope: s/p fall 3 days ago - largely negative workup so far. Has had some weight loss upon review of EDWs; ? If these two are related. MRI brain with microvascular disease and MRI cervical spine with C6-7 neuroforaminal stenosis; no acute osseous abnormality or cord signal. PT recommending home health PT 2. ESRD - TTS - cont on schedule -  3. Anemia - Hgb 10.3 on OP ESA  4. Metabolic bone disease - on Calcitriol/ Renvela 3 qac/ Last PTH 504 5. HTN/volume - BP controlled /home meds / EDW challenged last HD - Post HD wt on 1/21 68kg - plan lower EDW on d/c  6. Nutrition - renal diet/vitamins/supp   Ogechi Larina Earthly PA-C [place] Pager [phone] 08/10/2016,11:38 AM  LOS: 3 days   Pt seen, examined and agree w A/P as above.  Kelly Splinter MD [facility] pager [phone]   08/11/2016, 3:47 PM    Subjective:  "I feel fine" Up and walking with PT yesterday with walker. HD yesterday no c/os   Objective Vitals:   08/09/16 0509 08/09/16 2217 08/10/16 0513 08/10/16 0933  BP: (!) 137/57 (!) 121/53 (!) 135/53 (!) 144/48  Pulse: 83 73 73 76  Resp: 17 18 18 16   Temp: 98.4 F (36.9 C) 99 F (37.2 C) 97.9 F (36.6 C) 99.4 F (37.4 C)  TempSrc: Oral Oral Oral Oral  SpO2: 96% 99% 99% 96%  Weight:   69.7 kg (153 lb 9.6 oz)    Physical Exam General: thin frail appearing elderly male NAD Heart: RRR Lungs: CTAB Abdomen: soft ND/NT Extremities: no LE edema  Dialysis Access: LUE AVF +thrill   Additional Objective Labs: Basic Metabolic Panel:  Recent Labs Lab 08/07/16 2047 08/08/16 0422 08/08/16 1152  NA 138 139 135  K 3.0* 3.3* 3.2*  CL 97* 102 98*  CO2 24 24  25   GLUCOSE 106* 151* 195*  BUN 34* 41* 44*  CREATININE 6.86* 7.19* 7.62*  CALCIUM 7.9* 7.9* 8.0*  PHOS  --   --  5.8*   Liver Function Tests:  Recent Labs Lab 08/07/16 2047 08/08/16 1152  AST 77*  --   ALT 26  --   ALKPHOS 52  --   BILITOT 0.7  --   PROT 6.2*  --   ALBUMIN 2.8* 2.6*   No results for input(s): LIPASE, AMYLASE in the last 168 hours. CBC:  Recent Labs Lab 08/07/16 2047 08/08/16 0422 08/08/16 1152  WBC 6.1 6.3 6.0  NEUTROABS 4.3  --   --   HGB 11.6* 10.1* 10.3*  HCT 34.3* 30.4* 31.0*  MCV 88.2 88.4 88.3  PLT 152 167 154   Blood Culture    Component Value Date/Time   SDES BLOOD RIGHT HAND 09/28/2013 1220   SPECREQUEST BOTTLES DRAWN AEROBIC ONLY 10CC 09/28/2013 1220   CULT  09/28/2013 1220    NO GROWTH 5 DAYS Performed at [place] 10/04/2013 FINAL 09/28/2013 1220    Cardiac Enzymes:  Recent Labs Lab 08/07/16 2047 08/08/16 0422  CKTOTAL 2,938* 2,359*   CBG: No results for input(s): GLUCAP in the last 168 hours. Iron Studies: No results for input(s): IRON, TIBC, TRANSFERRIN, FERRITIN in the last 72 hours. Lab Results  Component  Value Date   INR 1.27 09/26/2013   Medications:  . amLODipine  10 mg Oral Daily  . aspirin  81 mg Oral Daily  . carvedilol  12.5 mg Oral BID WC  . feeding supplement (PRO-STAT SUGAR FREE 64)  30 mL Oral BID  . hydrALAZINE  75 mg Oral TID  . isosorbide mononitrate  60 mg Oral Daily  . LORazepam  1 mg Intravenous Once  . multivitamin  1 tablet Oral QHS  . sevelamer carbonate  2,400 mg Oral TID WC

## 2016-08-11 LAB — RENAL FUNCTION PANEL
Albumin: 2.7 g/dL — ABNORMAL LOW (ref 3.5–5.0)
Anion gap: 14 (ref 5–15)
BUN: 44 mg/dL — AB (ref 6–20)
CALCIUM: 7.8 mg/dL — AB (ref 8.9–10.3)
CHLORIDE: 101 mmol/L (ref 101–111)
CO2: 22 mmol/L (ref 22–32)
CREATININE: 8.9 mg/dL — AB (ref 0.61–1.24)
GFR, EST AFRICAN AMERICAN: 6 mL/min — AB (ref >60)
GFR, EST NON AFRICAN AMERICAN: 5 mL/min — AB (ref >60)
Glucose, Bld: 96 mg/dL (ref 65–99)
Phosphorus: 4.2 mg/dL (ref 2.5–4.6)
Potassium: 2.9 mmol/L — ABNORMAL LOW (ref 3.5–5.1)
SODIUM: 137 mmol/L (ref 135–145)

## 2016-08-11 LAB — CBC
HCT: 29.8 % — ABNORMAL LOW (ref 39.0–52.0)
Hemoglobin: 10.2 g/dL — ABNORMAL LOW (ref 13.0–17.0)
MCH: 29.2 pg (ref 26.0–34.0)
MCHC: 34.2 g/dL (ref 30.0–36.0)
MCV: 85.4 fL (ref 78.0–100.0)
PLATELETS: 146 10*3/uL — AB (ref 150–400)
RBC: 3.49 MIL/uL — AB (ref 4.22–5.81)
RDW: 14.7 % (ref 11.5–15.5)
WBC: 6.4 10*3/uL (ref 4.0–10.5)

## 2016-08-11 NOTE — Progress Notes (Signed)
PT Cancellation Note  Patient Details Name: Lucas Calderon MRN: [id number] DOB: 15-Jan-1941   Cancelled Treatment:     Pt unavailable this am due to HD. Will attempt PT treatment tomorrow.   Lelon Mast 08/11/2016, 7:55 AM

## 2016-08-11 NOTE — Discharge Summary (Signed)
Physician Discharge Summary  Lucas Calderon EGB:[id number] DOB: 10/27/1940 DOA: 08/07/2016  PCP: Maryella Shivers, MD  Admit date: 08/07/2016 Discharge date: 08/11/2016  Time spent: > 35 minutes  Recommendations for Outpatient Follow-up:  1. Monitor blood pressures and adjust medication appropriately   Discharge Diagnoses:  Principal Problem:   Near syncope Active Problems:   HTN (hypertension)   Anemia in CKD (chronic kidney disease)   End stage renal disease (HCC)   Rhabdomyolysis   Discharge Condition: stable  Diet recommendation: renal diet  Filed Weights   08/10/16 0513 08/11/16 0750 08/11/16 1215  Weight: 69.7 kg (153 lb 9.6 oz) 68.7 kg (151 lb 7.3 oz) 68.1 kg (150 lb 2.1 oz)    History of present illness:   76 y.o.malewith ESRD on hemodialysis on Tuesdays Thursdays and Saturday was brought to the ER at [facility] after patient had a fall 2 days ago. Since the fall patient is feeling weak to walk. Patient states he lost balance and fell.   Hospital Course:  Principal Problem:   Near syncope - I suspect patient most likely has generalized weakness secondary to deconditioning. - PT recommending home health PT and rolling walker. Order placed  Active Problems:   HTN (hypertension) - continue prior to admission medication regimen.    Anemia in CKD (chronic kidney disease) - secondary to CKD, stable currently with no bleeding reported or seen on physical exam.    End stage renal disease [facility]) - consulted Nephrology while patient was in house.    Rhabdomyolysis - trending down.   Procedures:  HD  Consultations:  Nephrology  Discharge Exam: Vitals:   08/11/16 1100 08/11/16 1215  BP: (!) 141/62 (!) 152/63  Pulse: 74 77  Resp: 16 14  Temp:  98.3 F (36.8 C)    General: Pt in nad, alert and awake Cardiovascular: rrr, no rubs Respiratory: no increased wob, no wheezes  Discharge Instructions   Discharge Instructions    Call MD for:   extreme fatigue    Complete by:  As directed    Call MD for:  severe uncontrolled pain    Complete by:  As directed    Call MD for:  temperature >100.4    Complete by:  As directed    Diet - low sodium heart healthy    Complete by:  As directed    Discharge instructions    Complete by:  As directed    Please ensure patient has routine f/u with nephrology.   Increase activity slowly    Complete by:  As directed      Current Discharge Medication List    CONTINUE these medications which have NOT CHANGED   Details  albuterol (PROVENTIL HFA) 108 (90 BASE) MCG/ACT inhaler Inhale 2 puffs into the lungs every 6 (six) hours as needed for wheezing or shortness of breath.    amLODipine (NORVASC) 10 MG tablet Take 10 mg by mouth daily.    aspirin 81 MG tablet Take 81 mg by mouth daily.    B Complex-C-Folic Acid (VITA-BEE/C) TABS Take 1 tablet by mouth daily.    carvedilol (COREG) 12.5 MG tablet Take 12.5 mg by mouth 2 (two) times daily with a meal.    hydrALAZINE (APRESOLINE) 50 MG tablet Take 1.5 tablets (75 mg total) by mouth 3 (three) times daily. Qty: 120 tablet, Refills: 0    isosorbide mononitrate (IMDUR) 60 MG 24 hr tablet Take 60 mg by mouth daily.    multivitamin (RENA-VIT) TABS tablet Take 1 tablet  by mouth at bedtime. Qty: 30 tablet, Refills: 0    oxyCODONE-acetaminophen (ROXICET) 5-325 MG per tablet Take 1 tablet by mouth every 6 (six) hours as needed for severe pain. Qty: 20 tablet, Refills: 0    RENVELA 800 MG tablet Take 2,400 mg by mouth 3 (three) times daily.       Allergies  Allergen Reactions  . Penicillins     Unknown      The results of significant diagnostics from this hospitalization (including imaging, microbiology, ancillary and laboratory) are listed below for reference.    Significant Diagnostic Studies: Mr Brain 41 Contrast  Result Date: 08/08/2016 CLINICAL DATA:  76 y/o M; bilateral lower extremity weakness and confusion. EXAM: MRI HEAD  WITHOUT CONTRAST TECHNIQUE: Multiplanar, multiecho pulse sequences of the brain and surrounding structures were obtained without intravenous contrast. COMPARISON:  Concurrent MRI of the cervical spine. 08/07/2016 CT of the head and cervical spine. FINDINGS: Brain: No acute infarction, hemorrhage, hydrocephalus, extra-axial collection or mass lesion. Foci of T2 FLAIR hyperintense signal abnormality in subcortical and periventricular white matter are compatible with mild chronic microvascular ischemic changes. There is moderate brain parenchymal volume loss of both the supratentorial and infratentorial brain. Vascular: Normal flow voids. Skull and upper cervical spine: Normal marrow signal. Sinuses/Orbits: Negative. Other: None. IMPRESSION: 1. No acute intracranial abnormality is identified. 2. Mild chronic microvascular ischemic changes of the brain. 3. Moderate supratentorial and infratentorial brain parenchymal volume loss. Electronically Signed   By: Kristine Garbe M.D.   On: 08/08/2016 02:02   Mr Cervical Spine Wo Contrast  Result Date: 08/08/2016 CLINICAL DATA:  76 y/o M; bilateral lower extremity weakness and confusion. EXAM: MRI CERVICAL SPINE WITHOUT CONTRAST TECHNIQUE: Multiplanar, multisequence MR imaging of the cervical spine was performed. No intravenous contrast was administered. COMPARISON:  Concurrent MRI of the head. 08/07/2016 CT of the head and cervical spine. FINDINGS: Alignment: Physiologic. Vertebrae: No fracture, evidence of discitis, or bone lesion. Cord: Normal signal and morphology. Posterior Fossa, vertebral arteries, paraspinal tissues: 31 mm nodule within the left lobe of the thyroid gland. Disc levels: C2-3: No significant disc displacement, foraminal narrowing, or canal stenosis. C3-4: Mild right-sided uncovertebral and facet hypertrophy with mild right foraminal narrowing. No significant left foraminal narrowing or canal stenosis. C4-5: No significant disc displacement,  foraminal narrowing, or canal stenosis. C5-6: Small disc osteophyte complex and bilateral uncovertebral and facet hypertrophy. Mild bilateral foraminal narrowing. Effacement of the anterior thecal sac with anterior cord contact and slight anterior cord flattening. No significant canal stenosis. C6-7: Disc osteophyte complex eccentric to the right and right greater than left uncovertebral and facet hypertrophy. Severe right and moderate left foraminal narrowing. Mild canal stenosis and mild anterior cord flattening. C7-T1: No significant disc displacement, foraminal narrowing, or canal stenosis. IMPRESSION: 1. No acute osseous abnormality or abnormal cord signal. 2. Cervical degenerative changes are greatest at the C6-7 level where there is severe right/ moderate left foraminal narrowing and mild canal stenosis. Electronically Signed   By: Kristine Garbe M.D.   On: 08/08/2016 01:59   Dg Chest Port 1 View  Result Date: 08/07/2016 CLINICAL DATA:  Acute encephalopathy EXAM: PORTABLE CHEST 1 VIEW COMPARISON:  08/07/2016 FINDINGS: No acute infiltrate or E fusion. Minimal atelectasis at the lung bases. The heart is slightly enlarged. No edema. No pneumothorax. Atherosclerosis of the aorta. IMPRESSION: Slightly enlarged heart size. No acute infiltrate or edema. Mild basilar atelectasis. Electronically Signed   By: Donavan Foil M.D.   On: 08/07/2016 20:24  Microbiology: Recent Results (from the past 240 hour(s))  MRSA PCR Screening     Status: None   Collection Time: 08/07/16  6:19 PM  Result Value Ref Range Status   MRSA by PCR NEGATIVE NEGATIVE Final    Comment:        The GeneXpert MRSA Assay (FDA approved for NASAL specimens only), is one component of a comprehensive MRSA colonization surveillance program. It is not intended to diagnose MRSA infection nor to guide or monitor treatment for MRSA infections.      Labs: Basic Metabolic Panel:  Recent Labs Lab 08/07/16 2047  08/08/16 0422 08/08/16 1152 08/11/16 0827  NA 138 139 135 137  K 3.0* 3.3* 3.2* 2.9*  CL 97* 102 98* 101  CO2 24 24 25 22   GLUCOSE 106* 151* 195* 96  BUN 34* 41* 44* 44*  CREATININE 6.86* 7.19* 7.62* 8.90*  CALCIUM 7.9* 7.9* 8.0* 7.8*  PHOS  --   --  5.8* 4.2   Liver Function Tests:  Recent Labs Lab 08/07/16 2047 08/08/16 1152 08/11/16 0827  AST 77*  --   --   ALT 26  --   --   ALKPHOS 52  --   --   BILITOT 0.7  --   --   PROT 6.2*  --   --   ALBUMIN 2.8* 2.6* 2.7*   No results for input(s): LIPASE, AMYLASE in the last 168 hours.  Recent Labs Lab 08/07/16 2047  AMMONIA 25   CBC:  Recent Labs Lab 08/07/16 2047 08/08/16 0422 08/08/16 1152 08/11/16 0826  WBC 6.1 6.3 6.0 6.4  NEUTROABS 4.3  --   --   --   HGB 11.6* 10.1* 10.3* 10.2*  HCT 34.3* 30.4* 31.0* 29.8*  MCV 88.2 88.4 88.3 85.4  PLT 152 167 154 146*   Cardiac Enzymes:  Recent Labs Lab 08/07/16 2047 08/08/16 0422  CKTOTAL 2,938* 2,359*   BNP: BNP (last 3 results) No results for input(s): BNP in the last 8760 hours.  ProBNP (last 3 results) No results for input(s): PROBNP in the last 8760 hours.  CBG: No results for input(s): GLUCAP in the last 168 hours.  Signed:  Velvet Bathe MD.  [facility] 08/11/2016, 3:11 PM

## 2016-08-11 NOTE — Progress Notes (Signed)
Lucas Calderon to be D/C'd Home per MD order.  Discussed prescriptions and follow up appointments with the patient. Prescriptions given to patient, medication list explained in detail. Pt verbalized understanding.  Allergies as of 08/11/2016      Reactions   Penicillins    Unknown      Medication List    TAKE these medications   amLODipine 10 MG tablet Commonly known as:  NORVASC Take 10 mg by mouth daily.   aspirin 81 MG tablet Take 81 mg by mouth daily.   carvedilol 12.5 MG tablet Commonly known as:  COREG Take 12.5 mg by mouth 2 (two) times daily with a meal.   hydrALAZINE 50 MG tablet Commonly known as:  APRESOLINE Take 1.5 tablets (75 mg total) by mouth 3 (three) times daily.   isosorbide mononitrate 60 MG 24 hr tablet Commonly known as:  IMDUR Take 60 mg by mouth daily.   oxyCODONE-acetaminophen 5-325 MG tablet Commonly known as:  ROXICET Take 1 tablet by mouth every 6 (six) hours as needed for severe pain.   PROVENTIL HFA 108 (90 Base) MCG/ACT inhaler Generic drug:  albuterol Inhale 2 puffs into the lungs every 6 (six) hours as needed for wheezing or shortness of breath.   RENVELA 800 MG tablet Generic drug:  sevelamer carbonate Take 2,400 mg by mouth 3 (three) times daily.   VITA-BEE/C Tabs Take 1 tablet by mouth daily.   multivitamin Tabs tablet Take 1 tablet by mouth at bedtime.            Durable Medical Equipment        Start     Ordered   08/11/16 1506  For home use only DME Gilford Rile  [facility])  Once    Question:  Patient needs a walker to treat with the following condition  Answer:  Generalized weakness   08/11/16 1507      Vitals:   08/11/16 1100 08/11/16 1215  BP: (!) 141/62 (!) 152/63  Pulse: 74 77  Resp: 16 14  Temp:  98.3 F (36.8 C)    Skin clean, dry and intact without evidence of skin break down, no evidence of skin tears noted. IV catheter discontinued intact. Site without signs and symptoms of complications. Dressing and  pressure applied. Pt denies pain at this time. No complaints noted.  An After Visit Summary was printed and given to the patient. Patient escorted via [place], and D/C home via private auto.  Haywood Lasso BSN, RN [facility] Phone [phone]

## 2016-08-11 NOTE — Care Management Note (Signed)
Case Management Note  Patient Details  Name: Esdras Delair MRN: [id number] Date of Birth: [birth year]  Subjective/Objective:          CM following for progression and d/c planning.           Action/Plan: 08/11/16 Met with pt and walker ordered , pt anxious to go home. Asking about a ride home, CSW ,  [place] notified.  Expected Discharge Date:  08/11/16               Expected Discharge Plan:  Home/Self Care  In-House Referral:  Clinical Social Work  Discharge planning Services  CM Consult  Post Acute Care Choice:  Durable Medical Equipment Choice offered to:  Patient  DME Arranged:  Gilford Rile rolling DME Agency:  [place]:  NA Real Agency:  NA  Status of Service:  Completed, signed off  If discussed at [place] of Stay Meetings, dates discussed:    Additional Comments:  Adron Bene, RN 08/11/2016, 3:12 PM

## 2016-08-13 DIAGNOSIS — N2581 Secondary hyperparathyroidism of renal origin: Secondary | ICD-10-CM | POA: Diagnosis not present

## 2016-08-13 DIAGNOSIS — D631 Anemia in chronic kidney disease: Secondary | ICD-10-CM | POA: Diagnosis not present

## 2016-08-13 DIAGNOSIS — N186 End stage renal disease: Secondary | ICD-10-CM | POA: Diagnosis not present

## 2016-08-13 DIAGNOSIS — E119 Type 2 diabetes mellitus without complications: Secondary | ICD-10-CM | POA: Diagnosis not present

## 2016-08-15 DIAGNOSIS — E119 Type 2 diabetes mellitus without complications: Secondary | ICD-10-CM | POA: Diagnosis not present

## 2016-08-15 DIAGNOSIS — N2581 Secondary hyperparathyroidism of renal origin: Secondary | ICD-10-CM | POA: Diagnosis not present

## 2016-08-15 DIAGNOSIS — D631 Anemia in chronic kidney disease: Secondary | ICD-10-CM | POA: Diagnosis not present

## 2016-08-15 DIAGNOSIS — N186 End stage renal disease: Secondary | ICD-10-CM | POA: Diagnosis not present

## 2016-08-18 DIAGNOSIS — N2581 Secondary hyperparathyroidism of renal origin: Secondary | ICD-10-CM | POA: Diagnosis not present

## 2016-08-18 DIAGNOSIS — N186 End stage renal disease: Secondary | ICD-10-CM | POA: Diagnosis not present

## 2016-08-18 DIAGNOSIS — E119 Type 2 diabetes mellitus without complications: Secondary | ICD-10-CM | POA: Diagnosis not present

## 2016-08-18 DIAGNOSIS — D631 Anemia in chronic kidney disease: Secondary | ICD-10-CM | POA: Diagnosis not present

## 2016-08-19 DIAGNOSIS — Z992 Dependence on renal dialysis: Secondary | ICD-10-CM | POA: Diagnosis not present

## 2016-08-19 DIAGNOSIS — I120 Hypertensive chronic kidney disease with stage 5 chronic kidney disease or end stage renal disease: Secondary | ICD-10-CM | POA: Diagnosis not present

## 2016-08-19 DIAGNOSIS — N186 End stage renal disease: Secondary | ICD-10-CM | POA: Diagnosis not present

## 2016-08-20 DIAGNOSIS — N186 End stage renal disease: Secondary | ICD-10-CM | POA: Diagnosis not present

## 2016-08-20 DIAGNOSIS — E876 Hypokalemia: Secondary | ICD-10-CM | POA: Diagnosis not present

## 2016-08-20 DIAGNOSIS — E119 Type 2 diabetes mellitus without complications: Secondary | ICD-10-CM | POA: Diagnosis not present

## 2016-08-20 DIAGNOSIS — D631 Anemia in chronic kidney disease: Secondary | ICD-10-CM | POA: Diagnosis not present

## 2016-08-20 DIAGNOSIS — N2581 Secondary hyperparathyroidism of renal origin: Secondary | ICD-10-CM | POA: Diagnosis not present

## 2016-08-21 DIAGNOSIS — E1129 Type 2 diabetes mellitus with other diabetic kidney complication: Secondary | ICD-10-CM | POA: Diagnosis not present

## 2016-08-21 DIAGNOSIS — E1165 Type 2 diabetes mellitus with hyperglycemia: Secondary | ICD-10-CM | POA: Diagnosis not present

## 2016-08-21 DIAGNOSIS — E1159 Type 2 diabetes mellitus with other circulatory complications: Secondary | ICD-10-CM | POA: Diagnosis not present

## 2016-08-21 DIAGNOSIS — I10 Essential (primary) hypertension: Secondary | ICD-10-CM | POA: Diagnosis not present

## 2016-08-22 DIAGNOSIS — N186 End stage renal disease: Secondary | ICD-10-CM | POA: Diagnosis not present

## 2016-08-22 DIAGNOSIS — N2581 Secondary hyperparathyroidism of renal origin: Secondary | ICD-10-CM | POA: Diagnosis not present

## 2016-08-22 DIAGNOSIS — E119 Type 2 diabetes mellitus without complications: Secondary | ICD-10-CM | POA: Diagnosis not present

## 2016-08-22 DIAGNOSIS — D631 Anemia in chronic kidney disease: Secondary | ICD-10-CM | POA: Diagnosis not present

## 2016-08-22 DIAGNOSIS — E876 Hypokalemia: Secondary | ICD-10-CM | POA: Diagnosis not present

## 2016-08-25 DIAGNOSIS — D631 Anemia in chronic kidney disease: Secondary | ICD-10-CM | POA: Diagnosis not present

## 2016-08-25 DIAGNOSIS — N186 End stage renal disease: Secondary | ICD-10-CM | POA: Diagnosis not present

## 2016-08-25 DIAGNOSIS — N2581 Secondary hyperparathyroidism of renal origin: Secondary | ICD-10-CM | POA: Diagnosis not present

## 2016-08-25 DIAGNOSIS — E119 Type 2 diabetes mellitus without complications: Secondary | ICD-10-CM | POA: Diagnosis not present

## 2016-08-25 DIAGNOSIS — E876 Hypokalemia: Secondary | ICD-10-CM | POA: Diagnosis not present

## 2016-08-27 DIAGNOSIS — D631 Anemia in chronic kidney disease: Secondary | ICD-10-CM | POA: Diagnosis not present

## 2016-08-27 DIAGNOSIS — E119 Type 2 diabetes mellitus without complications: Secondary | ICD-10-CM | POA: Diagnosis not present

## 2016-08-27 DIAGNOSIS — E876 Hypokalemia: Secondary | ICD-10-CM | POA: Diagnosis not present

## 2016-08-27 DIAGNOSIS — N186 End stage renal disease: Secondary | ICD-10-CM | POA: Diagnosis not present

## 2016-08-27 DIAGNOSIS — N2581 Secondary hyperparathyroidism of renal origin: Secondary | ICD-10-CM | POA: Diagnosis not present

## 2016-08-29 DIAGNOSIS — E119 Type 2 diabetes mellitus without complications: Secondary | ICD-10-CM | POA: Diagnosis not present

## 2016-08-29 DIAGNOSIS — N2581 Secondary hyperparathyroidism of renal origin: Secondary | ICD-10-CM | POA: Diagnosis not present

## 2016-08-29 DIAGNOSIS — N186 End stage renal disease: Secondary | ICD-10-CM | POA: Diagnosis not present

## 2016-08-29 DIAGNOSIS — D631 Anemia in chronic kidney disease: Secondary | ICD-10-CM | POA: Diagnosis not present

## 2016-08-29 DIAGNOSIS — E876 Hypokalemia: Secondary | ICD-10-CM | POA: Diagnosis not present

## 2016-09-01 DIAGNOSIS — E876 Hypokalemia: Secondary | ICD-10-CM | POA: Diagnosis not present

## 2016-09-01 DIAGNOSIS — E119 Type 2 diabetes mellitus without complications: Secondary | ICD-10-CM | POA: Diagnosis not present

## 2016-09-01 DIAGNOSIS — N186 End stage renal disease: Secondary | ICD-10-CM | POA: Diagnosis not present

## 2016-09-01 DIAGNOSIS — N2581 Secondary hyperparathyroidism of renal origin: Secondary | ICD-10-CM | POA: Diagnosis not present

## 2016-09-01 DIAGNOSIS — D631 Anemia in chronic kidney disease: Secondary | ICD-10-CM | POA: Diagnosis not present

## 2016-09-03 DIAGNOSIS — N186 End stage renal disease: Secondary | ICD-10-CM | POA: Diagnosis not present

## 2016-09-03 DIAGNOSIS — N2581 Secondary hyperparathyroidism of renal origin: Secondary | ICD-10-CM | POA: Diagnosis not present

## 2016-09-03 DIAGNOSIS — E119 Type 2 diabetes mellitus without complications: Secondary | ICD-10-CM | POA: Diagnosis not present

## 2016-09-03 DIAGNOSIS — E876 Hypokalemia: Secondary | ICD-10-CM | POA: Diagnosis not present

## 2016-09-03 DIAGNOSIS — D631 Anemia in chronic kidney disease: Secondary | ICD-10-CM | POA: Diagnosis not present

## 2016-09-05 DIAGNOSIS — D631 Anemia in chronic kidney disease: Secondary | ICD-10-CM | POA: Diagnosis not present

## 2016-09-05 DIAGNOSIS — N2581 Secondary hyperparathyroidism of renal origin: Secondary | ICD-10-CM | POA: Diagnosis not present

## 2016-09-05 DIAGNOSIS — E119 Type 2 diabetes mellitus without complications: Secondary | ICD-10-CM | POA: Diagnosis not present

## 2016-09-05 DIAGNOSIS — E876 Hypokalemia: Secondary | ICD-10-CM | POA: Diagnosis not present

## 2016-09-05 DIAGNOSIS — N186 End stage renal disease: Secondary | ICD-10-CM | POA: Diagnosis not present

## 2016-09-08 DIAGNOSIS — N2581 Secondary hyperparathyroidism of renal origin: Secondary | ICD-10-CM | POA: Diagnosis not present

## 2016-09-08 DIAGNOSIS — N186 End stage renal disease: Secondary | ICD-10-CM | POA: Diagnosis not present

## 2016-09-08 DIAGNOSIS — E119 Type 2 diabetes mellitus without complications: Secondary | ICD-10-CM | POA: Diagnosis not present

## 2016-09-08 DIAGNOSIS — E876 Hypokalemia: Secondary | ICD-10-CM | POA: Diagnosis not present

## 2016-09-08 DIAGNOSIS — D631 Anemia in chronic kidney disease: Secondary | ICD-10-CM | POA: Diagnosis not present

## 2016-09-10 DIAGNOSIS — E119 Type 2 diabetes mellitus without complications: Secondary | ICD-10-CM | POA: Diagnosis not present

## 2016-09-10 DIAGNOSIS — D631 Anemia in chronic kidney disease: Secondary | ICD-10-CM | POA: Diagnosis not present

## 2016-09-10 DIAGNOSIS — N186 End stage renal disease: Secondary | ICD-10-CM | POA: Diagnosis not present

## 2016-09-10 DIAGNOSIS — N2581 Secondary hyperparathyroidism of renal origin: Secondary | ICD-10-CM | POA: Diagnosis not present

## 2016-09-10 DIAGNOSIS — E876 Hypokalemia: Secondary | ICD-10-CM | POA: Diagnosis not present

## 2016-09-12 DIAGNOSIS — N186 End stage renal disease: Secondary | ICD-10-CM | POA: Diagnosis not present

## 2016-09-12 DIAGNOSIS — E876 Hypokalemia: Secondary | ICD-10-CM | POA: Diagnosis not present

## 2016-09-12 DIAGNOSIS — D631 Anemia in chronic kidney disease: Secondary | ICD-10-CM | POA: Diagnosis not present

## 2016-09-12 DIAGNOSIS — E119 Type 2 diabetes mellitus without complications: Secondary | ICD-10-CM | POA: Diagnosis not present

## 2016-09-12 DIAGNOSIS — N2581 Secondary hyperparathyroidism of renal origin: Secondary | ICD-10-CM | POA: Diagnosis not present

## 2016-09-15 DIAGNOSIS — N2581 Secondary hyperparathyroidism of renal origin: Secondary | ICD-10-CM | POA: Diagnosis not present

## 2016-09-15 DIAGNOSIS — E876 Hypokalemia: Secondary | ICD-10-CM | POA: Diagnosis not present

## 2016-09-15 DIAGNOSIS — D631 Anemia in chronic kidney disease: Secondary | ICD-10-CM | POA: Diagnosis not present

## 2016-09-15 DIAGNOSIS — N186 End stage renal disease: Secondary | ICD-10-CM | POA: Diagnosis not present

## 2016-09-15 DIAGNOSIS — E119 Type 2 diabetes mellitus without complications: Secondary | ICD-10-CM | POA: Diagnosis not present

## 2016-09-16 DIAGNOSIS — Z992 Dependence on renal dialysis: Secondary | ICD-10-CM | POA: Diagnosis not present

## 2016-09-16 DIAGNOSIS — E1129 Type 2 diabetes mellitus with other diabetic kidney complication: Secondary | ICD-10-CM | POA: Diagnosis not present

## 2016-09-16 DIAGNOSIS — N186 End stage renal disease: Secondary | ICD-10-CM | POA: Diagnosis not present

## 2016-09-17 DIAGNOSIS — N186 End stage renal disease: Secondary | ICD-10-CM | POA: Diagnosis not present

## 2016-09-17 DIAGNOSIS — E119 Type 2 diabetes mellitus without complications: Secondary | ICD-10-CM | POA: Diagnosis not present

## 2016-09-17 DIAGNOSIS — D631 Anemia in chronic kidney disease: Secondary | ICD-10-CM | POA: Diagnosis not present

## 2016-09-17 DIAGNOSIS — Z23 Encounter for immunization: Secondary | ICD-10-CM | POA: Diagnosis not present

## 2016-09-17 DIAGNOSIS — E876 Hypokalemia: Secondary | ICD-10-CM | POA: Diagnosis not present

## 2016-09-17 DIAGNOSIS — N2581 Secondary hyperparathyroidism of renal origin: Secondary | ICD-10-CM | POA: Diagnosis not present

## 2016-09-19 DIAGNOSIS — E119 Type 2 diabetes mellitus without complications: Secondary | ICD-10-CM | POA: Diagnosis not present

## 2016-09-19 DIAGNOSIS — N186 End stage renal disease: Secondary | ICD-10-CM | POA: Diagnosis not present

## 2016-09-19 DIAGNOSIS — N2581 Secondary hyperparathyroidism of renal origin: Secondary | ICD-10-CM | POA: Diagnosis not present

## 2016-09-19 DIAGNOSIS — Z23 Encounter for immunization: Secondary | ICD-10-CM | POA: Diagnosis not present

## 2016-09-19 DIAGNOSIS — E876 Hypokalemia: Secondary | ICD-10-CM | POA: Diagnosis not present

## 2016-09-19 DIAGNOSIS — D631 Anemia in chronic kidney disease: Secondary | ICD-10-CM | POA: Diagnosis not present

## 2016-09-22 DIAGNOSIS — N186 End stage renal disease: Secondary | ICD-10-CM | POA: Diagnosis not present

## 2016-09-22 DIAGNOSIS — E119 Type 2 diabetes mellitus without complications: Secondary | ICD-10-CM | POA: Diagnosis not present

## 2016-09-22 DIAGNOSIS — Z23 Encounter for immunization: Secondary | ICD-10-CM | POA: Diagnosis not present

## 2016-09-22 DIAGNOSIS — N2581 Secondary hyperparathyroidism of renal origin: Secondary | ICD-10-CM | POA: Diagnosis not present

## 2016-09-22 DIAGNOSIS — E876 Hypokalemia: Secondary | ICD-10-CM | POA: Diagnosis not present

## 2016-09-22 DIAGNOSIS — D631 Anemia in chronic kidney disease: Secondary | ICD-10-CM | POA: Diagnosis not present

## 2016-09-24 DIAGNOSIS — D631 Anemia in chronic kidney disease: Secondary | ICD-10-CM | POA: Diagnosis not present

## 2016-09-24 DIAGNOSIS — E876 Hypokalemia: Secondary | ICD-10-CM | POA: Diagnosis not present

## 2016-09-24 DIAGNOSIS — N186 End stage renal disease: Secondary | ICD-10-CM | POA: Diagnosis not present

## 2016-09-24 DIAGNOSIS — N2581 Secondary hyperparathyroidism of renal origin: Secondary | ICD-10-CM | POA: Diagnosis not present

## 2016-09-24 DIAGNOSIS — E119 Type 2 diabetes mellitus without complications: Secondary | ICD-10-CM | POA: Diagnosis not present

## 2016-09-24 DIAGNOSIS — Z23 Encounter for immunization: Secondary | ICD-10-CM | POA: Diagnosis not present

## 2016-09-26 DIAGNOSIS — E119 Type 2 diabetes mellitus without complications: Secondary | ICD-10-CM | POA: Diagnosis not present

## 2016-09-26 DIAGNOSIS — Z23 Encounter for immunization: Secondary | ICD-10-CM | POA: Diagnosis not present

## 2016-09-26 DIAGNOSIS — N186 End stage renal disease: Secondary | ICD-10-CM | POA: Diagnosis not present

## 2016-09-26 DIAGNOSIS — N2581 Secondary hyperparathyroidism of renal origin: Secondary | ICD-10-CM | POA: Diagnosis not present

## 2016-09-26 DIAGNOSIS — D631 Anemia in chronic kidney disease: Secondary | ICD-10-CM | POA: Diagnosis not present

## 2016-09-26 DIAGNOSIS — E876 Hypokalemia: Secondary | ICD-10-CM | POA: Diagnosis not present

## 2016-09-29 DIAGNOSIS — Z23 Encounter for immunization: Secondary | ICD-10-CM | POA: Diagnosis not present

## 2016-09-29 DIAGNOSIS — E119 Type 2 diabetes mellitus without complications: Secondary | ICD-10-CM | POA: Diagnosis not present

## 2016-09-29 DIAGNOSIS — E876 Hypokalemia: Secondary | ICD-10-CM | POA: Diagnosis not present

## 2016-09-29 DIAGNOSIS — N2581 Secondary hyperparathyroidism of renal origin: Secondary | ICD-10-CM | POA: Diagnosis not present

## 2016-09-29 DIAGNOSIS — N186 End stage renal disease: Secondary | ICD-10-CM | POA: Diagnosis not present

## 2016-09-29 DIAGNOSIS — D631 Anemia in chronic kidney disease: Secondary | ICD-10-CM | POA: Diagnosis not present

## 2016-10-01 DIAGNOSIS — E119 Type 2 diabetes mellitus without complications: Secondary | ICD-10-CM | POA: Diagnosis not present

## 2016-10-01 DIAGNOSIS — N2581 Secondary hyperparathyroidism of renal origin: Secondary | ICD-10-CM | POA: Diagnosis not present

## 2016-10-01 DIAGNOSIS — E876 Hypokalemia: Secondary | ICD-10-CM | POA: Diagnosis not present

## 2016-10-01 DIAGNOSIS — Z23 Encounter for immunization: Secondary | ICD-10-CM | POA: Diagnosis not present

## 2016-10-01 DIAGNOSIS — D631 Anemia in chronic kidney disease: Secondary | ICD-10-CM | POA: Diagnosis not present

## 2016-10-01 DIAGNOSIS — N186 End stage renal disease: Secondary | ICD-10-CM | POA: Diagnosis not present

## 2016-10-03 DIAGNOSIS — N2581 Secondary hyperparathyroidism of renal origin: Secondary | ICD-10-CM | POA: Diagnosis not present

## 2016-10-03 DIAGNOSIS — D631 Anemia in chronic kidney disease: Secondary | ICD-10-CM | POA: Diagnosis not present

## 2016-10-03 DIAGNOSIS — N186 End stage renal disease: Secondary | ICD-10-CM | POA: Diagnosis not present

## 2016-10-03 DIAGNOSIS — E119 Type 2 diabetes mellitus without complications: Secondary | ICD-10-CM | POA: Diagnosis not present

## 2016-10-03 DIAGNOSIS — Z23 Encounter for immunization: Secondary | ICD-10-CM | POA: Diagnosis not present

## 2016-10-03 DIAGNOSIS — E876 Hypokalemia: Secondary | ICD-10-CM | POA: Diagnosis not present

## 2016-10-06 DIAGNOSIS — N186 End stage renal disease: Secondary | ICD-10-CM | POA: Diagnosis not present

## 2016-10-06 DIAGNOSIS — E119 Type 2 diabetes mellitus without complications: Secondary | ICD-10-CM | POA: Diagnosis not present

## 2016-10-06 DIAGNOSIS — D631 Anemia in chronic kidney disease: Secondary | ICD-10-CM | POA: Diagnosis not present

## 2016-10-06 DIAGNOSIS — E876 Hypokalemia: Secondary | ICD-10-CM | POA: Diagnosis not present

## 2016-10-06 DIAGNOSIS — Z23 Encounter for immunization: Secondary | ICD-10-CM | POA: Diagnosis not present

## 2016-10-06 DIAGNOSIS — N2581 Secondary hyperparathyroidism of renal origin: Secondary | ICD-10-CM | POA: Diagnosis not present

## 2016-10-08 DIAGNOSIS — Z23 Encounter for immunization: Secondary | ICD-10-CM | POA: Diagnosis not present

## 2016-10-08 DIAGNOSIS — E876 Hypokalemia: Secondary | ICD-10-CM | POA: Diagnosis not present

## 2016-10-08 DIAGNOSIS — D631 Anemia in chronic kidney disease: Secondary | ICD-10-CM | POA: Diagnosis not present

## 2016-10-08 DIAGNOSIS — N186 End stage renal disease: Secondary | ICD-10-CM | POA: Diagnosis not present

## 2016-10-08 DIAGNOSIS — E119 Type 2 diabetes mellitus without complications: Secondary | ICD-10-CM | POA: Diagnosis not present

## 2016-10-08 DIAGNOSIS — N2581 Secondary hyperparathyroidism of renal origin: Secondary | ICD-10-CM | POA: Diagnosis not present

## 2016-10-10 DIAGNOSIS — N186 End stage renal disease: Secondary | ICD-10-CM | POA: Diagnosis not present

## 2016-10-10 DIAGNOSIS — E119 Type 2 diabetes mellitus without complications: Secondary | ICD-10-CM | POA: Diagnosis not present

## 2016-10-10 DIAGNOSIS — N2581 Secondary hyperparathyroidism of renal origin: Secondary | ICD-10-CM | POA: Diagnosis not present

## 2016-10-10 DIAGNOSIS — Z23 Encounter for immunization: Secondary | ICD-10-CM | POA: Diagnosis not present

## 2016-10-10 DIAGNOSIS — D631 Anemia in chronic kidney disease: Secondary | ICD-10-CM | POA: Diagnosis not present

## 2016-10-10 DIAGNOSIS — E876 Hypokalemia: Secondary | ICD-10-CM | POA: Diagnosis not present

## 2016-10-13 DIAGNOSIS — E119 Type 2 diabetes mellitus without complications: Secondary | ICD-10-CM | POA: Diagnosis not present

## 2016-10-13 DIAGNOSIS — N186 End stage renal disease: Secondary | ICD-10-CM | POA: Diagnosis not present

## 2016-10-13 DIAGNOSIS — E876 Hypokalemia: Secondary | ICD-10-CM | POA: Diagnosis not present

## 2016-10-13 DIAGNOSIS — D631 Anemia in chronic kidney disease: Secondary | ICD-10-CM | POA: Diagnosis not present

## 2016-10-13 DIAGNOSIS — N2581 Secondary hyperparathyroidism of renal origin: Secondary | ICD-10-CM | POA: Diagnosis not present

## 2016-10-13 DIAGNOSIS — Z23 Encounter for immunization: Secondary | ICD-10-CM | POA: Diagnosis not present

## 2016-10-15 DIAGNOSIS — N186 End stage renal disease: Secondary | ICD-10-CM | POA: Diagnosis not present

## 2016-10-15 DIAGNOSIS — Z23 Encounter for immunization: Secondary | ICD-10-CM | POA: Diagnosis not present

## 2016-10-15 DIAGNOSIS — D631 Anemia in chronic kidney disease: Secondary | ICD-10-CM | POA: Diagnosis not present

## 2016-10-15 DIAGNOSIS — E876 Hypokalemia: Secondary | ICD-10-CM | POA: Diagnosis not present

## 2016-10-15 DIAGNOSIS — E119 Type 2 diabetes mellitus without complications: Secondary | ICD-10-CM | POA: Diagnosis not present

## 2016-10-15 DIAGNOSIS — N2581 Secondary hyperparathyroidism of renal origin: Secondary | ICD-10-CM | POA: Diagnosis not present

## 2016-10-17 DIAGNOSIS — E876 Hypokalemia: Secondary | ICD-10-CM | POA: Diagnosis not present

## 2016-10-17 DIAGNOSIS — N2581 Secondary hyperparathyroidism of renal origin: Secondary | ICD-10-CM | POA: Diagnosis not present

## 2016-10-17 DIAGNOSIS — D631 Anemia in chronic kidney disease: Secondary | ICD-10-CM | POA: Diagnosis not present

## 2016-10-17 DIAGNOSIS — N186 End stage renal disease: Secondary | ICD-10-CM | POA: Diagnosis not present

## 2016-10-17 DIAGNOSIS — E1129 Type 2 diabetes mellitus with other diabetic kidney complication: Secondary | ICD-10-CM | POA: Diagnosis not present

## 2016-10-17 DIAGNOSIS — Z23 Encounter for immunization: Secondary | ICD-10-CM | POA: Diagnosis not present

## 2016-10-17 DIAGNOSIS — E119 Type 2 diabetes mellitus without complications: Secondary | ICD-10-CM | POA: Diagnosis not present

## 2016-10-17 DIAGNOSIS — Z992 Dependence on renal dialysis: Secondary | ICD-10-CM | POA: Diagnosis not present

## 2016-10-20 DIAGNOSIS — N186 End stage renal disease: Secondary | ICD-10-CM | POA: Diagnosis not present

## 2016-10-20 DIAGNOSIS — N2581 Secondary hyperparathyroidism of renal origin: Secondary | ICD-10-CM | POA: Diagnosis not present

## 2016-10-20 DIAGNOSIS — E876 Hypokalemia: Secondary | ICD-10-CM | POA: Diagnosis not present

## 2016-10-20 DIAGNOSIS — E119 Type 2 diabetes mellitus without complications: Secondary | ICD-10-CM | POA: Diagnosis not present

## 2016-10-22 DIAGNOSIS — E876 Hypokalemia: Secondary | ICD-10-CM | POA: Diagnosis not present

## 2016-10-22 DIAGNOSIS — N2581 Secondary hyperparathyroidism of renal origin: Secondary | ICD-10-CM | POA: Diagnosis not present

## 2016-10-22 DIAGNOSIS — E119 Type 2 diabetes mellitus without complications: Secondary | ICD-10-CM | POA: Diagnosis not present

## 2016-10-22 DIAGNOSIS — N186 End stage renal disease: Secondary | ICD-10-CM | POA: Diagnosis not present

## 2016-10-24 DIAGNOSIS — E876 Hypokalemia: Secondary | ICD-10-CM | POA: Diagnosis not present

## 2016-10-24 DIAGNOSIS — N2581 Secondary hyperparathyroidism of renal origin: Secondary | ICD-10-CM | POA: Diagnosis not present

## 2016-10-24 DIAGNOSIS — N186 End stage renal disease: Secondary | ICD-10-CM | POA: Diagnosis not present

## 2016-10-24 DIAGNOSIS — E119 Type 2 diabetes mellitus without complications: Secondary | ICD-10-CM | POA: Diagnosis not present

## 2016-10-27 DIAGNOSIS — N2581 Secondary hyperparathyroidism of renal origin: Secondary | ICD-10-CM | POA: Diagnosis not present

## 2016-10-27 DIAGNOSIS — N186 End stage renal disease: Secondary | ICD-10-CM | POA: Diagnosis not present

## 2016-10-27 DIAGNOSIS — E876 Hypokalemia: Secondary | ICD-10-CM | POA: Diagnosis not present

## 2016-10-27 DIAGNOSIS — E119 Type 2 diabetes mellitus without complications: Secondary | ICD-10-CM | POA: Diagnosis not present

## 2016-10-29 DIAGNOSIS — N2581 Secondary hyperparathyroidism of renal origin: Secondary | ICD-10-CM | POA: Diagnosis not present

## 2016-10-29 DIAGNOSIS — E119 Type 2 diabetes mellitus without complications: Secondary | ICD-10-CM | POA: Diagnosis not present

## 2016-10-29 DIAGNOSIS — N186 End stage renal disease: Secondary | ICD-10-CM | POA: Diagnosis not present

## 2016-10-29 DIAGNOSIS — E876 Hypokalemia: Secondary | ICD-10-CM | POA: Diagnosis not present

## 2016-10-31 DIAGNOSIS — N2581 Secondary hyperparathyroidism of renal origin: Secondary | ICD-10-CM | POA: Diagnosis not present

## 2016-10-31 DIAGNOSIS — E876 Hypokalemia: Secondary | ICD-10-CM | POA: Diagnosis not present

## 2016-10-31 DIAGNOSIS — N186 End stage renal disease: Secondary | ICD-10-CM | POA: Diagnosis not present

## 2016-10-31 DIAGNOSIS — E119 Type 2 diabetes mellitus without complications: Secondary | ICD-10-CM | POA: Diagnosis not present

## 2016-11-03 DIAGNOSIS — E119 Type 2 diabetes mellitus without complications: Secondary | ICD-10-CM | POA: Diagnosis not present

## 2016-11-03 DIAGNOSIS — N2581 Secondary hyperparathyroidism of renal origin: Secondary | ICD-10-CM | POA: Diagnosis not present

## 2016-11-03 DIAGNOSIS — E876 Hypokalemia: Secondary | ICD-10-CM | POA: Diagnosis not present

## 2016-11-03 DIAGNOSIS — N186 End stage renal disease: Secondary | ICD-10-CM | POA: Diagnosis not present

## 2016-11-05 DIAGNOSIS — E119 Type 2 diabetes mellitus without complications: Secondary | ICD-10-CM | POA: Diagnosis not present

## 2016-11-05 DIAGNOSIS — E876 Hypokalemia: Secondary | ICD-10-CM | POA: Diagnosis not present

## 2016-11-05 DIAGNOSIS — N2581 Secondary hyperparathyroidism of renal origin: Secondary | ICD-10-CM | POA: Diagnosis not present

## 2016-11-05 DIAGNOSIS — N186 End stage renal disease: Secondary | ICD-10-CM | POA: Diagnosis not present

## 2016-11-07 DIAGNOSIS — N186 End stage renal disease: Secondary | ICD-10-CM | POA: Diagnosis not present

## 2016-11-07 DIAGNOSIS — E876 Hypokalemia: Secondary | ICD-10-CM | POA: Diagnosis not present

## 2016-11-07 DIAGNOSIS — N2581 Secondary hyperparathyroidism of renal origin: Secondary | ICD-10-CM | POA: Diagnosis not present

## 2016-11-07 DIAGNOSIS — E119 Type 2 diabetes mellitus without complications: Secondary | ICD-10-CM | POA: Diagnosis not present

## 2016-11-10 DIAGNOSIS — E119 Type 2 diabetes mellitus without complications: Secondary | ICD-10-CM | POA: Diagnosis not present

## 2016-11-10 DIAGNOSIS — N2581 Secondary hyperparathyroidism of renal origin: Secondary | ICD-10-CM | POA: Diagnosis not present

## 2016-11-10 DIAGNOSIS — N186 End stage renal disease: Secondary | ICD-10-CM | POA: Diagnosis not present

## 2016-11-10 DIAGNOSIS — E876 Hypokalemia: Secondary | ICD-10-CM | POA: Diagnosis not present

## 2016-11-12 DIAGNOSIS — E876 Hypokalemia: Secondary | ICD-10-CM | POA: Diagnosis not present

## 2016-11-12 DIAGNOSIS — N186 End stage renal disease: Secondary | ICD-10-CM | POA: Diagnosis not present

## 2016-11-12 DIAGNOSIS — E119 Type 2 diabetes mellitus without complications: Secondary | ICD-10-CM | POA: Diagnosis not present

## 2016-11-12 DIAGNOSIS — N2581 Secondary hyperparathyroidism of renal origin: Secondary | ICD-10-CM | POA: Diagnosis not present

## 2016-11-14 DIAGNOSIS — N186 End stage renal disease: Secondary | ICD-10-CM | POA: Diagnosis not present

## 2016-11-14 DIAGNOSIS — E119 Type 2 diabetes mellitus without complications: Secondary | ICD-10-CM | POA: Diagnosis not present

## 2016-11-14 DIAGNOSIS — N2581 Secondary hyperparathyroidism of renal origin: Secondary | ICD-10-CM | POA: Diagnosis not present

## 2016-11-14 DIAGNOSIS — E876 Hypokalemia: Secondary | ICD-10-CM | POA: Diagnosis not present

## 2016-11-16 DIAGNOSIS — N186 End stage renal disease: Secondary | ICD-10-CM | POA: Diagnosis not present

## 2016-11-16 DIAGNOSIS — Z992 Dependence on renal dialysis: Secondary | ICD-10-CM | POA: Diagnosis not present

## 2016-11-16 DIAGNOSIS — E1129 Type 2 diabetes mellitus with other diabetic kidney complication: Secondary | ICD-10-CM | POA: Diagnosis not present

## 2016-12-17 DIAGNOSIS — E1129 Type 2 diabetes mellitus with other diabetic kidney complication: Secondary | ICD-10-CM | POA: Diagnosis not present

## 2016-12-17 DIAGNOSIS — N186 End stage renal disease: Secondary | ICD-10-CM | POA: Diagnosis not present

## 2016-12-17 DIAGNOSIS — Z992 Dependence on renal dialysis: Secondary | ICD-10-CM | POA: Diagnosis not present

## 2017-01-13 DIAGNOSIS — R0789 Other chest pain: Secondary | ICD-10-CM | POA: Diagnosis not present

## 2017-11-17 DIAGNOSIS — E1129 Type 2 diabetes mellitus with other diabetic kidney complication: Secondary | ICD-10-CM | POA: Diagnosis not present

## 2017-11-17 DIAGNOSIS — I5040 Unspecified combined systolic (congestive) and diastolic (congestive) heart failure: Secondary | ICD-10-CM | POA: Diagnosis not present

## 2017-11-17 DIAGNOSIS — Z992 Dependence on renal dialysis: Secondary | ICD-10-CM | POA: Diagnosis not present

## 2017-11-17 DIAGNOSIS — E1142 Type 2 diabetes mellitus with diabetic polyneuropathy: Secondary | ICD-10-CM | POA: Diagnosis not present

## 2017-11-17 DIAGNOSIS — N2581 Secondary hyperparathyroidism of renal origin: Secondary | ICD-10-CM | POA: Diagnosis not present

## 2017-11-17 DIAGNOSIS — I120 Hypertensive chronic kidney disease with stage 5 chronic kidney disease or end stage renal disease: Secondary | ICD-10-CM | POA: Diagnosis not present

## 2017-11-17 DIAGNOSIS — N186 End stage renal disease: Secondary | ICD-10-CM | POA: Diagnosis not present

## 2017-11-17 DIAGNOSIS — E119 Type 2 diabetes mellitus without complications: Secondary | ICD-10-CM | POA: Diagnosis not present

## 2017-11-17 DIAGNOSIS — D509 Iron deficiency anemia, unspecified: Secondary | ICD-10-CM | POA: Diagnosis not present

## 2017-11-18 DIAGNOSIS — I10 Essential (primary) hypertension: Secondary | ICD-10-CM | POA: Diagnosis not present

## 2017-11-18 DIAGNOSIS — K219 Gastro-esophageal reflux disease without esophagitis: Secondary | ICD-10-CM | POA: Diagnosis not present

## 2017-11-18 DIAGNOSIS — I5040 Unspecified combined systolic (congestive) and diastolic (congestive) heart failure: Secondary | ICD-10-CM | POA: Diagnosis not present

## 2017-11-18 DIAGNOSIS — E1142 Type 2 diabetes mellitus with diabetic polyneuropathy: Secondary | ICD-10-CM | POA: Diagnosis not present

## 2017-11-19 DIAGNOSIS — D509 Iron deficiency anemia, unspecified: Secondary | ICD-10-CM | POA: Diagnosis not present

## 2017-11-19 DIAGNOSIS — M6281 Muscle weakness (generalized): Secondary | ICD-10-CM | POA: Diagnosis not present

## 2017-11-19 DIAGNOSIS — R262 Difficulty in walking, not elsewhere classified: Secondary | ICD-10-CM | POA: Diagnosis not present

## 2017-11-19 DIAGNOSIS — E1142 Type 2 diabetes mellitus with diabetic polyneuropathy: Secondary | ICD-10-CM | POA: Diagnosis not present

## 2017-11-19 DIAGNOSIS — R1310 Dysphagia, unspecified: Secondary | ICD-10-CM | POA: Diagnosis not present

## 2017-11-19 DIAGNOSIS — N2581 Secondary hyperparathyroidism of renal origin: Secondary | ICD-10-CM | POA: Diagnosis not present

## 2017-11-19 DIAGNOSIS — I120 Hypertensive chronic kidney disease with stage 5 chronic kidney disease or end stage renal disease: Secondary | ICD-10-CM | POA: Diagnosis not present

## 2017-11-19 DIAGNOSIS — R488 Other symbolic dysfunctions: Secondary | ICD-10-CM | POA: Diagnosis not present

## 2017-11-19 DIAGNOSIS — I5040 Unspecified combined systolic (congestive) and diastolic (congestive) heart failure: Secondary | ICD-10-CM | POA: Diagnosis not present

## 2017-11-19 DIAGNOSIS — N186 End stage renal disease: Secondary | ICD-10-CM | POA: Diagnosis not present

## 2017-11-19 DIAGNOSIS — E119 Type 2 diabetes mellitus without complications: Secondary | ICD-10-CM | POA: Diagnosis not present

## 2017-11-20 DIAGNOSIS — Z13220 Encounter for screening for lipoid disorders: Secondary | ICD-10-CM | POA: Diagnosis not present

## 2017-11-20 DIAGNOSIS — M6281 Muscle weakness (generalized): Secondary | ICD-10-CM | POA: Diagnosis not present

## 2017-11-20 DIAGNOSIS — R7989 Other specified abnormal findings of blood chemistry: Secondary | ICD-10-CM | POA: Diagnosis not present

## 2017-11-20 DIAGNOSIS — R7309 Other abnormal glucose: Secondary | ICD-10-CM | POA: Diagnosis not present

## 2017-11-20 DIAGNOSIS — D649 Anemia, unspecified: Secondary | ICD-10-CM | POA: Diagnosis not present

## 2017-11-20 DIAGNOSIS — I5040 Unspecified combined systolic (congestive) and diastolic (congestive) heart failure: Secondary | ICD-10-CM | POA: Diagnosis not present

## 2017-11-20 DIAGNOSIS — R1310 Dysphagia, unspecified: Secondary | ICD-10-CM | POA: Diagnosis not present

## 2017-11-20 DIAGNOSIS — E785 Hyperlipidemia, unspecified: Secondary | ICD-10-CM | POA: Diagnosis not present

## 2017-11-20 DIAGNOSIS — R488 Other symbolic dysfunctions: Secondary | ICD-10-CM | POA: Diagnosis not present

## 2017-11-20 DIAGNOSIS — R262 Difficulty in walking, not elsewhere classified: Secondary | ICD-10-CM | POA: Diagnosis not present

## 2017-11-20 DIAGNOSIS — E119 Type 2 diabetes mellitus without complications: Secondary | ICD-10-CM | POA: Diagnosis not present

## 2017-11-20 DIAGNOSIS — R6889 Other general symptoms and signs: Secondary | ICD-10-CM | POA: Diagnosis not present

## 2017-11-22 DIAGNOSIS — D509 Iron deficiency anemia, unspecified: Secondary | ICD-10-CM | POA: Diagnosis not present

## 2017-11-22 DIAGNOSIS — M1712 Unilateral primary osteoarthritis, left knee: Secondary | ICD-10-CM | POA: Diagnosis not present

## 2017-11-22 DIAGNOSIS — N2581 Secondary hyperparathyroidism of renal origin: Secondary | ICD-10-CM | POA: Diagnosis not present

## 2017-11-22 DIAGNOSIS — E119 Type 2 diabetes mellitus without complications: Secondary | ICD-10-CM | POA: Diagnosis not present

## 2017-11-22 DIAGNOSIS — I5040 Unspecified combined systolic (congestive) and diastolic (congestive) heart failure: Secondary | ICD-10-CM | POA: Diagnosis not present

## 2017-11-22 DIAGNOSIS — M6281 Muscle weakness (generalized): Secondary | ICD-10-CM | POA: Diagnosis not present

## 2017-11-22 DIAGNOSIS — R1310 Dysphagia, unspecified: Secondary | ICD-10-CM | POA: Diagnosis not present

## 2017-11-22 DIAGNOSIS — R488 Other symbolic dysfunctions: Secondary | ICD-10-CM | POA: Diagnosis not present

## 2017-11-22 DIAGNOSIS — N186 End stage renal disease: Secondary | ICD-10-CM | POA: Diagnosis not present

## 2017-11-22 DIAGNOSIS — R262 Difficulty in walking, not elsewhere classified: Secondary | ICD-10-CM | POA: Diagnosis not present

## 2017-11-23 DIAGNOSIS — R488 Other symbolic dysfunctions: Secondary | ICD-10-CM | POA: Diagnosis not present

## 2017-11-23 DIAGNOSIS — M6281 Muscle weakness (generalized): Secondary | ICD-10-CM | POA: Diagnosis not present

## 2017-11-23 DIAGNOSIS — R1310 Dysphagia, unspecified: Secondary | ICD-10-CM | POA: Diagnosis not present

## 2017-11-23 DIAGNOSIS — R262 Difficulty in walking, not elsewhere classified: Secondary | ICD-10-CM | POA: Diagnosis not present

## 2017-11-23 DIAGNOSIS — E1142 Type 2 diabetes mellitus with diabetic polyneuropathy: Secondary | ICD-10-CM | POA: Diagnosis not present

## 2017-11-23 DIAGNOSIS — I10 Essential (primary) hypertension: Secondary | ICD-10-CM | POA: Diagnosis not present

## 2017-11-23 DIAGNOSIS — I5040 Unspecified combined systolic (congestive) and diastolic (congestive) heart failure: Secondary | ICD-10-CM | POA: Diagnosis not present

## 2017-11-24 DIAGNOSIS — E119 Type 2 diabetes mellitus without complications: Secondary | ICD-10-CM | POA: Diagnosis not present

## 2017-11-24 DIAGNOSIS — M6281 Muscle weakness (generalized): Secondary | ICD-10-CM | POA: Diagnosis not present

## 2017-11-24 DIAGNOSIS — N2581 Secondary hyperparathyroidism of renal origin: Secondary | ICD-10-CM | POA: Diagnosis not present

## 2017-11-24 DIAGNOSIS — R1310 Dysphagia, unspecified: Secondary | ICD-10-CM | POA: Diagnosis not present

## 2017-11-24 DIAGNOSIS — D509 Iron deficiency anemia, unspecified: Secondary | ICD-10-CM | POA: Diagnosis not present

## 2017-11-24 DIAGNOSIS — I5040 Unspecified combined systolic (congestive) and diastolic (congestive) heart failure: Secondary | ICD-10-CM | POA: Diagnosis not present

## 2017-11-24 DIAGNOSIS — N186 End stage renal disease: Secondary | ICD-10-CM | POA: Diagnosis not present

## 2017-11-24 DIAGNOSIS — R262 Difficulty in walking, not elsewhere classified: Secondary | ICD-10-CM | POA: Diagnosis not present

## 2017-11-24 DIAGNOSIS — R488 Other symbolic dysfunctions: Secondary | ICD-10-CM | POA: Diagnosis not present

## 2017-11-26 DIAGNOSIS — N186 End stage renal disease: Secondary | ICD-10-CM | POA: Diagnosis not present

## 2017-11-26 DIAGNOSIS — D509 Iron deficiency anemia, unspecified: Secondary | ICD-10-CM | POA: Diagnosis not present

## 2017-11-26 DIAGNOSIS — I5040 Unspecified combined systolic (congestive) and diastolic (congestive) heart failure: Secondary | ICD-10-CM | POA: Diagnosis not present

## 2017-11-26 DIAGNOSIS — E119 Type 2 diabetes mellitus without complications: Secondary | ICD-10-CM | POA: Diagnosis not present

## 2017-11-26 DIAGNOSIS — E1142 Type 2 diabetes mellitus with diabetic polyneuropathy: Secondary | ICD-10-CM | POA: Diagnosis not present

## 2017-11-26 DIAGNOSIS — F0151 Vascular dementia with behavioral disturbance: Secondary | ICD-10-CM | POA: Diagnosis not present

## 2017-11-26 DIAGNOSIS — R1310 Dysphagia, unspecified: Secondary | ICD-10-CM | POA: Diagnosis not present

## 2017-11-26 DIAGNOSIS — N2581 Secondary hyperparathyroidism of renal origin: Secondary | ICD-10-CM | POA: Diagnosis not present

## 2017-11-26 DIAGNOSIS — M6281 Muscle weakness (generalized): Secondary | ICD-10-CM | POA: Diagnosis not present

## 2017-11-26 DIAGNOSIS — R488 Other symbolic dysfunctions: Secondary | ICD-10-CM | POA: Diagnosis not present

## 2017-11-26 DIAGNOSIS — R262 Difficulty in walking, not elsewhere classified: Secondary | ICD-10-CM | POA: Diagnosis not present

## 2017-11-27 DIAGNOSIS — R262 Difficulty in walking, not elsewhere classified: Secondary | ICD-10-CM | POA: Diagnosis not present

## 2017-11-27 DIAGNOSIS — M6281 Muscle weakness (generalized): Secondary | ICD-10-CM | POA: Diagnosis not present

## 2017-11-27 DIAGNOSIS — E559 Vitamin D deficiency, unspecified: Secondary | ICD-10-CM | POA: Diagnosis not present

## 2017-11-27 DIAGNOSIS — R7989 Other specified abnormal findings of blood chemistry: Secondary | ICD-10-CM | POA: Diagnosis not present

## 2017-11-27 DIAGNOSIS — E039 Hypothyroidism, unspecified: Secondary | ICD-10-CM | POA: Diagnosis not present

## 2017-11-27 DIAGNOSIS — R6889 Other general symptoms and signs: Secondary | ICD-10-CM | POA: Diagnosis not present

## 2017-11-27 DIAGNOSIS — R1310 Dysphagia, unspecified: Secondary | ICD-10-CM | POA: Diagnosis not present

## 2017-11-27 DIAGNOSIS — R488 Other symbolic dysfunctions: Secondary | ICD-10-CM | POA: Diagnosis not present

## 2017-11-27 DIAGNOSIS — I5040 Unspecified combined systolic (congestive) and diastolic (congestive) heart failure: Secondary | ICD-10-CM | POA: Diagnosis not present

## 2017-11-29 DIAGNOSIS — N2581 Secondary hyperparathyroidism of renal origin: Secondary | ICD-10-CM | POA: Diagnosis not present

## 2017-11-29 DIAGNOSIS — N186 End stage renal disease: Secondary | ICD-10-CM | POA: Diagnosis not present

## 2017-11-29 DIAGNOSIS — R262 Difficulty in walking, not elsewhere classified: Secondary | ICD-10-CM | POA: Diagnosis not present

## 2017-11-29 DIAGNOSIS — I5040 Unspecified combined systolic (congestive) and diastolic (congestive) heart failure: Secondary | ICD-10-CM | POA: Diagnosis not present

## 2017-11-29 DIAGNOSIS — E119 Type 2 diabetes mellitus without complications: Secondary | ICD-10-CM | POA: Diagnosis not present

## 2017-11-29 DIAGNOSIS — K219 Gastro-esophageal reflux disease without esophagitis: Secondary | ICD-10-CM | POA: Diagnosis not present

## 2017-11-29 DIAGNOSIS — E1142 Type 2 diabetes mellitus with diabetic polyneuropathy: Secondary | ICD-10-CM | POA: Diagnosis not present

## 2017-11-29 DIAGNOSIS — R488 Other symbolic dysfunctions: Secondary | ICD-10-CM | POA: Diagnosis not present

## 2017-11-29 DIAGNOSIS — R1310 Dysphagia, unspecified: Secondary | ICD-10-CM | POA: Diagnosis not present

## 2017-11-29 DIAGNOSIS — I10 Essential (primary) hypertension: Secondary | ICD-10-CM | POA: Diagnosis not present

## 2017-11-29 DIAGNOSIS — D509 Iron deficiency anemia, unspecified: Secondary | ICD-10-CM | POA: Diagnosis not present

## 2017-11-29 DIAGNOSIS — M6281 Muscle weakness (generalized): Secondary | ICD-10-CM | POA: Diagnosis not present

## 2017-11-30 DIAGNOSIS — I5040 Unspecified combined systolic (congestive) and diastolic (congestive) heart failure: Secondary | ICD-10-CM | POA: Diagnosis not present

## 2017-11-30 DIAGNOSIS — M6281 Muscle weakness (generalized): Secondary | ICD-10-CM | POA: Diagnosis not present

## 2017-11-30 DIAGNOSIS — R488 Other symbolic dysfunctions: Secondary | ICD-10-CM | POA: Diagnosis not present

## 2017-11-30 DIAGNOSIS — R1310 Dysphagia, unspecified: Secondary | ICD-10-CM | POA: Diagnosis not present

## 2017-11-30 DIAGNOSIS — R262 Difficulty in walking, not elsewhere classified: Secondary | ICD-10-CM | POA: Diagnosis not present

## 2017-12-01 DIAGNOSIS — R262 Difficulty in walking, not elsewhere classified: Secondary | ICD-10-CM | POA: Diagnosis not present

## 2017-12-01 DIAGNOSIS — N2581 Secondary hyperparathyroidism of renal origin: Secondary | ICD-10-CM | POA: Diagnosis not present

## 2017-12-01 DIAGNOSIS — I5040 Unspecified combined systolic (congestive) and diastolic (congestive) heart failure: Secondary | ICD-10-CM | POA: Diagnosis not present

## 2017-12-01 DIAGNOSIS — M6281 Muscle weakness (generalized): Secondary | ICD-10-CM | POA: Diagnosis not present

## 2017-12-01 DIAGNOSIS — R1310 Dysphagia, unspecified: Secondary | ICD-10-CM | POA: Diagnosis not present

## 2017-12-01 DIAGNOSIS — D509 Iron deficiency anemia, unspecified: Secondary | ICD-10-CM | POA: Diagnosis not present

## 2017-12-01 DIAGNOSIS — N186 End stage renal disease: Secondary | ICD-10-CM | POA: Diagnosis not present

## 2017-12-01 DIAGNOSIS — R488 Other symbolic dysfunctions: Secondary | ICD-10-CM | POA: Diagnosis not present

## 2017-12-01 DIAGNOSIS — E119 Type 2 diabetes mellitus without complications: Secondary | ICD-10-CM | POA: Diagnosis not present

## 2017-12-02 DIAGNOSIS — E1142 Type 2 diabetes mellitus with diabetic polyneuropathy: Secondary | ICD-10-CM | POA: Diagnosis not present

## 2017-12-02 DIAGNOSIS — N186 End stage renal disease: Secondary | ICD-10-CM | POA: Diagnosis not present

## 2017-12-02 DIAGNOSIS — M6281 Muscle weakness (generalized): Secondary | ICD-10-CM | POA: Diagnosis not present

## 2017-12-02 DIAGNOSIS — R1310 Dysphagia, unspecified: Secondary | ICD-10-CM | POA: Diagnosis not present

## 2017-12-02 DIAGNOSIS — R262 Difficulty in walking, not elsewhere classified: Secondary | ICD-10-CM | POA: Diagnosis not present

## 2017-12-02 DIAGNOSIS — I5040 Unspecified combined systolic (congestive) and diastolic (congestive) heart failure: Secondary | ICD-10-CM | POA: Diagnosis not present

## 2017-12-02 DIAGNOSIS — I10 Essential (primary) hypertension: Secondary | ICD-10-CM | POA: Diagnosis not present

## 2017-12-02 DIAGNOSIS — R488 Other symbolic dysfunctions: Secondary | ICD-10-CM | POA: Diagnosis not present

## 2017-12-03 DIAGNOSIS — D509 Iron deficiency anemia, unspecified: Secondary | ICD-10-CM | POA: Diagnosis not present

## 2017-12-03 DIAGNOSIS — E119 Type 2 diabetes mellitus without complications: Secondary | ICD-10-CM | POA: Diagnosis not present

## 2017-12-03 DIAGNOSIS — N186 End stage renal disease: Secondary | ICD-10-CM | POA: Diagnosis not present

## 2017-12-03 DIAGNOSIS — N2581 Secondary hyperparathyroidism of renal origin: Secondary | ICD-10-CM | POA: Diagnosis not present

## 2017-12-03 DIAGNOSIS — R262 Difficulty in walking, not elsewhere classified: Secondary | ICD-10-CM | POA: Diagnosis not present

## 2017-12-03 DIAGNOSIS — R1310 Dysphagia, unspecified: Secondary | ICD-10-CM | POA: Diagnosis not present

## 2017-12-03 DIAGNOSIS — R488 Other symbolic dysfunctions: Secondary | ICD-10-CM | POA: Diagnosis not present

## 2017-12-03 DIAGNOSIS — I5040 Unspecified combined systolic (congestive) and diastolic (congestive) heart failure: Secondary | ICD-10-CM | POA: Diagnosis not present

## 2017-12-03 DIAGNOSIS — M6281 Muscle weakness (generalized): Secondary | ICD-10-CM | POA: Diagnosis not present

## 2017-12-04 DIAGNOSIS — R488 Other symbolic dysfunctions: Secondary | ICD-10-CM | POA: Diagnosis not present

## 2017-12-04 DIAGNOSIS — R1310 Dysphagia, unspecified: Secondary | ICD-10-CM | POA: Diagnosis not present

## 2017-12-04 DIAGNOSIS — M6281 Muscle weakness (generalized): Secondary | ICD-10-CM | POA: Diagnosis not present

## 2017-12-04 DIAGNOSIS — I5040 Unspecified combined systolic (congestive) and diastolic (congestive) heart failure: Secondary | ICD-10-CM | POA: Diagnosis not present

## 2017-12-04 DIAGNOSIS — R262 Difficulty in walking, not elsewhere classified: Secondary | ICD-10-CM | POA: Diagnosis not present

## 2017-12-06 DIAGNOSIS — I5040 Unspecified combined systolic (congestive) and diastolic (congestive) heart failure: Secondary | ICD-10-CM | POA: Diagnosis not present

## 2017-12-06 DIAGNOSIS — E119 Type 2 diabetes mellitus without complications: Secondary | ICD-10-CM | POA: Diagnosis not present

## 2017-12-06 DIAGNOSIS — D509 Iron deficiency anemia, unspecified: Secondary | ICD-10-CM | POA: Diagnosis not present

## 2017-12-06 DIAGNOSIS — N186 End stage renal disease: Secondary | ICD-10-CM | POA: Diagnosis not present

## 2017-12-06 DIAGNOSIS — M6281 Muscle weakness (generalized): Secondary | ICD-10-CM | POA: Diagnosis not present

## 2017-12-06 DIAGNOSIS — R1310 Dysphagia, unspecified: Secondary | ICD-10-CM | POA: Diagnosis not present

## 2017-12-06 DIAGNOSIS — R262 Difficulty in walking, not elsewhere classified: Secondary | ICD-10-CM | POA: Diagnosis not present

## 2017-12-06 DIAGNOSIS — N2581 Secondary hyperparathyroidism of renal origin: Secondary | ICD-10-CM | POA: Diagnosis not present

## 2017-12-06 DIAGNOSIS — R488 Other symbolic dysfunctions: Secondary | ICD-10-CM | POA: Diagnosis not present

## 2017-12-07 DIAGNOSIS — R488 Other symbolic dysfunctions: Secondary | ICD-10-CM | POA: Diagnosis not present

## 2017-12-07 DIAGNOSIS — I5040 Unspecified combined systolic (congestive) and diastolic (congestive) heart failure: Secondary | ICD-10-CM | POA: Diagnosis not present

## 2017-12-07 DIAGNOSIS — R262 Difficulty in walking, not elsewhere classified: Secondary | ICD-10-CM | POA: Diagnosis not present

## 2017-12-07 DIAGNOSIS — M6281 Muscle weakness (generalized): Secondary | ICD-10-CM | POA: Diagnosis not present

## 2017-12-07 DIAGNOSIS — R1310 Dysphagia, unspecified: Secondary | ICD-10-CM | POA: Diagnosis not present

## 2017-12-08 DIAGNOSIS — E119 Type 2 diabetes mellitus without complications: Secondary | ICD-10-CM | POA: Diagnosis not present

## 2017-12-08 DIAGNOSIS — R262 Difficulty in walking, not elsewhere classified: Secondary | ICD-10-CM | POA: Diagnosis not present

## 2017-12-08 DIAGNOSIS — N186 End stage renal disease: Secondary | ICD-10-CM | POA: Diagnosis not present

## 2017-12-08 DIAGNOSIS — R1310 Dysphagia, unspecified: Secondary | ICD-10-CM | POA: Diagnosis not present

## 2017-12-08 DIAGNOSIS — M6281 Muscle weakness (generalized): Secondary | ICD-10-CM | POA: Diagnosis not present

## 2017-12-08 DIAGNOSIS — D509 Iron deficiency anemia, unspecified: Secondary | ICD-10-CM | POA: Diagnosis not present

## 2017-12-08 DIAGNOSIS — R488 Other symbolic dysfunctions: Secondary | ICD-10-CM | POA: Diagnosis not present

## 2017-12-08 DIAGNOSIS — N2581 Secondary hyperparathyroidism of renal origin: Secondary | ICD-10-CM | POA: Diagnosis not present

## 2017-12-08 DIAGNOSIS — I5040 Unspecified combined systolic (congestive) and diastolic (congestive) heart failure: Secondary | ICD-10-CM | POA: Diagnosis not present

## 2017-12-09 DIAGNOSIS — M6281 Muscle weakness (generalized): Secondary | ICD-10-CM | POA: Diagnosis not present

## 2017-12-09 DIAGNOSIS — R488 Other symbolic dysfunctions: Secondary | ICD-10-CM | POA: Diagnosis not present

## 2017-12-09 DIAGNOSIS — I5040 Unspecified combined systolic (congestive) and diastolic (congestive) heart failure: Secondary | ICD-10-CM | POA: Diagnosis not present

## 2017-12-09 DIAGNOSIS — R262 Difficulty in walking, not elsewhere classified: Secondary | ICD-10-CM | POA: Diagnosis not present

## 2017-12-09 DIAGNOSIS — R1310 Dysphagia, unspecified: Secondary | ICD-10-CM | POA: Diagnosis not present

## 2017-12-10 DIAGNOSIS — D509 Iron deficiency anemia, unspecified: Secondary | ICD-10-CM | POA: Diagnosis not present

## 2017-12-10 DIAGNOSIS — E119 Type 2 diabetes mellitus without complications: Secondary | ICD-10-CM | POA: Diagnosis not present

## 2017-12-10 DIAGNOSIS — I5040 Unspecified combined systolic (congestive) and diastolic (congestive) heart failure: Secondary | ICD-10-CM | POA: Diagnosis not present

## 2017-12-10 DIAGNOSIS — N186 End stage renal disease: Secondary | ICD-10-CM | POA: Diagnosis not present

## 2017-12-10 DIAGNOSIS — R1310 Dysphagia, unspecified: Secondary | ICD-10-CM | POA: Diagnosis not present

## 2017-12-10 DIAGNOSIS — R488 Other symbolic dysfunctions: Secondary | ICD-10-CM | POA: Diagnosis not present

## 2017-12-10 DIAGNOSIS — M6281 Muscle weakness (generalized): Secondary | ICD-10-CM | POA: Diagnosis not present

## 2017-12-10 DIAGNOSIS — R262 Difficulty in walking, not elsewhere classified: Secondary | ICD-10-CM | POA: Diagnosis not present

## 2017-12-10 DIAGNOSIS — I10 Essential (primary) hypertension: Secondary | ICD-10-CM | POA: Diagnosis not present

## 2017-12-10 DIAGNOSIS — E1142 Type 2 diabetes mellitus with diabetic polyneuropathy: Secondary | ICD-10-CM | POA: Diagnosis not present

## 2017-12-10 DIAGNOSIS — N2581 Secondary hyperparathyroidism of renal origin: Secondary | ICD-10-CM | POA: Diagnosis not present

## 2017-12-13 DIAGNOSIS — N2581 Secondary hyperparathyroidism of renal origin: Secondary | ICD-10-CM | POA: Diagnosis not present

## 2017-12-13 DIAGNOSIS — D509 Iron deficiency anemia, unspecified: Secondary | ICD-10-CM | POA: Diagnosis not present

## 2017-12-13 DIAGNOSIS — R262 Difficulty in walking, not elsewhere classified: Secondary | ICD-10-CM | POA: Diagnosis not present

## 2017-12-13 DIAGNOSIS — N186 End stage renal disease: Secondary | ICD-10-CM | POA: Diagnosis not present

## 2017-12-13 DIAGNOSIS — I5040 Unspecified combined systolic (congestive) and diastolic (congestive) heart failure: Secondary | ICD-10-CM | POA: Diagnosis not present

## 2017-12-13 DIAGNOSIS — R488 Other symbolic dysfunctions: Secondary | ICD-10-CM | POA: Diagnosis not present

## 2017-12-13 DIAGNOSIS — M6281 Muscle weakness (generalized): Secondary | ICD-10-CM | POA: Diagnosis not present

## 2017-12-13 DIAGNOSIS — R1310 Dysphagia, unspecified: Secondary | ICD-10-CM | POA: Diagnosis not present

## 2017-12-13 DIAGNOSIS — E119 Type 2 diabetes mellitus without complications: Secondary | ICD-10-CM | POA: Diagnosis not present

## 2017-12-14 DIAGNOSIS — M6281 Muscle weakness (generalized): Secondary | ICD-10-CM | POA: Diagnosis not present

## 2017-12-14 DIAGNOSIS — R488 Other symbolic dysfunctions: Secondary | ICD-10-CM | POA: Diagnosis not present

## 2017-12-14 DIAGNOSIS — I5040 Unspecified combined systolic (congestive) and diastolic (congestive) heart failure: Secondary | ICD-10-CM | POA: Diagnosis not present

## 2017-12-14 DIAGNOSIS — R262 Difficulty in walking, not elsewhere classified: Secondary | ICD-10-CM | POA: Diagnosis not present

## 2017-12-14 DIAGNOSIS — R1310 Dysphagia, unspecified: Secondary | ICD-10-CM | POA: Diagnosis not present

## 2017-12-15 DIAGNOSIS — D509 Iron deficiency anemia, unspecified: Secondary | ICD-10-CM | POA: Diagnosis not present

## 2017-12-15 DIAGNOSIS — I10 Essential (primary) hypertension: Secondary | ICD-10-CM | POA: Diagnosis not present

## 2017-12-15 DIAGNOSIS — E119 Type 2 diabetes mellitus without complications: Secondary | ICD-10-CM | POA: Diagnosis not present

## 2017-12-15 DIAGNOSIS — I5040 Unspecified combined systolic (congestive) and diastolic (congestive) heart failure: Secondary | ICD-10-CM | POA: Diagnosis not present

## 2017-12-15 DIAGNOSIS — R1310 Dysphagia, unspecified: Secondary | ICD-10-CM | POA: Diagnosis not present

## 2017-12-15 DIAGNOSIS — I120 Hypertensive chronic kidney disease with stage 5 chronic kidney disease or end stage renal disease: Secondary | ICD-10-CM | POA: Diagnosis not present

## 2017-12-15 DIAGNOSIS — R262 Difficulty in walking, not elsewhere classified: Secondary | ICD-10-CM | POA: Diagnosis not present

## 2017-12-15 DIAGNOSIS — M6281 Muscle weakness (generalized): Secondary | ICD-10-CM | POA: Diagnosis not present

## 2017-12-15 DIAGNOSIS — N2581 Secondary hyperparathyroidism of renal origin: Secondary | ICD-10-CM | POA: Diagnosis not present

## 2017-12-15 DIAGNOSIS — N186 End stage renal disease: Secondary | ICD-10-CM | POA: Diagnosis not present

## 2017-12-15 DIAGNOSIS — I739 Peripheral vascular disease, unspecified: Secondary | ICD-10-CM | POA: Diagnosis not present

## 2017-12-15 DIAGNOSIS — R488 Other symbolic dysfunctions: Secondary | ICD-10-CM | POA: Diagnosis not present

## 2017-12-16 DIAGNOSIS — I5040 Unspecified combined systolic (congestive) and diastolic (congestive) heart failure: Secondary | ICD-10-CM | POA: Diagnosis not present

## 2017-12-16 DIAGNOSIS — R262 Difficulty in walking, not elsewhere classified: Secondary | ICD-10-CM | POA: Diagnosis not present

## 2017-12-16 DIAGNOSIS — R488 Other symbolic dysfunctions: Secondary | ICD-10-CM | POA: Diagnosis not present

## 2017-12-16 DIAGNOSIS — M6281 Muscle weakness (generalized): Secondary | ICD-10-CM | POA: Diagnosis not present

## 2017-12-16 DIAGNOSIS — R1310 Dysphagia, unspecified: Secondary | ICD-10-CM | POA: Diagnosis not present

## 2017-12-17 DIAGNOSIS — N2581 Secondary hyperparathyroidism of renal origin: Secondary | ICD-10-CM | POA: Diagnosis not present

## 2017-12-17 DIAGNOSIS — R262 Difficulty in walking, not elsewhere classified: Secondary | ICD-10-CM | POA: Diagnosis not present

## 2017-12-17 DIAGNOSIS — I5040 Unspecified combined systolic (congestive) and diastolic (congestive) heart failure: Secondary | ICD-10-CM | POA: Diagnosis not present

## 2017-12-17 DIAGNOSIS — R1310 Dysphagia, unspecified: Secondary | ICD-10-CM | POA: Diagnosis not present

## 2017-12-17 DIAGNOSIS — M6281 Muscle weakness (generalized): Secondary | ICD-10-CM | POA: Diagnosis not present

## 2017-12-17 DIAGNOSIS — N186 End stage renal disease: Secondary | ICD-10-CM | POA: Diagnosis not present

## 2017-12-17 DIAGNOSIS — E119 Type 2 diabetes mellitus without complications: Secondary | ICD-10-CM | POA: Diagnosis not present

## 2017-12-17 DIAGNOSIS — R488 Other symbolic dysfunctions: Secondary | ICD-10-CM | POA: Diagnosis not present

## 2017-12-17 DIAGNOSIS — D509 Iron deficiency anemia, unspecified: Secondary | ICD-10-CM | POA: Diagnosis not present

## 2017-12-18 DIAGNOSIS — E1129 Type 2 diabetes mellitus with other diabetic kidney complication: Secondary | ICD-10-CM | POA: Diagnosis not present

## 2017-12-18 DIAGNOSIS — N186 End stage renal disease: Secondary | ICD-10-CM | POA: Diagnosis not present

## 2017-12-18 DIAGNOSIS — Z992 Dependence on renal dialysis: Secondary | ICD-10-CM | POA: Diagnosis not present

## 2017-12-20 DIAGNOSIS — E119 Type 2 diabetes mellitus without complications: Secondary | ICD-10-CM | POA: Diagnosis not present

## 2017-12-20 DIAGNOSIS — I5040 Unspecified combined systolic (congestive) and diastolic (congestive) heart failure: Secondary | ICD-10-CM | POA: Diagnosis not present

## 2017-12-20 DIAGNOSIS — N186 End stage renal disease: Secondary | ICD-10-CM | POA: Diagnosis not present

## 2017-12-20 DIAGNOSIS — N2581 Secondary hyperparathyroidism of renal origin: Secondary | ICD-10-CM | POA: Diagnosis not present

## 2017-12-20 DIAGNOSIS — R1310 Dysphagia, unspecified: Secondary | ICD-10-CM | POA: Diagnosis not present

## 2017-12-20 DIAGNOSIS — M6281 Muscle weakness (generalized): Secondary | ICD-10-CM | POA: Diagnosis not present

## 2017-12-20 DIAGNOSIS — R488 Other symbolic dysfunctions: Secondary | ICD-10-CM | POA: Diagnosis not present

## 2017-12-20 DIAGNOSIS — R262 Difficulty in walking, not elsewhere classified: Secondary | ICD-10-CM | POA: Diagnosis not present

## 2017-12-21 DIAGNOSIS — R262 Difficulty in walking, not elsewhere classified: Secondary | ICD-10-CM | POA: Diagnosis not present

## 2017-12-21 DIAGNOSIS — M6281 Muscle weakness (generalized): Secondary | ICD-10-CM | POA: Diagnosis not present

## 2017-12-21 DIAGNOSIS — I5040 Unspecified combined systolic (congestive) and diastolic (congestive) heart failure: Secondary | ICD-10-CM | POA: Diagnosis not present

## 2017-12-21 DIAGNOSIS — R488 Other symbolic dysfunctions: Secondary | ICD-10-CM | POA: Diagnosis not present

## 2017-12-21 DIAGNOSIS — R1310 Dysphagia, unspecified: Secondary | ICD-10-CM | POA: Diagnosis not present

## 2017-12-22 DIAGNOSIS — R262 Difficulty in walking, not elsewhere classified: Secondary | ICD-10-CM | POA: Diagnosis not present

## 2017-12-22 DIAGNOSIS — R1310 Dysphagia, unspecified: Secondary | ICD-10-CM | POA: Diagnosis not present

## 2017-12-22 DIAGNOSIS — N2581 Secondary hyperparathyroidism of renal origin: Secondary | ICD-10-CM | POA: Diagnosis not present

## 2017-12-22 DIAGNOSIS — M6281 Muscle weakness (generalized): Secondary | ICD-10-CM | POA: Diagnosis not present

## 2017-12-22 DIAGNOSIS — E119 Type 2 diabetes mellitus without complications: Secondary | ICD-10-CM | POA: Diagnosis not present

## 2017-12-22 DIAGNOSIS — I5040 Unspecified combined systolic (congestive) and diastolic (congestive) heart failure: Secondary | ICD-10-CM | POA: Diagnosis not present

## 2017-12-22 DIAGNOSIS — R488 Other symbolic dysfunctions: Secondary | ICD-10-CM | POA: Diagnosis not present

## 2017-12-22 DIAGNOSIS — N186 End stage renal disease: Secondary | ICD-10-CM | POA: Diagnosis not present

## 2017-12-23 DIAGNOSIS — R1310 Dysphagia, unspecified: Secondary | ICD-10-CM | POA: Diagnosis not present

## 2017-12-23 DIAGNOSIS — R488 Other symbolic dysfunctions: Secondary | ICD-10-CM | POA: Diagnosis not present

## 2017-12-23 DIAGNOSIS — M6281 Muscle weakness (generalized): Secondary | ICD-10-CM | POA: Diagnosis not present

## 2017-12-23 DIAGNOSIS — I5040 Unspecified combined systolic (congestive) and diastolic (congestive) heart failure: Secondary | ICD-10-CM | POA: Diagnosis not present

## 2017-12-23 DIAGNOSIS — R262 Difficulty in walking, not elsewhere classified: Secondary | ICD-10-CM | POA: Diagnosis not present

## 2017-12-24 DIAGNOSIS — N2581 Secondary hyperparathyroidism of renal origin: Secondary | ICD-10-CM | POA: Diagnosis not present

## 2017-12-24 DIAGNOSIS — R262 Difficulty in walking, not elsewhere classified: Secondary | ICD-10-CM | POA: Diagnosis not present

## 2017-12-24 DIAGNOSIS — R1310 Dysphagia, unspecified: Secondary | ICD-10-CM | POA: Diagnosis not present

## 2017-12-24 DIAGNOSIS — R488 Other symbolic dysfunctions: Secondary | ICD-10-CM | POA: Diagnosis not present

## 2017-12-24 DIAGNOSIS — E119 Type 2 diabetes mellitus without complications: Secondary | ICD-10-CM | POA: Diagnosis not present

## 2017-12-24 DIAGNOSIS — N186 End stage renal disease: Secondary | ICD-10-CM | POA: Diagnosis not present

## 2017-12-24 DIAGNOSIS — M6281 Muscle weakness (generalized): Secondary | ICD-10-CM | POA: Diagnosis not present

## 2017-12-24 DIAGNOSIS — I5040 Unspecified combined systolic (congestive) and diastolic (congestive) heart failure: Secondary | ICD-10-CM | POA: Diagnosis not present

## 2017-12-27 DIAGNOSIS — R262 Difficulty in walking, not elsewhere classified: Secondary | ICD-10-CM | POA: Diagnosis not present

## 2017-12-27 DIAGNOSIS — E119 Type 2 diabetes mellitus without complications: Secondary | ICD-10-CM | POA: Diagnosis not present

## 2017-12-27 DIAGNOSIS — I5040 Unspecified combined systolic (congestive) and diastolic (congestive) heart failure: Secondary | ICD-10-CM | POA: Diagnosis not present

## 2017-12-27 DIAGNOSIS — N2581 Secondary hyperparathyroidism of renal origin: Secondary | ICD-10-CM | POA: Diagnosis not present

## 2017-12-27 DIAGNOSIS — M6281 Muscle weakness (generalized): Secondary | ICD-10-CM | POA: Diagnosis not present

## 2017-12-27 DIAGNOSIS — R488 Other symbolic dysfunctions: Secondary | ICD-10-CM | POA: Diagnosis not present

## 2017-12-27 DIAGNOSIS — R1310 Dysphagia, unspecified: Secondary | ICD-10-CM | POA: Diagnosis not present

## 2017-12-27 DIAGNOSIS — N186 End stage renal disease: Secondary | ICD-10-CM | POA: Diagnosis not present

## 2017-12-28 DIAGNOSIS — M6281 Muscle weakness (generalized): Secondary | ICD-10-CM | POA: Diagnosis not present

## 2017-12-28 DIAGNOSIS — R262 Difficulty in walking, not elsewhere classified: Secondary | ICD-10-CM | POA: Diagnosis not present

## 2017-12-28 DIAGNOSIS — I5040 Unspecified combined systolic (congestive) and diastolic (congestive) heart failure: Secondary | ICD-10-CM | POA: Diagnosis not present

## 2017-12-28 DIAGNOSIS — R488 Other symbolic dysfunctions: Secondary | ICD-10-CM | POA: Diagnosis not present

## 2017-12-28 DIAGNOSIS — R1310 Dysphagia, unspecified: Secondary | ICD-10-CM | POA: Diagnosis not present

## 2017-12-29 DIAGNOSIS — N186 End stage renal disease: Secondary | ICD-10-CM | POA: Diagnosis not present

## 2017-12-29 DIAGNOSIS — M6281 Muscle weakness (generalized): Secondary | ICD-10-CM | POA: Diagnosis not present

## 2017-12-29 DIAGNOSIS — E119 Type 2 diabetes mellitus without complications: Secondary | ICD-10-CM | POA: Diagnosis not present

## 2017-12-29 DIAGNOSIS — R1310 Dysphagia, unspecified: Secondary | ICD-10-CM | POA: Diagnosis not present

## 2017-12-29 DIAGNOSIS — I5040 Unspecified combined systolic (congestive) and diastolic (congestive) heart failure: Secondary | ICD-10-CM | POA: Diagnosis not present

## 2017-12-29 DIAGNOSIS — N2581 Secondary hyperparathyroidism of renal origin: Secondary | ICD-10-CM | POA: Diagnosis not present

## 2017-12-29 DIAGNOSIS — R262 Difficulty in walking, not elsewhere classified: Secondary | ICD-10-CM | POA: Diagnosis not present

## 2017-12-29 DIAGNOSIS — R488 Other symbolic dysfunctions: Secondary | ICD-10-CM | POA: Diagnosis not present

## 2017-12-30 DIAGNOSIS — R262 Difficulty in walking, not elsewhere classified: Secondary | ICD-10-CM | POA: Diagnosis not present

## 2017-12-30 DIAGNOSIS — R488 Other symbolic dysfunctions: Secondary | ICD-10-CM | POA: Diagnosis not present

## 2017-12-30 DIAGNOSIS — M6281 Muscle weakness (generalized): Secondary | ICD-10-CM | POA: Diagnosis not present

## 2017-12-30 DIAGNOSIS — R1310 Dysphagia, unspecified: Secondary | ICD-10-CM | POA: Diagnosis not present

## 2017-12-30 DIAGNOSIS — I5040 Unspecified combined systolic (congestive) and diastolic (congestive) heart failure: Secondary | ICD-10-CM | POA: Diagnosis not present

## 2017-12-31 DIAGNOSIS — R1310 Dysphagia, unspecified: Secondary | ICD-10-CM | POA: Diagnosis not present

## 2017-12-31 DIAGNOSIS — N2581 Secondary hyperparathyroidism of renal origin: Secondary | ICD-10-CM | POA: Diagnosis not present

## 2017-12-31 DIAGNOSIS — E119 Type 2 diabetes mellitus without complications: Secondary | ICD-10-CM | POA: Diagnosis not present

## 2017-12-31 DIAGNOSIS — R488 Other symbolic dysfunctions: Secondary | ICD-10-CM | POA: Diagnosis not present

## 2017-12-31 DIAGNOSIS — I5040 Unspecified combined systolic (congestive) and diastolic (congestive) heart failure: Secondary | ICD-10-CM | POA: Diagnosis not present

## 2017-12-31 DIAGNOSIS — R262 Difficulty in walking, not elsewhere classified: Secondary | ICD-10-CM | POA: Diagnosis not present

## 2017-12-31 DIAGNOSIS — M6281 Muscle weakness (generalized): Secondary | ICD-10-CM | POA: Diagnosis not present

## 2017-12-31 DIAGNOSIS — N186 End stage renal disease: Secondary | ICD-10-CM | POA: Diagnosis not present

## 2018-01-03 DIAGNOSIS — R262 Difficulty in walking, not elsewhere classified: Secondary | ICD-10-CM | POA: Diagnosis not present

## 2018-01-03 DIAGNOSIS — E119 Type 2 diabetes mellitus without complications: Secondary | ICD-10-CM | POA: Diagnosis not present

## 2018-01-03 DIAGNOSIS — M6281 Muscle weakness (generalized): Secondary | ICD-10-CM | POA: Diagnosis not present

## 2018-01-03 DIAGNOSIS — N2581 Secondary hyperparathyroidism of renal origin: Secondary | ICD-10-CM | POA: Diagnosis not present

## 2018-01-03 DIAGNOSIS — N186 End stage renal disease: Secondary | ICD-10-CM | POA: Diagnosis not present

## 2018-01-03 DIAGNOSIS — R1310 Dysphagia, unspecified: Secondary | ICD-10-CM | POA: Diagnosis not present

## 2018-01-03 DIAGNOSIS — I5040 Unspecified combined systolic (congestive) and diastolic (congestive) heart failure: Secondary | ICD-10-CM | POA: Diagnosis not present

## 2018-01-03 DIAGNOSIS — R488 Other symbolic dysfunctions: Secondary | ICD-10-CM | POA: Diagnosis not present

## 2018-01-04 DIAGNOSIS — R488 Other symbolic dysfunctions: Secondary | ICD-10-CM | POA: Diagnosis not present

## 2018-01-04 DIAGNOSIS — I5040 Unspecified combined systolic (congestive) and diastolic (congestive) heart failure: Secondary | ICD-10-CM | POA: Diagnosis not present

## 2018-01-04 DIAGNOSIS — R1310 Dysphagia, unspecified: Secondary | ICD-10-CM | POA: Diagnosis not present

## 2018-01-04 DIAGNOSIS — R262 Difficulty in walking, not elsewhere classified: Secondary | ICD-10-CM | POA: Diagnosis not present

## 2018-01-04 DIAGNOSIS — M6281 Muscle weakness (generalized): Secondary | ICD-10-CM | POA: Diagnosis not present

## 2018-01-05 DIAGNOSIS — I5040 Unspecified combined systolic (congestive) and diastolic (congestive) heart failure: Secondary | ICD-10-CM | POA: Diagnosis not present

## 2018-01-05 DIAGNOSIS — N2581 Secondary hyperparathyroidism of renal origin: Secondary | ICD-10-CM | POA: Diagnosis not present

## 2018-01-05 DIAGNOSIS — R262 Difficulty in walking, not elsewhere classified: Secondary | ICD-10-CM | POA: Diagnosis not present

## 2018-01-05 DIAGNOSIS — K219 Gastro-esophageal reflux disease without esophagitis: Secondary | ICD-10-CM | POA: Diagnosis not present

## 2018-01-05 DIAGNOSIS — E119 Type 2 diabetes mellitus without complications: Secondary | ICD-10-CM | POA: Diagnosis not present

## 2018-01-05 DIAGNOSIS — R1310 Dysphagia, unspecified: Secondary | ICD-10-CM | POA: Diagnosis not present

## 2018-01-05 DIAGNOSIS — E1142 Type 2 diabetes mellitus with diabetic polyneuropathy: Secondary | ICD-10-CM | POA: Diagnosis not present

## 2018-01-05 DIAGNOSIS — I10 Essential (primary) hypertension: Secondary | ICD-10-CM | POA: Diagnosis not present

## 2018-01-05 DIAGNOSIS — M6281 Muscle weakness (generalized): Secondary | ICD-10-CM | POA: Diagnosis not present

## 2018-01-05 DIAGNOSIS — R488 Other symbolic dysfunctions: Secondary | ICD-10-CM | POA: Diagnosis not present

## 2018-01-05 DIAGNOSIS — N186 End stage renal disease: Secondary | ICD-10-CM | POA: Diagnosis not present

## 2018-01-07 DIAGNOSIS — N2581 Secondary hyperparathyroidism of renal origin: Secondary | ICD-10-CM | POA: Diagnosis not present

## 2018-01-07 DIAGNOSIS — E119 Type 2 diabetes mellitus without complications: Secondary | ICD-10-CM | POA: Diagnosis not present

## 2018-01-07 DIAGNOSIS — N186 End stage renal disease: Secondary | ICD-10-CM | POA: Diagnosis not present

## 2018-01-08 DIAGNOSIS — R488 Other symbolic dysfunctions: Secondary | ICD-10-CM | POA: Diagnosis not present

## 2018-01-08 DIAGNOSIS — M6281 Muscle weakness (generalized): Secondary | ICD-10-CM | POA: Diagnosis not present

## 2018-01-08 DIAGNOSIS — R1310 Dysphagia, unspecified: Secondary | ICD-10-CM | POA: Diagnosis not present

## 2018-01-08 DIAGNOSIS — I5040 Unspecified combined systolic (congestive) and diastolic (congestive) heart failure: Secondary | ICD-10-CM | POA: Diagnosis not present

## 2018-01-08 DIAGNOSIS — R262 Difficulty in walking, not elsewhere classified: Secondary | ICD-10-CM | POA: Diagnosis not present

## 2018-01-09 DIAGNOSIS — R262 Difficulty in walking, not elsewhere classified: Secondary | ICD-10-CM | POA: Diagnosis not present

## 2018-01-09 DIAGNOSIS — I5040 Unspecified combined systolic (congestive) and diastolic (congestive) heart failure: Secondary | ICD-10-CM | POA: Diagnosis not present

## 2018-01-09 DIAGNOSIS — R1310 Dysphagia, unspecified: Secondary | ICD-10-CM | POA: Diagnosis not present

## 2018-01-09 DIAGNOSIS — M6281 Muscle weakness (generalized): Secondary | ICD-10-CM | POA: Diagnosis not present

## 2018-01-09 DIAGNOSIS — R488 Other symbolic dysfunctions: Secondary | ICD-10-CM | POA: Diagnosis not present

## 2018-01-10 DIAGNOSIS — N2581 Secondary hyperparathyroidism of renal origin: Secondary | ICD-10-CM | POA: Diagnosis not present

## 2018-01-10 DIAGNOSIS — N186 End stage renal disease: Secondary | ICD-10-CM | POA: Diagnosis not present

## 2018-01-10 DIAGNOSIS — E119 Type 2 diabetes mellitus without complications: Secondary | ICD-10-CM | POA: Diagnosis not present

## 2018-01-11 DIAGNOSIS — R488 Other symbolic dysfunctions: Secondary | ICD-10-CM | POA: Diagnosis not present

## 2018-01-11 DIAGNOSIS — M6281 Muscle weakness (generalized): Secondary | ICD-10-CM | POA: Diagnosis not present

## 2018-01-11 DIAGNOSIS — R262 Difficulty in walking, not elsewhere classified: Secondary | ICD-10-CM | POA: Diagnosis not present

## 2018-01-11 DIAGNOSIS — I5040 Unspecified combined systolic (congestive) and diastolic (congestive) heart failure: Secondary | ICD-10-CM | POA: Diagnosis not present

## 2018-01-11 DIAGNOSIS — I120 Hypertensive chronic kidney disease with stage 5 chronic kidney disease or end stage renal disease: Secondary | ICD-10-CM | POA: Diagnosis not present

## 2018-01-11 DIAGNOSIS — R1310 Dysphagia, unspecified: Secondary | ICD-10-CM | POA: Diagnosis not present

## 2018-01-11 DIAGNOSIS — I739 Peripheral vascular disease, unspecified: Secondary | ICD-10-CM | POA: Diagnosis not present

## 2018-01-11 DIAGNOSIS — E1142 Type 2 diabetes mellitus with diabetic polyneuropathy: Secondary | ICD-10-CM | POA: Diagnosis not present

## 2018-01-12 DIAGNOSIS — E119 Type 2 diabetes mellitus without complications: Secondary | ICD-10-CM | POA: Diagnosis not present

## 2018-01-12 DIAGNOSIS — N186 End stage renal disease: Secondary | ICD-10-CM | POA: Diagnosis not present

## 2018-01-12 DIAGNOSIS — N2581 Secondary hyperparathyroidism of renal origin: Secondary | ICD-10-CM | POA: Diagnosis not present

## 2018-01-14 DIAGNOSIS — N2581 Secondary hyperparathyroidism of renal origin: Secondary | ICD-10-CM | POA: Diagnosis not present

## 2018-01-14 DIAGNOSIS — I5040 Unspecified combined systolic (congestive) and diastolic (congestive) heart failure: Secondary | ICD-10-CM | POA: Diagnosis not present

## 2018-01-14 DIAGNOSIS — R488 Other symbolic dysfunctions: Secondary | ICD-10-CM | POA: Diagnosis not present

## 2018-01-14 DIAGNOSIS — R1310 Dysphagia, unspecified: Secondary | ICD-10-CM | POA: Diagnosis not present

## 2018-01-14 DIAGNOSIS — N186 End stage renal disease: Secondary | ICD-10-CM | POA: Diagnosis not present

## 2018-01-14 DIAGNOSIS — R262 Difficulty in walking, not elsewhere classified: Secondary | ICD-10-CM | POA: Diagnosis not present

## 2018-01-14 DIAGNOSIS — M6281 Muscle weakness (generalized): Secondary | ICD-10-CM | POA: Diagnosis not present

## 2018-01-14 DIAGNOSIS — E119 Type 2 diabetes mellitus without complications: Secondary | ICD-10-CM | POA: Diagnosis not present

## 2018-01-15 DIAGNOSIS — R488 Other symbolic dysfunctions: Secondary | ICD-10-CM | POA: Diagnosis not present

## 2018-01-15 DIAGNOSIS — R1310 Dysphagia, unspecified: Secondary | ICD-10-CM | POA: Diagnosis not present

## 2018-01-15 DIAGNOSIS — R262 Difficulty in walking, not elsewhere classified: Secondary | ICD-10-CM | POA: Diagnosis not present

## 2018-01-15 DIAGNOSIS — I5040 Unspecified combined systolic (congestive) and diastolic (congestive) heart failure: Secondary | ICD-10-CM | POA: Diagnosis not present

## 2018-01-15 DIAGNOSIS — M6281 Muscle weakness (generalized): Secondary | ICD-10-CM | POA: Diagnosis not present

## 2018-01-17 DIAGNOSIS — N186 End stage renal disease: Secondary | ICD-10-CM | POA: Diagnosis not present

## 2018-01-17 DIAGNOSIS — D631 Anemia in chronic kidney disease: Secondary | ICD-10-CM | POA: Diagnosis not present

## 2018-01-17 DIAGNOSIS — Z992 Dependence on renal dialysis: Secondary | ICD-10-CM | POA: Diagnosis not present

## 2018-01-17 DIAGNOSIS — N2581 Secondary hyperparathyroidism of renal origin: Secondary | ICD-10-CM | POA: Diagnosis not present

## 2018-01-17 DIAGNOSIS — E119 Type 2 diabetes mellitus without complications: Secondary | ICD-10-CM | POA: Diagnosis not present

## 2018-01-17 DIAGNOSIS — E1129 Type 2 diabetes mellitus with other diabetic kidney complication: Secondary | ICD-10-CM | POA: Diagnosis not present

## 2018-01-18 DIAGNOSIS — M6281 Muscle weakness (generalized): Secondary | ICD-10-CM | POA: Diagnosis not present

## 2018-01-18 DIAGNOSIS — F819 Developmental disorder of scholastic skills, unspecified: Secondary | ICD-10-CM | POA: Diagnosis not present

## 2018-01-18 DIAGNOSIS — R262 Difficulty in walking, not elsewhere classified: Secondary | ICD-10-CM | POA: Diagnosis not present

## 2018-01-19 DIAGNOSIS — F819 Developmental disorder of scholastic skills, unspecified: Secondary | ICD-10-CM | POA: Diagnosis not present

## 2018-01-19 DIAGNOSIS — R262 Difficulty in walking, not elsewhere classified: Secondary | ICD-10-CM | POA: Diagnosis not present

## 2018-01-19 DIAGNOSIS — M6281 Muscle weakness (generalized): Secondary | ICD-10-CM | POA: Diagnosis not present

## 2018-01-19 DIAGNOSIS — D631 Anemia in chronic kidney disease: Secondary | ICD-10-CM | POA: Diagnosis not present

## 2018-01-19 DIAGNOSIS — N2581 Secondary hyperparathyroidism of renal origin: Secondary | ICD-10-CM | POA: Diagnosis not present

## 2018-01-19 DIAGNOSIS — E119 Type 2 diabetes mellitus without complications: Secondary | ICD-10-CM | POA: Diagnosis not present

## 2018-01-19 DIAGNOSIS — N186 End stage renal disease: Secondary | ICD-10-CM | POA: Diagnosis not present

## 2018-01-20 DIAGNOSIS — M6281 Muscle weakness (generalized): Secondary | ICD-10-CM | POA: Diagnosis not present

## 2018-01-20 DIAGNOSIS — F819 Developmental disorder of scholastic skills, unspecified: Secondary | ICD-10-CM | POA: Diagnosis not present

## 2018-01-20 DIAGNOSIS — R262 Difficulty in walking, not elsewhere classified: Secondary | ICD-10-CM | POA: Diagnosis not present

## 2018-01-21 DIAGNOSIS — R262 Difficulty in walking, not elsewhere classified: Secondary | ICD-10-CM | POA: Diagnosis not present

## 2018-01-21 DIAGNOSIS — F819 Developmental disorder of scholastic skills, unspecified: Secondary | ICD-10-CM | POA: Diagnosis not present

## 2018-01-21 DIAGNOSIS — E119 Type 2 diabetes mellitus without complications: Secondary | ICD-10-CM | POA: Diagnosis not present

## 2018-01-21 DIAGNOSIS — N186 End stage renal disease: Secondary | ICD-10-CM | POA: Diagnosis not present

## 2018-01-21 DIAGNOSIS — D631 Anemia in chronic kidney disease: Secondary | ICD-10-CM | POA: Diagnosis not present

## 2018-01-21 DIAGNOSIS — M6281 Muscle weakness (generalized): Secondary | ICD-10-CM | POA: Diagnosis not present

## 2018-01-21 DIAGNOSIS — N2581 Secondary hyperparathyroidism of renal origin: Secondary | ICD-10-CM | POA: Diagnosis not present

## 2018-01-24 DIAGNOSIS — E119 Type 2 diabetes mellitus without complications: Secondary | ICD-10-CM | POA: Diagnosis not present

## 2018-01-24 DIAGNOSIS — D631 Anemia in chronic kidney disease: Secondary | ICD-10-CM | POA: Diagnosis not present

## 2018-01-24 DIAGNOSIS — N2581 Secondary hyperparathyroidism of renal origin: Secondary | ICD-10-CM | POA: Diagnosis not present

## 2018-01-24 DIAGNOSIS — N186 End stage renal disease: Secondary | ICD-10-CM | POA: Diagnosis not present

## 2018-01-26 DIAGNOSIS — F819 Developmental disorder of scholastic skills, unspecified: Secondary | ICD-10-CM | POA: Diagnosis not present

## 2018-01-26 DIAGNOSIS — N186 End stage renal disease: Secondary | ICD-10-CM | POA: Diagnosis not present

## 2018-01-26 DIAGNOSIS — D631 Anemia in chronic kidney disease: Secondary | ICD-10-CM | POA: Diagnosis not present

## 2018-01-26 DIAGNOSIS — M6281 Muscle weakness (generalized): Secondary | ICD-10-CM | POA: Diagnosis not present

## 2018-01-26 DIAGNOSIS — E119 Type 2 diabetes mellitus without complications: Secondary | ICD-10-CM | POA: Diagnosis not present

## 2018-01-26 DIAGNOSIS — R262 Difficulty in walking, not elsewhere classified: Secondary | ICD-10-CM | POA: Diagnosis not present

## 2018-01-26 DIAGNOSIS — N2581 Secondary hyperparathyroidism of renal origin: Secondary | ICD-10-CM | POA: Diagnosis not present

## 2018-01-27 DIAGNOSIS — R262 Difficulty in walking, not elsewhere classified: Secondary | ICD-10-CM | POA: Diagnosis not present

## 2018-01-27 DIAGNOSIS — M6281 Muscle weakness (generalized): Secondary | ICD-10-CM | POA: Diagnosis not present

## 2018-01-27 DIAGNOSIS — F819 Developmental disorder of scholastic skills, unspecified: Secondary | ICD-10-CM | POA: Diagnosis not present

## 2018-01-28 DIAGNOSIS — D631 Anemia in chronic kidney disease: Secondary | ICD-10-CM | POA: Diagnosis not present

## 2018-01-28 DIAGNOSIS — M6281 Muscle weakness (generalized): Secondary | ICD-10-CM | POA: Diagnosis not present

## 2018-01-28 DIAGNOSIS — N186 End stage renal disease: Secondary | ICD-10-CM | POA: Diagnosis not present

## 2018-01-28 DIAGNOSIS — F819 Developmental disorder of scholastic skills, unspecified: Secondary | ICD-10-CM | POA: Diagnosis not present

## 2018-01-28 DIAGNOSIS — N2581 Secondary hyperparathyroidism of renal origin: Secondary | ICD-10-CM | POA: Diagnosis not present

## 2018-01-28 DIAGNOSIS — R262 Difficulty in walking, not elsewhere classified: Secondary | ICD-10-CM | POA: Diagnosis not present

## 2018-01-28 DIAGNOSIS — E119 Type 2 diabetes mellitus without complications: Secondary | ICD-10-CM | POA: Diagnosis not present

## 2018-01-29 DIAGNOSIS — F819 Developmental disorder of scholastic skills, unspecified: Secondary | ICD-10-CM | POA: Diagnosis not present

## 2018-01-29 DIAGNOSIS — M6281 Muscle weakness (generalized): Secondary | ICD-10-CM | POA: Diagnosis not present

## 2018-01-29 DIAGNOSIS — R262 Difficulty in walking, not elsewhere classified: Secondary | ICD-10-CM | POA: Diagnosis not present

## 2018-01-31 DIAGNOSIS — N186 End stage renal disease: Secondary | ICD-10-CM | POA: Diagnosis not present

## 2018-01-31 DIAGNOSIS — F819 Developmental disorder of scholastic skills, unspecified: Secondary | ICD-10-CM | POA: Diagnosis not present

## 2018-01-31 DIAGNOSIS — R262 Difficulty in walking, not elsewhere classified: Secondary | ICD-10-CM | POA: Diagnosis not present

## 2018-01-31 DIAGNOSIS — N2581 Secondary hyperparathyroidism of renal origin: Secondary | ICD-10-CM | POA: Diagnosis not present

## 2018-01-31 DIAGNOSIS — M6281 Muscle weakness (generalized): Secondary | ICD-10-CM | POA: Diagnosis not present

## 2018-01-31 DIAGNOSIS — E119 Type 2 diabetes mellitus without complications: Secondary | ICD-10-CM | POA: Diagnosis not present

## 2018-01-31 DIAGNOSIS — D631 Anemia in chronic kidney disease: Secondary | ICD-10-CM | POA: Diagnosis not present

## 2018-02-01 DIAGNOSIS — M6281 Muscle weakness (generalized): Secondary | ICD-10-CM | POA: Diagnosis not present

## 2018-02-01 DIAGNOSIS — R262 Difficulty in walking, not elsewhere classified: Secondary | ICD-10-CM | POA: Diagnosis not present

## 2018-02-01 DIAGNOSIS — F819 Developmental disorder of scholastic skills, unspecified: Secondary | ICD-10-CM | POA: Diagnosis not present

## 2018-02-02 DIAGNOSIS — E119 Type 2 diabetes mellitus without complications: Secondary | ICD-10-CM | POA: Diagnosis not present

## 2018-02-02 DIAGNOSIS — N186 End stage renal disease: Secondary | ICD-10-CM | POA: Diagnosis not present

## 2018-02-02 DIAGNOSIS — D631 Anemia in chronic kidney disease: Secondary | ICD-10-CM | POA: Diagnosis not present

## 2018-02-02 DIAGNOSIS — N2581 Secondary hyperparathyroidism of renal origin: Secondary | ICD-10-CM | POA: Diagnosis not present

## 2018-02-04 DIAGNOSIS — N2581 Secondary hyperparathyroidism of renal origin: Secondary | ICD-10-CM | POA: Diagnosis not present

## 2018-02-04 DIAGNOSIS — D631 Anemia in chronic kidney disease: Secondary | ICD-10-CM | POA: Diagnosis not present

## 2018-02-04 DIAGNOSIS — E119 Type 2 diabetes mellitus without complications: Secondary | ICD-10-CM | POA: Diagnosis not present

## 2018-02-04 DIAGNOSIS — R262 Difficulty in walking, not elsewhere classified: Secondary | ICD-10-CM | POA: Diagnosis not present

## 2018-02-04 DIAGNOSIS — N186 End stage renal disease: Secondary | ICD-10-CM | POA: Diagnosis not present

## 2018-02-04 DIAGNOSIS — F819 Developmental disorder of scholastic skills, unspecified: Secondary | ICD-10-CM | POA: Diagnosis not present

## 2018-02-04 DIAGNOSIS — M6281 Muscle weakness (generalized): Secondary | ICD-10-CM | POA: Diagnosis not present

## 2018-02-05 DIAGNOSIS — I5040 Unspecified combined systolic (congestive) and diastolic (congestive) heart failure: Secondary | ICD-10-CM | POA: Diagnosis not present

## 2018-02-05 DIAGNOSIS — E1142 Type 2 diabetes mellitus with diabetic polyneuropathy: Secondary | ICD-10-CM | POA: Diagnosis not present

## 2018-02-05 DIAGNOSIS — K219 Gastro-esophageal reflux disease without esophagitis: Secondary | ICD-10-CM | POA: Diagnosis not present

## 2018-02-05 DIAGNOSIS — I10 Essential (primary) hypertension: Secondary | ICD-10-CM | POA: Diagnosis not present

## 2018-02-07 DIAGNOSIS — N186 End stage renal disease: Secondary | ICD-10-CM | POA: Diagnosis not present

## 2018-02-07 DIAGNOSIS — E119 Type 2 diabetes mellitus without complications: Secondary | ICD-10-CM | POA: Diagnosis not present

## 2018-02-07 DIAGNOSIS — N2581 Secondary hyperparathyroidism of renal origin: Secondary | ICD-10-CM | POA: Diagnosis not present

## 2018-02-07 DIAGNOSIS — D631 Anemia in chronic kidney disease: Secondary | ICD-10-CM | POA: Diagnosis not present

## 2018-02-08 DIAGNOSIS — R262 Difficulty in walking, not elsewhere classified: Secondary | ICD-10-CM | POA: Diagnosis not present

## 2018-02-08 DIAGNOSIS — F819 Developmental disorder of scholastic skills, unspecified: Secondary | ICD-10-CM | POA: Diagnosis not present

## 2018-02-08 DIAGNOSIS — M6281 Muscle weakness (generalized): Secondary | ICD-10-CM | POA: Diagnosis not present

## 2018-02-09 DIAGNOSIS — M6281 Muscle weakness (generalized): Secondary | ICD-10-CM | POA: Diagnosis not present

## 2018-02-09 DIAGNOSIS — R262 Difficulty in walking, not elsewhere classified: Secondary | ICD-10-CM | POA: Diagnosis not present

## 2018-02-09 DIAGNOSIS — I5040 Unspecified combined systolic (congestive) and diastolic (congestive) heart failure: Secondary | ICD-10-CM | POA: Diagnosis not present

## 2018-02-09 DIAGNOSIS — F819 Developmental disorder of scholastic skills, unspecified: Secondary | ICD-10-CM | POA: Diagnosis not present

## 2018-02-09 DIAGNOSIS — N186 End stage renal disease: Secondary | ICD-10-CM | POA: Diagnosis not present

## 2018-02-09 DIAGNOSIS — I739 Peripheral vascular disease, unspecified: Secondary | ICD-10-CM | POA: Diagnosis not present

## 2018-02-09 DIAGNOSIS — I120 Hypertensive chronic kidney disease with stage 5 chronic kidney disease or end stage renal disease: Secondary | ICD-10-CM | POA: Diagnosis not present

## 2018-02-10 DIAGNOSIS — M6281 Muscle weakness (generalized): Secondary | ICD-10-CM | POA: Diagnosis not present

## 2018-02-10 DIAGNOSIS — F819 Developmental disorder of scholastic skills, unspecified: Secondary | ICD-10-CM | POA: Diagnosis not present

## 2018-02-10 DIAGNOSIS — R262 Difficulty in walking, not elsewhere classified: Secondary | ICD-10-CM | POA: Diagnosis not present

## 2018-02-11 DIAGNOSIS — M6281 Muscle weakness (generalized): Secondary | ICD-10-CM | POA: Diagnosis not present

## 2018-02-11 DIAGNOSIS — D631 Anemia in chronic kidney disease: Secondary | ICD-10-CM | POA: Diagnosis not present

## 2018-02-11 DIAGNOSIS — N186 End stage renal disease: Secondary | ICD-10-CM | POA: Diagnosis not present

## 2018-02-11 DIAGNOSIS — E119 Type 2 diabetes mellitus without complications: Secondary | ICD-10-CM | POA: Diagnosis not present

## 2018-02-11 DIAGNOSIS — R262 Difficulty in walking, not elsewhere classified: Secondary | ICD-10-CM | POA: Diagnosis not present

## 2018-02-11 DIAGNOSIS — F819 Developmental disorder of scholastic skills, unspecified: Secondary | ICD-10-CM | POA: Diagnosis not present

## 2018-02-11 DIAGNOSIS — N2581 Secondary hyperparathyroidism of renal origin: Secondary | ICD-10-CM | POA: Diagnosis not present

## 2018-02-12 DIAGNOSIS — M6281 Muscle weakness (generalized): Secondary | ICD-10-CM | POA: Diagnosis not present

## 2018-02-12 DIAGNOSIS — F819 Developmental disorder of scholastic skills, unspecified: Secondary | ICD-10-CM | POA: Diagnosis not present

## 2018-02-12 DIAGNOSIS — R262 Difficulty in walking, not elsewhere classified: Secondary | ICD-10-CM | POA: Diagnosis not present

## 2018-02-14 DIAGNOSIS — M6281 Muscle weakness (generalized): Secondary | ICD-10-CM | POA: Diagnosis not present

## 2018-02-14 DIAGNOSIS — E119 Type 2 diabetes mellitus without complications: Secondary | ICD-10-CM | POA: Diagnosis not present

## 2018-02-14 DIAGNOSIS — F819 Developmental disorder of scholastic skills, unspecified: Secondary | ICD-10-CM | POA: Diagnosis not present

## 2018-02-14 DIAGNOSIS — N2581 Secondary hyperparathyroidism of renal origin: Secondary | ICD-10-CM | POA: Diagnosis not present

## 2018-02-14 DIAGNOSIS — N186 End stage renal disease: Secondary | ICD-10-CM | POA: Diagnosis not present

## 2018-02-14 DIAGNOSIS — R262 Difficulty in walking, not elsewhere classified: Secondary | ICD-10-CM | POA: Diagnosis not present

## 2018-02-14 DIAGNOSIS — D631 Anemia in chronic kidney disease: Secondary | ICD-10-CM | POA: Diagnosis not present

## 2018-02-15 DIAGNOSIS — R262 Difficulty in walking, not elsewhere classified: Secondary | ICD-10-CM | POA: Diagnosis not present

## 2018-02-15 DIAGNOSIS — M6281 Muscle weakness (generalized): Secondary | ICD-10-CM | POA: Diagnosis not present

## 2018-02-15 DIAGNOSIS — F819 Developmental disorder of scholastic skills, unspecified: Secondary | ICD-10-CM | POA: Diagnosis not present

## 2018-02-16 DIAGNOSIS — D631 Anemia in chronic kidney disease: Secondary | ICD-10-CM | POA: Diagnosis not present

## 2018-02-16 DIAGNOSIS — F819 Developmental disorder of scholastic skills, unspecified: Secondary | ICD-10-CM | POA: Diagnosis not present

## 2018-02-16 DIAGNOSIS — E119 Type 2 diabetes mellitus without complications: Secondary | ICD-10-CM | POA: Diagnosis not present

## 2018-02-16 DIAGNOSIS — R262 Difficulty in walking, not elsewhere classified: Secondary | ICD-10-CM | POA: Diagnosis not present

## 2018-02-16 DIAGNOSIS — N186 End stage renal disease: Secondary | ICD-10-CM | POA: Diagnosis not present

## 2018-02-16 DIAGNOSIS — N2581 Secondary hyperparathyroidism of renal origin: Secondary | ICD-10-CM | POA: Diagnosis not present

## 2018-02-16 DIAGNOSIS — M6281 Muscle weakness (generalized): Secondary | ICD-10-CM | POA: Diagnosis not present

## 2018-02-17 DIAGNOSIS — M6281 Muscle weakness (generalized): Secondary | ICD-10-CM | POA: Diagnosis not present

## 2018-02-17 DIAGNOSIS — E1129 Type 2 diabetes mellitus with other diabetic kidney complication: Secondary | ICD-10-CM | POA: Diagnosis not present

## 2018-02-17 DIAGNOSIS — Z992 Dependence on renal dialysis: Secondary | ICD-10-CM | POA: Diagnosis not present

## 2018-02-17 DIAGNOSIS — R262 Difficulty in walking, not elsewhere classified: Secondary | ICD-10-CM | POA: Diagnosis not present

## 2018-02-17 DIAGNOSIS — N186 End stage renal disease: Secondary | ICD-10-CM | POA: Diagnosis not present

## 2018-02-18 DIAGNOSIS — M6281 Muscle weakness (generalized): Secondary | ICD-10-CM | POA: Diagnosis not present

## 2018-02-18 DIAGNOSIS — N2581 Secondary hyperparathyroidism of renal origin: Secondary | ICD-10-CM | POA: Diagnosis not present

## 2018-02-18 DIAGNOSIS — D631 Anemia in chronic kidney disease: Secondary | ICD-10-CM | POA: Diagnosis not present

## 2018-02-18 DIAGNOSIS — R262 Difficulty in walking, not elsewhere classified: Secondary | ICD-10-CM | POA: Diagnosis not present

## 2018-02-18 DIAGNOSIS — E119 Type 2 diabetes mellitus without complications: Secondary | ICD-10-CM | POA: Diagnosis not present

## 2018-02-18 DIAGNOSIS — N186 End stage renal disease: Secondary | ICD-10-CM | POA: Diagnosis not present

## 2018-02-19 DIAGNOSIS — R262 Difficulty in walking, not elsewhere classified: Secondary | ICD-10-CM | POA: Diagnosis not present

## 2018-02-19 DIAGNOSIS — M6281 Muscle weakness (generalized): Secondary | ICD-10-CM | POA: Diagnosis not present

## 2018-02-21 DIAGNOSIS — D631 Anemia in chronic kidney disease: Secondary | ICD-10-CM | POA: Diagnosis not present

## 2018-02-21 DIAGNOSIS — N2581 Secondary hyperparathyroidism of renal origin: Secondary | ICD-10-CM | POA: Diagnosis not present

## 2018-02-21 DIAGNOSIS — R262 Difficulty in walking, not elsewhere classified: Secondary | ICD-10-CM | POA: Diagnosis not present

## 2018-02-21 DIAGNOSIS — N186 End stage renal disease: Secondary | ICD-10-CM | POA: Diagnosis not present

## 2018-02-21 DIAGNOSIS — E119 Type 2 diabetes mellitus without complications: Secondary | ICD-10-CM | POA: Diagnosis not present

## 2018-02-21 DIAGNOSIS — M6281 Muscle weakness (generalized): Secondary | ICD-10-CM | POA: Diagnosis not present

## 2018-02-22 DIAGNOSIS — M6281 Muscle weakness (generalized): Secondary | ICD-10-CM | POA: Diagnosis not present

## 2018-02-22 DIAGNOSIS — R262 Difficulty in walking, not elsewhere classified: Secondary | ICD-10-CM | POA: Diagnosis not present

## 2018-02-23 DIAGNOSIS — R262 Difficulty in walking, not elsewhere classified: Secondary | ICD-10-CM | POA: Diagnosis not present

## 2018-02-23 DIAGNOSIS — E119 Type 2 diabetes mellitus without complications: Secondary | ICD-10-CM | POA: Diagnosis not present

## 2018-02-23 DIAGNOSIS — N186 End stage renal disease: Secondary | ICD-10-CM | POA: Diagnosis not present

## 2018-02-23 DIAGNOSIS — M6281 Muscle weakness (generalized): Secondary | ICD-10-CM | POA: Diagnosis not present

## 2018-02-23 DIAGNOSIS — D631 Anemia in chronic kidney disease: Secondary | ICD-10-CM | POA: Diagnosis not present

## 2018-02-23 DIAGNOSIS — N2581 Secondary hyperparathyroidism of renal origin: Secondary | ICD-10-CM | POA: Diagnosis not present

## 2018-02-24 DIAGNOSIS — M6281 Muscle weakness (generalized): Secondary | ICD-10-CM | POA: Diagnosis not present

## 2018-02-24 DIAGNOSIS — R262 Difficulty in walking, not elsewhere classified: Secondary | ICD-10-CM | POA: Diagnosis not present

## 2018-02-25 DIAGNOSIS — N2581 Secondary hyperparathyroidism of renal origin: Secondary | ICD-10-CM | POA: Diagnosis not present

## 2018-02-25 DIAGNOSIS — N186 End stage renal disease: Secondary | ICD-10-CM | POA: Diagnosis not present

## 2018-02-25 DIAGNOSIS — M6281 Muscle weakness (generalized): Secondary | ICD-10-CM | POA: Diagnosis not present

## 2018-02-25 DIAGNOSIS — D631 Anemia in chronic kidney disease: Secondary | ICD-10-CM | POA: Diagnosis not present

## 2018-02-25 DIAGNOSIS — E119 Type 2 diabetes mellitus without complications: Secondary | ICD-10-CM | POA: Diagnosis not present

## 2018-02-25 DIAGNOSIS — R262 Difficulty in walking, not elsewhere classified: Secondary | ICD-10-CM | POA: Diagnosis not present

## 2018-02-28 DIAGNOSIS — N2581 Secondary hyperparathyroidism of renal origin: Secondary | ICD-10-CM | POA: Diagnosis not present

## 2018-02-28 DIAGNOSIS — N186 End stage renal disease: Secondary | ICD-10-CM | POA: Diagnosis not present

## 2018-02-28 DIAGNOSIS — D631 Anemia in chronic kidney disease: Secondary | ICD-10-CM | POA: Diagnosis not present

## 2018-02-28 DIAGNOSIS — E119 Type 2 diabetes mellitus without complications: Secondary | ICD-10-CM | POA: Diagnosis not present

## 2018-02-28 DIAGNOSIS — R262 Difficulty in walking, not elsewhere classified: Secondary | ICD-10-CM | POA: Diagnosis not present

## 2018-02-28 DIAGNOSIS — M6281 Muscle weakness (generalized): Secondary | ICD-10-CM | POA: Diagnosis not present

## 2018-03-01 DIAGNOSIS — M6281 Muscle weakness (generalized): Secondary | ICD-10-CM | POA: Diagnosis not present

## 2018-03-01 DIAGNOSIS — R262 Difficulty in walking, not elsewhere classified: Secondary | ICD-10-CM | POA: Diagnosis not present

## 2018-03-02 DIAGNOSIS — E119 Type 2 diabetes mellitus without complications: Secondary | ICD-10-CM | POA: Diagnosis not present

## 2018-03-02 DIAGNOSIS — D631 Anemia in chronic kidney disease: Secondary | ICD-10-CM | POA: Diagnosis not present

## 2018-03-02 DIAGNOSIS — N2581 Secondary hyperparathyroidism of renal origin: Secondary | ICD-10-CM | POA: Diagnosis not present

## 2018-03-02 DIAGNOSIS — N186 End stage renal disease: Secondary | ICD-10-CM | POA: Diagnosis not present

## 2018-03-02 DIAGNOSIS — M6281 Muscle weakness (generalized): Secondary | ICD-10-CM | POA: Diagnosis not present

## 2018-03-02 DIAGNOSIS — R262 Difficulty in walking, not elsewhere classified: Secondary | ICD-10-CM | POA: Diagnosis not present

## 2018-03-03 DIAGNOSIS — R262 Difficulty in walking, not elsewhere classified: Secondary | ICD-10-CM | POA: Diagnosis not present

## 2018-03-03 DIAGNOSIS — M6281 Muscle weakness (generalized): Secondary | ICD-10-CM | POA: Diagnosis not present

## 2018-03-04 DIAGNOSIS — D631 Anemia in chronic kidney disease: Secondary | ICD-10-CM | POA: Diagnosis not present

## 2018-03-04 DIAGNOSIS — I739 Peripheral vascular disease, unspecified: Secondary | ICD-10-CM | POA: Diagnosis not present

## 2018-03-04 DIAGNOSIS — N2581 Secondary hyperparathyroidism of renal origin: Secondary | ICD-10-CM | POA: Diagnosis not present

## 2018-03-04 DIAGNOSIS — E119 Type 2 diabetes mellitus without complications: Secondary | ICD-10-CM | POA: Diagnosis not present

## 2018-03-04 DIAGNOSIS — I120 Hypertensive chronic kidney disease with stage 5 chronic kidney disease or end stage renal disease: Secondary | ICD-10-CM | POA: Diagnosis not present

## 2018-03-04 DIAGNOSIS — R262 Difficulty in walking, not elsewhere classified: Secondary | ICD-10-CM | POA: Diagnosis not present

## 2018-03-04 DIAGNOSIS — N186 End stage renal disease: Secondary | ICD-10-CM | POA: Diagnosis not present

## 2018-03-04 DIAGNOSIS — I5040 Unspecified combined systolic (congestive) and diastolic (congestive) heart failure: Secondary | ICD-10-CM | POA: Diagnosis not present

## 2018-03-04 DIAGNOSIS — M6281 Muscle weakness (generalized): Secondary | ICD-10-CM | POA: Diagnosis not present

## 2018-03-04 DIAGNOSIS — E1142 Type 2 diabetes mellitus with diabetic polyneuropathy: Secondary | ICD-10-CM | POA: Diagnosis not present

## 2018-03-05 DIAGNOSIS — M6281 Muscle weakness (generalized): Secondary | ICD-10-CM | POA: Diagnosis not present

## 2018-03-05 DIAGNOSIS — R262 Difficulty in walking, not elsewhere classified: Secondary | ICD-10-CM | POA: Diagnosis not present

## 2018-03-06 DIAGNOSIS — I10 Essential (primary) hypertension: Secondary | ICD-10-CM | POA: Diagnosis not present

## 2018-03-06 DIAGNOSIS — I5040 Unspecified combined systolic (congestive) and diastolic (congestive) heart failure: Secondary | ICD-10-CM | POA: Diagnosis not present

## 2018-03-06 DIAGNOSIS — E1142 Type 2 diabetes mellitus with diabetic polyneuropathy: Secondary | ICD-10-CM | POA: Diagnosis not present

## 2018-03-06 DIAGNOSIS — K219 Gastro-esophageal reflux disease without esophagitis: Secondary | ICD-10-CM | POA: Diagnosis not present

## 2018-03-07 DIAGNOSIS — D631 Anemia in chronic kidney disease: Secondary | ICD-10-CM | POA: Diagnosis not present

## 2018-03-07 DIAGNOSIS — N2581 Secondary hyperparathyroidism of renal origin: Secondary | ICD-10-CM | POA: Diagnosis not present

## 2018-03-07 DIAGNOSIS — M6281 Muscle weakness (generalized): Secondary | ICD-10-CM | POA: Diagnosis not present

## 2018-03-07 DIAGNOSIS — F0390 Unspecified dementia without behavioral disturbance: Secondary | ICD-10-CM | POA: Diagnosis not present

## 2018-03-07 DIAGNOSIS — E7800 Pure hypercholesterolemia, unspecified: Secondary | ICD-10-CM | POA: Diagnosis not present

## 2018-03-07 DIAGNOSIS — R279 Unspecified lack of coordination: Secondary | ICD-10-CM | POA: Diagnosis not present

## 2018-03-07 DIAGNOSIS — R262 Difficulty in walking, not elsewhere classified: Secondary | ICD-10-CM | POA: Diagnosis not present

## 2018-03-07 DIAGNOSIS — E119 Type 2 diabetes mellitus without complications: Secondary | ICD-10-CM | POA: Diagnosis not present

## 2018-03-07 DIAGNOSIS — R05 Cough: Secondary | ICD-10-CM | POA: Diagnosis not present

## 2018-03-07 DIAGNOSIS — I509 Heart failure, unspecified: Secondary | ICD-10-CM | POA: Diagnosis not present

## 2018-03-07 DIAGNOSIS — I110 Hypertensive heart disease with heart failure: Secondary | ICD-10-CM | POA: Diagnosis not present

## 2018-03-07 DIAGNOSIS — N186 End stage renal disease: Secondary | ICD-10-CM | POA: Diagnosis not present

## 2018-03-07 DIAGNOSIS — R404 Transient alteration of awareness: Secondary | ICD-10-CM | POA: Diagnosis not present

## 2018-03-07 DIAGNOSIS — R0789 Other chest pain: Secondary | ICD-10-CM | POA: Diagnosis not present

## 2018-03-07 DIAGNOSIS — R410 Disorientation, unspecified: Secondary | ICD-10-CM | POA: Diagnosis not present

## 2018-03-07 DIAGNOSIS — Z743 Need for continuous supervision: Secondary | ICD-10-CM | POA: Diagnosis not present

## 2018-03-07 DIAGNOSIS — R079 Chest pain, unspecified: Secondary | ICD-10-CM | POA: Diagnosis not present

## 2018-03-08 DIAGNOSIS — R262 Difficulty in walking, not elsewhere classified: Secondary | ICD-10-CM | POA: Diagnosis not present

## 2018-03-08 DIAGNOSIS — M6281 Muscle weakness (generalized): Secondary | ICD-10-CM | POA: Diagnosis not present

## 2018-03-09 DIAGNOSIS — N2581 Secondary hyperparathyroidism of renal origin: Secondary | ICD-10-CM | POA: Diagnosis not present

## 2018-03-09 DIAGNOSIS — E119 Type 2 diabetes mellitus without complications: Secondary | ICD-10-CM | POA: Diagnosis not present

## 2018-03-09 DIAGNOSIS — N186 End stage renal disease: Secondary | ICD-10-CM | POA: Diagnosis not present

## 2018-03-09 DIAGNOSIS — D631 Anemia in chronic kidney disease: Secondary | ICD-10-CM | POA: Diagnosis not present

## 2018-03-11 DIAGNOSIS — N186 End stage renal disease: Secondary | ICD-10-CM | POA: Diagnosis not present

## 2018-03-11 DIAGNOSIS — E119 Type 2 diabetes mellitus without complications: Secondary | ICD-10-CM | POA: Diagnosis not present

## 2018-03-11 DIAGNOSIS — D631 Anemia in chronic kidney disease: Secondary | ICD-10-CM | POA: Diagnosis not present

## 2018-03-11 DIAGNOSIS — N2581 Secondary hyperparathyroidism of renal origin: Secondary | ICD-10-CM | POA: Diagnosis not present

## 2018-03-14 DIAGNOSIS — R109 Unspecified abdominal pain: Secondary | ICD-10-CM | POA: Diagnosis not present

## 2018-03-14 DIAGNOSIS — E119 Type 2 diabetes mellitus without complications: Secondary | ICD-10-CM | POA: Diagnosis not present

## 2018-03-14 DIAGNOSIS — N186 End stage renal disease: Secondary | ICD-10-CM | POA: Diagnosis not present

## 2018-03-14 DIAGNOSIS — D631 Anemia in chronic kidney disease: Secondary | ICD-10-CM | POA: Diagnosis not present

## 2018-03-14 DIAGNOSIS — N2581 Secondary hyperparathyroidism of renal origin: Secondary | ICD-10-CM | POA: Diagnosis not present

## 2018-03-16 DIAGNOSIS — E119 Type 2 diabetes mellitus without complications: Secondary | ICD-10-CM | POA: Diagnosis not present

## 2018-03-16 DIAGNOSIS — D631 Anemia in chronic kidney disease: Secondary | ICD-10-CM | POA: Diagnosis not present

## 2018-03-16 DIAGNOSIS — N2581 Secondary hyperparathyroidism of renal origin: Secondary | ICD-10-CM | POA: Diagnosis not present

## 2018-03-16 DIAGNOSIS — N186 End stage renal disease: Secondary | ICD-10-CM | POA: Diagnosis not present

## 2018-03-18 DIAGNOSIS — N186 End stage renal disease: Secondary | ICD-10-CM | POA: Diagnosis not present

## 2018-03-18 DIAGNOSIS — D631 Anemia in chronic kidney disease: Secondary | ICD-10-CM | POA: Diagnosis not present

## 2018-03-18 DIAGNOSIS — E119 Type 2 diabetes mellitus without complications: Secondary | ICD-10-CM | POA: Diagnosis not present

## 2018-03-18 DIAGNOSIS — N2581 Secondary hyperparathyroidism of renal origin: Secondary | ICD-10-CM | POA: Diagnosis not present

## 2018-03-20 DIAGNOSIS — Z992 Dependence on renal dialysis: Secondary | ICD-10-CM | POA: Diagnosis not present

## 2018-03-20 DIAGNOSIS — N186 End stage renal disease: Secondary | ICD-10-CM | POA: Diagnosis not present

## 2018-03-20 DIAGNOSIS — E1129 Type 2 diabetes mellitus with other diabetic kidney complication: Secondary | ICD-10-CM | POA: Diagnosis not present

## 2018-03-21 DIAGNOSIS — N186 End stage renal disease: Secondary | ICD-10-CM | POA: Diagnosis not present

## 2018-03-21 DIAGNOSIS — E119 Type 2 diabetes mellitus without complications: Secondary | ICD-10-CM | POA: Diagnosis not present

## 2018-03-21 DIAGNOSIS — N2581 Secondary hyperparathyroidism of renal origin: Secondary | ICD-10-CM | POA: Diagnosis not present

## 2018-03-21 DIAGNOSIS — D631 Anemia in chronic kidney disease: Secondary | ICD-10-CM | POA: Diagnosis not present

## 2018-03-22 DIAGNOSIS — I871 Compression of vein: Secondary | ICD-10-CM | POA: Diagnosis not present

## 2018-03-22 DIAGNOSIS — N186 End stage renal disease: Secondary | ICD-10-CM | POA: Diagnosis not present

## 2018-03-22 DIAGNOSIS — T82898A Other specified complication of vascular prosthetic devices, implants and grafts, initial encounter: Secondary | ICD-10-CM | POA: Diagnosis not present

## 2018-03-22 DIAGNOSIS — Z992 Dependence on renal dialysis: Secondary | ICD-10-CM | POA: Diagnosis not present

## 2018-03-23 DIAGNOSIS — D631 Anemia in chronic kidney disease: Secondary | ICD-10-CM | POA: Diagnosis not present

## 2018-03-23 DIAGNOSIS — N2581 Secondary hyperparathyroidism of renal origin: Secondary | ICD-10-CM | POA: Diagnosis not present

## 2018-03-23 DIAGNOSIS — E119 Type 2 diabetes mellitus without complications: Secondary | ICD-10-CM | POA: Diagnosis not present

## 2018-03-23 DIAGNOSIS — N186 End stage renal disease: Secondary | ICD-10-CM | POA: Diagnosis not present

## 2018-03-25 DIAGNOSIS — N2581 Secondary hyperparathyroidism of renal origin: Secondary | ICD-10-CM | POA: Diagnosis not present

## 2018-03-25 DIAGNOSIS — E119 Type 2 diabetes mellitus without complications: Secondary | ICD-10-CM | POA: Diagnosis not present

## 2018-03-25 DIAGNOSIS — N186 End stage renal disease: Secondary | ICD-10-CM | POA: Diagnosis not present

## 2018-03-25 DIAGNOSIS — D631 Anemia in chronic kidney disease: Secondary | ICD-10-CM | POA: Diagnosis not present

## 2018-03-28 DIAGNOSIS — N2581 Secondary hyperparathyroidism of renal origin: Secondary | ICD-10-CM | POA: Diagnosis not present

## 2018-03-28 DIAGNOSIS — D631 Anemia in chronic kidney disease: Secondary | ICD-10-CM | POA: Diagnosis not present

## 2018-03-28 DIAGNOSIS — E119 Type 2 diabetes mellitus without complications: Secondary | ICD-10-CM | POA: Diagnosis not present

## 2018-03-28 DIAGNOSIS — N186 End stage renal disease: Secondary | ICD-10-CM | POA: Diagnosis not present

## 2018-03-30 DIAGNOSIS — E119 Type 2 diabetes mellitus without complications: Secondary | ICD-10-CM | POA: Diagnosis not present

## 2018-03-30 DIAGNOSIS — D631 Anemia in chronic kidney disease: Secondary | ICD-10-CM | POA: Diagnosis not present

## 2018-03-30 DIAGNOSIS — N186 End stage renal disease: Secondary | ICD-10-CM | POA: Diagnosis not present

## 2018-03-30 DIAGNOSIS — N2581 Secondary hyperparathyroidism of renal origin: Secondary | ICD-10-CM | POA: Diagnosis not present

## 2018-04-01 DIAGNOSIS — D631 Anemia in chronic kidney disease: Secondary | ICD-10-CM | POA: Diagnosis not present

## 2018-04-01 DIAGNOSIS — N2581 Secondary hyperparathyroidism of renal origin: Secondary | ICD-10-CM | POA: Diagnosis not present

## 2018-04-01 DIAGNOSIS — N186 End stage renal disease: Secondary | ICD-10-CM | POA: Diagnosis not present

## 2018-04-01 DIAGNOSIS — E119 Type 2 diabetes mellitus without complications: Secondary | ICD-10-CM | POA: Diagnosis not present

## 2018-04-04 DIAGNOSIS — M6281 Muscle weakness (generalized): Secondary | ICD-10-CM | POA: Diagnosis not present

## 2018-04-04 DIAGNOSIS — N186 End stage renal disease: Secondary | ICD-10-CM | POA: Diagnosis not present

## 2018-04-04 DIAGNOSIS — I739 Peripheral vascular disease, unspecified: Secondary | ICD-10-CM | POA: Diagnosis not present

## 2018-04-04 DIAGNOSIS — N2581 Secondary hyperparathyroidism of renal origin: Secondary | ICD-10-CM | POA: Diagnosis not present

## 2018-04-04 DIAGNOSIS — F0151 Vascular dementia with behavioral disturbance: Secondary | ICD-10-CM | POA: Diagnosis not present

## 2018-04-04 DIAGNOSIS — R262 Difficulty in walking, not elsewhere classified: Secondary | ICD-10-CM | POA: Diagnosis not present

## 2018-04-04 DIAGNOSIS — I5040 Unspecified combined systolic (congestive) and diastolic (congestive) heart failure: Secondary | ICD-10-CM | POA: Diagnosis not present

## 2018-04-04 DIAGNOSIS — E119 Type 2 diabetes mellitus without complications: Secondary | ICD-10-CM | POA: Diagnosis not present

## 2018-04-04 DIAGNOSIS — D631 Anemia in chronic kidney disease: Secondary | ICD-10-CM | POA: Diagnosis not present

## 2018-04-04 DIAGNOSIS — I120 Hypertensive chronic kidney disease with stage 5 chronic kidney disease or end stage renal disease: Secondary | ICD-10-CM | POA: Diagnosis not present

## 2018-04-05 DIAGNOSIS — E039 Hypothyroidism, unspecified: Secondary | ICD-10-CM | POA: Diagnosis not present

## 2018-04-05 DIAGNOSIS — E785 Hyperlipidemia, unspecified: Secondary | ICD-10-CM | POA: Diagnosis not present

## 2018-04-05 DIAGNOSIS — R7989 Other specified abnormal findings of blood chemistry: Secondary | ICD-10-CM | POA: Diagnosis not present

## 2018-04-05 DIAGNOSIS — E119 Type 2 diabetes mellitus without complications: Secondary | ICD-10-CM | POA: Diagnosis not present

## 2018-04-05 DIAGNOSIS — E559 Vitamin D deficiency, unspecified: Secondary | ICD-10-CM | POA: Diagnosis not present

## 2018-04-06 DIAGNOSIS — N2581 Secondary hyperparathyroidism of renal origin: Secondary | ICD-10-CM | POA: Diagnosis not present

## 2018-04-06 DIAGNOSIS — D631 Anemia in chronic kidney disease: Secondary | ICD-10-CM | POA: Diagnosis not present

## 2018-04-06 DIAGNOSIS — N186 End stage renal disease: Secondary | ICD-10-CM | POA: Diagnosis not present

## 2018-04-06 DIAGNOSIS — E119 Type 2 diabetes mellitus without complications: Secondary | ICD-10-CM | POA: Diagnosis not present

## 2018-04-08 DIAGNOSIS — N186 End stage renal disease: Secondary | ICD-10-CM | POA: Diagnosis not present

## 2018-04-08 DIAGNOSIS — N2581 Secondary hyperparathyroidism of renal origin: Secondary | ICD-10-CM | POA: Diagnosis not present

## 2018-04-08 DIAGNOSIS — D631 Anemia in chronic kidney disease: Secondary | ICD-10-CM | POA: Diagnosis not present

## 2018-04-08 DIAGNOSIS — E119 Type 2 diabetes mellitus without complications: Secondary | ICD-10-CM | POA: Diagnosis not present

## 2018-04-11 DIAGNOSIS — D631 Anemia in chronic kidney disease: Secondary | ICD-10-CM | POA: Diagnosis not present

## 2018-04-11 DIAGNOSIS — E119 Type 2 diabetes mellitus without complications: Secondary | ICD-10-CM | POA: Diagnosis not present

## 2018-04-11 DIAGNOSIS — N2581 Secondary hyperparathyroidism of renal origin: Secondary | ICD-10-CM | POA: Diagnosis not present

## 2018-04-11 DIAGNOSIS — N186 End stage renal disease: Secondary | ICD-10-CM | POA: Diagnosis not present

## 2018-04-12 DIAGNOSIS — Z992 Dependence on renal dialysis: Secondary | ICD-10-CM | POA: Diagnosis not present

## 2018-04-12 DIAGNOSIS — N186 End stage renal disease: Secondary | ICD-10-CM | POA: Diagnosis not present

## 2018-04-12 DIAGNOSIS — T82858A Stenosis of vascular prosthetic devices, implants and grafts, initial encounter: Secondary | ICD-10-CM | POA: Diagnosis not present

## 2018-04-12 DIAGNOSIS — I771 Stricture of artery: Secondary | ICD-10-CM | POA: Diagnosis not present

## 2018-04-13 DIAGNOSIS — N2581 Secondary hyperparathyroidism of renal origin: Secondary | ICD-10-CM | POA: Diagnosis not present

## 2018-04-13 DIAGNOSIS — E119 Type 2 diabetes mellitus without complications: Secondary | ICD-10-CM | POA: Diagnosis not present

## 2018-04-13 DIAGNOSIS — D631 Anemia in chronic kidney disease: Secondary | ICD-10-CM | POA: Diagnosis not present

## 2018-04-13 DIAGNOSIS — N186 End stage renal disease: Secondary | ICD-10-CM | POA: Diagnosis not present

## 2018-04-15 DIAGNOSIS — E119 Type 2 diabetes mellitus without complications: Secondary | ICD-10-CM | POA: Diagnosis not present

## 2018-04-15 DIAGNOSIS — D631 Anemia in chronic kidney disease: Secondary | ICD-10-CM | POA: Diagnosis not present

## 2018-04-15 DIAGNOSIS — N186 End stage renal disease: Secondary | ICD-10-CM | POA: Diagnosis not present

## 2018-04-15 DIAGNOSIS — N2581 Secondary hyperparathyroidism of renal origin: Secondary | ICD-10-CM | POA: Diagnosis not present

## 2018-04-18 DIAGNOSIS — E119 Type 2 diabetes mellitus without complications: Secondary | ICD-10-CM | POA: Diagnosis not present

## 2018-04-18 DIAGNOSIS — N186 End stage renal disease: Secondary | ICD-10-CM | POA: Diagnosis not present

## 2018-04-18 DIAGNOSIS — D631 Anemia in chronic kidney disease: Secondary | ICD-10-CM | POA: Diagnosis not present

## 2018-04-18 DIAGNOSIS — N2581 Secondary hyperparathyroidism of renal origin: Secondary | ICD-10-CM | POA: Diagnosis not present

## 2018-04-19 ENCOUNTER — Encounter: Payer: Medicare Other | Admitting: Vascular Surgery

## 2018-04-19 DIAGNOSIS — N186 End stage renal disease: Secondary | ICD-10-CM | POA: Diagnosis not present

## 2018-04-19 DIAGNOSIS — E1142 Type 2 diabetes mellitus with diabetic polyneuropathy: Secondary | ICD-10-CM | POA: Diagnosis not present

## 2018-04-19 DIAGNOSIS — E1129 Type 2 diabetes mellitus with other diabetic kidney complication: Secondary | ICD-10-CM | POA: Diagnosis not present

## 2018-04-19 DIAGNOSIS — I5040 Unspecified combined systolic (congestive) and diastolic (congestive) heart failure: Secondary | ICD-10-CM | POA: Diagnosis not present

## 2018-04-19 DIAGNOSIS — Z992 Dependence on renal dialysis: Secondary | ICD-10-CM | POA: Diagnosis not present

## 2018-04-19 DIAGNOSIS — I10 Essential (primary) hypertension: Secondary | ICD-10-CM | POA: Diagnosis not present

## 2018-04-19 DIAGNOSIS — K219 Gastro-esophageal reflux disease without esophagitis: Secondary | ICD-10-CM | POA: Diagnosis not present

## 2018-04-20 DIAGNOSIS — E876 Hypokalemia: Secondary | ICD-10-CM | POA: Diagnosis not present

## 2018-04-20 DIAGNOSIS — N186 End stage renal disease: Secondary | ICD-10-CM | POA: Diagnosis not present

## 2018-04-20 DIAGNOSIS — N2581 Secondary hyperparathyroidism of renal origin: Secondary | ICD-10-CM | POA: Diagnosis not present

## 2018-04-20 DIAGNOSIS — D631 Anemia in chronic kidney disease: Secondary | ICD-10-CM | POA: Diagnosis not present

## 2018-04-20 DIAGNOSIS — E119 Type 2 diabetes mellitus without complications: Secondary | ICD-10-CM | POA: Diagnosis not present

## 2018-04-21 DIAGNOSIS — R262 Difficulty in walking, not elsewhere classified: Secondary | ICD-10-CM | POA: Diagnosis not present

## 2018-04-21 DIAGNOSIS — R41841 Cognitive communication deficit: Secondary | ICD-10-CM | POA: Diagnosis not present

## 2018-04-22 DIAGNOSIS — R41841 Cognitive communication deficit: Secondary | ICD-10-CM | POA: Diagnosis not present

## 2018-04-22 DIAGNOSIS — E876 Hypokalemia: Secondary | ICD-10-CM | POA: Diagnosis not present

## 2018-04-22 DIAGNOSIS — E119 Type 2 diabetes mellitus without complications: Secondary | ICD-10-CM | POA: Diagnosis not present

## 2018-04-22 DIAGNOSIS — N186 End stage renal disease: Secondary | ICD-10-CM | POA: Diagnosis not present

## 2018-04-22 DIAGNOSIS — N2581 Secondary hyperparathyroidism of renal origin: Secondary | ICD-10-CM | POA: Diagnosis not present

## 2018-04-22 DIAGNOSIS — R262 Difficulty in walking, not elsewhere classified: Secondary | ICD-10-CM | POA: Diagnosis not present

## 2018-04-22 DIAGNOSIS — D631 Anemia in chronic kidney disease: Secondary | ICD-10-CM | POA: Diagnosis not present

## 2018-04-24 DIAGNOSIS — R262 Difficulty in walking, not elsewhere classified: Secondary | ICD-10-CM | POA: Diagnosis not present

## 2018-04-24 DIAGNOSIS — R41841 Cognitive communication deficit: Secondary | ICD-10-CM | POA: Diagnosis not present

## 2018-04-25 DIAGNOSIS — D631 Anemia in chronic kidney disease: Secondary | ICD-10-CM | POA: Diagnosis not present

## 2018-04-25 DIAGNOSIS — R41841 Cognitive communication deficit: Secondary | ICD-10-CM | POA: Diagnosis not present

## 2018-04-25 DIAGNOSIS — E119 Type 2 diabetes mellitus without complications: Secondary | ICD-10-CM | POA: Diagnosis not present

## 2018-04-25 DIAGNOSIS — N2581 Secondary hyperparathyroidism of renal origin: Secondary | ICD-10-CM | POA: Diagnosis not present

## 2018-04-25 DIAGNOSIS — E876 Hypokalemia: Secondary | ICD-10-CM | POA: Diagnosis not present

## 2018-04-25 DIAGNOSIS — R262 Difficulty in walking, not elsewhere classified: Secondary | ICD-10-CM | POA: Diagnosis not present

## 2018-04-25 DIAGNOSIS — N186 End stage renal disease: Secondary | ICD-10-CM | POA: Diagnosis not present

## 2018-04-26 DIAGNOSIS — R41841 Cognitive communication deficit: Secondary | ICD-10-CM | POA: Diagnosis not present

## 2018-04-26 DIAGNOSIS — R262 Difficulty in walking, not elsewhere classified: Secondary | ICD-10-CM | POA: Diagnosis not present

## 2018-04-27 DIAGNOSIS — D631 Anemia in chronic kidney disease: Secondary | ICD-10-CM | POA: Diagnosis not present

## 2018-04-27 DIAGNOSIS — E876 Hypokalemia: Secondary | ICD-10-CM | POA: Diagnosis not present

## 2018-04-27 DIAGNOSIS — N186 End stage renal disease: Secondary | ICD-10-CM | POA: Diagnosis not present

## 2018-04-27 DIAGNOSIS — E119 Type 2 diabetes mellitus without complications: Secondary | ICD-10-CM | POA: Diagnosis not present

## 2018-04-27 DIAGNOSIS — N2581 Secondary hyperparathyroidism of renal origin: Secondary | ICD-10-CM | POA: Diagnosis not present

## 2018-04-27 DIAGNOSIS — R262 Difficulty in walking, not elsewhere classified: Secondary | ICD-10-CM | POA: Diagnosis not present

## 2018-04-27 DIAGNOSIS — R41841 Cognitive communication deficit: Secondary | ICD-10-CM | POA: Diagnosis not present

## 2018-04-29 DIAGNOSIS — E119 Type 2 diabetes mellitus without complications: Secondary | ICD-10-CM | POA: Diagnosis not present

## 2018-04-29 DIAGNOSIS — D631 Anemia in chronic kidney disease: Secondary | ICD-10-CM | POA: Diagnosis not present

## 2018-04-29 DIAGNOSIS — N186 End stage renal disease: Secondary | ICD-10-CM | POA: Diagnosis not present

## 2018-04-29 DIAGNOSIS — N2581 Secondary hyperparathyroidism of renal origin: Secondary | ICD-10-CM | POA: Diagnosis not present

## 2018-04-29 DIAGNOSIS — R41841 Cognitive communication deficit: Secondary | ICD-10-CM | POA: Diagnosis not present

## 2018-04-29 DIAGNOSIS — R262 Difficulty in walking, not elsewhere classified: Secondary | ICD-10-CM | POA: Diagnosis not present

## 2018-04-29 DIAGNOSIS — E876 Hypokalemia: Secondary | ICD-10-CM | POA: Diagnosis not present

## 2018-05-02 DIAGNOSIS — E119 Type 2 diabetes mellitus without complications: Secondary | ICD-10-CM | POA: Diagnosis not present

## 2018-05-02 DIAGNOSIS — E876 Hypokalemia: Secondary | ICD-10-CM | POA: Diagnosis not present

## 2018-05-02 DIAGNOSIS — D631 Anemia in chronic kidney disease: Secondary | ICD-10-CM | POA: Diagnosis not present

## 2018-05-02 DIAGNOSIS — R41841 Cognitive communication deficit: Secondary | ICD-10-CM | POA: Diagnosis not present

## 2018-05-02 DIAGNOSIS — N2581 Secondary hyperparathyroidism of renal origin: Secondary | ICD-10-CM | POA: Diagnosis not present

## 2018-05-02 DIAGNOSIS — N186 End stage renal disease: Secondary | ICD-10-CM | POA: Diagnosis not present

## 2018-05-02 DIAGNOSIS — R262 Difficulty in walking, not elsewhere classified: Secondary | ICD-10-CM | POA: Diagnosis not present

## 2018-05-03 DIAGNOSIS — R41841 Cognitive communication deficit: Secondary | ICD-10-CM | POA: Diagnosis not present

## 2018-05-03 DIAGNOSIS — R262 Difficulty in walking, not elsewhere classified: Secondary | ICD-10-CM | POA: Diagnosis not present

## 2018-05-04 DIAGNOSIS — N186 End stage renal disease: Secondary | ICD-10-CM | POA: Diagnosis not present

## 2018-05-04 DIAGNOSIS — F0390 Unspecified dementia without behavioral disturbance: Secondary | ICD-10-CM | POA: Diagnosis not present

## 2018-05-04 DIAGNOSIS — I5040 Unspecified combined systolic (congestive) and diastolic (congestive) heart failure: Secondary | ICD-10-CM | POA: Diagnosis not present

## 2018-05-04 DIAGNOSIS — R262 Difficulty in walking, not elsewhere classified: Secondary | ICD-10-CM | POA: Diagnosis not present

## 2018-05-04 DIAGNOSIS — E876 Hypokalemia: Secondary | ICD-10-CM | POA: Diagnosis not present

## 2018-05-04 DIAGNOSIS — E119 Type 2 diabetes mellitus without complications: Secondary | ICD-10-CM | POA: Diagnosis not present

## 2018-05-04 DIAGNOSIS — D631 Anemia in chronic kidney disease: Secondary | ICD-10-CM | POA: Diagnosis not present

## 2018-05-04 DIAGNOSIS — N2581 Secondary hyperparathyroidism of renal origin: Secondary | ICD-10-CM | POA: Diagnosis not present

## 2018-05-04 DIAGNOSIS — I120 Hypertensive chronic kidney disease with stage 5 chronic kidney disease or end stage renal disease: Secondary | ICD-10-CM | POA: Diagnosis not present

## 2018-05-04 DIAGNOSIS — R41841 Cognitive communication deficit: Secondary | ICD-10-CM | POA: Diagnosis not present

## 2018-05-05 DIAGNOSIS — R262 Difficulty in walking, not elsewhere classified: Secondary | ICD-10-CM | POA: Diagnosis not present

## 2018-05-05 DIAGNOSIS — R41841 Cognitive communication deficit: Secondary | ICD-10-CM | POA: Diagnosis not present

## 2018-05-06 DIAGNOSIS — D631 Anemia in chronic kidney disease: Secondary | ICD-10-CM | POA: Diagnosis not present

## 2018-05-06 DIAGNOSIS — R262 Difficulty in walking, not elsewhere classified: Secondary | ICD-10-CM | POA: Diagnosis not present

## 2018-05-06 DIAGNOSIS — E119 Type 2 diabetes mellitus without complications: Secondary | ICD-10-CM | POA: Diagnosis not present

## 2018-05-06 DIAGNOSIS — E876 Hypokalemia: Secondary | ICD-10-CM | POA: Diagnosis not present

## 2018-05-06 DIAGNOSIS — N186 End stage renal disease: Secondary | ICD-10-CM | POA: Diagnosis not present

## 2018-05-06 DIAGNOSIS — R41841 Cognitive communication deficit: Secondary | ICD-10-CM | POA: Diagnosis not present

## 2018-05-06 DIAGNOSIS — N2581 Secondary hyperparathyroidism of renal origin: Secondary | ICD-10-CM | POA: Diagnosis not present

## 2018-05-09 DIAGNOSIS — D649 Anemia, unspecified: Secondary | ICD-10-CM | POA: Diagnosis not present

## 2018-05-09 DIAGNOSIS — E119 Type 2 diabetes mellitus without complications: Secondary | ICD-10-CM | POA: Diagnosis not present

## 2018-05-09 DIAGNOSIS — R41841 Cognitive communication deficit: Secondary | ICD-10-CM | POA: Diagnosis not present

## 2018-05-09 DIAGNOSIS — R7989 Other specified abnormal findings of blood chemistry: Secondary | ICD-10-CM | POA: Diagnosis not present

## 2018-05-09 DIAGNOSIS — R6889 Other general symptoms and signs: Secondary | ICD-10-CM | POA: Diagnosis not present

## 2018-05-09 DIAGNOSIS — N186 End stage renal disease: Secondary | ICD-10-CM | POA: Diagnosis not present

## 2018-05-09 DIAGNOSIS — N2581 Secondary hyperparathyroidism of renal origin: Secondary | ICD-10-CM | POA: Diagnosis not present

## 2018-05-09 DIAGNOSIS — D631 Anemia in chronic kidney disease: Secondary | ICD-10-CM | POA: Diagnosis not present

## 2018-05-09 DIAGNOSIS — R262 Difficulty in walking, not elsewhere classified: Secondary | ICD-10-CM | POA: Diagnosis not present

## 2018-05-09 DIAGNOSIS — E876 Hypokalemia: Secondary | ICD-10-CM | POA: Diagnosis not present

## 2018-05-10 DIAGNOSIS — R41841 Cognitive communication deficit: Secondary | ICD-10-CM | POA: Diagnosis not present

## 2018-05-10 DIAGNOSIS — R262 Difficulty in walking, not elsewhere classified: Secondary | ICD-10-CM | POA: Diagnosis not present

## 2018-05-11 DIAGNOSIS — N186 End stage renal disease: Secondary | ICD-10-CM | POA: Diagnosis not present

## 2018-05-11 DIAGNOSIS — N2581 Secondary hyperparathyroidism of renal origin: Secondary | ICD-10-CM | POA: Diagnosis not present

## 2018-05-11 DIAGNOSIS — E876 Hypokalemia: Secondary | ICD-10-CM | POA: Diagnosis not present

## 2018-05-11 DIAGNOSIS — E119 Type 2 diabetes mellitus without complications: Secondary | ICD-10-CM | POA: Diagnosis not present

## 2018-05-11 DIAGNOSIS — D631 Anemia in chronic kidney disease: Secondary | ICD-10-CM | POA: Diagnosis not present

## 2018-05-13 DIAGNOSIS — R41841 Cognitive communication deficit: Secondary | ICD-10-CM | POA: Diagnosis not present

## 2018-05-13 DIAGNOSIS — N2581 Secondary hyperparathyroidism of renal origin: Secondary | ICD-10-CM | POA: Diagnosis not present

## 2018-05-13 DIAGNOSIS — E876 Hypokalemia: Secondary | ICD-10-CM | POA: Diagnosis not present

## 2018-05-13 DIAGNOSIS — R262 Difficulty in walking, not elsewhere classified: Secondary | ICD-10-CM | POA: Diagnosis not present

## 2018-05-13 DIAGNOSIS — E119 Type 2 diabetes mellitus without complications: Secondary | ICD-10-CM | POA: Diagnosis not present

## 2018-05-13 DIAGNOSIS — N186 End stage renal disease: Secondary | ICD-10-CM | POA: Diagnosis not present

## 2018-05-13 DIAGNOSIS — D631 Anemia in chronic kidney disease: Secondary | ICD-10-CM | POA: Diagnosis not present

## 2018-05-15 DIAGNOSIS — R41841 Cognitive communication deficit: Secondary | ICD-10-CM | POA: Diagnosis not present

## 2018-05-15 DIAGNOSIS — R262 Difficulty in walking, not elsewhere classified: Secondary | ICD-10-CM | POA: Diagnosis not present

## 2018-05-16 DIAGNOSIS — N186 End stage renal disease: Secondary | ICD-10-CM | POA: Diagnosis not present

## 2018-05-16 DIAGNOSIS — N2581 Secondary hyperparathyroidism of renal origin: Secondary | ICD-10-CM | POA: Diagnosis not present

## 2018-05-16 DIAGNOSIS — R41841 Cognitive communication deficit: Secondary | ICD-10-CM | POA: Diagnosis not present

## 2018-05-16 DIAGNOSIS — R262 Difficulty in walking, not elsewhere classified: Secondary | ICD-10-CM | POA: Diagnosis not present

## 2018-05-16 DIAGNOSIS — D631 Anemia in chronic kidney disease: Secondary | ICD-10-CM | POA: Diagnosis not present

## 2018-05-16 DIAGNOSIS — E876 Hypokalemia: Secondary | ICD-10-CM | POA: Diagnosis not present

## 2018-05-16 DIAGNOSIS — E119 Type 2 diabetes mellitus without complications: Secondary | ICD-10-CM | POA: Diagnosis not present

## 2018-05-17 DIAGNOSIS — Z79899 Other long term (current) drug therapy: Secondary | ICD-10-CM | POA: Diagnosis not present

## 2018-05-17 DIAGNOSIS — R262 Difficulty in walking, not elsewhere classified: Secondary | ICD-10-CM | POA: Diagnosis not present

## 2018-05-17 DIAGNOSIS — R41841 Cognitive communication deficit: Secondary | ICD-10-CM | POA: Diagnosis not present

## 2018-05-17 DIAGNOSIS — R05 Cough: Secondary | ICD-10-CM | POA: Diagnosis not present

## 2018-05-17 DIAGNOSIS — D649 Anemia, unspecified: Secondary | ICD-10-CM | POA: Diagnosis not present

## 2018-05-18 DIAGNOSIS — R0989 Other specified symptoms and signs involving the circulatory and respiratory systems: Secondary | ICD-10-CM | POA: Diagnosis not present

## 2018-05-18 DIAGNOSIS — N2581 Secondary hyperparathyroidism of renal origin: Secondary | ICD-10-CM | POA: Diagnosis not present

## 2018-05-18 DIAGNOSIS — E119 Type 2 diabetes mellitus without complications: Secondary | ICD-10-CM | POA: Diagnosis not present

## 2018-05-18 DIAGNOSIS — E876 Hypokalemia: Secondary | ICD-10-CM | POA: Diagnosis not present

## 2018-05-18 DIAGNOSIS — R262 Difficulty in walking, not elsewhere classified: Secondary | ICD-10-CM | POA: Diagnosis not present

## 2018-05-18 DIAGNOSIS — N186 End stage renal disease: Secondary | ICD-10-CM | POA: Diagnosis not present

## 2018-05-18 DIAGNOSIS — R41841 Cognitive communication deficit: Secondary | ICD-10-CM | POA: Diagnosis not present

## 2018-05-18 DIAGNOSIS — D631 Anemia in chronic kidney disease: Secondary | ICD-10-CM | POA: Diagnosis not present

## 2018-05-18 DIAGNOSIS — R05 Cough: Secondary | ICD-10-CM | POA: Diagnosis not present

## 2018-05-19 DIAGNOSIS — R41841 Cognitive communication deficit: Secondary | ICD-10-CM | POA: Diagnosis not present

## 2018-05-19 DIAGNOSIS — R262 Difficulty in walking, not elsewhere classified: Secondary | ICD-10-CM | POA: Diagnosis not present

## 2018-05-20 DIAGNOSIS — E1129 Type 2 diabetes mellitus with other diabetic kidney complication: Secondary | ICD-10-CM | POA: Diagnosis not present

## 2018-05-20 DIAGNOSIS — N2581 Secondary hyperparathyroidism of renal origin: Secondary | ICD-10-CM | POA: Diagnosis not present

## 2018-05-20 DIAGNOSIS — D631 Anemia in chronic kidney disease: Secondary | ICD-10-CM | POA: Diagnosis not present

## 2018-05-20 DIAGNOSIS — I5040 Unspecified combined systolic (congestive) and diastolic (congestive) heart failure: Secondary | ICD-10-CM | POA: Diagnosis not present

## 2018-05-20 DIAGNOSIS — Z992 Dependence on renal dialysis: Secondary | ICD-10-CM | POA: Diagnosis not present

## 2018-05-20 DIAGNOSIS — E119 Type 2 diabetes mellitus without complications: Secondary | ICD-10-CM | POA: Diagnosis not present

## 2018-05-20 DIAGNOSIS — N186 End stage renal disease: Secondary | ICD-10-CM | POA: Diagnosis not present

## 2018-05-20 DIAGNOSIS — E1142 Type 2 diabetes mellitus with diabetic polyneuropathy: Secondary | ICD-10-CM | POA: Diagnosis not present

## 2018-05-20 DIAGNOSIS — K219 Gastro-esophageal reflux disease without esophagitis: Secondary | ICD-10-CM | POA: Diagnosis not present

## 2018-05-20 DIAGNOSIS — I10 Essential (primary) hypertension: Secondary | ICD-10-CM | POA: Diagnosis not present

## 2018-05-23 DIAGNOSIS — N2581 Secondary hyperparathyroidism of renal origin: Secondary | ICD-10-CM | POA: Diagnosis not present

## 2018-05-23 DIAGNOSIS — N186 End stage renal disease: Secondary | ICD-10-CM | POA: Diagnosis not present

## 2018-05-23 DIAGNOSIS — D631 Anemia in chronic kidney disease: Secondary | ICD-10-CM | POA: Diagnosis not present

## 2018-05-23 DIAGNOSIS — I5040 Unspecified combined systolic (congestive) and diastolic (congestive) heart failure: Secondary | ICD-10-CM | POA: Diagnosis not present

## 2018-05-23 DIAGNOSIS — K219 Gastro-esophageal reflux disease without esophagitis: Secondary | ICD-10-CM | POA: Diagnosis not present

## 2018-05-23 DIAGNOSIS — I10 Essential (primary) hypertension: Secondary | ICD-10-CM | POA: Diagnosis not present

## 2018-05-23 DIAGNOSIS — E119 Type 2 diabetes mellitus without complications: Secondary | ICD-10-CM | POA: Diagnosis not present

## 2018-05-23 DIAGNOSIS — E1142 Type 2 diabetes mellitus with diabetic polyneuropathy: Secondary | ICD-10-CM | POA: Diagnosis not present

## 2018-05-25 DIAGNOSIS — E119 Type 2 diabetes mellitus without complications: Secondary | ICD-10-CM | POA: Diagnosis not present

## 2018-05-25 DIAGNOSIS — D631 Anemia in chronic kidney disease: Secondary | ICD-10-CM | POA: Diagnosis not present

## 2018-05-25 DIAGNOSIS — N2581 Secondary hyperparathyroidism of renal origin: Secondary | ICD-10-CM | POA: Diagnosis not present

## 2018-05-25 DIAGNOSIS — N186 End stage renal disease: Secondary | ICD-10-CM | POA: Diagnosis not present

## 2018-05-27 DIAGNOSIS — N186 End stage renal disease: Secondary | ICD-10-CM | POA: Diagnosis not present

## 2018-05-27 DIAGNOSIS — N2581 Secondary hyperparathyroidism of renal origin: Secondary | ICD-10-CM | POA: Diagnosis not present

## 2018-05-27 DIAGNOSIS — E119 Type 2 diabetes mellitus without complications: Secondary | ICD-10-CM | POA: Diagnosis not present

## 2018-05-27 DIAGNOSIS — D631 Anemia in chronic kidney disease: Secondary | ICD-10-CM | POA: Diagnosis not present

## 2018-05-30 DIAGNOSIS — N186 End stage renal disease: Secondary | ICD-10-CM | POA: Diagnosis not present

## 2018-05-30 DIAGNOSIS — D631 Anemia in chronic kidney disease: Secondary | ICD-10-CM | POA: Diagnosis not present

## 2018-05-30 DIAGNOSIS — N2581 Secondary hyperparathyroidism of renal origin: Secondary | ICD-10-CM | POA: Diagnosis not present

## 2018-05-30 DIAGNOSIS — E119 Type 2 diabetes mellitus without complications: Secondary | ICD-10-CM | POA: Diagnosis not present

## 2018-06-01 DIAGNOSIS — D631 Anemia in chronic kidney disease: Secondary | ICD-10-CM | POA: Diagnosis not present

## 2018-06-01 DIAGNOSIS — N186 End stage renal disease: Secondary | ICD-10-CM | POA: Diagnosis not present

## 2018-06-01 DIAGNOSIS — E119 Type 2 diabetes mellitus without complications: Secondary | ICD-10-CM | POA: Diagnosis not present

## 2018-06-01 DIAGNOSIS — N2581 Secondary hyperparathyroidism of renal origin: Secondary | ICD-10-CM | POA: Diagnosis not present

## 2018-06-03 DIAGNOSIS — N2581 Secondary hyperparathyroidism of renal origin: Secondary | ICD-10-CM | POA: Diagnosis not present

## 2018-06-03 DIAGNOSIS — E119 Type 2 diabetes mellitus without complications: Secondary | ICD-10-CM | POA: Diagnosis not present

## 2018-06-03 DIAGNOSIS — D631 Anemia in chronic kidney disease: Secondary | ICD-10-CM | POA: Diagnosis not present

## 2018-06-03 DIAGNOSIS — N186 End stage renal disease: Secondary | ICD-10-CM | POA: Diagnosis not present

## 2018-06-06 DIAGNOSIS — E119 Type 2 diabetes mellitus without complications: Secondary | ICD-10-CM | POA: Diagnosis not present

## 2018-06-06 DIAGNOSIS — I5040 Unspecified combined systolic (congestive) and diastolic (congestive) heart failure: Secondary | ICD-10-CM | POA: Diagnosis not present

## 2018-06-06 DIAGNOSIS — D631 Anemia in chronic kidney disease: Secondary | ICD-10-CM | POA: Diagnosis not present

## 2018-06-06 DIAGNOSIS — N186 End stage renal disease: Secondary | ICD-10-CM | POA: Diagnosis not present

## 2018-06-06 DIAGNOSIS — I739 Peripheral vascular disease, unspecified: Secondary | ICD-10-CM | POA: Diagnosis not present

## 2018-06-06 DIAGNOSIS — N2581 Secondary hyperparathyroidism of renal origin: Secondary | ICD-10-CM | POA: Diagnosis not present

## 2018-06-06 DIAGNOSIS — I120 Hypertensive chronic kidney disease with stage 5 chronic kidney disease or end stage renal disease: Secondary | ICD-10-CM | POA: Diagnosis not present

## 2018-06-06 DIAGNOSIS — F0151 Vascular dementia with behavioral disturbance: Secondary | ICD-10-CM | POA: Diagnosis not present

## 2018-06-08 DIAGNOSIS — D631 Anemia in chronic kidney disease: Secondary | ICD-10-CM | POA: Diagnosis not present

## 2018-06-08 DIAGNOSIS — N186 End stage renal disease: Secondary | ICD-10-CM | POA: Diagnosis not present

## 2018-06-08 DIAGNOSIS — E119 Type 2 diabetes mellitus without complications: Secondary | ICD-10-CM | POA: Diagnosis not present

## 2018-06-08 DIAGNOSIS — N2581 Secondary hyperparathyroidism of renal origin: Secondary | ICD-10-CM | POA: Diagnosis not present

## 2018-06-10 DIAGNOSIS — D631 Anemia in chronic kidney disease: Secondary | ICD-10-CM | POA: Diagnosis not present

## 2018-06-10 DIAGNOSIS — N2581 Secondary hyperparathyroidism of renal origin: Secondary | ICD-10-CM | POA: Diagnosis not present

## 2018-06-10 DIAGNOSIS — N186 End stage renal disease: Secondary | ICD-10-CM | POA: Diagnosis not present

## 2018-06-10 DIAGNOSIS — E119 Type 2 diabetes mellitus without complications: Secondary | ICD-10-CM | POA: Diagnosis not present

## 2018-06-13 DIAGNOSIS — N2581 Secondary hyperparathyroidism of renal origin: Secondary | ICD-10-CM | POA: Diagnosis not present

## 2018-06-13 DIAGNOSIS — E119 Type 2 diabetes mellitus without complications: Secondary | ICD-10-CM | POA: Diagnosis not present

## 2018-06-13 DIAGNOSIS — N186 End stage renal disease: Secondary | ICD-10-CM | POA: Diagnosis not present

## 2018-06-13 DIAGNOSIS — D631 Anemia in chronic kidney disease: Secondary | ICD-10-CM | POA: Diagnosis not present

## 2018-06-15 DIAGNOSIS — E119 Type 2 diabetes mellitus without complications: Secondary | ICD-10-CM | POA: Diagnosis not present

## 2018-06-15 DIAGNOSIS — N186 End stage renal disease: Secondary | ICD-10-CM | POA: Diagnosis not present

## 2018-06-15 DIAGNOSIS — N2581 Secondary hyperparathyroidism of renal origin: Secondary | ICD-10-CM | POA: Diagnosis not present

## 2018-06-15 DIAGNOSIS — D631 Anemia in chronic kidney disease: Secondary | ICD-10-CM | POA: Diagnosis not present

## 2018-06-17 DIAGNOSIS — E119 Type 2 diabetes mellitus without complications: Secondary | ICD-10-CM | POA: Diagnosis not present

## 2018-06-17 DIAGNOSIS — D631 Anemia in chronic kidney disease: Secondary | ICD-10-CM | POA: Diagnosis not present

## 2018-06-17 DIAGNOSIS — N186 End stage renal disease: Secondary | ICD-10-CM | POA: Diagnosis not present

## 2018-06-17 DIAGNOSIS — N2581 Secondary hyperparathyroidism of renal origin: Secondary | ICD-10-CM | POA: Diagnosis not present

## 2018-06-19 DIAGNOSIS — E1129 Type 2 diabetes mellitus with other diabetic kidney complication: Secondary | ICD-10-CM | POA: Diagnosis not present

## 2018-06-19 DIAGNOSIS — Z992 Dependence on renal dialysis: Secondary | ICD-10-CM | POA: Diagnosis not present

## 2018-06-19 DIAGNOSIS — N186 End stage renal disease: Secondary | ICD-10-CM | POA: Diagnosis not present

## 2018-06-20 DIAGNOSIS — D631 Anemia in chronic kidney disease: Secondary | ICD-10-CM | POA: Diagnosis not present

## 2018-06-20 DIAGNOSIS — N186 End stage renal disease: Secondary | ICD-10-CM | POA: Diagnosis not present

## 2018-06-20 DIAGNOSIS — E119 Type 2 diabetes mellitus without complications: Secondary | ICD-10-CM | POA: Diagnosis not present

## 2018-06-20 DIAGNOSIS — N2581 Secondary hyperparathyroidism of renal origin: Secondary | ICD-10-CM | POA: Diagnosis not present

## 2018-06-21 DIAGNOSIS — B351 Tinea unguium: Secondary | ICD-10-CM | POA: Diagnosis not present

## 2018-06-21 DIAGNOSIS — I739 Peripheral vascular disease, unspecified: Secondary | ICD-10-CM | POA: Diagnosis not present

## 2018-06-21 DIAGNOSIS — L603 Nail dystrophy: Secondary | ICD-10-CM | POA: Diagnosis not present

## 2018-06-22 DIAGNOSIS — E119 Type 2 diabetes mellitus without complications: Secondary | ICD-10-CM | POA: Diagnosis not present

## 2018-06-22 DIAGNOSIS — N2581 Secondary hyperparathyroidism of renal origin: Secondary | ICD-10-CM | POA: Diagnosis not present

## 2018-06-22 DIAGNOSIS — N186 End stage renal disease: Secondary | ICD-10-CM | POA: Diagnosis not present

## 2018-06-22 DIAGNOSIS — D631 Anemia in chronic kidney disease: Secondary | ICD-10-CM | POA: Diagnosis not present

## 2018-06-24 DIAGNOSIS — N2581 Secondary hyperparathyroidism of renal origin: Secondary | ICD-10-CM | POA: Diagnosis not present

## 2018-06-24 DIAGNOSIS — N186 End stage renal disease: Secondary | ICD-10-CM | POA: Diagnosis not present

## 2018-06-24 DIAGNOSIS — D631 Anemia in chronic kidney disease: Secondary | ICD-10-CM | POA: Diagnosis not present

## 2018-06-24 DIAGNOSIS — E119 Type 2 diabetes mellitus without complications: Secondary | ICD-10-CM | POA: Diagnosis not present

## 2018-06-27 DIAGNOSIS — N2581 Secondary hyperparathyroidism of renal origin: Secondary | ICD-10-CM | POA: Diagnosis not present

## 2018-06-27 DIAGNOSIS — N186 End stage renal disease: Secondary | ICD-10-CM | POA: Diagnosis not present

## 2018-06-27 DIAGNOSIS — E119 Type 2 diabetes mellitus without complications: Secondary | ICD-10-CM | POA: Diagnosis not present

## 2018-06-27 DIAGNOSIS — D631 Anemia in chronic kidney disease: Secondary | ICD-10-CM | POA: Diagnosis not present

## 2018-06-29 DIAGNOSIS — E119 Type 2 diabetes mellitus without complications: Secondary | ICD-10-CM | POA: Diagnosis not present

## 2018-06-29 DIAGNOSIS — D631 Anemia in chronic kidney disease: Secondary | ICD-10-CM | POA: Diagnosis not present

## 2018-06-29 DIAGNOSIS — N186 End stage renal disease: Secondary | ICD-10-CM | POA: Diagnosis not present

## 2018-06-29 DIAGNOSIS — N2581 Secondary hyperparathyroidism of renal origin: Secondary | ICD-10-CM | POA: Diagnosis not present

## 2018-07-01 DIAGNOSIS — I10 Essential (primary) hypertension: Secondary | ICD-10-CM | POA: Diagnosis not present

## 2018-07-01 DIAGNOSIS — F0390 Unspecified dementia without behavioral disturbance: Secondary | ICD-10-CM | POA: Diagnosis not present

## 2018-07-01 DIAGNOSIS — N186 End stage renal disease: Secondary | ICD-10-CM | POA: Diagnosis not present

## 2018-07-01 DIAGNOSIS — N2581 Secondary hyperparathyroidism of renal origin: Secondary | ICD-10-CM | POA: Diagnosis not present

## 2018-07-01 DIAGNOSIS — E119 Type 2 diabetes mellitus without complications: Secondary | ICD-10-CM | POA: Diagnosis not present

## 2018-07-01 DIAGNOSIS — D631 Anemia in chronic kidney disease: Secondary | ICD-10-CM | POA: Diagnosis not present

## 2018-07-04 DIAGNOSIS — N2581 Secondary hyperparathyroidism of renal origin: Secondary | ICD-10-CM | POA: Diagnosis not present

## 2018-07-04 DIAGNOSIS — D631 Anemia in chronic kidney disease: Secondary | ICD-10-CM | POA: Diagnosis not present

## 2018-07-04 DIAGNOSIS — E119 Type 2 diabetes mellitus without complications: Secondary | ICD-10-CM | POA: Diagnosis not present

## 2018-07-04 DIAGNOSIS — N186 End stage renal disease: Secondary | ICD-10-CM | POA: Diagnosis not present

## 2018-07-06 DIAGNOSIS — E119 Type 2 diabetes mellitus without complications: Secondary | ICD-10-CM | POA: Diagnosis not present

## 2018-07-06 DIAGNOSIS — N2581 Secondary hyperparathyroidism of renal origin: Secondary | ICD-10-CM | POA: Diagnosis not present

## 2018-07-06 DIAGNOSIS — N186 End stage renal disease: Secondary | ICD-10-CM | POA: Diagnosis not present

## 2018-07-06 DIAGNOSIS — D631 Anemia in chronic kidney disease: Secondary | ICD-10-CM | POA: Diagnosis not present

## 2018-07-08 DIAGNOSIS — E119 Type 2 diabetes mellitus without complications: Secondary | ICD-10-CM | POA: Diagnosis not present

## 2018-07-08 DIAGNOSIS — N2581 Secondary hyperparathyroidism of renal origin: Secondary | ICD-10-CM | POA: Diagnosis not present

## 2018-07-08 DIAGNOSIS — Z961 Presence of intraocular lens: Secondary | ICD-10-CM | POA: Diagnosis not present

## 2018-07-08 DIAGNOSIS — H04123 Dry eye syndrome of bilateral lacrimal glands: Secondary | ICD-10-CM | POA: Diagnosis not present

## 2018-07-08 DIAGNOSIS — F0390 Unspecified dementia without behavioral disturbance: Secondary | ICD-10-CM | POA: Diagnosis not present

## 2018-07-08 DIAGNOSIS — D631 Anemia in chronic kidney disease: Secondary | ICD-10-CM | POA: Diagnosis not present

## 2018-07-08 DIAGNOSIS — H539 Unspecified visual disturbance: Secondary | ICD-10-CM | POA: Diagnosis not present

## 2018-07-08 DIAGNOSIS — N186 End stage renal disease: Secondary | ICD-10-CM | POA: Diagnosis not present

## 2018-07-10 DIAGNOSIS — E119 Type 2 diabetes mellitus without complications: Secondary | ICD-10-CM | POA: Diagnosis not present

## 2018-07-10 DIAGNOSIS — D631 Anemia in chronic kidney disease: Secondary | ICD-10-CM | POA: Diagnosis not present

## 2018-07-10 DIAGNOSIS — N2581 Secondary hyperparathyroidism of renal origin: Secondary | ICD-10-CM | POA: Diagnosis not present

## 2018-07-10 DIAGNOSIS — N186 End stage renal disease: Secondary | ICD-10-CM | POA: Diagnosis not present

## 2018-07-12 DIAGNOSIS — N186 End stage renal disease: Secondary | ICD-10-CM | POA: Diagnosis not present

## 2018-07-12 DIAGNOSIS — E119 Type 2 diabetes mellitus without complications: Secondary | ICD-10-CM | POA: Diagnosis not present

## 2018-07-12 DIAGNOSIS — N2581 Secondary hyperparathyroidism of renal origin: Secondary | ICD-10-CM | POA: Diagnosis not present

## 2018-07-12 DIAGNOSIS — D631 Anemia in chronic kidney disease: Secondary | ICD-10-CM | POA: Diagnosis not present

## 2018-07-15 DIAGNOSIS — D631 Anemia in chronic kidney disease: Secondary | ICD-10-CM | POA: Diagnosis not present

## 2018-07-15 DIAGNOSIS — N2581 Secondary hyperparathyroidism of renal origin: Secondary | ICD-10-CM | POA: Diagnosis not present

## 2018-07-15 DIAGNOSIS — N186 End stage renal disease: Secondary | ICD-10-CM | POA: Diagnosis not present

## 2018-07-15 DIAGNOSIS — E119 Type 2 diabetes mellitus without complications: Secondary | ICD-10-CM | POA: Diagnosis not present

## 2018-07-17 DIAGNOSIS — I5040 Unspecified combined systolic (congestive) and diastolic (congestive) heart failure: Secondary | ICD-10-CM | POA: Diagnosis not present

## 2018-07-17 DIAGNOSIS — N186 End stage renal disease: Secondary | ICD-10-CM | POA: Diagnosis not present

## 2018-07-17 DIAGNOSIS — N2581 Secondary hyperparathyroidism of renal origin: Secondary | ICD-10-CM | POA: Diagnosis not present

## 2018-07-17 DIAGNOSIS — Z0000 Encounter for general adult medical examination without abnormal findings: Secondary | ICD-10-CM | POA: Diagnosis not present

## 2018-07-17 DIAGNOSIS — I10 Essential (primary) hypertension: Secondary | ICD-10-CM | POA: Diagnosis not present

## 2018-07-17 DIAGNOSIS — E119 Type 2 diabetes mellitus without complications: Secondary | ICD-10-CM | POA: Diagnosis not present

## 2018-07-17 DIAGNOSIS — D631 Anemia in chronic kidney disease: Secondary | ICD-10-CM | POA: Diagnosis not present

## 2018-07-17 DIAGNOSIS — E1142 Type 2 diabetes mellitus with diabetic polyneuropathy: Secondary | ICD-10-CM | POA: Diagnosis not present

## 2018-07-17 DIAGNOSIS — K219 Gastro-esophageal reflux disease without esophagitis: Secondary | ICD-10-CM | POA: Diagnosis not present

## 2018-07-19 DIAGNOSIS — N186 End stage renal disease: Secondary | ICD-10-CM | POA: Diagnosis not present

## 2018-07-19 DIAGNOSIS — E119 Type 2 diabetes mellitus without complications: Secondary | ICD-10-CM | POA: Diagnosis not present

## 2018-07-19 DIAGNOSIS — N2581 Secondary hyperparathyroidism of renal origin: Secondary | ICD-10-CM | POA: Diagnosis not present

## 2018-07-19 DIAGNOSIS — D631 Anemia in chronic kidney disease: Secondary | ICD-10-CM | POA: Diagnosis not present

## 2018-07-20 DIAGNOSIS — E1129 Type 2 diabetes mellitus with other diabetic kidney complication: Secondary | ICD-10-CM | POA: Diagnosis not present

## 2018-07-20 DIAGNOSIS — N186 End stage renal disease: Secondary | ICD-10-CM | POA: Diagnosis not present

## 2018-07-20 DIAGNOSIS — Z992 Dependence on renal dialysis: Secondary | ICD-10-CM | POA: Diagnosis not present

## 2018-07-22 DIAGNOSIS — E119 Type 2 diabetes mellitus without complications: Secondary | ICD-10-CM | POA: Diagnosis not present

## 2018-07-22 DIAGNOSIS — D509 Iron deficiency anemia, unspecified: Secondary | ICD-10-CM | POA: Diagnosis not present

## 2018-07-22 DIAGNOSIS — N2581 Secondary hyperparathyroidism of renal origin: Secondary | ICD-10-CM | POA: Diagnosis not present

## 2018-07-22 DIAGNOSIS — D631 Anemia in chronic kidney disease: Secondary | ICD-10-CM | POA: Diagnosis not present

## 2018-07-22 DIAGNOSIS — N186 End stage renal disease: Secondary | ICD-10-CM | POA: Diagnosis not present

## 2018-07-25 DIAGNOSIS — N2581 Secondary hyperparathyroidism of renal origin: Secondary | ICD-10-CM | POA: Diagnosis not present

## 2018-07-25 DIAGNOSIS — N186 End stage renal disease: Secondary | ICD-10-CM | POA: Diagnosis not present

## 2018-07-25 DIAGNOSIS — D509 Iron deficiency anemia, unspecified: Secondary | ICD-10-CM | POA: Diagnosis not present

## 2018-07-25 DIAGNOSIS — D631 Anemia in chronic kidney disease: Secondary | ICD-10-CM | POA: Diagnosis not present

## 2018-07-25 DIAGNOSIS — E119 Type 2 diabetes mellitus without complications: Secondary | ICD-10-CM | POA: Diagnosis not present

## 2018-07-27 DIAGNOSIS — N186 End stage renal disease: Secondary | ICD-10-CM | POA: Diagnosis not present

## 2018-07-27 DIAGNOSIS — N2581 Secondary hyperparathyroidism of renal origin: Secondary | ICD-10-CM | POA: Diagnosis not present

## 2018-07-27 DIAGNOSIS — D631 Anemia in chronic kidney disease: Secondary | ICD-10-CM | POA: Diagnosis not present

## 2018-07-27 DIAGNOSIS — D509 Iron deficiency anemia, unspecified: Secondary | ICD-10-CM | POA: Diagnosis not present

## 2018-07-27 DIAGNOSIS — E119 Type 2 diabetes mellitus without complications: Secondary | ICD-10-CM | POA: Diagnosis not present

## 2018-07-29 DIAGNOSIS — N186 End stage renal disease: Secondary | ICD-10-CM | POA: Diagnosis not present

## 2018-07-29 DIAGNOSIS — E119 Type 2 diabetes mellitus without complications: Secondary | ICD-10-CM | POA: Diagnosis not present

## 2018-07-29 DIAGNOSIS — D631 Anemia in chronic kidney disease: Secondary | ICD-10-CM | POA: Diagnosis not present

## 2018-07-29 DIAGNOSIS — N2581 Secondary hyperparathyroidism of renal origin: Secondary | ICD-10-CM | POA: Diagnosis not present

## 2018-07-29 DIAGNOSIS — D509 Iron deficiency anemia, unspecified: Secondary | ICD-10-CM | POA: Diagnosis not present

## 2018-08-01 DIAGNOSIS — E1142 Type 2 diabetes mellitus with diabetic polyneuropathy: Secondary | ICD-10-CM | POA: Diagnosis not present

## 2018-08-01 DIAGNOSIS — K219 Gastro-esophageal reflux disease without esophagitis: Secondary | ICD-10-CM | POA: Diagnosis not present

## 2018-08-01 DIAGNOSIS — I5040 Unspecified combined systolic (congestive) and diastolic (congestive) heart failure: Secondary | ICD-10-CM | POA: Diagnosis not present

## 2018-08-01 DIAGNOSIS — E119 Type 2 diabetes mellitus without complications: Secondary | ICD-10-CM | POA: Diagnosis not present

## 2018-08-01 DIAGNOSIS — D631 Anemia in chronic kidney disease: Secondary | ICD-10-CM | POA: Diagnosis not present

## 2018-08-01 DIAGNOSIS — D509 Iron deficiency anemia, unspecified: Secondary | ICD-10-CM | POA: Diagnosis not present

## 2018-08-01 DIAGNOSIS — I10 Essential (primary) hypertension: Secondary | ICD-10-CM | POA: Diagnosis not present

## 2018-08-01 DIAGNOSIS — N2581 Secondary hyperparathyroidism of renal origin: Secondary | ICD-10-CM | POA: Diagnosis not present

## 2018-08-01 DIAGNOSIS — N186 End stage renal disease: Secondary | ICD-10-CM | POA: Diagnosis not present

## 2018-08-03 DIAGNOSIS — N186 End stage renal disease: Secondary | ICD-10-CM | POA: Diagnosis not present

## 2018-08-03 DIAGNOSIS — N2581 Secondary hyperparathyroidism of renal origin: Secondary | ICD-10-CM | POA: Diagnosis not present

## 2018-08-03 DIAGNOSIS — D509 Iron deficiency anemia, unspecified: Secondary | ICD-10-CM | POA: Diagnosis not present

## 2018-08-03 DIAGNOSIS — E119 Type 2 diabetes mellitus without complications: Secondary | ICD-10-CM | POA: Diagnosis not present

## 2018-08-03 DIAGNOSIS — D631 Anemia in chronic kidney disease: Secondary | ICD-10-CM | POA: Diagnosis not present

## 2018-08-05 DIAGNOSIS — E119 Type 2 diabetes mellitus without complications: Secondary | ICD-10-CM | POA: Diagnosis not present

## 2018-08-05 DIAGNOSIS — E559 Vitamin D deficiency, unspecified: Secondary | ICD-10-CM | POA: Diagnosis not present

## 2018-08-05 DIAGNOSIS — K219 Gastro-esophageal reflux disease without esophagitis: Secondary | ICD-10-CM | POA: Diagnosis not present

## 2018-08-05 DIAGNOSIS — N2581 Secondary hyperparathyroidism of renal origin: Secondary | ICD-10-CM | POA: Diagnosis not present

## 2018-08-05 DIAGNOSIS — D631 Anemia in chronic kidney disease: Secondary | ICD-10-CM | POA: Diagnosis not present

## 2018-08-05 DIAGNOSIS — D509 Iron deficiency anemia, unspecified: Secondary | ICD-10-CM | POA: Diagnosis not present

## 2018-08-05 DIAGNOSIS — N186 End stage renal disease: Secondary | ICD-10-CM | POA: Diagnosis not present

## 2018-08-08 DIAGNOSIS — N186 End stage renal disease: Secondary | ICD-10-CM | POA: Diagnosis not present

## 2018-08-08 DIAGNOSIS — E119 Type 2 diabetes mellitus without complications: Secondary | ICD-10-CM | POA: Diagnosis not present

## 2018-08-08 DIAGNOSIS — D509 Iron deficiency anemia, unspecified: Secondary | ICD-10-CM | POA: Diagnosis not present

## 2018-08-08 DIAGNOSIS — N2581 Secondary hyperparathyroidism of renal origin: Secondary | ICD-10-CM | POA: Diagnosis not present

## 2018-08-08 DIAGNOSIS — D631 Anemia in chronic kidney disease: Secondary | ICD-10-CM | POA: Diagnosis not present

## 2018-08-10 DIAGNOSIS — D631 Anemia in chronic kidney disease: Secondary | ICD-10-CM | POA: Diagnosis not present

## 2018-08-10 DIAGNOSIS — E119 Type 2 diabetes mellitus without complications: Secondary | ICD-10-CM | POA: Diagnosis not present

## 2018-08-10 DIAGNOSIS — N186 End stage renal disease: Secondary | ICD-10-CM | POA: Diagnosis not present

## 2018-08-10 DIAGNOSIS — D509 Iron deficiency anemia, unspecified: Secondary | ICD-10-CM | POA: Diagnosis not present

## 2018-08-10 DIAGNOSIS — N2581 Secondary hyperparathyroidism of renal origin: Secondary | ICD-10-CM | POA: Diagnosis not present

## 2018-08-11 DIAGNOSIS — M6281 Muscle weakness (generalized): Secondary | ICD-10-CM | POA: Diagnosis not present

## 2018-08-11 DIAGNOSIS — R262 Difficulty in walking, not elsewhere classified: Secondary | ICD-10-CM | POA: Diagnosis not present

## 2018-08-12 DIAGNOSIS — D509 Iron deficiency anemia, unspecified: Secondary | ICD-10-CM | POA: Diagnosis not present

## 2018-08-12 DIAGNOSIS — N2581 Secondary hyperparathyroidism of renal origin: Secondary | ICD-10-CM | POA: Diagnosis not present

## 2018-08-12 DIAGNOSIS — D631 Anemia in chronic kidney disease: Secondary | ICD-10-CM | POA: Diagnosis not present

## 2018-08-12 DIAGNOSIS — E119 Type 2 diabetes mellitus without complications: Secondary | ICD-10-CM | POA: Diagnosis not present

## 2018-08-12 DIAGNOSIS — R262 Difficulty in walking, not elsewhere classified: Secondary | ICD-10-CM | POA: Diagnosis not present

## 2018-08-12 DIAGNOSIS — N186 End stage renal disease: Secondary | ICD-10-CM | POA: Diagnosis not present

## 2018-08-12 DIAGNOSIS — M6281 Muscle weakness (generalized): Secondary | ICD-10-CM | POA: Diagnosis not present

## 2018-08-14 DIAGNOSIS — R262 Difficulty in walking, not elsewhere classified: Secondary | ICD-10-CM | POA: Diagnosis not present

## 2018-08-14 DIAGNOSIS — M6281 Muscle weakness (generalized): Secondary | ICD-10-CM | POA: Diagnosis not present

## 2018-08-15 DIAGNOSIS — E119 Type 2 diabetes mellitus without complications: Secondary | ICD-10-CM | POA: Diagnosis not present

## 2018-08-15 DIAGNOSIS — D631 Anemia in chronic kidney disease: Secondary | ICD-10-CM | POA: Diagnosis not present

## 2018-08-15 DIAGNOSIS — N2581 Secondary hyperparathyroidism of renal origin: Secondary | ICD-10-CM | POA: Diagnosis not present

## 2018-08-15 DIAGNOSIS — D509 Iron deficiency anemia, unspecified: Secondary | ICD-10-CM | POA: Diagnosis not present

## 2018-08-15 DIAGNOSIS — N186 End stage renal disease: Secondary | ICD-10-CM | POA: Diagnosis not present

## 2018-08-16 DIAGNOSIS — M6281 Muscle weakness (generalized): Secondary | ICD-10-CM | POA: Diagnosis not present

## 2018-08-16 DIAGNOSIS — R262 Difficulty in walking, not elsewhere classified: Secondary | ICD-10-CM | POA: Diagnosis not present

## 2018-08-17 DIAGNOSIS — R262 Difficulty in walking, not elsewhere classified: Secondary | ICD-10-CM | POA: Diagnosis not present

## 2018-08-17 DIAGNOSIS — M6281 Muscle weakness (generalized): Secondary | ICD-10-CM | POA: Diagnosis not present

## 2018-08-17 DIAGNOSIS — D509 Iron deficiency anemia, unspecified: Secondary | ICD-10-CM | POA: Diagnosis not present

## 2018-08-17 DIAGNOSIS — N186 End stage renal disease: Secondary | ICD-10-CM | POA: Diagnosis not present

## 2018-08-17 DIAGNOSIS — D631 Anemia in chronic kidney disease: Secondary | ICD-10-CM | POA: Diagnosis not present

## 2018-08-17 DIAGNOSIS — E119 Type 2 diabetes mellitus without complications: Secondary | ICD-10-CM | POA: Diagnosis not present

## 2018-08-17 DIAGNOSIS — N2581 Secondary hyperparathyroidism of renal origin: Secondary | ICD-10-CM | POA: Diagnosis not present

## 2018-08-18 DIAGNOSIS — M6281 Muscle weakness (generalized): Secondary | ICD-10-CM | POA: Diagnosis not present

## 2018-08-18 DIAGNOSIS — R262 Difficulty in walking, not elsewhere classified: Secondary | ICD-10-CM | POA: Diagnosis not present

## 2018-08-19 DIAGNOSIS — D631 Anemia in chronic kidney disease: Secondary | ICD-10-CM | POA: Diagnosis not present

## 2018-08-19 DIAGNOSIS — N2581 Secondary hyperparathyroidism of renal origin: Secondary | ICD-10-CM | POA: Diagnosis not present

## 2018-08-19 DIAGNOSIS — E119 Type 2 diabetes mellitus without complications: Secondary | ICD-10-CM | POA: Diagnosis not present

## 2018-08-19 DIAGNOSIS — N186 End stage renal disease: Secondary | ICD-10-CM | POA: Diagnosis not present

## 2018-08-19 DIAGNOSIS — R262 Difficulty in walking, not elsewhere classified: Secondary | ICD-10-CM | POA: Diagnosis not present

## 2018-08-19 DIAGNOSIS — D509 Iron deficiency anemia, unspecified: Secondary | ICD-10-CM | POA: Diagnosis not present

## 2018-08-19 DIAGNOSIS — M6281 Muscle weakness (generalized): Secondary | ICD-10-CM | POA: Diagnosis not present

## 2018-08-20 DIAGNOSIS — E1129 Type 2 diabetes mellitus with other diabetic kidney complication: Secondary | ICD-10-CM | POA: Diagnosis not present

## 2018-08-20 DIAGNOSIS — Z992 Dependence on renal dialysis: Secondary | ICD-10-CM | POA: Diagnosis not present

## 2018-08-20 DIAGNOSIS — N186 End stage renal disease: Secondary | ICD-10-CM | POA: Diagnosis not present

## 2018-08-21 DIAGNOSIS — M6281 Muscle weakness (generalized): Secondary | ICD-10-CM | POA: Diagnosis not present

## 2018-08-21 DIAGNOSIS — R262 Difficulty in walking, not elsewhere classified: Secondary | ICD-10-CM | POA: Diagnosis not present

## 2018-08-22 DIAGNOSIS — N186 End stage renal disease: Secondary | ICD-10-CM | POA: Diagnosis not present

## 2018-08-22 DIAGNOSIS — E119 Type 2 diabetes mellitus without complications: Secondary | ICD-10-CM | POA: Diagnosis not present

## 2018-08-22 DIAGNOSIS — N2581 Secondary hyperparathyroidism of renal origin: Secondary | ICD-10-CM | POA: Diagnosis not present

## 2018-08-22 DIAGNOSIS — D631 Anemia in chronic kidney disease: Secondary | ICD-10-CM | POA: Diagnosis not present

## 2018-08-23 DIAGNOSIS — M6281 Muscle weakness (generalized): Secondary | ICD-10-CM | POA: Diagnosis not present

## 2018-08-23 DIAGNOSIS — R262 Difficulty in walking, not elsewhere classified: Secondary | ICD-10-CM | POA: Diagnosis not present

## 2018-08-24 DIAGNOSIS — N2581 Secondary hyperparathyroidism of renal origin: Secondary | ICD-10-CM | POA: Diagnosis not present

## 2018-08-24 DIAGNOSIS — E119 Type 2 diabetes mellitus without complications: Secondary | ICD-10-CM | POA: Diagnosis not present

## 2018-08-24 DIAGNOSIS — N186 End stage renal disease: Secondary | ICD-10-CM | POA: Diagnosis not present

## 2018-08-24 DIAGNOSIS — M6281 Muscle weakness (generalized): Secondary | ICD-10-CM | POA: Diagnosis not present

## 2018-08-24 DIAGNOSIS — D631 Anemia in chronic kidney disease: Secondary | ICD-10-CM | POA: Diagnosis not present

## 2018-08-24 DIAGNOSIS — R262 Difficulty in walking, not elsewhere classified: Secondary | ICD-10-CM | POA: Diagnosis not present

## 2018-08-25 DIAGNOSIS — K219 Gastro-esophageal reflux disease without esophagitis: Secondary | ICD-10-CM | POA: Diagnosis not present

## 2018-08-25 DIAGNOSIS — I5040 Unspecified combined systolic (congestive) and diastolic (congestive) heart failure: Secondary | ICD-10-CM | POA: Diagnosis not present

## 2018-08-25 DIAGNOSIS — I10 Essential (primary) hypertension: Secondary | ICD-10-CM | POA: Diagnosis not present

## 2018-08-25 DIAGNOSIS — R262 Difficulty in walking, not elsewhere classified: Secondary | ICD-10-CM | POA: Diagnosis not present

## 2018-08-25 DIAGNOSIS — E559 Vitamin D deficiency, unspecified: Secondary | ICD-10-CM | POA: Diagnosis not present

## 2018-08-25 DIAGNOSIS — M6281 Muscle weakness (generalized): Secondary | ICD-10-CM | POA: Diagnosis not present

## 2018-08-26 DIAGNOSIS — R262 Difficulty in walking, not elsewhere classified: Secondary | ICD-10-CM | POA: Diagnosis not present

## 2018-08-26 DIAGNOSIS — N2581 Secondary hyperparathyroidism of renal origin: Secondary | ICD-10-CM | POA: Diagnosis not present

## 2018-08-26 DIAGNOSIS — E119 Type 2 diabetes mellitus without complications: Secondary | ICD-10-CM | POA: Diagnosis not present

## 2018-08-26 DIAGNOSIS — M6281 Muscle weakness (generalized): Secondary | ICD-10-CM | POA: Diagnosis not present

## 2018-08-26 DIAGNOSIS — D631 Anemia in chronic kidney disease: Secondary | ICD-10-CM | POA: Diagnosis not present

## 2018-08-26 DIAGNOSIS — N186 End stage renal disease: Secondary | ICD-10-CM | POA: Diagnosis not present

## 2018-08-27 DIAGNOSIS — M6281 Muscle weakness (generalized): Secondary | ICD-10-CM | POA: Diagnosis not present

## 2018-08-27 DIAGNOSIS — R262 Difficulty in walking, not elsewhere classified: Secondary | ICD-10-CM | POA: Diagnosis not present

## 2018-08-29 DIAGNOSIS — D631 Anemia in chronic kidney disease: Secondary | ICD-10-CM | POA: Diagnosis not present

## 2018-08-29 DIAGNOSIS — N186 End stage renal disease: Secondary | ICD-10-CM | POA: Diagnosis not present

## 2018-08-29 DIAGNOSIS — E119 Type 2 diabetes mellitus without complications: Secondary | ICD-10-CM | POA: Diagnosis not present

## 2018-08-29 DIAGNOSIS — N2581 Secondary hyperparathyroidism of renal origin: Secondary | ICD-10-CM | POA: Diagnosis not present

## 2018-08-30 DIAGNOSIS — M6281 Muscle weakness (generalized): Secondary | ICD-10-CM | POA: Diagnosis not present

## 2018-08-30 DIAGNOSIS — R262 Difficulty in walking, not elsewhere classified: Secondary | ICD-10-CM | POA: Diagnosis not present

## 2018-08-31 DIAGNOSIS — E119 Type 2 diabetes mellitus without complications: Secondary | ICD-10-CM | POA: Diagnosis not present

## 2018-08-31 DIAGNOSIS — N186 End stage renal disease: Secondary | ICD-10-CM | POA: Diagnosis not present

## 2018-08-31 DIAGNOSIS — D631 Anemia in chronic kidney disease: Secondary | ICD-10-CM | POA: Diagnosis not present

## 2018-08-31 DIAGNOSIS — M6281 Muscle weakness (generalized): Secondary | ICD-10-CM | POA: Diagnosis not present

## 2018-08-31 DIAGNOSIS — N2581 Secondary hyperparathyroidism of renal origin: Secondary | ICD-10-CM | POA: Diagnosis not present

## 2018-08-31 DIAGNOSIS — R262 Difficulty in walking, not elsewhere classified: Secondary | ICD-10-CM | POA: Diagnosis not present

## 2018-09-01 DIAGNOSIS — R262 Difficulty in walking, not elsewhere classified: Secondary | ICD-10-CM | POA: Diagnosis not present

## 2018-09-01 DIAGNOSIS — M6281 Muscle weakness (generalized): Secondary | ICD-10-CM | POA: Diagnosis not present

## 2018-09-02 DIAGNOSIS — R262 Difficulty in walking, not elsewhere classified: Secondary | ICD-10-CM | POA: Diagnosis not present

## 2018-09-02 DIAGNOSIS — M6281 Muscle weakness (generalized): Secondary | ICD-10-CM | POA: Diagnosis not present

## 2018-09-02 DIAGNOSIS — D631 Anemia in chronic kidney disease: Secondary | ICD-10-CM | POA: Diagnosis not present

## 2018-09-02 DIAGNOSIS — N2581 Secondary hyperparathyroidism of renal origin: Secondary | ICD-10-CM | POA: Diagnosis not present

## 2018-09-02 DIAGNOSIS — E119 Type 2 diabetes mellitus without complications: Secondary | ICD-10-CM | POA: Diagnosis not present

## 2018-09-02 DIAGNOSIS — N186 End stage renal disease: Secondary | ICD-10-CM | POA: Diagnosis not present

## 2018-09-03 DIAGNOSIS — M6281 Muscle weakness (generalized): Secondary | ICD-10-CM | POA: Diagnosis not present

## 2018-09-03 DIAGNOSIS — R262 Difficulty in walking, not elsewhere classified: Secondary | ICD-10-CM | POA: Diagnosis not present

## 2018-09-05 DIAGNOSIS — N186 End stage renal disease: Secondary | ICD-10-CM | POA: Diagnosis not present

## 2018-09-05 DIAGNOSIS — M6281 Muscle weakness (generalized): Secondary | ICD-10-CM | POA: Diagnosis not present

## 2018-09-05 DIAGNOSIS — E119 Type 2 diabetes mellitus without complications: Secondary | ICD-10-CM | POA: Diagnosis not present

## 2018-09-05 DIAGNOSIS — N2581 Secondary hyperparathyroidism of renal origin: Secondary | ICD-10-CM | POA: Diagnosis not present

## 2018-09-05 DIAGNOSIS — D631 Anemia in chronic kidney disease: Secondary | ICD-10-CM | POA: Diagnosis not present

## 2018-09-05 DIAGNOSIS — R262 Difficulty in walking, not elsewhere classified: Secondary | ICD-10-CM | POA: Diagnosis not present

## 2018-09-06 DIAGNOSIS — R262 Difficulty in walking, not elsewhere classified: Secondary | ICD-10-CM | POA: Diagnosis not present

## 2018-09-06 DIAGNOSIS — M6281 Muscle weakness (generalized): Secondary | ICD-10-CM | POA: Diagnosis not present

## 2018-09-07 DIAGNOSIS — D631 Anemia in chronic kidney disease: Secondary | ICD-10-CM | POA: Diagnosis not present

## 2018-09-07 DIAGNOSIS — N186 End stage renal disease: Secondary | ICD-10-CM | POA: Diagnosis not present

## 2018-09-07 DIAGNOSIS — N2581 Secondary hyperparathyroidism of renal origin: Secondary | ICD-10-CM | POA: Diagnosis not present

## 2018-09-07 DIAGNOSIS — E119 Type 2 diabetes mellitus without complications: Secondary | ICD-10-CM | POA: Diagnosis not present

## 2018-09-09 DIAGNOSIS — N186 End stage renal disease: Secondary | ICD-10-CM | POA: Diagnosis not present

## 2018-09-09 DIAGNOSIS — E119 Type 2 diabetes mellitus without complications: Secondary | ICD-10-CM | POA: Diagnosis not present

## 2018-09-09 DIAGNOSIS — D631 Anemia in chronic kidney disease: Secondary | ICD-10-CM | POA: Diagnosis not present

## 2018-09-09 DIAGNOSIS — N2581 Secondary hyperparathyroidism of renal origin: Secondary | ICD-10-CM | POA: Diagnosis not present

## 2018-09-12 DIAGNOSIS — N2581 Secondary hyperparathyroidism of renal origin: Secondary | ICD-10-CM | POA: Diagnosis not present

## 2018-09-12 DIAGNOSIS — M6281 Muscle weakness (generalized): Secondary | ICD-10-CM | POA: Diagnosis not present

## 2018-09-12 DIAGNOSIS — N186 End stage renal disease: Secondary | ICD-10-CM | POA: Diagnosis not present

## 2018-09-12 DIAGNOSIS — D631 Anemia in chronic kidney disease: Secondary | ICD-10-CM | POA: Diagnosis not present

## 2018-09-12 DIAGNOSIS — R262 Difficulty in walking, not elsewhere classified: Secondary | ICD-10-CM | POA: Diagnosis not present

## 2018-09-12 DIAGNOSIS — E119 Type 2 diabetes mellitus without complications: Secondary | ICD-10-CM | POA: Diagnosis not present

## 2018-09-13 DIAGNOSIS — M6281 Muscle weakness (generalized): Secondary | ICD-10-CM | POA: Diagnosis not present

## 2018-09-13 DIAGNOSIS — R262 Difficulty in walking, not elsewhere classified: Secondary | ICD-10-CM | POA: Diagnosis not present

## 2018-09-14 DIAGNOSIS — D631 Anemia in chronic kidney disease: Secondary | ICD-10-CM | POA: Diagnosis not present

## 2018-09-14 DIAGNOSIS — R262 Difficulty in walking, not elsewhere classified: Secondary | ICD-10-CM | POA: Diagnosis not present

## 2018-09-14 DIAGNOSIS — N2581 Secondary hyperparathyroidism of renal origin: Secondary | ICD-10-CM | POA: Diagnosis not present

## 2018-09-14 DIAGNOSIS — E119 Type 2 diabetes mellitus without complications: Secondary | ICD-10-CM | POA: Diagnosis not present

## 2018-09-14 DIAGNOSIS — N186 End stage renal disease: Secondary | ICD-10-CM | POA: Diagnosis not present

## 2018-09-14 DIAGNOSIS — M6281 Muscle weakness (generalized): Secondary | ICD-10-CM | POA: Diagnosis not present

## 2018-09-16 DIAGNOSIS — N186 End stage renal disease: Secondary | ICD-10-CM | POA: Diagnosis not present

## 2018-09-16 DIAGNOSIS — E119 Type 2 diabetes mellitus without complications: Secondary | ICD-10-CM | POA: Diagnosis not present

## 2018-09-16 DIAGNOSIS — D631 Anemia in chronic kidney disease: Secondary | ICD-10-CM | POA: Diagnosis not present

## 2018-09-16 DIAGNOSIS — N2581 Secondary hyperparathyroidism of renal origin: Secondary | ICD-10-CM | POA: Diagnosis not present

## 2018-09-18 DIAGNOSIS — E1142 Type 2 diabetes mellitus with diabetic polyneuropathy: Secondary | ICD-10-CM | POA: Diagnosis not present

## 2018-09-18 DIAGNOSIS — E119 Type 2 diabetes mellitus without complications: Secondary | ICD-10-CM | POA: Diagnosis not present

## 2018-09-18 DIAGNOSIS — Z992 Dependence on renal dialysis: Secondary | ICD-10-CM | POA: Diagnosis not present

## 2018-09-18 DIAGNOSIS — I5040 Unspecified combined systolic (congestive) and diastolic (congestive) heart failure: Secondary | ICD-10-CM | POA: Diagnosis not present

## 2018-09-18 DIAGNOSIS — K219 Gastro-esophageal reflux disease without esophagitis: Secondary | ICD-10-CM | POA: Diagnosis not present

## 2018-09-18 DIAGNOSIS — E1129 Type 2 diabetes mellitus with other diabetic kidney complication: Secondary | ICD-10-CM | POA: Diagnosis not present

## 2018-09-18 DIAGNOSIS — N186 End stage renal disease: Secondary | ICD-10-CM | POA: Diagnosis not present

## 2018-09-19 DIAGNOSIS — D509 Iron deficiency anemia, unspecified: Secondary | ICD-10-CM | POA: Diagnosis not present

## 2018-09-19 DIAGNOSIS — K219 Gastro-esophageal reflux disease without esophagitis: Secondary | ICD-10-CM | POA: Diagnosis not present

## 2018-09-19 DIAGNOSIS — I5040 Unspecified combined systolic (congestive) and diastolic (congestive) heart failure: Secondary | ICD-10-CM | POA: Diagnosis not present

## 2018-09-19 DIAGNOSIS — N2581 Secondary hyperparathyroidism of renal origin: Secondary | ICD-10-CM | POA: Diagnosis not present

## 2018-09-19 DIAGNOSIS — N186 End stage renal disease: Secondary | ICD-10-CM | POA: Diagnosis not present

## 2018-09-19 DIAGNOSIS — E1142 Type 2 diabetes mellitus with diabetic polyneuropathy: Secondary | ICD-10-CM | POA: Diagnosis not present

## 2018-09-19 DIAGNOSIS — I120 Hypertensive chronic kidney disease with stage 5 chronic kidney disease or end stage renal disease: Secondary | ICD-10-CM | POA: Diagnosis not present

## 2018-09-19 DIAGNOSIS — D631 Anemia in chronic kidney disease: Secondary | ICD-10-CM | POA: Diagnosis not present

## 2018-09-19 DIAGNOSIS — E119 Type 2 diabetes mellitus without complications: Secondary | ICD-10-CM | POA: Diagnosis not present

## 2018-09-21 DIAGNOSIS — N2581 Secondary hyperparathyroidism of renal origin: Secondary | ICD-10-CM | POA: Diagnosis not present

## 2018-09-21 DIAGNOSIS — E119 Type 2 diabetes mellitus without complications: Secondary | ICD-10-CM | POA: Diagnosis not present

## 2018-09-21 DIAGNOSIS — D631 Anemia in chronic kidney disease: Secondary | ICD-10-CM | POA: Diagnosis not present

## 2018-09-21 DIAGNOSIS — D509 Iron deficiency anemia, unspecified: Secondary | ICD-10-CM | POA: Diagnosis not present

## 2018-09-21 DIAGNOSIS — N186 End stage renal disease: Secondary | ICD-10-CM | POA: Diagnosis not present

## 2018-09-23 DIAGNOSIS — D509 Iron deficiency anemia, unspecified: Secondary | ICD-10-CM | POA: Diagnosis not present

## 2018-09-23 DIAGNOSIS — N2581 Secondary hyperparathyroidism of renal origin: Secondary | ICD-10-CM | POA: Diagnosis not present

## 2018-09-23 DIAGNOSIS — D631 Anemia in chronic kidney disease: Secondary | ICD-10-CM | POA: Diagnosis not present

## 2018-09-23 DIAGNOSIS — E119 Type 2 diabetes mellitus without complications: Secondary | ICD-10-CM | POA: Diagnosis not present

## 2018-09-23 DIAGNOSIS — N186 End stage renal disease: Secondary | ICD-10-CM | POA: Diagnosis not present

## 2018-09-26 DIAGNOSIS — N2581 Secondary hyperparathyroidism of renal origin: Secondary | ICD-10-CM | POA: Diagnosis not present

## 2018-09-26 DIAGNOSIS — N186 End stage renal disease: Secondary | ICD-10-CM | POA: Diagnosis not present

## 2018-09-26 DIAGNOSIS — D509 Iron deficiency anemia, unspecified: Secondary | ICD-10-CM | POA: Diagnosis not present

## 2018-09-26 DIAGNOSIS — E119 Type 2 diabetes mellitus without complications: Secondary | ICD-10-CM | POA: Diagnosis not present

## 2018-09-26 DIAGNOSIS — D631 Anemia in chronic kidney disease: Secondary | ICD-10-CM | POA: Diagnosis not present

## 2018-09-27 DIAGNOSIS — Z79899 Other long term (current) drug therapy: Secondary | ICD-10-CM | POA: Diagnosis not present

## 2018-09-27 DIAGNOSIS — D649 Anemia, unspecified: Secondary | ICD-10-CM | POA: Diagnosis not present

## 2018-09-27 DIAGNOSIS — E559 Vitamin D deficiency, unspecified: Secondary | ICD-10-CM | POA: Diagnosis not present

## 2018-09-27 DIAGNOSIS — E119 Type 2 diabetes mellitus without complications: Secondary | ICD-10-CM | POA: Diagnosis not present

## 2018-09-28 DIAGNOSIS — N186 End stage renal disease: Secondary | ICD-10-CM | POA: Diagnosis not present

## 2018-09-28 DIAGNOSIS — D631 Anemia in chronic kidney disease: Secondary | ICD-10-CM | POA: Diagnosis not present

## 2018-09-28 DIAGNOSIS — D509 Iron deficiency anemia, unspecified: Secondary | ICD-10-CM | POA: Diagnosis not present

## 2018-09-28 DIAGNOSIS — N2581 Secondary hyperparathyroidism of renal origin: Secondary | ICD-10-CM | POA: Diagnosis not present

## 2018-09-28 DIAGNOSIS — E119 Type 2 diabetes mellitus without complications: Secondary | ICD-10-CM | POA: Diagnosis not present

## 2018-09-30 DIAGNOSIS — N186 End stage renal disease: Secondary | ICD-10-CM | POA: Diagnosis not present

## 2018-09-30 DIAGNOSIS — N2581 Secondary hyperparathyroidism of renal origin: Secondary | ICD-10-CM | POA: Diagnosis not present

## 2018-09-30 DIAGNOSIS — E119 Type 2 diabetes mellitus without complications: Secondary | ICD-10-CM | POA: Diagnosis not present

## 2018-09-30 DIAGNOSIS — D631 Anemia in chronic kidney disease: Secondary | ICD-10-CM | POA: Diagnosis not present

## 2018-09-30 DIAGNOSIS — D509 Iron deficiency anemia, unspecified: Secondary | ICD-10-CM | POA: Diagnosis not present

## 2018-10-03 DIAGNOSIS — D509 Iron deficiency anemia, unspecified: Secondary | ICD-10-CM | POA: Diagnosis not present

## 2018-10-03 DIAGNOSIS — N2581 Secondary hyperparathyroidism of renal origin: Secondary | ICD-10-CM | POA: Diagnosis not present

## 2018-10-03 DIAGNOSIS — D631 Anemia in chronic kidney disease: Secondary | ICD-10-CM | POA: Diagnosis not present

## 2018-10-03 DIAGNOSIS — N186 End stage renal disease: Secondary | ICD-10-CM | POA: Diagnosis not present

## 2018-10-03 DIAGNOSIS — E119 Type 2 diabetes mellitus without complications: Secondary | ICD-10-CM | POA: Diagnosis not present

## 2018-10-05 DIAGNOSIS — D509 Iron deficiency anemia, unspecified: Secondary | ICD-10-CM | POA: Diagnosis not present

## 2018-10-05 DIAGNOSIS — E119 Type 2 diabetes mellitus without complications: Secondary | ICD-10-CM | POA: Diagnosis not present

## 2018-10-05 DIAGNOSIS — N186 End stage renal disease: Secondary | ICD-10-CM | POA: Diagnosis not present

## 2018-10-05 DIAGNOSIS — D631 Anemia in chronic kidney disease: Secondary | ICD-10-CM | POA: Diagnosis not present

## 2018-10-05 DIAGNOSIS — N2581 Secondary hyperparathyroidism of renal origin: Secondary | ICD-10-CM | POA: Diagnosis not present

## 2018-10-07 DIAGNOSIS — N2581 Secondary hyperparathyroidism of renal origin: Secondary | ICD-10-CM | POA: Diagnosis not present

## 2018-10-07 DIAGNOSIS — D509 Iron deficiency anemia, unspecified: Secondary | ICD-10-CM | POA: Diagnosis not present

## 2018-10-07 DIAGNOSIS — N186 End stage renal disease: Secondary | ICD-10-CM | POA: Diagnosis not present

## 2018-10-07 DIAGNOSIS — D631 Anemia in chronic kidney disease: Secondary | ICD-10-CM | POA: Diagnosis not present

## 2018-10-07 DIAGNOSIS — E119 Type 2 diabetes mellitus without complications: Secondary | ICD-10-CM | POA: Diagnosis not present

## 2018-10-10 DIAGNOSIS — D631 Anemia in chronic kidney disease: Secondary | ICD-10-CM | POA: Diagnosis not present

## 2018-10-10 DIAGNOSIS — E119 Type 2 diabetes mellitus without complications: Secondary | ICD-10-CM | POA: Diagnosis not present

## 2018-10-10 DIAGNOSIS — D509 Iron deficiency anemia, unspecified: Secondary | ICD-10-CM | POA: Diagnosis not present

## 2018-10-10 DIAGNOSIS — N2581 Secondary hyperparathyroidism of renal origin: Secondary | ICD-10-CM | POA: Diagnosis not present

## 2018-10-10 DIAGNOSIS — N186 End stage renal disease: Secondary | ICD-10-CM | POA: Diagnosis not present

## 2018-10-12 DIAGNOSIS — N2581 Secondary hyperparathyroidism of renal origin: Secondary | ICD-10-CM | POA: Diagnosis not present

## 2018-10-12 DIAGNOSIS — N186 End stage renal disease: Secondary | ICD-10-CM | POA: Diagnosis not present

## 2018-10-12 DIAGNOSIS — D631 Anemia in chronic kidney disease: Secondary | ICD-10-CM | POA: Diagnosis not present

## 2018-10-12 DIAGNOSIS — E119 Type 2 diabetes mellitus without complications: Secondary | ICD-10-CM | POA: Diagnosis not present

## 2018-10-12 DIAGNOSIS — D509 Iron deficiency anemia, unspecified: Secondary | ICD-10-CM | POA: Diagnosis not present

## 2018-10-14 DIAGNOSIS — D509 Iron deficiency anemia, unspecified: Secondary | ICD-10-CM | POA: Diagnosis not present

## 2018-10-14 DIAGNOSIS — N186 End stage renal disease: Secondary | ICD-10-CM | POA: Diagnosis not present

## 2018-10-14 DIAGNOSIS — N2581 Secondary hyperparathyroidism of renal origin: Secondary | ICD-10-CM | POA: Diagnosis not present

## 2018-10-14 DIAGNOSIS — E119 Type 2 diabetes mellitus without complications: Secondary | ICD-10-CM | POA: Diagnosis not present

## 2018-10-14 DIAGNOSIS — D631 Anemia in chronic kidney disease: Secondary | ICD-10-CM | POA: Diagnosis not present

## 2018-10-17 DIAGNOSIS — I5040 Unspecified combined systolic (congestive) and diastolic (congestive) heart failure: Secondary | ICD-10-CM | POA: Diagnosis not present

## 2018-10-17 DIAGNOSIS — E1142 Type 2 diabetes mellitus with diabetic polyneuropathy: Secondary | ICD-10-CM | POA: Diagnosis not present

## 2018-10-17 DIAGNOSIS — E119 Type 2 diabetes mellitus without complications: Secondary | ICD-10-CM | POA: Diagnosis not present

## 2018-10-17 DIAGNOSIS — D509 Iron deficiency anemia, unspecified: Secondary | ICD-10-CM | POA: Diagnosis not present

## 2018-10-17 DIAGNOSIS — N2581 Secondary hyperparathyroidism of renal origin: Secondary | ICD-10-CM | POA: Diagnosis not present

## 2018-10-17 DIAGNOSIS — D631 Anemia in chronic kidney disease: Secondary | ICD-10-CM | POA: Diagnosis not present

## 2018-10-17 DIAGNOSIS — I120 Hypertensive chronic kidney disease with stage 5 chronic kidney disease or end stage renal disease: Secondary | ICD-10-CM | POA: Diagnosis not present

## 2018-10-17 DIAGNOSIS — N186 End stage renal disease: Secondary | ICD-10-CM | POA: Diagnosis not present

## 2018-10-17 DIAGNOSIS — E559 Vitamin D deficiency, unspecified: Secondary | ICD-10-CM | POA: Diagnosis not present

## 2018-10-19 DIAGNOSIS — E119 Type 2 diabetes mellitus without complications: Secondary | ICD-10-CM | POA: Diagnosis not present

## 2018-10-19 DIAGNOSIS — N186 End stage renal disease: Secondary | ICD-10-CM | POA: Diagnosis not present

## 2018-10-19 DIAGNOSIS — E1129 Type 2 diabetes mellitus with other diabetic kidney complication: Secondary | ICD-10-CM | POA: Diagnosis not present

## 2018-10-19 DIAGNOSIS — Z992 Dependence on renal dialysis: Secondary | ICD-10-CM | POA: Diagnosis not present

## 2018-10-19 DIAGNOSIS — D631 Anemia in chronic kidney disease: Secondary | ICD-10-CM | POA: Diagnosis not present

## 2018-10-19 DIAGNOSIS — N2581 Secondary hyperparathyroidism of renal origin: Secondary | ICD-10-CM | POA: Diagnosis not present

## 2018-10-21 DIAGNOSIS — N186 End stage renal disease: Secondary | ICD-10-CM | POA: Diagnosis not present

## 2018-10-21 DIAGNOSIS — E119 Type 2 diabetes mellitus without complications: Secondary | ICD-10-CM | POA: Diagnosis not present

## 2018-10-21 DIAGNOSIS — N2581 Secondary hyperparathyroidism of renal origin: Secondary | ICD-10-CM | POA: Diagnosis not present

## 2018-10-21 DIAGNOSIS — D631 Anemia in chronic kidney disease: Secondary | ICD-10-CM | POA: Diagnosis not present

## 2018-10-24 DIAGNOSIS — E119 Type 2 diabetes mellitus without complications: Secondary | ICD-10-CM | POA: Diagnosis not present

## 2018-10-24 DIAGNOSIS — D631 Anemia in chronic kidney disease: Secondary | ICD-10-CM | POA: Diagnosis not present

## 2018-10-24 DIAGNOSIS — N186 End stage renal disease: Secondary | ICD-10-CM | POA: Diagnosis not present

## 2018-10-24 DIAGNOSIS — N2581 Secondary hyperparathyroidism of renal origin: Secondary | ICD-10-CM | POA: Diagnosis not present

## 2018-10-26 DIAGNOSIS — N186 End stage renal disease: Secondary | ICD-10-CM | POA: Diagnosis not present

## 2018-10-26 DIAGNOSIS — N2581 Secondary hyperparathyroidism of renal origin: Secondary | ICD-10-CM | POA: Diagnosis not present

## 2018-10-26 DIAGNOSIS — E119 Type 2 diabetes mellitus without complications: Secondary | ICD-10-CM | POA: Diagnosis not present

## 2018-10-26 DIAGNOSIS — D631 Anemia in chronic kidney disease: Secondary | ICD-10-CM | POA: Diagnosis not present

## 2018-10-28 DIAGNOSIS — N186 End stage renal disease: Secondary | ICD-10-CM | POA: Diagnosis not present

## 2018-10-28 DIAGNOSIS — N2581 Secondary hyperparathyroidism of renal origin: Secondary | ICD-10-CM | POA: Diagnosis not present

## 2018-10-28 DIAGNOSIS — D631 Anemia in chronic kidney disease: Secondary | ICD-10-CM | POA: Diagnosis not present

## 2018-10-28 DIAGNOSIS — E119 Type 2 diabetes mellitus without complications: Secondary | ICD-10-CM | POA: Diagnosis not present

## 2018-10-30 DIAGNOSIS — I120 Hypertensive chronic kidney disease with stage 5 chronic kidney disease or end stage renal disease: Secondary | ICD-10-CM | POA: Diagnosis not present

## 2018-10-30 DIAGNOSIS — I25118 Atherosclerotic heart disease of native coronary artery with other forms of angina pectoris: Secondary | ICD-10-CM | POA: Diagnosis not present

## 2018-10-30 DIAGNOSIS — E1142 Type 2 diabetes mellitus with diabetic polyneuropathy: Secondary | ICD-10-CM | POA: Diagnosis not present

## 2018-10-30 DIAGNOSIS — I5040 Unspecified combined systolic (congestive) and diastolic (congestive) heart failure: Secondary | ICD-10-CM | POA: Diagnosis not present

## 2018-10-31 DIAGNOSIS — E119 Type 2 diabetes mellitus without complications: Secondary | ICD-10-CM | POA: Diagnosis not present

## 2018-10-31 DIAGNOSIS — N2581 Secondary hyperparathyroidism of renal origin: Secondary | ICD-10-CM | POA: Diagnosis not present

## 2018-10-31 DIAGNOSIS — D631 Anemia in chronic kidney disease: Secondary | ICD-10-CM | POA: Diagnosis not present

## 2018-10-31 DIAGNOSIS — N186 End stage renal disease: Secondary | ICD-10-CM | POA: Diagnosis not present

## 2018-11-02 DIAGNOSIS — N2581 Secondary hyperparathyroidism of renal origin: Secondary | ICD-10-CM | POA: Diagnosis not present

## 2018-11-02 DIAGNOSIS — D631 Anemia in chronic kidney disease: Secondary | ICD-10-CM | POA: Diagnosis not present

## 2018-11-02 DIAGNOSIS — N186 End stage renal disease: Secondary | ICD-10-CM | POA: Diagnosis not present

## 2018-11-02 DIAGNOSIS — E119 Type 2 diabetes mellitus without complications: Secondary | ICD-10-CM | POA: Diagnosis not present

## 2018-11-04 DIAGNOSIS — D631 Anemia in chronic kidney disease: Secondary | ICD-10-CM | POA: Diagnosis not present

## 2018-11-04 DIAGNOSIS — N186 End stage renal disease: Secondary | ICD-10-CM | POA: Diagnosis not present

## 2018-11-04 DIAGNOSIS — E119 Type 2 diabetes mellitus without complications: Secondary | ICD-10-CM | POA: Diagnosis not present

## 2018-11-04 DIAGNOSIS — N2581 Secondary hyperparathyroidism of renal origin: Secondary | ICD-10-CM | POA: Diagnosis not present

## 2018-11-07 DIAGNOSIS — E119 Type 2 diabetes mellitus without complications: Secondary | ICD-10-CM | POA: Diagnosis not present

## 2018-11-07 DIAGNOSIS — N186 End stage renal disease: Secondary | ICD-10-CM | POA: Diagnosis not present

## 2018-11-07 DIAGNOSIS — D631 Anemia in chronic kidney disease: Secondary | ICD-10-CM | POA: Diagnosis not present

## 2018-11-07 DIAGNOSIS — N2581 Secondary hyperparathyroidism of renal origin: Secondary | ICD-10-CM | POA: Diagnosis not present

## 2018-11-09 DIAGNOSIS — D631 Anemia in chronic kidney disease: Secondary | ICD-10-CM | POA: Diagnosis not present

## 2018-11-09 DIAGNOSIS — N2581 Secondary hyperparathyroidism of renal origin: Secondary | ICD-10-CM | POA: Diagnosis not present

## 2018-11-09 DIAGNOSIS — N186 End stage renal disease: Secondary | ICD-10-CM | POA: Diagnosis not present

## 2018-11-09 DIAGNOSIS — E119 Type 2 diabetes mellitus without complications: Secondary | ICD-10-CM | POA: Diagnosis not present

## 2018-11-11 DIAGNOSIS — E119 Type 2 diabetes mellitus without complications: Secondary | ICD-10-CM | POA: Diagnosis not present

## 2018-11-11 DIAGNOSIS — D631 Anemia in chronic kidney disease: Secondary | ICD-10-CM | POA: Diagnosis not present

## 2018-11-11 DIAGNOSIS — N2581 Secondary hyperparathyroidism of renal origin: Secondary | ICD-10-CM | POA: Diagnosis not present

## 2018-11-11 DIAGNOSIS — N186 End stage renal disease: Secondary | ICD-10-CM | POA: Diagnosis not present

## 2018-11-14 DIAGNOSIS — E119 Type 2 diabetes mellitus without complications: Secondary | ICD-10-CM | POA: Diagnosis not present

## 2018-11-14 DIAGNOSIS — N2581 Secondary hyperparathyroidism of renal origin: Secondary | ICD-10-CM | POA: Diagnosis not present

## 2018-11-14 DIAGNOSIS — D631 Anemia in chronic kidney disease: Secondary | ICD-10-CM | POA: Diagnosis not present

## 2018-11-14 DIAGNOSIS — N186 End stage renal disease: Secondary | ICD-10-CM | POA: Diagnosis not present

## 2018-11-15 DIAGNOSIS — R262 Difficulty in walking, not elsewhere classified: Secondary | ICD-10-CM | POA: Diagnosis not present

## 2018-11-16 DIAGNOSIS — N186 End stage renal disease: Secondary | ICD-10-CM | POA: Diagnosis not present

## 2018-11-16 DIAGNOSIS — N2581 Secondary hyperparathyroidism of renal origin: Secondary | ICD-10-CM | POA: Diagnosis not present

## 2018-11-16 DIAGNOSIS — E119 Type 2 diabetes mellitus without complications: Secondary | ICD-10-CM | POA: Diagnosis not present

## 2018-11-16 DIAGNOSIS — D631 Anemia in chronic kidney disease: Secondary | ICD-10-CM | POA: Diagnosis not present

## 2018-11-17 DIAGNOSIS — N186 End stage renal disease: Secondary | ICD-10-CM | POA: Diagnosis not present

## 2018-11-17 DIAGNOSIS — I25118 Atherosclerotic heart disease of native coronary artery with other forms of angina pectoris: Secondary | ICD-10-CM | POA: Diagnosis not present

## 2018-11-17 DIAGNOSIS — I5040 Unspecified combined systolic (congestive) and diastolic (congestive) heart failure: Secondary | ICD-10-CM | POA: Diagnosis not present

## 2018-11-17 DIAGNOSIS — I120 Hypertensive chronic kidney disease with stage 5 chronic kidney disease or end stage renal disease: Secondary | ICD-10-CM | POA: Diagnosis not present

## 2018-11-18 DIAGNOSIS — E1129 Type 2 diabetes mellitus with other diabetic kidney complication: Secondary | ICD-10-CM | POA: Diagnosis not present

## 2018-11-18 DIAGNOSIS — N2581 Secondary hyperparathyroidism of renal origin: Secondary | ICD-10-CM | POA: Diagnosis not present

## 2018-11-18 DIAGNOSIS — Z992 Dependence on renal dialysis: Secondary | ICD-10-CM | POA: Diagnosis not present

## 2018-11-18 DIAGNOSIS — N186 End stage renal disease: Secondary | ICD-10-CM | POA: Diagnosis not present

## 2018-11-18 DIAGNOSIS — E119 Type 2 diabetes mellitus without complications: Secondary | ICD-10-CM | POA: Diagnosis not present

## 2018-11-21 DIAGNOSIS — N186 End stage renal disease: Secondary | ICD-10-CM | POA: Diagnosis not present

## 2018-11-21 DIAGNOSIS — E119 Type 2 diabetes mellitus without complications: Secondary | ICD-10-CM | POA: Diagnosis not present

## 2018-11-21 DIAGNOSIS — N2581 Secondary hyperparathyroidism of renal origin: Secondary | ICD-10-CM | POA: Diagnosis not present

## 2018-11-23 DIAGNOSIS — N186 End stage renal disease: Secondary | ICD-10-CM | POA: Diagnosis not present

## 2018-11-23 DIAGNOSIS — N2581 Secondary hyperparathyroidism of renal origin: Secondary | ICD-10-CM | POA: Diagnosis not present

## 2018-11-23 DIAGNOSIS — E119 Type 2 diabetes mellitus without complications: Secondary | ICD-10-CM | POA: Diagnosis not present

## 2018-11-25 DIAGNOSIS — E119 Type 2 diabetes mellitus without complications: Secondary | ICD-10-CM | POA: Diagnosis not present

## 2018-11-25 DIAGNOSIS — N186 End stage renal disease: Secondary | ICD-10-CM | POA: Diagnosis not present

## 2018-11-25 DIAGNOSIS — N2581 Secondary hyperparathyroidism of renal origin: Secondary | ICD-10-CM | POA: Diagnosis not present

## 2018-11-28 DIAGNOSIS — E119 Type 2 diabetes mellitus without complications: Secondary | ICD-10-CM | POA: Diagnosis not present

## 2018-11-28 DIAGNOSIS — N2581 Secondary hyperparathyroidism of renal origin: Secondary | ICD-10-CM | POA: Diagnosis not present

## 2018-11-28 DIAGNOSIS — N186 End stage renal disease: Secondary | ICD-10-CM | POA: Diagnosis not present

## 2018-11-30 DIAGNOSIS — N2581 Secondary hyperparathyroidism of renal origin: Secondary | ICD-10-CM | POA: Diagnosis not present

## 2018-11-30 DIAGNOSIS — I25118 Atherosclerotic heart disease of native coronary artery with other forms of angina pectoris: Secondary | ICD-10-CM | POA: Diagnosis not present

## 2018-11-30 DIAGNOSIS — I5040 Unspecified combined systolic (congestive) and diastolic (congestive) heart failure: Secondary | ICD-10-CM | POA: Diagnosis not present

## 2018-11-30 DIAGNOSIS — E119 Type 2 diabetes mellitus without complications: Secondary | ICD-10-CM | POA: Diagnosis not present

## 2018-11-30 DIAGNOSIS — I120 Hypertensive chronic kidney disease with stage 5 chronic kidney disease or end stage renal disease: Secondary | ICD-10-CM | POA: Diagnosis not present

## 2018-11-30 DIAGNOSIS — E1142 Type 2 diabetes mellitus with diabetic polyneuropathy: Secondary | ICD-10-CM | POA: Diagnosis not present

## 2018-11-30 DIAGNOSIS — N186 End stage renal disease: Secondary | ICD-10-CM | POA: Diagnosis not present

## 2018-12-02 DIAGNOSIS — N186 End stage renal disease: Secondary | ICD-10-CM | POA: Diagnosis not present

## 2018-12-02 DIAGNOSIS — E119 Type 2 diabetes mellitus without complications: Secondary | ICD-10-CM | POA: Diagnosis not present

## 2018-12-02 DIAGNOSIS — N2581 Secondary hyperparathyroidism of renal origin: Secondary | ICD-10-CM | POA: Diagnosis not present

## 2018-12-05 DIAGNOSIS — N186 End stage renal disease: Secondary | ICD-10-CM | POA: Diagnosis not present

## 2018-12-05 DIAGNOSIS — E119 Type 2 diabetes mellitus without complications: Secondary | ICD-10-CM | POA: Diagnosis not present

## 2018-12-05 DIAGNOSIS — N2581 Secondary hyperparathyroidism of renal origin: Secondary | ICD-10-CM | POA: Diagnosis not present

## 2018-12-06 DIAGNOSIS — M6281 Muscle weakness (generalized): Secondary | ICD-10-CM | POA: Diagnosis not present

## 2018-12-06 DIAGNOSIS — Z9181 History of falling: Secondary | ICD-10-CM | POA: Diagnosis not present

## 2018-12-06 DIAGNOSIS — R262 Difficulty in walking, not elsewhere classified: Secondary | ICD-10-CM | POA: Diagnosis not present

## 2018-12-07 DIAGNOSIS — N186 End stage renal disease: Secondary | ICD-10-CM | POA: Diagnosis not present

## 2018-12-07 DIAGNOSIS — E119 Type 2 diabetes mellitus without complications: Secondary | ICD-10-CM | POA: Diagnosis not present

## 2018-12-07 DIAGNOSIS — N2581 Secondary hyperparathyroidism of renal origin: Secondary | ICD-10-CM | POA: Diagnosis not present

## 2018-12-08 DIAGNOSIS — R262 Difficulty in walking, not elsewhere classified: Secondary | ICD-10-CM | POA: Diagnosis not present

## 2018-12-08 DIAGNOSIS — Z9181 History of falling: Secondary | ICD-10-CM | POA: Diagnosis not present

## 2018-12-08 DIAGNOSIS — D649 Anemia, unspecified: Secondary | ICD-10-CM | POA: Diagnosis not present

## 2018-12-08 DIAGNOSIS — R1110 Vomiting, unspecified: Secondary | ICD-10-CM | POA: Diagnosis not present

## 2018-12-08 DIAGNOSIS — M6281 Muscle weakness (generalized): Secondary | ICD-10-CM | POA: Diagnosis not present

## 2018-12-09 DIAGNOSIS — R262 Difficulty in walking, not elsewhere classified: Secondary | ICD-10-CM | POA: Diagnosis not present

## 2018-12-09 DIAGNOSIS — N186 End stage renal disease: Secondary | ICD-10-CM | POA: Diagnosis not present

## 2018-12-09 DIAGNOSIS — I739 Peripheral vascular disease, unspecified: Secondary | ICD-10-CM | POA: Diagnosis not present

## 2018-12-09 DIAGNOSIS — B351 Tinea unguium: Secondary | ICD-10-CM | POA: Diagnosis not present

## 2018-12-09 DIAGNOSIS — N2581 Secondary hyperparathyroidism of renal origin: Secondary | ICD-10-CM | POA: Diagnosis not present

## 2018-12-09 DIAGNOSIS — E119 Type 2 diabetes mellitus without complications: Secondary | ICD-10-CM | POA: Diagnosis not present

## 2018-12-09 DIAGNOSIS — M6281 Muscle weakness (generalized): Secondary | ICD-10-CM | POA: Diagnosis not present

## 2018-12-09 DIAGNOSIS — Z9181 History of falling: Secondary | ICD-10-CM | POA: Diagnosis not present

## 2018-12-10 DIAGNOSIS — M6281 Muscle weakness (generalized): Secondary | ICD-10-CM | POA: Diagnosis not present

## 2018-12-10 DIAGNOSIS — Z9181 History of falling: Secondary | ICD-10-CM | POA: Diagnosis not present

## 2018-12-10 DIAGNOSIS — R262 Difficulty in walking, not elsewhere classified: Secondary | ICD-10-CM | POA: Diagnosis not present

## 2018-12-11 DIAGNOSIS — R262 Difficulty in walking, not elsewhere classified: Secondary | ICD-10-CM | POA: Diagnosis not present

## 2018-12-11 DIAGNOSIS — M6281 Muscle weakness (generalized): Secondary | ICD-10-CM | POA: Diagnosis not present

## 2018-12-11 DIAGNOSIS — Z9181 History of falling: Secondary | ICD-10-CM | POA: Diagnosis not present

## 2018-12-12 DIAGNOSIS — M6281 Muscle weakness (generalized): Secondary | ICD-10-CM | POA: Diagnosis not present

## 2018-12-12 DIAGNOSIS — R262 Difficulty in walking, not elsewhere classified: Secondary | ICD-10-CM | POA: Diagnosis not present

## 2018-12-12 DIAGNOSIS — Z9181 History of falling: Secondary | ICD-10-CM | POA: Diagnosis not present

## 2018-12-12 DIAGNOSIS — N186 End stage renal disease: Secondary | ICD-10-CM | POA: Diagnosis not present

## 2018-12-12 DIAGNOSIS — E119 Type 2 diabetes mellitus without complications: Secondary | ICD-10-CM | POA: Diagnosis not present

## 2018-12-12 DIAGNOSIS — N2581 Secondary hyperparathyroidism of renal origin: Secondary | ICD-10-CM | POA: Diagnosis not present

## 2018-12-13 DIAGNOSIS — M6281 Muscle weakness (generalized): Secondary | ICD-10-CM | POA: Diagnosis not present

## 2018-12-13 DIAGNOSIS — Z9181 History of falling: Secondary | ICD-10-CM | POA: Diagnosis not present

## 2018-12-13 DIAGNOSIS — R262 Difficulty in walking, not elsewhere classified: Secondary | ICD-10-CM | POA: Diagnosis not present

## 2018-12-14 DIAGNOSIS — E119 Type 2 diabetes mellitus without complications: Secondary | ICD-10-CM | POA: Diagnosis not present

## 2018-12-14 DIAGNOSIS — N186 End stage renal disease: Secondary | ICD-10-CM | POA: Diagnosis not present

## 2018-12-14 DIAGNOSIS — N2581 Secondary hyperparathyroidism of renal origin: Secondary | ICD-10-CM | POA: Diagnosis not present

## 2018-12-15 DIAGNOSIS — Z9181 History of falling: Secondary | ICD-10-CM | POA: Diagnosis not present

## 2018-12-15 DIAGNOSIS — M6281 Muscle weakness (generalized): Secondary | ICD-10-CM | POA: Diagnosis not present

## 2018-12-15 DIAGNOSIS — R262 Difficulty in walking, not elsewhere classified: Secondary | ICD-10-CM | POA: Diagnosis not present

## 2018-12-16 DIAGNOSIS — M6281 Muscle weakness (generalized): Secondary | ICD-10-CM | POA: Diagnosis not present

## 2018-12-16 DIAGNOSIS — Z9181 History of falling: Secondary | ICD-10-CM | POA: Diagnosis not present

## 2018-12-16 DIAGNOSIS — N186 End stage renal disease: Secondary | ICD-10-CM | POA: Diagnosis not present

## 2018-12-16 DIAGNOSIS — N2581 Secondary hyperparathyroidism of renal origin: Secondary | ICD-10-CM | POA: Diagnosis not present

## 2018-12-16 DIAGNOSIS — I120 Hypertensive chronic kidney disease with stage 5 chronic kidney disease or end stage renal disease: Secondary | ICD-10-CM | POA: Diagnosis not present

## 2018-12-16 DIAGNOSIS — E119 Type 2 diabetes mellitus without complications: Secondary | ICD-10-CM | POA: Diagnosis not present

## 2018-12-16 DIAGNOSIS — R262 Difficulty in walking, not elsewhere classified: Secondary | ICD-10-CM | POA: Diagnosis not present

## 2018-12-16 DIAGNOSIS — I5040 Unspecified combined systolic (congestive) and diastolic (congestive) heart failure: Secondary | ICD-10-CM | POA: Diagnosis not present

## 2018-12-16 DIAGNOSIS — K219 Gastro-esophageal reflux disease without esophagitis: Secondary | ICD-10-CM | POA: Diagnosis not present

## 2018-12-16 DIAGNOSIS — E1142 Type 2 diabetes mellitus with diabetic polyneuropathy: Secondary | ICD-10-CM | POA: Diagnosis not present

## 2018-12-18 DIAGNOSIS — N186 End stage renal disease: Secondary | ICD-10-CM | POA: Diagnosis not present

## 2018-12-18 DIAGNOSIS — E119 Type 2 diabetes mellitus without complications: Secondary | ICD-10-CM | POA: Diagnosis not present

## 2018-12-18 DIAGNOSIS — M1990 Unspecified osteoarthritis, unspecified site: Secondary | ICD-10-CM | POA: Diagnosis not present

## 2018-12-19 DIAGNOSIS — Z992 Dependence on renal dialysis: Secondary | ICD-10-CM | POA: Diagnosis not present

## 2018-12-19 DIAGNOSIS — R262 Difficulty in walking, not elsewhere classified: Secondary | ICD-10-CM | POA: Diagnosis not present

## 2018-12-19 DIAGNOSIS — D631 Anemia in chronic kidney disease: Secondary | ICD-10-CM | POA: Diagnosis not present

## 2018-12-19 DIAGNOSIS — N186 End stage renal disease: Secondary | ICD-10-CM | POA: Diagnosis not present

## 2018-12-19 DIAGNOSIS — Z9181 History of falling: Secondary | ICD-10-CM | POA: Diagnosis not present

## 2018-12-19 DIAGNOSIS — E1129 Type 2 diabetes mellitus with other diabetic kidney complication: Secondary | ICD-10-CM | POA: Diagnosis not present

## 2018-12-19 DIAGNOSIS — N2581 Secondary hyperparathyroidism of renal origin: Secondary | ICD-10-CM | POA: Diagnosis not present

## 2018-12-19 DIAGNOSIS — D509 Iron deficiency anemia, unspecified: Secondary | ICD-10-CM | POA: Diagnosis not present

## 2018-12-19 DIAGNOSIS — M6281 Muscle weakness (generalized): Secondary | ICD-10-CM | POA: Diagnosis not present

## 2018-12-19 DIAGNOSIS — E119 Type 2 diabetes mellitus without complications: Secondary | ICD-10-CM | POA: Diagnosis not present

## 2018-12-20 DIAGNOSIS — Z9181 History of falling: Secondary | ICD-10-CM | POA: Diagnosis not present

## 2018-12-20 DIAGNOSIS — R262 Difficulty in walking, not elsewhere classified: Secondary | ICD-10-CM | POA: Diagnosis not present

## 2018-12-20 DIAGNOSIS — M6281 Muscle weakness (generalized): Secondary | ICD-10-CM | POA: Diagnosis not present

## 2018-12-21 DIAGNOSIS — D631 Anemia in chronic kidney disease: Secondary | ICD-10-CM | POA: Diagnosis not present

## 2018-12-21 DIAGNOSIS — Z9181 History of falling: Secondary | ICD-10-CM | POA: Diagnosis not present

## 2018-12-21 DIAGNOSIS — N2581 Secondary hyperparathyroidism of renal origin: Secondary | ICD-10-CM | POA: Diagnosis not present

## 2018-12-21 DIAGNOSIS — R262 Difficulty in walking, not elsewhere classified: Secondary | ICD-10-CM | POA: Diagnosis not present

## 2018-12-21 DIAGNOSIS — M6281 Muscle weakness (generalized): Secondary | ICD-10-CM | POA: Diagnosis not present

## 2018-12-21 DIAGNOSIS — D509 Iron deficiency anemia, unspecified: Secondary | ICD-10-CM | POA: Diagnosis not present

## 2018-12-21 DIAGNOSIS — E119 Type 2 diabetes mellitus without complications: Secondary | ICD-10-CM | POA: Diagnosis not present

## 2018-12-21 DIAGNOSIS — N186 End stage renal disease: Secondary | ICD-10-CM | POA: Diagnosis not present

## 2018-12-22 DIAGNOSIS — Z9181 History of falling: Secondary | ICD-10-CM | POA: Diagnosis not present

## 2018-12-22 DIAGNOSIS — M6281 Muscle weakness (generalized): Secondary | ICD-10-CM | POA: Diagnosis not present

## 2018-12-22 DIAGNOSIS — R262 Difficulty in walking, not elsewhere classified: Secondary | ICD-10-CM | POA: Diagnosis not present

## 2018-12-23 DIAGNOSIS — E119 Type 2 diabetes mellitus without complications: Secondary | ICD-10-CM | POA: Diagnosis not present

## 2018-12-23 DIAGNOSIS — N186 End stage renal disease: Secondary | ICD-10-CM | POA: Diagnosis not present

## 2018-12-23 DIAGNOSIS — D631 Anemia in chronic kidney disease: Secondary | ICD-10-CM | POA: Diagnosis not present

## 2018-12-23 DIAGNOSIS — N2581 Secondary hyperparathyroidism of renal origin: Secondary | ICD-10-CM | POA: Diagnosis not present

## 2018-12-23 DIAGNOSIS — D509 Iron deficiency anemia, unspecified: Secondary | ICD-10-CM | POA: Diagnosis not present

## 2018-12-25 DIAGNOSIS — I120 Hypertensive chronic kidney disease with stage 5 chronic kidney disease or end stage renal disease: Secondary | ICD-10-CM | POA: Diagnosis not present

## 2018-12-25 DIAGNOSIS — E1142 Type 2 diabetes mellitus with diabetic polyneuropathy: Secondary | ICD-10-CM | POA: Diagnosis not present

## 2018-12-25 DIAGNOSIS — I25118 Atherosclerotic heart disease of native coronary artery with other forms of angina pectoris: Secondary | ICD-10-CM | POA: Diagnosis not present

## 2018-12-25 DIAGNOSIS — I5040 Unspecified combined systolic (congestive) and diastolic (congestive) heart failure: Secondary | ICD-10-CM | POA: Diagnosis not present

## 2018-12-26 DIAGNOSIS — Z9181 History of falling: Secondary | ICD-10-CM | POA: Diagnosis not present

## 2018-12-26 DIAGNOSIS — D631 Anemia in chronic kidney disease: Secondary | ICD-10-CM | POA: Diagnosis not present

## 2018-12-26 DIAGNOSIS — E119 Type 2 diabetes mellitus without complications: Secondary | ICD-10-CM | POA: Diagnosis not present

## 2018-12-26 DIAGNOSIS — M6281 Muscle weakness (generalized): Secondary | ICD-10-CM | POA: Diagnosis not present

## 2018-12-26 DIAGNOSIS — N2581 Secondary hyperparathyroidism of renal origin: Secondary | ICD-10-CM | POA: Diagnosis not present

## 2018-12-26 DIAGNOSIS — R262 Difficulty in walking, not elsewhere classified: Secondary | ICD-10-CM | POA: Diagnosis not present

## 2018-12-26 DIAGNOSIS — D509 Iron deficiency anemia, unspecified: Secondary | ICD-10-CM | POA: Diagnosis not present

## 2018-12-26 DIAGNOSIS — N186 End stage renal disease: Secondary | ICD-10-CM | POA: Diagnosis not present

## 2018-12-27 DIAGNOSIS — R262 Difficulty in walking, not elsewhere classified: Secondary | ICD-10-CM | POA: Diagnosis not present

## 2018-12-27 DIAGNOSIS — H04123 Dry eye syndrome of bilateral lacrimal glands: Secondary | ICD-10-CM | POA: Diagnosis not present

## 2018-12-27 DIAGNOSIS — E119 Type 2 diabetes mellitus without complications: Secondary | ICD-10-CM | POA: Diagnosis not present

## 2018-12-27 DIAGNOSIS — H43813 Vitreous degeneration, bilateral: Secondary | ICD-10-CM | POA: Diagnosis not present

## 2018-12-27 DIAGNOSIS — H25813 Combined forms of age-related cataract, bilateral: Secondary | ICD-10-CM | POA: Diagnosis not present

## 2018-12-27 DIAGNOSIS — M6281 Muscle weakness (generalized): Secondary | ICD-10-CM | POA: Diagnosis not present

## 2018-12-27 DIAGNOSIS — Z9181 History of falling: Secondary | ICD-10-CM | POA: Diagnosis not present

## 2018-12-28 DIAGNOSIS — D509 Iron deficiency anemia, unspecified: Secondary | ICD-10-CM | POA: Diagnosis not present

## 2018-12-28 DIAGNOSIS — M6281 Muscle weakness (generalized): Secondary | ICD-10-CM | POA: Diagnosis not present

## 2018-12-28 DIAGNOSIS — R262 Difficulty in walking, not elsewhere classified: Secondary | ICD-10-CM | POA: Diagnosis not present

## 2018-12-28 DIAGNOSIS — N2581 Secondary hyperparathyroidism of renal origin: Secondary | ICD-10-CM | POA: Diagnosis not present

## 2018-12-28 DIAGNOSIS — N186 End stage renal disease: Secondary | ICD-10-CM | POA: Diagnosis not present

## 2018-12-28 DIAGNOSIS — D631 Anemia in chronic kidney disease: Secondary | ICD-10-CM | POA: Diagnosis not present

## 2018-12-28 DIAGNOSIS — Z9181 History of falling: Secondary | ICD-10-CM | POA: Diagnosis not present

## 2018-12-28 DIAGNOSIS — E119 Type 2 diabetes mellitus without complications: Secondary | ICD-10-CM | POA: Diagnosis not present

## 2018-12-30 DIAGNOSIS — D631 Anemia in chronic kidney disease: Secondary | ICD-10-CM | POA: Diagnosis not present

## 2018-12-30 DIAGNOSIS — N186 End stage renal disease: Secondary | ICD-10-CM | POA: Diagnosis not present

## 2018-12-30 DIAGNOSIS — D509 Iron deficiency anemia, unspecified: Secondary | ICD-10-CM | POA: Diagnosis not present

## 2018-12-30 DIAGNOSIS — E119 Type 2 diabetes mellitus without complications: Secondary | ICD-10-CM | POA: Diagnosis not present

## 2018-12-30 DIAGNOSIS — N2581 Secondary hyperparathyroidism of renal origin: Secondary | ICD-10-CM | POA: Diagnosis not present

## 2019-01-02 DIAGNOSIS — E119 Type 2 diabetes mellitus without complications: Secondary | ICD-10-CM | POA: Diagnosis not present

## 2019-01-02 DIAGNOSIS — N186 End stage renal disease: Secondary | ICD-10-CM | POA: Diagnosis not present

## 2019-01-02 DIAGNOSIS — D509 Iron deficiency anemia, unspecified: Secondary | ICD-10-CM | POA: Diagnosis not present

## 2019-01-02 DIAGNOSIS — N2581 Secondary hyperparathyroidism of renal origin: Secondary | ICD-10-CM | POA: Diagnosis not present

## 2019-01-02 DIAGNOSIS — D631 Anemia in chronic kidney disease: Secondary | ICD-10-CM | POA: Diagnosis not present

## 2019-01-04 DIAGNOSIS — D509 Iron deficiency anemia, unspecified: Secondary | ICD-10-CM | POA: Diagnosis not present

## 2019-01-04 DIAGNOSIS — N186 End stage renal disease: Secondary | ICD-10-CM | POA: Diagnosis not present

## 2019-01-04 DIAGNOSIS — D631 Anemia in chronic kidney disease: Secondary | ICD-10-CM | POA: Diagnosis not present

## 2019-01-04 DIAGNOSIS — E119 Type 2 diabetes mellitus without complications: Secondary | ICD-10-CM | POA: Diagnosis not present

## 2019-01-04 DIAGNOSIS — N2581 Secondary hyperparathyroidism of renal origin: Secondary | ICD-10-CM | POA: Diagnosis not present

## 2019-01-06 DIAGNOSIS — D631 Anemia in chronic kidney disease: Secondary | ICD-10-CM | POA: Diagnosis not present

## 2019-01-06 DIAGNOSIS — D509 Iron deficiency anemia, unspecified: Secondary | ICD-10-CM | POA: Diagnosis not present

## 2019-01-06 DIAGNOSIS — N2581 Secondary hyperparathyroidism of renal origin: Secondary | ICD-10-CM | POA: Diagnosis not present

## 2019-01-06 DIAGNOSIS — E119 Type 2 diabetes mellitus without complications: Secondary | ICD-10-CM | POA: Diagnosis not present

## 2019-01-06 DIAGNOSIS — N186 End stage renal disease: Secondary | ICD-10-CM | POA: Diagnosis not present

## 2019-01-09 DIAGNOSIS — D509 Iron deficiency anemia, unspecified: Secondary | ICD-10-CM | POA: Diagnosis not present

## 2019-01-09 DIAGNOSIS — N2581 Secondary hyperparathyroidism of renal origin: Secondary | ICD-10-CM | POA: Diagnosis not present

## 2019-01-09 DIAGNOSIS — E119 Type 2 diabetes mellitus without complications: Secondary | ICD-10-CM | POA: Diagnosis not present

## 2019-01-09 DIAGNOSIS — D631 Anemia in chronic kidney disease: Secondary | ICD-10-CM | POA: Diagnosis not present

## 2019-01-09 DIAGNOSIS — N186 End stage renal disease: Secondary | ICD-10-CM | POA: Diagnosis not present

## 2019-01-11 DIAGNOSIS — D631 Anemia in chronic kidney disease: Secondary | ICD-10-CM | POA: Diagnosis not present

## 2019-01-11 DIAGNOSIS — E119 Type 2 diabetes mellitus without complications: Secondary | ICD-10-CM | POA: Diagnosis not present

## 2019-01-11 DIAGNOSIS — N2581 Secondary hyperparathyroidism of renal origin: Secondary | ICD-10-CM | POA: Diagnosis not present

## 2019-01-11 DIAGNOSIS — N186 End stage renal disease: Secondary | ICD-10-CM | POA: Diagnosis not present

## 2019-01-11 DIAGNOSIS — D509 Iron deficiency anemia, unspecified: Secondary | ICD-10-CM | POA: Diagnosis not present

## 2019-01-12 DIAGNOSIS — I120 Hypertensive chronic kidney disease with stage 5 chronic kidney disease or end stage renal disease: Secondary | ICD-10-CM | POA: Diagnosis not present

## 2019-01-12 DIAGNOSIS — Z992 Dependence on renal dialysis: Secondary | ICD-10-CM | POA: Diagnosis not present

## 2019-01-12 DIAGNOSIS — E1142 Type 2 diabetes mellitus with diabetic polyneuropathy: Secondary | ICD-10-CM | POA: Diagnosis not present

## 2019-01-12 DIAGNOSIS — I771 Stricture of artery: Secondary | ICD-10-CM | POA: Diagnosis not present

## 2019-01-12 DIAGNOSIS — Z20828 Contact with and (suspected) exposure to other viral communicable diseases: Secondary | ICD-10-CM | POA: Diagnosis not present

## 2019-01-12 DIAGNOSIS — T82858A Stenosis of vascular prosthetic devices, implants and grafts, initial encounter: Secondary | ICD-10-CM | POA: Diagnosis not present

## 2019-01-12 DIAGNOSIS — I5040 Unspecified combined systolic (congestive) and diastolic (congestive) heart failure: Secondary | ICD-10-CM | POA: Diagnosis not present

## 2019-01-12 DIAGNOSIS — K219 Gastro-esophageal reflux disease without esophagitis: Secondary | ICD-10-CM | POA: Diagnosis not present

## 2019-01-12 DIAGNOSIS — N186 End stage renal disease: Secondary | ICD-10-CM | POA: Diagnosis not present

## 2019-01-13 DIAGNOSIS — N186 End stage renal disease: Secondary | ICD-10-CM | POA: Diagnosis not present

## 2019-01-13 DIAGNOSIS — N2581 Secondary hyperparathyroidism of renal origin: Secondary | ICD-10-CM | POA: Diagnosis not present

## 2019-01-13 DIAGNOSIS — D509 Iron deficiency anemia, unspecified: Secondary | ICD-10-CM | POA: Diagnosis not present

## 2019-01-13 DIAGNOSIS — E119 Type 2 diabetes mellitus without complications: Secondary | ICD-10-CM | POA: Diagnosis not present

## 2019-01-13 DIAGNOSIS — D631 Anemia in chronic kidney disease: Secondary | ICD-10-CM | POA: Diagnosis not present

## 2019-01-14 DIAGNOSIS — R7989 Other specified abnormal findings of blood chemistry: Secondary | ICD-10-CM | POA: Diagnosis not present

## 2019-01-14 DIAGNOSIS — E559 Vitamin D deficiency, unspecified: Secondary | ICD-10-CM | POA: Diagnosis not present

## 2019-01-14 DIAGNOSIS — D649 Anemia, unspecified: Secondary | ICD-10-CM | POA: Diagnosis not present

## 2019-01-14 DIAGNOSIS — N390 Urinary tract infection, site not specified: Secondary | ICD-10-CM | POA: Diagnosis not present

## 2019-01-14 DIAGNOSIS — M1990 Unspecified osteoarthritis, unspecified site: Secondary | ICD-10-CM | POA: Diagnosis not present

## 2019-01-14 DIAGNOSIS — R319 Hematuria, unspecified: Secondary | ICD-10-CM | POA: Diagnosis not present

## 2019-01-14 DIAGNOSIS — R410 Disorientation, unspecified: Secondary | ICD-10-CM | POA: Diagnosis not present

## 2019-01-14 DIAGNOSIS — E119 Type 2 diabetes mellitus without complications: Secondary | ICD-10-CM | POA: Diagnosis not present

## 2019-01-14 DIAGNOSIS — N186 End stage renal disease: Secondary | ICD-10-CM | POA: Diagnosis not present

## 2019-01-16 DIAGNOSIS — E119 Type 2 diabetes mellitus without complications: Secondary | ICD-10-CM | POA: Diagnosis not present

## 2019-01-16 DIAGNOSIS — D509 Iron deficiency anemia, unspecified: Secondary | ICD-10-CM | POA: Diagnosis not present

## 2019-01-16 DIAGNOSIS — D631 Anemia in chronic kidney disease: Secondary | ICD-10-CM | POA: Diagnosis not present

## 2019-01-16 DIAGNOSIS — N2581 Secondary hyperparathyroidism of renal origin: Secondary | ICD-10-CM | POA: Diagnosis not present

## 2019-01-16 DIAGNOSIS — N186 End stage renal disease: Secondary | ICD-10-CM | POA: Diagnosis not present

## 2019-01-18 DIAGNOSIS — Z20828 Contact with and (suspected) exposure to other viral communicable diseases: Secondary | ICD-10-CM | POA: Diagnosis not present

## 2019-01-18 DIAGNOSIS — D631 Anemia in chronic kidney disease: Secondary | ICD-10-CM | POA: Diagnosis not present

## 2019-01-18 DIAGNOSIS — N186 End stage renal disease: Secondary | ICD-10-CM | POA: Diagnosis not present

## 2019-01-18 DIAGNOSIS — E119 Type 2 diabetes mellitus without complications: Secondary | ICD-10-CM | POA: Diagnosis not present

## 2019-01-18 DIAGNOSIS — N2581 Secondary hyperparathyroidism of renal origin: Secondary | ICD-10-CM | POA: Diagnosis not present

## 2019-01-18 DIAGNOSIS — E1129 Type 2 diabetes mellitus with other diabetic kidney complication: Secondary | ICD-10-CM | POA: Diagnosis not present

## 2019-01-18 DIAGNOSIS — D509 Iron deficiency anemia, unspecified: Secondary | ICD-10-CM | POA: Diagnosis not present

## 2019-01-18 DIAGNOSIS — Z992 Dependence on renal dialysis: Secondary | ICD-10-CM | POA: Diagnosis not present

## 2019-01-20 DIAGNOSIS — D631 Anemia in chronic kidney disease: Secondary | ICD-10-CM | POA: Diagnosis not present

## 2019-01-20 DIAGNOSIS — N2581 Secondary hyperparathyroidism of renal origin: Secondary | ICD-10-CM | POA: Diagnosis not present

## 2019-01-20 DIAGNOSIS — D509 Iron deficiency anemia, unspecified: Secondary | ICD-10-CM | POA: Diagnosis not present

## 2019-01-20 DIAGNOSIS — E119 Type 2 diabetes mellitus without complications: Secondary | ICD-10-CM | POA: Diagnosis not present

## 2019-01-20 DIAGNOSIS — N186 End stage renal disease: Secondary | ICD-10-CM | POA: Diagnosis not present

## 2019-01-23 DIAGNOSIS — N2581 Secondary hyperparathyroidism of renal origin: Secondary | ICD-10-CM | POA: Diagnosis not present

## 2019-01-23 DIAGNOSIS — D631 Anemia in chronic kidney disease: Secondary | ICD-10-CM | POA: Diagnosis not present

## 2019-01-23 DIAGNOSIS — E119 Type 2 diabetes mellitus without complications: Secondary | ICD-10-CM | POA: Diagnosis not present

## 2019-01-23 DIAGNOSIS — D509 Iron deficiency anemia, unspecified: Secondary | ICD-10-CM | POA: Diagnosis not present

## 2019-01-23 DIAGNOSIS — N186 End stage renal disease: Secondary | ICD-10-CM | POA: Diagnosis not present

## 2019-01-25 DIAGNOSIS — E119 Type 2 diabetes mellitus without complications: Secondary | ICD-10-CM | POA: Diagnosis not present

## 2019-01-25 DIAGNOSIS — N186 End stage renal disease: Secondary | ICD-10-CM | POA: Diagnosis not present

## 2019-01-25 DIAGNOSIS — N2581 Secondary hyperparathyroidism of renal origin: Secondary | ICD-10-CM | POA: Diagnosis not present

## 2019-01-25 DIAGNOSIS — D631 Anemia in chronic kidney disease: Secondary | ICD-10-CM | POA: Diagnosis not present

## 2019-01-25 DIAGNOSIS — Z20828 Contact with and (suspected) exposure to other viral communicable diseases: Secondary | ICD-10-CM | POA: Diagnosis not present

## 2019-01-25 DIAGNOSIS — D509 Iron deficiency anemia, unspecified: Secondary | ICD-10-CM | POA: Diagnosis not present

## 2019-01-27 DIAGNOSIS — D631 Anemia in chronic kidney disease: Secondary | ICD-10-CM | POA: Diagnosis not present

## 2019-01-27 DIAGNOSIS — N2581 Secondary hyperparathyroidism of renal origin: Secondary | ICD-10-CM | POA: Diagnosis not present

## 2019-01-27 DIAGNOSIS — D509 Iron deficiency anemia, unspecified: Secondary | ICD-10-CM | POA: Diagnosis not present

## 2019-01-27 DIAGNOSIS — E119 Type 2 diabetes mellitus without complications: Secondary | ICD-10-CM | POA: Diagnosis not present

## 2019-01-27 DIAGNOSIS — N186 End stage renal disease: Secondary | ICD-10-CM | POA: Diagnosis not present

## 2019-01-30 DIAGNOSIS — D509 Iron deficiency anemia, unspecified: Secondary | ICD-10-CM | POA: Diagnosis not present

## 2019-01-30 DIAGNOSIS — N2581 Secondary hyperparathyroidism of renal origin: Secondary | ICD-10-CM | POA: Diagnosis not present

## 2019-01-30 DIAGNOSIS — D631 Anemia in chronic kidney disease: Secondary | ICD-10-CM | POA: Diagnosis not present

## 2019-01-30 DIAGNOSIS — E119 Type 2 diabetes mellitus without complications: Secondary | ICD-10-CM | POA: Diagnosis not present

## 2019-01-30 DIAGNOSIS — N186 End stage renal disease: Secondary | ICD-10-CM | POA: Diagnosis not present

## 2019-01-31 DIAGNOSIS — Z20828 Contact with and (suspected) exposure to other viral communicable diseases: Secondary | ICD-10-CM | POA: Diagnosis not present

## 2019-02-01 DIAGNOSIS — N2581 Secondary hyperparathyroidism of renal origin: Secondary | ICD-10-CM | POA: Diagnosis not present

## 2019-02-01 DIAGNOSIS — D631 Anemia in chronic kidney disease: Secondary | ICD-10-CM | POA: Diagnosis not present

## 2019-02-01 DIAGNOSIS — D509 Iron deficiency anemia, unspecified: Secondary | ICD-10-CM | POA: Diagnosis not present

## 2019-02-01 DIAGNOSIS — N186 End stage renal disease: Secondary | ICD-10-CM | POA: Diagnosis not present

## 2019-02-01 DIAGNOSIS — E119 Type 2 diabetes mellitus without complications: Secondary | ICD-10-CM | POA: Diagnosis not present

## 2019-02-03 DIAGNOSIS — N186 End stage renal disease: Secondary | ICD-10-CM | POA: Diagnosis not present

## 2019-02-03 DIAGNOSIS — D509 Iron deficiency anemia, unspecified: Secondary | ICD-10-CM | POA: Diagnosis not present

## 2019-02-03 DIAGNOSIS — E119 Type 2 diabetes mellitus without complications: Secondary | ICD-10-CM | POA: Diagnosis not present

## 2019-02-03 DIAGNOSIS — N2581 Secondary hyperparathyroidism of renal origin: Secondary | ICD-10-CM | POA: Diagnosis not present

## 2019-02-03 DIAGNOSIS — D631 Anemia in chronic kidney disease: Secondary | ICD-10-CM | POA: Diagnosis not present

## 2019-02-05 DIAGNOSIS — I120 Hypertensive chronic kidney disease with stage 5 chronic kidney disease or end stage renal disease: Secondary | ICD-10-CM | POA: Diagnosis not present

## 2019-02-05 DIAGNOSIS — I5040 Unspecified combined systolic (congestive) and diastolic (congestive) heart failure: Secondary | ICD-10-CM | POA: Diagnosis not present

## 2019-02-05 DIAGNOSIS — I25118 Atherosclerotic heart disease of native coronary artery with other forms of angina pectoris: Secondary | ICD-10-CM | POA: Diagnosis not present

## 2019-02-05 DIAGNOSIS — E1142 Type 2 diabetes mellitus with diabetic polyneuropathy: Secondary | ICD-10-CM | POA: Diagnosis not present

## 2019-02-06 DIAGNOSIS — N2581 Secondary hyperparathyroidism of renal origin: Secondary | ICD-10-CM | POA: Diagnosis not present

## 2019-02-06 DIAGNOSIS — D509 Iron deficiency anemia, unspecified: Secondary | ICD-10-CM | POA: Diagnosis not present

## 2019-02-06 DIAGNOSIS — E119 Type 2 diabetes mellitus without complications: Secondary | ICD-10-CM | POA: Diagnosis not present

## 2019-02-06 DIAGNOSIS — D631 Anemia in chronic kidney disease: Secondary | ICD-10-CM | POA: Diagnosis not present

## 2019-02-06 DIAGNOSIS — Z03818 Encounter for observation for suspected exposure to other biological agents ruled out: Secondary | ICD-10-CM | POA: Diagnosis not present

## 2019-02-06 DIAGNOSIS — N186 End stage renal disease: Secondary | ICD-10-CM | POA: Diagnosis not present

## 2019-02-08 DIAGNOSIS — E119 Type 2 diabetes mellitus without complications: Secondary | ICD-10-CM | POA: Diagnosis not present

## 2019-02-08 DIAGNOSIS — N2581 Secondary hyperparathyroidism of renal origin: Secondary | ICD-10-CM | POA: Diagnosis not present

## 2019-02-08 DIAGNOSIS — D631 Anemia in chronic kidney disease: Secondary | ICD-10-CM | POA: Diagnosis not present

## 2019-02-08 DIAGNOSIS — D509 Iron deficiency anemia, unspecified: Secondary | ICD-10-CM | POA: Diagnosis not present

## 2019-02-08 DIAGNOSIS — N186 End stage renal disease: Secondary | ICD-10-CM | POA: Diagnosis not present

## 2019-02-09 DIAGNOSIS — K219 Gastro-esophageal reflux disease without esophagitis: Secondary | ICD-10-CM | POA: Diagnosis not present

## 2019-02-09 DIAGNOSIS — I120 Hypertensive chronic kidney disease with stage 5 chronic kidney disease or end stage renal disease: Secondary | ICD-10-CM | POA: Diagnosis not present

## 2019-02-09 DIAGNOSIS — E1142 Type 2 diabetes mellitus with diabetic polyneuropathy: Secondary | ICD-10-CM | POA: Diagnosis not present

## 2019-02-09 DIAGNOSIS — I5040 Unspecified combined systolic (congestive) and diastolic (congestive) heart failure: Secondary | ICD-10-CM | POA: Diagnosis not present

## 2019-02-10 DIAGNOSIS — N186 End stage renal disease: Secondary | ICD-10-CM | POA: Diagnosis not present

## 2019-02-10 DIAGNOSIS — E119 Type 2 diabetes mellitus without complications: Secondary | ICD-10-CM | POA: Diagnosis not present

## 2019-02-10 DIAGNOSIS — D509 Iron deficiency anemia, unspecified: Secondary | ICD-10-CM | POA: Diagnosis not present

## 2019-02-10 DIAGNOSIS — N2581 Secondary hyperparathyroidism of renal origin: Secondary | ICD-10-CM | POA: Diagnosis not present

## 2019-02-10 DIAGNOSIS — D631 Anemia in chronic kidney disease: Secondary | ICD-10-CM | POA: Diagnosis not present

## 2019-02-13 DIAGNOSIS — D509 Iron deficiency anemia, unspecified: Secondary | ICD-10-CM | POA: Diagnosis not present

## 2019-02-13 DIAGNOSIS — N2581 Secondary hyperparathyroidism of renal origin: Secondary | ICD-10-CM | POA: Diagnosis not present

## 2019-02-13 DIAGNOSIS — D631 Anemia in chronic kidney disease: Secondary | ICD-10-CM | POA: Diagnosis not present

## 2019-02-13 DIAGNOSIS — E119 Type 2 diabetes mellitus without complications: Secondary | ICD-10-CM | POA: Diagnosis not present

## 2019-02-13 DIAGNOSIS — N186 End stage renal disease: Secondary | ICD-10-CM | POA: Diagnosis not present

## 2019-02-15 DIAGNOSIS — D631 Anemia in chronic kidney disease: Secondary | ICD-10-CM | POA: Diagnosis not present

## 2019-02-15 DIAGNOSIS — N186 End stage renal disease: Secondary | ICD-10-CM | POA: Diagnosis not present

## 2019-02-15 DIAGNOSIS — E119 Type 2 diabetes mellitus without complications: Secondary | ICD-10-CM | POA: Diagnosis not present

## 2019-02-15 DIAGNOSIS — D509 Iron deficiency anemia, unspecified: Secondary | ICD-10-CM | POA: Diagnosis not present

## 2019-02-15 DIAGNOSIS — N2581 Secondary hyperparathyroidism of renal origin: Secondary | ICD-10-CM | POA: Diagnosis not present

## 2019-02-15 DIAGNOSIS — M1990 Unspecified osteoarthritis, unspecified site: Secondary | ICD-10-CM | POA: Diagnosis not present

## 2019-02-17 ENCOUNTER — Other Ambulatory Visit: Payer: Self-pay

## 2019-02-17 DIAGNOSIS — N2581 Secondary hyperparathyroidism of renal origin: Secondary | ICD-10-CM | POA: Diagnosis not present

## 2019-02-17 DIAGNOSIS — D631 Anemia in chronic kidney disease: Secondary | ICD-10-CM | POA: Diagnosis not present

## 2019-02-17 DIAGNOSIS — N186 End stage renal disease: Secondary | ICD-10-CM | POA: Diagnosis not present

## 2019-02-17 DIAGNOSIS — D509 Iron deficiency anemia, unspecified: Secondary | ICD-10-CM | POA: Diagnosis not present

## 2019-02-17 DIAGNOSIS — E119 Type 2 diabetes mellitus without complications: Secondary | ICD-10-CM | POA: Diagnosis not present

## 2019-02-18 DIAGNOSIS — E1129 Type 2 diabetes mellitus with other diabetic kidney complication: Secondary | ICD-10-CM | POA: Diagnosis not present

## 2019-02-18 DIAGNOSIS — Z992 Dependence on renal dialysis: Secondary | ICD-10-CM | POA: Diagnosis not present

## 2019-02-18 DIAGNOSIS — N186 End stage renal disease: Secondary | ICD-10-CM | POA: Diagnosis not present

## 2019-02-20 DIAGNOSIS — D509 Iron deficiency anemia, unspecified: Secondary | ICD-10-CM | POA: Diagnosis not present

## 2019-02-20 DIAGNOSIS — E119 Type 2 diabetes mellitus without complications: Secondary | ICD-10-CM | POA: Diagnosis not present

## 2019-02-20 DIAGNOSIS — N2581 Secondary hyperparathyroidism of renal origin: Secondary | ICD-10-CM | POA: Diagnosis not present

## 2019-02-20 DIAGNOSIS — D631 Anemia in chronic kidney disease: Secondary | ICD-10-CM | POA: Diagnosis not present

## 2019-02-20 DIAGNOSIS — Z992 Dependence on renal dialysis: Secondary | ICD-10-CM | POA: Diagnosis not present

## 2019-02-20 DIAGNOSIS — N186 End stage renal disease: Secondary | ICD-10-CM | POA: Diagnosis not present

## 2019-02-22 DIAGNOSIS — N186 End stage renal disease: Secondary | ICD-10-CM | POA: Diagnosis not present

## 2019-02-22 DIAGNOSIS — D509 Iron deficiency anemia, unspecified: Secondary | ICD-10-CM | POA: Diagnosis not present

## 2019-02-22 DIAGNOSIS — N2581 Secondary hyperparathyroidism of renal origin: Secondary | ICD-10-CM | POA: Diagnosis not present

## 2019-02-22 DIAGNOSIS — Z992 Dependence on renal dialysis: Secondary | ICD-10-CM | POA: Diagnosis not present

## 2019-02-22 DIAGNOSIS — E119 Type 2 diabetes mellitus without complications: Secondary | ICD-10-CM | POA: Diagnosis not present

## 2019-02-22 DIAGNOSIS — D631 Anemia in chronic kidney disease: Secondary | ICD-10-CM | POA: Diagnosis not present

## 2019-02-23 DIAGNOSIS — Z9181 History of falling: Secondary | ICD-10-CM | POA: Diagnosis not present

## 2019-02-24 DIAGNOSIS — M2012 Hallux valgus (acquired), left foot: Secondary | ICD-10-CM | POA: Diagnosis not present

## 2019-02-24 DIAGNOSIS — I739 Peripheral vascular disease, unspecified: Secondary | ICD-10-CM | POA: Diagnosis not present

## 2019-02-24 DIAGNOSIS — E119 Type 2 diabetes mellitus without complications: Secondary | ICD-10-CM | POA: Diagnosis not present

## 2019-02-24 DIAGNOSIS — N186 End stage renal disease: Secondary | ICD-10-CM | POA: Diagnosis not present

## 2019-02-24 DIAGNOSIS — D631 Anemia in chronic kidney disease: Secondary | ICD-10-CM | POA: Diagnosis not present

## 2019-02-24 DIAGNOSIS — B351 Tinea unguium: Secondary | ICD-10-CM | POA: Diagnosis not present

## 2019-02-24 DIAGNOSIS — E1140 Type 2 diabetes mellitus with diabetic neuropathy, unspecified: Secondary | ICD-10-CM | POA: Diagnosis not present

## 2019-02-24 DIAGNOSIS — M2011 Hallux valgus (acquired), right foot: Secondary | ICD-10-CM | POA: Diagnosis not present

## 2019-02-24 DIAGNOSIS — N2581 Secondary hyperparathyroidism of renal origin: Secondary | ICD-10-CM | POA: Diagnosis not present

## 2019-02-24 DIAGNOSIS — D509 Iron deficiency anemia, unspecified: Secondary | ICD-10-CM | POA: Diagnosis not present

## 2019-02-24 DIAGNOSIS — Z992 Dependence on renal dialysis: Secondary | ICD-10-CM | POA: Diagnosis not present

## 2019-02-27 DIAGNOSIS — N2581 Secondary hyperparathyroidism of renal origin: Secondary | ICD-10-CM | POA: Diagnosis not present

## 2019-02-27 DIAGNOSIS — Z992 Dependence on renal dialysis: Secondary | ICD-10-CM | POA: Diagnosis not present

## 2019-02-27 DIAGNOSIS — E119 Type 2 diabetes mellitus without complications: Secondary | ICD-10-CM | POA: Diagnosis not present

## 2019-02-27 DIAGNOSIS — N186 End stage renal disease: Secondary | ICD-10-CM | POA: Diagnosis not present

## 2019-02-27 DIAGNOSIS — D509 Iron deficiency anemia, unspecified: Secondary | ICD-10-CM | POA: Diagnosis not present

## 2019-02-27 DIAGNOSIS — D631 Anemia in chronic kidney disease: Secondary | ICD-10-CM | POA: Diagnosis not present

## 2019-03-01 DIAGNOSIS — D509 Iron deficiency anemia, unspecified: Secondary | ICD-10-CM | POA: Diagnosis not present

## 2019-03-01 DIAGNOSIS — D631 Anemia in chronic kidney disease: Secondary | ICD-10-CM | POA: Diagnosis not present

## 2019-03-01 DIAGNOSIS — N186 End stage renal disease: Secondary | ICD-10-CM | POA: Diagnosis not present

## 2019-03-01 DIAGNOSIS — E119 Type 2 diabetes mellitus without complications: Secondary | ICD-10-CM | POA: Diagnosis not present

## 2019-03-01 DIAGNOSIS — N2581 Secondary hyperparathyroidism of renal origin: Secondary | ICD-10-CM | POA: Diagnosis not present

## 2019-03-01 DIAGNOSIS — Z992 Dependence on renal dialysis: Secondary | ICD-10-CM | POA: Diagnosis not present

## 2019-03-03 DIAGNOSIS — N2581 Secondary hyperparathyroidism of renal origin: Secondary | ICD-10-CM | POA: Diagnosis not present

## 2019-03-03 DIAGNOSIS — D631 Anemia in chronic kidney disease: Secondary | ICD-10-CM | POA: Diagnosis not present

## 2019-03-03 DIAGNOSIS — D509 Iron deficiency anemia, unspecified: Secondary | ICD-10-CM | POA: Diagnosis not present

## 2019-03-03 DIAGNOSIS — Z992 Dependence on renal dialysis: Secondary | ICD-10-CM | POA: Diagnosis not present

## 2019-03-03 DIAGNOSIS — N186 End stage renal disease: Secondary | ICD-10-CM | POA: Diagnosis not present

## 2019-03-03 DIAGNOSIS — E119 Type 2 diabetes mellitus without complications: Secondary | ICD-10-CM | POA: Diagnosis not present

## 2019-03-06 DIAGNOSIS — D631 Anemia in chronic kidney disease: Secondary | ICD-10-CM | POA: Diagnosis not present

## 2019-03-06 DIAGNOSIS — N186 End stage renal disease: Secondary | ICD-10-CM | POA: Diagnosis not present

## 2019-03-06 DIAGNOSIS — Z992 Dependence on renal dialysis: Secondary | ICD-10-CM | POA: Diagnosis not present

## 2019-03-06 DIAGNOSIS — D509 Iron deficiency anemia, unspecified: Secondary | ICD-10-CM | POA: Diagnosis not present

## 2019-03-06 DIAGNOSIS — N2581 Secondary hyperparathyroidism of renal origin: Secondary | ICD-10-CM | POA: Diagnosis not present

## 2019-03-06 DIAGNOSIS — E119 Type 2 diabetes mellitus without complications: Secondary | ICD-10-CM | POA: Diagnosis not present

## 2019-03-08 DIAGNOSIS — D631 Anemia in chronic kidney disease: Secondary | ICD-10-CM | POA: Diagnosis not present

## 2019-03-08 DIAGNOSIS — D509 Iron deficiency anemia, unspecified: Secondary | ICD-10-CM | POA: Diagnosis not present

## 2019-03-08 DIAGNOSIS — N186 End stage renal disease: Secondary | ICD-10-CM | POA: Diagnosis not present

## 2019-03-08 DIAGNOSIS — Z992 Dependence on renal dialysis: Secondary | ICD-10-CM | POA: Diagnosis not present

## 2019-03-08 DIAGNOSIS — N2581 Secondary hyperparathyroidism of renal origin: Secondary | ICD-10-CM | POA: Diagnosis not present

## 2019-03-08 DIAGNOSIS — E119 Type 2 diabetes mellitus without complications: Secondary | ICD-10-CM | POA: Diagnosis not present

## 2019-03-09 DIAGNOSIS — E559 Vitamin D deficiency, unspecified: Secondary | ICD-10-CM | POA: Diagnosis not present

## 2019-03-09 DIAGNOSIS — R5381 Other malaise: Secondary | ICD-10-CM | POA: Diagnosis not present

## 2019-03-09 DIAGNOSIS — R0989 Other specified symptoms and signs involving the circulatory and respiratory systems: Secondary | ICD-10-CM | POA: Diagnosis not present

## 2019-03-09 DIAGNOSIS — I5040 Unspecified combined systolic (congestive) and diastolic (congestive) heart failure: Secondary | ICD-10-CM | POA: Diagnosis not present

## 2019-03-09 DIAGNOSIS — D649 Anemia, unspecified: Secondary | ICD-10-CM | POA: Diagnosis not present

## 2019-03-09 DIAGNOSIS — I120 Hypertensive chronic kidney disease with stage 5 chronic kidney disease or end stage renal disease: Secondary | ICD-10-CM | POA: Diagnosis not present

## 2019-03-10 DIAGNOSIS — D631 Anemia in chronic kidney disease: Secondary | ICD-10-CM | POA: Diagnosis not present

## 2019-03-10 DIAGNOSIS — Z992 Dependence on renal dialysis: Secondary | ICD-10-CM | POA: Diagnosis not present

## 2019-03-10 DIAGNOSIS — E119 Type 2 diabetes mellitus without complications: Secondary | ICD-10-CM | POA: Diagnosis not present

## 2019-03-10 DIAGNOSIS — D509 Iron deficiency anemia, unspecified: Secondary | ICD-10-CM | POA: Diagnosis not present

## 2019-03-10 DIAGNOSIS — N2581 Secondary hyperparathyroidism of renal origin: Secondary | ICD-10-CM | POA: Diagnosis not present

## 2019-03-10 DIAGNOSIS — N186 End stage renal disease: Secondary | ICD-10-CM | POA: Diagnosis not present

## 2019-03-11 DIAGNOSIS — R0902 Hypoxemia: Secondary | ICD-10-CM | POA: Diagnosis not present

## 2019-03-11 DIAGNOSIS — N390 Urinary tract infection, site not specified: Secondary | ICD-10-CM | POA: Diagnosis not present

## 2019-03-11 DIAGNOSIS — R0989 Other specified symptoms and signs involving the circulatory and respiratory systems: Secondary | ICD-10-CM | POA: Diagnosis not present

## 2019-03-11 DIAGNOSIS — R1110 Vomiting, unspecified: Secondary | ICD-10-CM | POA: Diagnosis not present

## 2019-03-11 DIAGNOSIS — D649 Anemia, unspecified: Secondary | ICD-10-CM | POA: Diagnosis not present

## 2019-03-13 DIAGNOSIS — D649 Anemia, unspecified: Secondary | ICD-10-CM | POA: Diagnosis not present

## 2019-03-13 DIAGNOSIS — E11649 Type 2 diabetes mellitus with hypoglycemia without coma: Secondary | ICD-10-CM | POA: Diagnosis not present

## 2019-03-13 DIAGNOSIS — R5381 Other malaise: Secondary | ICD-10-CM | POA: Diagnosis not present

## 2019-03-13 DIAGNOSIS — R4182 Altered mental status, unspecified: Secondary | ICD-10-CM | POA: Diagnosis not present

## 2019-03-13 DIAGNOSIS — R6889 Other general symptoms and signs: Secondary | ICD-10-CM | POA: Diagnosis not present

## 2019-03-13 DIAGNOSIS — E162 Hypoglycemia, unspecified: Secondary | ICD-10-CM | POA: Diagnosis not present

## 2019-03-13 DIAGNOSIS — R258 Other abnormal involuntary movements: Secondary | ICD-10-CM | POA: Diagnosis not present

## 2019-03-13 DIAGNOSIS — R0689 Other abnormalities of breathing: Secondary | ICD-10-CM | POA: Diagnosis not present

## 2019-03-13 DIAGNOSIS — G301 Alzheimer's disease with late onset: Secondary | ICD-10-CM | POA: Diagnosis not present

## 2019-03-16 DIAGNOSIS — E162 Hypoglycemia, unspecified: Secondary | ICD-10-CM | POA: Diagnosis not present

## 2019-03-16 DIAGNOSIS — R258 Other abnormal involuntary movements: Secondary | ICD-10-CM | POA: Diagnosis not present

## 2019-03-16 DIAGNOSIS — G301 Alzheimer's disease with late onset: Secondary | ICD-10-CM | POA: Diagnosis not present

## 2019-03-16 DIAGNOSIS — I120 Hypertensive chronic kidney disease with stage 5 chronic kidney disease or end stage renal disease: Secondary | ICD-10-CM | POA: Diagnosis not present

## 2019-03-17 DIAGNOSIS — E119 Type 2 diabetes mellitus without complications: Secondary | ICD-10-CM | POA: Diagnosis not present

## 2019-03-17 DIAGNOSIS — D631 Anemia in chronic kidney disease: Secondary | ICD-10-CM | POA: Diagnosis not present

## 2019-03-17 DIAGNOSIS — Z992 Dependence on renal dialysis: Secondary | ICD-10-CM | POA: Diagnosis not present

## 2019-03-17 DIAGNOSIS — D509 Iron deficiency anemia, unspecified: Secondary | ICD-10-CM | POA: Diagnosis not present

## 2019-03-17 DIAGNOSIS — N186 End stage renal disease: Secondary | ICD-10-CM | POA: Diagnosis not present

## 2019-03-17 DIAGNOSIS — N2581 Secondary hyperparathyroidism of renal origin: Secondary | ICD-10-CM | POA: Diagnosis not present

## 2019-03-19 DIAGNOSIS — M1990 Unspecified osteoarthritis, unspecified site: Secondary | ICD-10-CM | POA: Diagnosis not present

## 2019-03-19 DIAGNOSIS — I10 Essential (primary) hypertension: Secondary | ICD-10-CM | POA: Diagnosis not present

## 2019-03-19 DIAGNOSIS — E559 Vitamin D deficiency, unspecified: Secondary | ICD-10-CM | POA: Diagnosis not present

## 2019-03-19 DIAGNOSIS — E1142 Type 2 diabetes mellitus with diabetic polyneuropathy: Secondary | ICD-10-CM | POA: Diagnosis not present

## 2019-03-20 DIAGNOSIS — D509 Iron deficiency anemia, unspecified: Secondary | ICD-10-CM | POA: Diagnosis not present

## 2019-03-20 DIAGNOSIS — D631 Anemia in chronic kidney disease: Secondary | ICD-10-CM | POA: Diagnosis not present

## 2019-03-20 DIAGNOSIS — E119 Type 2 diabetes mellitus without complications: Secondary | ICD-10-CM | POA: Diagnosis not present

## 2019-03-20 DIAGNOSIS — N2581 Secondary hyperparathyroidism of renal origin: Secondary | ICD-10-CM | POA: Diagnosis not present

## 2019-03-20 DIAGNOSIS — Z992 Dependence on renal dialysis: Secondary | ICD-10-CM | POA: Diagnosis not present

## 2019-03-20 DIAGNOSIS — N186 End stage renal disease: Secondary | ICD-10-CM | POA: Diagnosis not present

## 2019-03-21 DIAGNOSIS — Z992 Dependence on renal dialysis: Secondary | ICD-10-CM | POA: Diagnosis not present

## 2019-03-21 DIAGNOSIS — E1129 Type 2 diabetes mellitus with other diabetic kidney complication: Secondary | ICD-10-CM | POA: Diagnosis not present

## 2019-03-21 DIAGNOSIS — N186 End stage renal disease: Secondary | ICD-10-CM | POA: Diagnosis not present

## 2019-03-22 ENCOUNTER — Inpatient Hospital Stay (HOSPITAL_COMMUNITY)
Admission: EM | Admit: 2019-03-22 | Discharge: 2019-03-25 | DRG: 177 | Disposition: A | Discharge status: Skilled Nursing Facility | Payer: Medicare Other | Source: Skilled Nursing Facility | Attending: Family Medicine | Admitting: Family Medicine

## 2019-03-22 ENCOUNTER — Emergency Department (HOSPITAL_COMMUNITY): Payer: Medicare Other

## 2019-03-22 ENCOUNTER — Encounter (HOSPITAL_COMMUNITY): Payer: Self-pay | Admitting: *Deleted

## 2019-03-22 ENCOUNTER — Other Ambulatory Visit: Payer: Self-pay

## 2019-03-22 DIAGNOSIS — U071 COVID-19: Secondary | ICD-10-CM | POA: Diagnosis not present

## 2019-03-22 DIAGNOSIS — R5381 Other malaise: Secondary | ICD-10-CM | POA: Diagnosis not present

## 2019-03-22 DIAGNOSIS — Z992 Dependence on renal dialysis: Secondary | ICD-10-CM | POA: Diagnosis not present

## 2019-03-22 DIAGNOSIS — I132 Hypertensive heart and chronic kidney disease with heart failure and with stage 5 chronic kidney disease, or end stage renal disease: Secondary | ICD-10-CM | POA: Diagnosis present

## 2019-03-22 DIAGNOSIS — I120 Hypertensive chronic kidney disease with stage 5 chronic kidney disease or end stage renal disease: Secondary | ICD-10-CM | POA: Diagnosis not present

## 2019-03-22 DIAGNOSIS — K644 Residual hemorrhoidal skin tags: Secondary | ICD-10-CM | POA: Diagnosis present

## 2019-03-22 DIAGNOSIS — N189 Chronic kidney disease, unspecified: Secondary | ICD-10-CM | POA: Diagnosis present

## 2019-03-22 DIAGNOSIS — Z833 Family history of diabetes mellitus: Secondary | ICD-10-CM

## 2019-03-22 DIAGNOSIS — N186 End stage renal disease: Secondary | ICD-10-CM | POA: Diagnosis present

## 2019-03-22 DIAGNOSIS — N2581 Secondary hyperparathyroidism of renal origin: Secondary | ICD-10-CM | POA: Diagnosis not present

## 2019-03-22 DIAGNOSIS — I509 Heart failure, unspecified: Secondary | ICD-10-CM | POA: Diagnosis present

## 2019-03-22 DIAGNOSIS — I700 Atherosclerosis of aorta: Secondary | ICD-10-CM | POA: Diagnosis not present

## 2019-03-22 DIAGNOSIS — K219 Gastro-esophageal reflux disease without esophagitis: Secondary | ICD-10-CM | POA: Diagnosis present

## 2019-03-22 DIAGNOSIS — D696 Thrombocytopenia, unspecified: Secondary | ICD-10-CM | POA: Diagnosis present

## 2019-03-22 DIAGNOSIS — Z7982 Long term (current) use of aspirin: Secondary | ICD-10-CM

## 2019-03-22 DIAGNOSIS — Z79899 Other long term (current) drug therapy: Secondary | ICD-10-CM

## 2019-03-22 DIAGNOSIS — R062 Wheezing: Secondary | ICD-10-CM | POA: Diagnosis not present

## 2019-03-22 DIAGNOSIS — D631 Anemia in chronic kidney disease: Secondary | ICD-10-CM | POA: Diagnosis present

## 2019-03-22 DIAGNOSIS — E1122 Type 2 diabetes mellitus with diabetic chronic kidney disease: Secondary | ICD-10-CM | POA: Diagnosis present

## 2019-03-22 DIAGNOSIS — Z87891 Personal history of nicotine dependence: Secondary | ICD-10-CM

## 2019-03-22 DIAGNOSIS — R0602 Shortness of breath: Secondary | ICD-10-CM | POA: Diagnosis not present

## 2019-03-22 DIAGNOSIS — I10 Essential (primary) hypertension: Secondary | ICD-10-CM | POA: Diagnosis present

## 2019-03-22 DIAGNOSIS — I2510 Atherosclerotic heart disease of native coronary artery without angina pectoris: Secondary | ICD-10-CM | POA: Diagnosis present

## 2019-03-22 DIAGNOSIS — F0390 Unspecified dementia without behavioral disturbance: Secondary | ICD-10-CM | POA: Diagnosis present

## 2019-03-22 DIAGNOSIS — J9811 Atelectasis: Secondary | ICD-10-CM | POA: Diagnosis not present

## 2019-03-22 DIAGNOSIS — E119 Type 2 diabetes mellitus without complications: Secondary | ICD-10-CM

## 2019-03-22 DIAGNOSIS — R509 Fever, unspecified: Secondary | ICD-10-CM | POA: Diagnosis not present

## 2019-03-22 DIAGNOSIS — E11649 Type 2 diabetes mellitus with hypoglycemia without coma: Secondary | ICD-10-CM | POA: Diagnosis not present

## 2019-03-22 DIAGNOSIS — Z880 Allergy status to penicillin: Secondary | ICD-10-CM

## 2019-03-22 DIAGNOSIS — D649 Anemia, unspecified: Secondary | ICD-10-CM | POA: Diagnosis not present

## 2019-03-22 DIAGNOSIS — E8889 Other specified metabolic disorders: Secondary | ICD-10-CM | POA: Diagnosis present

## 2019-03-22 DIAGNOSIS — Z85828 Personal history of other malignant neoplasm of skin: Secondary | ICD-10-CM

## 2019-03-22 DIAGNOSIS — Z8249 Family history of ischemic heart disease and other diseases of the circulatory system: Secondary | ICD-10-CM

## 2019-03-22 DIAGNOSIS — R401 Stupor: Secondary | ICD-10-CM | POA: Diagnosis not present

## 2019-03-22 DIAGNOSIS — Z79891 Long term (current) use of opiate analgesic: Secondary | ICD-10-CM

## 2019-03-22 HISTORY — DX: Atherosclerotic heart disease of native coronary artery without angina pectoris: I25.10

## 2019-03-22 HISTORY — DX: Anorexia: R63.0

## 2019-03-22 HISTORY — DX: Unspecified dementia, unspecified severity, without behavioral disturbance, psychotic disturbance, mood disturbance, and anxiety: F03.90

## 2019-03-22 HISTORY — DX: Heart failure, unspecified: I50.9

## 2019-03-22 HISTORY — DX: Dysphagia, unspecified: R13.10

## 2019-03-22 HISTORY — DX: Unspecified osteoarthritis, unspecified site: M19.90

## 2019-03-22 LAB — CBC WITH DIFFERENTIAL/PLATELET
Abs Immature Granulocytes: 0.08 10*3/uL — ABNORMAL HIGH (ref 0.00–0.07)
Basophils Absolute: 0 10*3/uL (ref 0.0–0.1)
Basophils Relative: 1 %
Eosinophils Absolute: 0 10*3/uL (ref 0.0–0.5)
Eosinophils Relative: 1 %
HCT: 26.6 % — ABNORMAL LOW (ref 39.0–52.0)
Hemoglobin: 8.8 g/dL — ABNORMAL LOW (ref 13.0–17.0)
Immature Granulocytes: 1 %
Lymphocytes Relative: 24 %
Lymphs Abs: 1.4 10*3/uL (ref 0.7–4.0)
MCH: 29.6 pg (ref 26.0–34.0)
MCHC: 33.1 g/dL (ref 30.0–36.0)
MCV: 89.6 fL (ref 80.0–100.0)
Monocytes Absolute: 0.8 10*3/uL (ref 0.1–1.0)
Monocytes Relative: 15 %
Neutro Abs: 3.3 10*3/uL (ref 1.7–7.7)
Neutrophils Relative %: 58 %
Platelets: 115 10*3/uL — ABNORMAL LOW (ref 150–400)
RBC: 2.97 MIL/uL — ABNORMAL LOW (ref 4.22–5.81)
RDW: 13.5 % (ref 11.5–15.5)
WBC: 5.7 10*3/uL (ref 4.0–10.5)
nRBC: 0 % (ref 0.0–0.2)

## 2019-03-22 LAB — HEMOGLOBIN A1C
Hgb A1c MFr Bld: 5.9 % — ABNORMAL HIGH (ref 4.8–5.6)
Mean Plasma Glucose: 122.63 mg/dL

## 2019-03-22 LAB — GLUCOSE, CAPILLARY: Glucose-Capillary: 79 mg/dL (ref 70–99)

## 2019-03-22 LAB — LACTATE DEHYDROGENASE: LDH: 167 U/L (ref 98–192)

## 2019-03-22 LAB — COMPREHENSIVE METABOLIC PANEL
ALT: 23 U/L (ref 0–44)
AST: 34 U/L (ref 15–41)
Albumin: 2.7 g/dL — ABNORMAL LOW (ref 3.5–5.0)
Alkaline Phosphatase: 46 U/L (ref 38–126)
Anion gap: 20 — ABNORMAL HIGH (ref 5–15)
BUN: 59 mg/dL — ABNORMAL HIGH (ref 8–23)
CO2: 25 mmol/L (ref 22–32)
Calcium: 9.1 mg/dL (ref 8.9–10.3)
Chloride: 98 mmol/L (ref 98–111)
Creatinine, Ser: 9.83 mg/dL — ABNORMAL HIGH (ref 0.61–1.24)
GFR calc Af Amer: 5 mL/min — ABNORMAL LOW (ref >60)
GFR calc non Af Amer: 5 mL/min — ABNORMAL LOW (ref >60)
Glucose, Bld: 86 mg/dL (ref 70–99)
Potassium: 3.8 mmol/L (ref 3.5–5.1)
Sodium: 143 mmol/L (ref 135–145)
Total Bilirubin: 0.8 mg/dL (ref 0.3–1.2)
Total Protein: 7.2 g/dL (ref 6.5–8.1)

## 2019-03-22 LAB — SARS CORONAVIRUS 2 BY RT PCR (HOSPITAL ORDER, PERFORMED IN ~~LOC~~ HOSPITAL LAB): SARS Coronavirus 2: POSITIVE — AB

## 2019-03-22 LAB — PHOSPHORUS: Phosphorus: 6.6 mg/dL — ABNORMAL HIGH (ref 2.5–4.6)

## 2019-03-22 LAB — MAGNESIUM: Magnesium: 2.6 mg/dL — ABNORMAL HIGH (ref 1.7–2.4)

## 2019-03-22 LAB — CBG MONITORING, ED: Glucose-Capillary: 82 mg/dL (ref 70–99)

## 2019-03-22 LAB — D-DIMER, QUANTITATIVE: D-Dimer, Quant: 3.5 ug/mL-FEU — ABNORMAL HIGH (ref 0.00–0.50)

## 2019-03-22 MED ORDER — PENTAFLUOROPROP-TETRAFLUOROETH EX AERO: 1.0000 "application " | INHALATION_SPRAY | CUTANEOUS | Status: DC | PRN

## 2019-03-22 MED ORDER — CARVEDILOL 6.25 MG PO TABS: 6.2500 mg | ORAL_TABLET | Freq: Every day | ORAL | Status: DC

## 2019-03-22 MED ORDER — CHLORHEXIDINE GLUCONATE CLOTH 2 % EX PADS
6.0000 | MEDICATED_PAD | Freq: Every day | CUTANEOUS | Status: DC
  Administered 2019-03-23 – 2019-03-25 (×3): 6 via TOPICAL

## 2019-03-22 MED ORDER — LIDOCAINE HCL (PF) 1 % IJ SOLN: 5.0000 mL | INTRAMUSCULAR | Status: DC | PRN

## 2019-03-22 MED ORDER — B COMPLEX-C PO TABS
1.0000 | ORAL_TABLET | Freq: Every day | ORAL | Status: DC
  Administered 2019-03-23 – 2019-03-25 (×2): 1 via ORAL
  Filled 2019-03-22 (×3): qty 1

## 2019-03-22 MED ORDER — ACETAMINOPHEN 650 MG RE SUPP: 650.0000 mg | Freq: Four times a day (QID) | RECTAL | Status: DC | PRN

## 2019-03-22 MED ORDER — ONDANSETRON HCL 4 MG/2ML IJ SOLN: 4.0000 mg | Freq: Four times a day (QID) | INTRAMUSCULAR | Status: DC | PRN

## 2019-03-22 MED ORDER — INSULIN ASPART 100 UNIT/ML ~~LOC~~ SOLN: 0.0000 [IU] | Freq: Every day | SUBCUTANEOUS | Status: DC

## 2019-03-22 MED ORDER — LIDOCAINE-PRILOCAINE 2.5-2.5 % EX CREA
1.0000 "application " | TOPICAL_CREAM | CUTANEOUS | Status: DC | PRN
  Administered 2019-03-24: 1 via TOPICAL
  Filled 2019-03-22: qty 5

## 2019-03-22 MED ORDER — HYDROMORPHONE HCL 1 MG/ML IJ SOLN: 1.0000 mg | Freq: Once | INTRAMUSCULAR | Status: DC

## 2019-03-22 MED ORDER — ACETAMINOPHEN 500 MG PO TABS
500.0000 mg | ORAL_TABLET | Freq: Four times a day (QID) | ORAL | Status: DC | PRN
  Administered 2019-03-23: 500 mg via ORAL
  Filled 2019-03-22: qty 1

## 2019-03-22 MED ORDER — ONDANSETRON HCL 4 MG PO TABS: 4.0000 mg | ORAL_TABLET | Freq: Four times a day (QID) | ORAL | Status: DC | PRN

## 2019-03-22 MED ORDER — CALCIUM ACETATE (PHOS BINDER) 667 MG PO CAPS
1334.0000 mg | ORAL_CAPSULE | Freq: Three times a day (TID) | ORAL | Status: DC
  Administered 2019-03-23 – 2019-03-24 (×2): 1334 mg via ORAL
  Filled 2019-03-22 (×5): qty 2

## 2019-03-22 MED ORDER — MIRTAZAPINE 15 MG PO TABS
7.5000 mg | ORAL_TABLET | Freq: Every day | ORAL | Status: DC
  Administered 2019-03-23: 7.5 mg via ORAL
  Filled 2019-03-22: qty 1

## 2019-03-22 MED ORDER — RENA-VITE PO TABS
1.0000 | ORAL_TABLET | Freq: Every day | ORAL | Status: DC
  Administered 2019-03-23: 1 via ORAL
  Filled 2019-03-22: qty 1

## 2019-03-22 MED ORDER — HEPARIN SODIUM (PORCINE) 5000 UNIT/ML IJ SOLN
5000.0000 [IU] | Freq: Two times a day (BID) | INTRAMUSCULAR | Status: DC
  Administered 2019-03-23 – 2019-03-25 (×4): 5000 [IU] via SUBCUTANEOUS
  Filled 2019-03-22 (×6): qty 1

## 2019-03-22 MED ORDER — INSULIN ASPART 100 UNIT/ML ~~LOC~~ SOLN
0.0000 [IU] | Freq: Three times a day (TID) | SUBCUTANEOUS | Status: DC
  Administered 2019-03-25: 2 [IU] via SUBCUTANEOUS

## 2019-03-22 MED ORDER — SODIUM CHLORIDE 0.9 % IV SOLN: 100.0000 mL | INTRAVENOUS | Status: DC | PRN

## 2019-03-22 MED ORDER — VITA-BEE/C PO TABS: 1.0000 | ORAL_TABLET | Freq: Every day | ORAL | Status: DC

## 2019-03-22 NOTE — ED Notes (Signed)
ED TO INPATIENT HANDOFF REPORT  ED Nurse Name and Phone #: [phone] Qiana Landgrebe  S Name/Age/Gender Lucas Calderon 78 y.o. male Room/Bed: 024C/024C  Code Status   Code Status: Full Code  Home/SNF/Other Skilled nursing facility Patient oriented to: self Is this baseline? Yes   Triage Complete: Triage complete  Chief Complaint Lucas Calderon Covid+ no symptoms  Triage Note Pt here from facility via [facility] because he was tested at dialysis on Monday for Covid and his test came back positive today.  Pt was sent from [facility] because "he needs to be dialysed".  Pt has no complaints.   Allergies Allergies  Allergen Reactions  . Penicillins Other (See Comments)    "Allergic," per MAR     Level of Care/Admitting Diagnosis ED Disposition    ED Disposition Condition [place]: [place] [100100]  Level of Care: Med-Surg [16]  I expect the patient will be discharged within 24 hours: No (not a candidate for 5C-Observation unit)  Covid Evaluation: Confirmed COVID Positive  Diagnosis: COVID-19 virus detected [[id number]]  Admitting Physician: Mckinley Jewel [[id number]]  Attending Physician: Mckinley Jewel [phone]  PT Class (Do Not Modify): Observation [104]  PT Acc Code (Do Not Modify): Observation [10022]       B Medical/Surgery History Past Medical History:  Diagnosis Date  . Anemia   . Anorexia   . Arthritis   . Cancer ([place])    skin cancer - face  . CHF (congestive heart failure) ([place])   . Chronic kidney disease    dialysis T-TH-Sa  . Coronary artery disease   . Dementia ([place])   . Diabetes mellitus without complication ([place])    not taking any meds  . Dysphagia   . GERD (gastroesophageal reflux disease)   . Hypertension   . Pneumonia   . Shortness of breath    Past Surgical History:  Procedure Laterality Date  . APPENDECTOMY    . AV FISTULA PLACEMENT Left 09/11/2013   Procedure:  CREATION- RADIOCEPHALIC  arteriovenous fistula;  Surgeon: Conrad [place], MD;  Location: [place];  Service: Vascular;  Laterality: Left;  . FISTULOGRAM Left 01/31/2014   Procedure: FISTULOGRAM;  Surgeon: Conrad Evening Shade, MD;  Location: [place];  Service: Vascular;  Laterality: Left;  . LIGATION OF COMPETING BRANCHES OF ARTERIOVENOUS FISTULA Left 01/31/2014   Procedure: REVISION OF ARTERIOVENOUS FISTULA;  Surgeon: Conrad Van Tassell, MD;  Location: [place];  Service: Vascular;  Laterality: Left;     A IV Location/Drains/Wounds Patient Lines/Drains/Airways Status   Active Line/Drains/Airways    Name:   Placement date:   Placement time:   Site:   Days:   Peripheral IV 03/22/19 Right Forearm   03/22/19    1426    Forearm   less than 1   Fistula / Graft Left Forearm Arteriovenous fistula   09/11/13    0853    Forearm   2018   External Urinary Catheter   08/07/16    2224    -   957   Incision (Closed) 09/11/13 Arm Left   09/11/13    0844     2018   Incision (Closed) 01/31/14 Arm Left   01/31/14    1320     1876          Intake/Output Last 24 hours No intake or output data in the 24 hours ending 03/22/19 1905  Labs/Imaging Results for orders placed or performed during the  hospital encounter of 03/22/19 (from the past 48 hour(s))  CBG monitoring, ED     Status: None   Collection Time: 03/22/19  1:57 PM  Result Value Ref Range   Glucose-Capillary 82 70 - 99 mg/dL  CBC with Differential     Status: Abnormal   Collection Time: 03/22/19  3:30 PM  Result Value Ref Range   WBC 5.7 4.0 - 10.5 K/uL   RBC 2.97 (L) 4.22 - 5.81 MIL/uL   Hemoglobin 8.8 (L) 13.0 - 17.0 g/dL   HCT 26.6 (L) 39.0 - 52.0 %   MCV 89.6 80.0 - 100.0 fL   MCH 29.6 26.0 - 34.0 pg   MCHC 33.1 30.0 - 36.0 g/dL   RDW 13.5 11.5 - 15.5 %   Platelets 115 (L) 150 - 400 K/uL    Comment: REPEATED TO VERIFY PLATELET COUNT CONFIRMED BY SMEAR SPECIMEN CHECKED FOR CLOTS Immature Platelet Fraction may be clinically indicated, consider ordering this additional  test [id number]    nRBC 0.0 0.0 - 0.2 %   Neutrophils Relative % 58 %   Neutro Abs 3.3 1.7 - 7.7 K/uL   Lymphocytes Relative 24 %   Lymphs Abs 1.4 0.7 - 4.0 K/uL   Monocytes Relative 15 %   Monocytes Absolute 0.8 0.1 - 1.0 K/uL   Eosinophils Relative 1 %   Eosinophils Absolute 0.0 0.0 - 0.5 K/uL   Basophils Relative 1 %   Basophils Absolute 0.0 0.0 - 0.1 K/uL   Immature Granulocytes 1 %   Abs Immature Granulocytes 0.08 (H) 0.00 - 0.07 K/uL    Comment: Performed at [facility], [address] [place], [place] [address]  Comprehensive metabolic panel     Status: Abnormal   Collection Time: 03/22/19  3:30 PM  Result Value Ref Range   Sodium 143 135 - 145 mmol/L   Potassium 3.8 3.5 - 5.1 mmol/L   Chloride 98 98 - 111 mmol/L   CO2 25 22 - 32 mmol/L   Glucose, Bld 86 70 - 99 mg/dL   BUN 59 (H) 8 - 23 mg/dL   Creatinine, Ser 9.83 (H) 0.61 - 1.24 mg/dL   Calcium 9.1 8.9 - 10.3 mg/dL   Total Protein 7.2 6.5 - 8.1 g/dL   Albumin 2.7 (L) 3.5 - 5.0 g/dL   AST 34 15 - 41 U/L   ALT 23 0 - 44 U/L   Alkaline Phosphatase 46 38 - 126 U/L   Total Bilirubin 0.8 0.3 - 1.2 mg/dL   GFR calc non Af Amer 5 (L) >60 mL/min   GFR calc Af Amer 5 (L) >60 mL/min   Anion gap 20 (H) 5 - 15    Comment: Performed at [facility], [place] [address], [place], [place] [address]  SARS Coronavirus 2 [facility] order, Performed in [facility] hospital lab) Nasopharyngeal Nasopharyngeal Swab     Status: Abnormal   Collection Time: 03/22/19  4:16 PM   Specimen: Nasopharyngeal Swab  Result Value Ref Range   SARS Coronavirus 2 POSITIVE (A) NEGATIVE    Comment: RESULT CALLED TO, READ BACK BY AND VERIFIED WITH: T Bracy Pepper RN 03/22/19 1734 JDW (NOTE) If result is NEGATIVE SARS-CoV-2 target nucleic acids are NOT DETECTED. The SARS-CoV-2 RNA is generally detectable in upper and lower  respiratory specimens during the acute phase of infection. The lowest  concentration of SARS-CoV-2 viral copies this assay can  detect is 250  copies / mL. A negative result does not preclude  SARS-CoV-2 infection  and should not be used as the sole basis for treatment or other  patient management decisions.  A negative result may occur with  improper specimen collection / handling, submission of specimen other  than nasopharyngeal swab, presence of viral mutation(s) within the  areas targeted by this assay, and inadequate number of viral copies  (<250 copies / mL). A negative result must be combined with clinical  observations, patient history, and epidemiological information. If result is POSITIVE SARS-CoV-2 target nucleic acids are DETECTED. The SAR S-CoV-2 RNA is generally detectable in upper and lower  respiratory specimens during the acute phase of infection.  Positive  results are indicative of active infection with SARS-CoV-2.  Clinical  correlation with patient history and other diagnostic information is  necessary to determine patient infection status.  Positive results do  not rule out bacterial infection or co-infection with other viruses. If result is PRESUMPTIVE POSTIVE SARS-CoV-2 nucleic acids MAY BE PRESENT.   A presumptive positive result was obtained on the submitted specimen  and confirmed on repeat testing.  While 2019 novel coronavirus  (SARS-CoV-2) nucleic acids may be present in the submitted sample  additional confirmatory testing may be necessary for epidemiological  and / or clinical management purposes  to differentiate between  SARS-CoV-2 and other Sarbecovirus currently known to infect humans.  If clinically indicated additional testing with an alternate test  methodology [phone]) is advis ed. The SARS-CoV-2 RNA is generally  detectable in upper and lower respiratory specimens during the acute  phase of infection. The expected result is Negative. Fact Sheet for Patients:  StrictlyIdeas.no Fact Sheet for Healthcare  Providers: BankingDealers.co.za This test is not yet approved or cleared by the [place] FDA and has been authorized for detection and/or diagnosis of SARS-CoV-2 by FDA under an Emergency Use Authorization (EUA).  This EUA will remain in effect (meaning this test can be used) for the duration of the COVID-19 declaration under Section 564(b)(1) of the Act, 21 U.S.C. section 360bbb-3(b)(1), unless the authorization is terminated or revoked sooner. Performed at [facility], [place] [address] [place], [place] [address]   Magnesium     Status: Abnormal   Collection Time: 03/22/19  5:40 PM  Result Value Ref Range   Magnesium 2.6 (H) 1.7 - 2.4 mg/dL    Comment: Performed at [place] [address], [place], [place] [address]  Phosphorus     Status: Abnormal   Collection Time: 03/22/19  5:40 PM  Result Value Ref Range   Phosphorus 6.6 (H) 2.5 - 4.6 mg/dL    Comment: Performed at [place] [address] [place], [place] [address]  Lactate dehydrogenase     Status: None   Collection Time: 03/22/19  5:40 PM  Result Value Ref Range   LDH 167 98 - 192 U/L    Comment: Performed at [facility], [place] [address], [place], [place] [address]  D-dimer, quantitative (not at [facility])     Status: Abnormal   Collection Time: 03/22/19  5:40 PM  Result Value Ref Range   D-Dimer, Quant 3.50 (H) 0.00 - 0.50 ug/mL-FEU    Comment: (NOTE) At the manufacturer cut-off of 0.50 ug/mL FEU, this assay has been documented to exclude PE with a sensitivity and negative predictive value of 97 to 99%.  At this time, this assay has not been approved by the FDA to exclude DVT/VTE. Results should be correlated with clinical presentation. Performed at [facility]  [facility], [place] [address], [place], [place] [address]   Hemoglobin A1c     Status: Abnormal   Collection Time: 03/22/19  5:41 PM  Result Value Ref Range   Hgb A1c MFr Bld 5.9 (H) 4.8 - 5.6 %    Comment:  (NOTE) Pre diabetes:          5.7%-6.4% Diabetes:              >6.4% Glycemic control for   <7.0% adults with diabetes    Mean Plasma Glucose 122.63 mg/dL    Comment: Performed at [place] [address] [place], [place] [address]   Dg Chest Portable 1 View  Result Date: 03/22/2019 CLINICAL DATA:  COVID-19, altered level consciousness, shortness of breath EXAM: PORTABLE CHEST 1 VIEW COMPARISON:  03/13/2019 FINDINGS: The heart size and mediastinal contours are within normal limits. Aortic atherosclerosis. Both lungs are clear. The visualized skeletal structures are unremarkable. IMPRESSION: No acute abnormality of the lungs in AP portable projection. Electronically Signed   By: Eddie Candle M.D.   On: 03/22/2019 16:02    Pending Labs Unresulted Labs (From admission, onward)    Start     Ordered   03/23/19 3235  Basic metabolic panel  Tomorrow morning,   R     03/22/19 1744   03/23/19 0500  CBC  Tomorrow morning,   R     03/22/19 1744   03/23/19 0500  D-dimer, quantitative (not at [facility])  Tomorrow morning,   R     03/22/19 1855          Vitals/Pain Today's Vitals   03/22/19 1402 03/22/19 1700 03/22/19 1830 03/22/19 1900  BP: (!) 154/72 (!) 155/89 (!) 143/64 140/71  Pulse: 80 81  82  Resp: 18 19 (!) 7 14  Temp: 98.8 F (37.1 C)     TempSrc: Oral     SpO2: 100% 100%  100%  Weight: 68.1 kg     Height: 5\' 8"  (1.727 m)     PainSc: 0-No pain       Isolation Precautions Droplet and Contact precautions  Medications Medications  Chlorhexidine Gluconate Cloth 2 % PADS 6 each (has no administration in time range)  acetaminophen (TYLENOL) tablet 500 mg (has no administration in time range)    Or  acetaminophen (TYLENOL) suppository 650 mg (has no administration in time range)  ondansetron (ZOFRAN) tablet 4 mg (has no administration in time range)    Or  ondansetron (ZOFRAN) injection 4 mg (has no administration in time range)  heparin injection 5,000 Units (has no  administration in time range)  insulin aspart (novoLOG) injection 0-15 Units (has no administration in time range)  insulin aspart (novoLOG) injection 0-5 Units (has no administration in time range)    Mobility manual wheelchair     Focused Assessments Pulmonary Assessment Handoff:  Lung sounds:   O2 Device: Room Air        R Recommendations: See Admitting Provider Note  Report given to:   Additional Notes: Pt only oriented to self.  Has been drowsy most of the day.  Tested positive on Monday.  Pt denies any symptoms

## 2019-03-22 NOTE — Consult Note (Addendum)
[name] Renal Consultation Note    Indication for Consultation:  Management of ESRD/hemodialysis; anemia, hypertension/volume and secondary hyperparathyroidism PCP: Dr. Marco Collie  HPI: Lucas Calderon is a 78 y.o. male with ESRD on hemodialysis MWF at [facility]. PMH: DM, HTN, dementia, AOCD, SHPT. He is typically compliant with HD, last treatment 03/20/2019 left 1.6 under OP EDW. HD unit reports recent overall decline in function prior to admission today.   Patient apparently tested positive for COVID 19 at SNF and was transported here for further evaluation. He is being retested for COVID 19 presently while MSW is attempting to arrange transportation to and from [place] to our [place] unit in [place]. This may not happen. If unable to arrange transportation, will have to be placed in SNF in [place] in order to continue hemodialysis.   Past Medical History:  Diagnosis Date  . Anemia   . Anorexia   . Arthritis   . Cancer ([place])    skin cancer - face  . CHF (congestive heart failure) ([place])   . Chronic kidney disease    dialysis T-TH-Sa  . Coronary artery disease   . Dementia ([place])   . Diabetes mellitus without complication ([place])    not taking any meds  . Dysphagia   . GERD (gastroesophageal reflux disease)   . Hypertension   . Pneumonia   . Shortness of breath    Past Surgical History:  Procedure Laterality Date  . APPENDECTOMY    . AV FISTULA PLACEMENT Left 09/11/2013   Procedure:  CREATION- RADIOCEPHALIC arteriovenous fistula;  Surgeon: Conrad Ronneby, MD;  Location: [place] [facility];  Service: Vascular;  Laterality: Left;  . FISTULOGRAM Left 01/31/2014   Procedure: FISTULOGRAM;  Surgeon: Conrad Union, MD;  Location: [facility];  Service: Vascular;  Laterality: Left;  . LIGATION OF COMPETING BRANCHES OF ARTERIOVENOUS FISTULA Left 01/31/2014   Procedure: REVISION OF ARTERIOVENOUS FISTULA;  Surgeon: Conrad [place], MD;  Location: [facility] OR;  Service: Vascular;  Laterality:  Left;   Family History  Problem Relation Age of Onset  . Diabetes Mother   . Heart disease Mother   . Hypertension Mother   . Diabetes Father   . Hypertension Father   . Peripheral vascular disease Father   . Hypertension Sister    Social History:  reports that he quit smoking about 25 years ago. He has never used smokeless tobacco. He reports that he does not drink alcohol or use drugs. Allergies  Allergen Reactions  . Penicillins     Unknown   Prior to Admission medications   Medication Sig Start Date End Date Taking? Authorizing Provider  albuterol (PROVENTIL HFA) 108 (90 BASE) MCG/ACT inhaler Inhale 2 puffs into the lungs every 6 (six) hours as needed for wheezing or shortness of breath.    [provider]  amLODipine (NORVASC) 10 MG tablet Take 10 mg by mouth daily.    [provider]  aspirin 81 MG tablet Take 81 mg by mouth daily.    [provider]  B Complex-C-Folic Acid (VITA-BEE/C) TABS Take 1 tablet by mouth daily.    [provider]  carvedilol (COREG) 12.5 MG tablet Take 12.5 mg by mouth 2 (two) times daily with a meal.    [provider]  hydrALAZINE (APRESOLINE) 50 MG tablet Take 1.5 tablets (75 mg total) by mouth 3 (three) times daily. 11/22/13   Hongalgi, Lenis Dickinson, MD  isosorbide mononitrate (IMDUR) 60 MG 24 hr tablet Take 60 mg  by mouth daily.    [provider]  multivitamin (RENA-VIT) TABS tablet Take 1 tablet by mouth at bedtime. 11/22/13   Hongalgi, Lenis Dickinson, MD  oxyCODONE-acetaminophen (ROXICET) 5-325 MG per tablet Take 1 tablet by mouth every 6 (six) hours as needed for severe pain. 01/31/14   Alvia Grove, PA-C  RENVELA 800 MG tablet Take 2,400 mg by mouth 3 (three) times daily. 07/10/16   [provider]   No current facility-administered medications for this encounter.    Current Outpatient Medications  Medication Sig Dispense Refill  . albuterol (PROVENTIL HFA) 108 (90 BASE) MCG/ACT inhaler  Inhale 2 puffs into the lungs every 6 (six) hours as needed for wheezing or shortness of breath.    Marland Kitchen amLODipine (NORVASC) 10 MG tablet Take 10 mg by mouth daily.    Marland Kitchen aspirin 81 MG tablet Take 81 mg by mouth daily.    . B Complex-C-Folic Acid (VITA-BEE/C) TABS Take 1 tablet by mouth daily.    . carvedilol (COREG) 12.5 MG tablet Take 12.5 mg by mouth 2 (two) times daily with a meal.    . hydrALAZINE (APRESOLINE) 50 MG tablet Take 1.5 tablets (75 mg total) by mouth 3 (three) times daily. 120 tablet 0  . isosorbide mononitrate (IMDUR) 60 MG 24 hr tablet Take 60 mg by mouth daily.    . multivitamin (RENA-VIT) TABS tablet Take 1 tablet by mouth at bedtime. 30 tablet 0  . oxyCODONE-acetaminophen (ROXICET) 5-325 MG per tablet Take 1 tablet by mouth every 6 (six) hours as needed for severe pain. 20 tablet 0  . RENVELA 800 MG tablet Take 2,400 mg by mouth 3 (three) times daily.     Labs: Basic Metabolic Panel: No results for input(s): NA, K, CL, CO2, GLUCOSE, BUN, CREATININE, CALCIUM, PHOS in the last 168 hours.  Invalid input(s): ALB Liver Function Tests: No results for input(s): AST, ALT, ALKPHOS, BILITOT, PROT, ALBUMIN in the last 168 hours. No results for input(s): LIPASE, AMYLASE in the last 168 hours. No results for input(s): AMMONIA in the last 168 hours. CBC: No results for input(s): WBC, NEUTROABS, HGB, HCT, MCV, PLT in the last 168 hours. Cardiac Enzymes: No results for input(s): CKTOTAL, CKMB, CKMBINDEX, TROPONINI in the last 168 hours. CBG: Recent Labs  Lab 03/22/19 1357  GLUCAP 82   Iron Studies: No results for input(s): IRON, TIBC, TRANSFERRIN, FERRITIN in the last 72 hours. Studies/Results: No results found.  ROS: As per HPI otherwise negative.   Physical Exam: Vitals:   03/22/19 1402  BP: (!) 154/72  Pulse: 80  Resp: 18  Temp: 98.8 F (37.1 C)  TempSrc: Oral  SpO2: 100%  Weight: 68.1 kg  Height: 5\' 8"  (1.727 m)     Exam: Pt is lethargic, arouses briefly and  answers simple questions, is Ox 1 NO jvd Chest no rales, rhonchi or wheezing, no ^acc musc use Cor reg no mrg  Abd soft ntnd +Bs no hsm  Ext no LE or UE edema   LUA AVF+bruit  NF, ox 1, gen weak x 4   Dialysis Orders: [facility] MWF 4hrs 180NRe 400/800 66.5 kg 2.0/ 2.25 Ca UFP 2  L AVF -Heparin 2000 units IV TIW -Venofer 100 mg IV X 5 doses (Not started yet) -Mircera 30 mcg IV q 4 weeks (last dose 03/08/2019)  Assessment/Plan: 1.  COVID 19 positive at OP SNF 2.  ESRD -  MWF. Will have HD today on schedule as a separate.  3.  Hypertension/volume  - Has been leaving below EDW. Will need lower EDW on DC.   4.  Anemia  - Labs pending 5.  Metabolic bone disease -  Labs pending 6. Nutrition -NPO at present 7. Dementia - at baseline pt is lethargic and speaks very little, 1-2 words responses are typical for him.  8. Dispo - pt will need admit for finding a new SNF in [place] that can transport him to the [facility] HD unit in [place] now that he is COVID+ (his [facility]F does not provide this transport). Have d/w ED PA.    Rita H. Owens Shark, NP-C 03/22/2019, 3:13 PM  [employer] [phone]  Pt seen, examined and agree w A/P as above.  Kelly Splinter  MD 03/22/2019, 4:36 PM

## 2019-03-22 NOTE — ED Provider Notes (Signed)
Care transferred from previous provider at shift change.  See note for full HPI.    In summation 78 year old male on hemodialysis presents for evaluation with COVID test positive.  He currently resides in [place].  He is unable to get transport to COVID positive dialysis center.  He will need skilled nursing facility placement in [place] as we cannot transport to for sending [place] dialysis center for COVID positive patients.  Follow-up on labs, Chest xray, repeat COVID.  Physical Exam  BP (!) 154/72   Pulse 80   Temp 98.8 F (37.1 C) (Oral)   Resp 18   Ht 5\' 8"  (1.727 m)   Wt 68.1 kg   SpO2 100%   BMI 22.83 kg/m   Physical Exam Vitals signs and nursing note reviewed.  Constitutional:      General: He is not in acute distress.    Appearance: He is well-developed. He is not diaphoretic.  HENT:     Head: Atraumatic.  Eyes:     Pupils: Pupils are equal, round, and reactive to light.  Neck:     Musculoskeletal: Normal range of motion and neck supple.  Cardiovascular:     Rate and Rhythm: Normal rate and regular rhythm.  Pulmonary:     Effort: Pulmonary effort is normal. No respiratory distress.  Abdominal:     General: There is no distension.     Palpations: Abdomen is soft.  Musculoskeletal: Normal range of motion.     Comments: Fistula in LUE with thrill.  Skin:    General: Skin is warm and dry.  Neurological:     Mental Status: He is alert.     Comments: Demented    ED Course/Procedures     Procedures Dg Chest Portable 1 View  Result Date: 03/22/2019 CLINICAL DATA:  COVID-19, altered level consciousness, shortness of breath EXAM: PORTABLE CHEST 1 VIEW COMPARISON:  03/13/2019 FINDINGS: The heart size and mediastinal contours are within normal limits. Aortic atherosclerosis. Both lungs are clear. The visualized skeletal structures are unremarkable. IMPRESSION: No acute abnormality of the lungs in AP portable projection. Electronically Signed   By: Eddie Candle M.D.    On: 03/22/2019 16:02   Labs Reviewed  CBC WITH DIFFERENTIAL/PLATELET - Abnormal; Notable for the following components:      Result Value   RBC 2.97 (*)    Hemoglobin 8.8 (*)    HCT 26.6 (*)    Platelets 115 (*)    Abs Immature Granulocytes 0.08 (*)    All other components within normal limits  COMPREHENSIVE METABOLIC PANEL - Abnormal; Notable for the following components:   BUN 59 (*)    Creatinine, Ser 9.83 (*)    Albumin 2.7 (*)    GFR calc non Af Amer 5 (*)    GFR calc Af Amer 5 (*)    Anion gap 20 (*)    All other components within normal limits  SARS CORONAVIRUS 2 (HOSPITAL ORDER, [facility])  CBG MONITORING, ED   MDM   Plan for labs, xray, repeat COVID.  Consulted with Dr. Jonnie Finner with Nephrology. Request hospitalist to admit for new onset COVID, could take a great deal of time for him to get placement in a COVID facility. Will possibly go to dialysis tonight or tomorrow.  Patient appears overall well. No current complaints. COVID pending. Labs at baseline.  1655: Consulted with Dr. Doristine Bosworth, hospitalist service who agrees to evaluate patient for admission.  Tian Mcmurtrey was evaluated in Emergency  Department on 03/22/2019 for the symptoms described in the history of present illness. He was evaluated in the context of the global COVID-19 pandemic, which necessitated consideration that the patient might be at risk for infection with the SARS-CoV-2 virus that causes COVID-19. Institutional protocols and algorithms that pertain to the evaluation of patients at risk for COVID-19 are in a state of rapid change based on information released by regulatory bodies including the CDC and federal and state organizations. These policies and algorithms were followed during the patient's care in the ED.   Marsella Suman A, PA-C 03/22/19 [place], DO 03/22/19 1658

## 2019-03-22 NOTE — ED Provider Notes (Signed)
[facility] EMERGENCY DEPARTMENT Provider Note   CSN: [id number] Arrival date & time: 03/22/19  1341     History   Chief Complaint Chief Complaint  Patient presents with  . Vascular Access Problem    covid positive   Level 5 caveat due to dementia   HPI Lucas Calderon is a 78 y.o. male with history of ESRD on dialysis Monday Wednesday Friday, diabetes mellitus, dementia, CHF, hypertension presenting sent from rehab facility with need for dialysis.  Per EMS, the patient gets tested at his facility for [place] on a weekly basis and his test today resulted positive.  His dialysis facility will not take him.  The patient himself is asymptomatic and denies any cough, shortness of breath, chest pain, abdominal pain, nausea, or vomiting.  He has had no fevers at the facility.  He is at his mental status baseline, currently oriented to person and place.     The history is provided by the patient, the EMS personnel and medical records.    Past Medical History:  Diagnosis Date  . Anemia   . Anorexia   . Arthritis   . Cancer ([place])    skin cancer - face  . CHF (congestive heart failure) ([place])   . Chronic kidney disease    dialysis T-TH-Sa  . Coronary artery disease   . Dementia ([place])   . Diabetes mellitus without complication ([place])    not taking any meds  . Dysphagia   . GERD (gastroesophageal reflux disease)   . Hypertension   . Pneumonia   . Shortness of breath     Patient Active Problem List   Diagnosis Date Noted  . Near syncope 08/07/2016  . Rhabdomyolysis 08/07/2016  . Hypoglycemia 11/19/2013  . Hypercalcemia 11/17/2013  . End stage renal disease ([place]) 11/03/2013  . Pulmonary edema 09/26/2013  . Acute respiratory failure with hypoxia ([place]) 09/26/2013  . HTN (hypertension) 09/26/2013  . Anemia in CKD (chronic kidney disease) 09/26/2013  . Diabetes mellitus, type 2 ([place]) 09/26/2013    Past Surgical History:  Procedure Laterality Date  . APPENDECTOMY     . AV FISTULA PLACEMENT Left 09/11/2013   Procedure:  CREATION- RADIOCEPHALIC arteriovenous fistula;  Surgeon: Conrad Snowflake, MD;  Location: [facility];  Service: Vascular;  Laterality: Left;  . FISTULOGRAM Left 01/31/2014   Procedure: FISTULOGRAM;  Surgeon: Conrad Goodrich, MD;  Location: [facility];  Service: Vascular;  Laterality: Left;  . LIGATION OF COMPETING BRANCHES OF ARTERIOVENOUS FISTULA Left 01/31/2014   Procedure: REVISION OF ARTERIOVENOUS FISTULA;  Surgeon: Conrad Cloverly, MD;  Location: [facility];  Service: Vascular;  Laterality: Left;        Home Medications    Prior to Admission medications   Medication Sig Start Date End Date Taking? Authorizing Provider  albuterol (PROVENTIL HFA) 108 (90 BASE) MCG/ACT inhaler Inhale 2 puffs into the lungs every 6 (six) hours as needed for wheezing or shortness of breath.    [provider]  amLODipine (NORVASC) 10 MG tablet Take 10 mg by mouth daily.    [provider]  aspirin 81 MG tablet Take 81 mg by mouth daily.    [provider]  B Complex-C-Folic Acid (VITA-BEE/C) TABS Take 1 tablet by mouth daily.    [provider]  carvedilol (COREG) 12.5 MG tablet Take 12.5 mg by mouth 2 (two) times daily with a meal.    [provider]  hydrALAZINE (APRESOLINE) 50 MG tablet Take 1.5 tablets (  75 mg total) by mouth 3 (three) times daily. 11/22/13   Hongalgi, Lenis Dickinson, MD  isosorbide mononitrate (IMDUR) 60 MG 24 hr tablet Take 60 mg by mouth daily.    [provider]  multivitamin (RENA-VIT) TABS tablet Take 1 tablet by mouth at bedtime. 11/22/13   Hongalgi, Lenis Dickinson, MD  oxyCODONE-acetaminophen (ROXICET) 5-325 MG per tablet Take 1 tablet by mouth every 6 (six) hours as needed for severe pain. 01/31/14   Alvia Grove, PA-C  RENVELA 800 MG tablet Take 2,400 mg by mouth 3 (three) times daily. 07/10/16   [provider]    Family History Family History  Problem Relation Age of Onset  . Diabetes Mother   .  Heart disease Mother   . Hypertension Mother   . Diabetes Father   . Hypertension Father   . Peripheral vascular disease Father   . Hypertension Sister     Social History Social History   Tobacco Use  . Smoking status: Former Smoker    Quit date: 01/30/1994    Years since quitting: 25.1  . Smokeless tobacco: Never Used  Substance Use Topics  . Alcohol use: No  . Drug use: No     Allergies   Penicillins   Review of Systems Review of Systems  Unable to perform ROS: Dementia     Physical Exam Updated Vital Signs BP (!) 154/72   Pulse 80   Temp 98.8 F (37.1 C) (Oral)   Resp 18   Ht 5\' 8"  (1.727 m)   Wt 68.1 kg   SpO2 100%   BMI 22.83 kg/m   Physical Exam Vitals signs and nursing note reviewed.  Constitutional:      General: He is not in acute distress.    Appearance: He is well-developed.  HENT:     Head: Normocephalic and atraumatic.  Eyes:     General:        Right eye: No discharge.        Left eye: No discharge.     Conjunctiva/sclera: Conjunctivae normal.  Neck:     Vascular: No JVD.     Trachea: No tracheal deviation.  Cardiovascular:     Rate and Rhythm: Normal rate.     Comments: Dialysis fistula in the left upper extremity with palpable thrill.  No peripheral edema. Pulmonary:     Effort: Pulmonary effort is normal.     Comments: Speaking in full sentences without difficulty, SPO2 saturations 100% on room air. Abdominal:     General: Bowel sounds are normal. There is no distension.     Palpations: Abdomen is soft.     Tenderness: There is no abdominal tenderness. There is no guarding or rebound.  Skin:    General: Skin is warm and dry.     Findings: No erythema.  Neurological:     Mental Status: He is alert.     Comments: No evidence of dysarthria or aphasia.  Oriented to person and place but not time or events.  Follows most commands.  Moves all extremities spontaneously without difficulty.  Psychiatric:        Behavior: Behavior  normal.      ED Treatments / Results  Labs (all labs ordered are listed, but only abnormal results are displayed) Labs Reviewed  CBC WITH DIFFERENTIAL/PLATELET - Abnormal; Notable for the following components:      Result Value   RBC 2.97 (*)    Hemoglobin 8.8 (*)    HCT 26.6 (*)  Abs Immature Granulocytes 0.08 (*)    All other components within normal limits  COMPREHENSIVE METABOLIC PANEL - Abnormal; Notable for the following components:   BUN 59 (*)    Creatinine, Ser 9.83 (*)    Albumin 2.7 (*)    GFR calc non Af Amer 5 (*)    GFR calc Af Amer 5 (*)    Anion gap 20 (*)    All other components within normal limits  SARS CORONAVIRUS 2 (HOSPITAL ORDER, [facility])  CBG MONITORING, ED    EKG None  Radiology Dg Chest Portable 1 View  Result Date: 03/22/2019 CLINICAL DATA:  COVID-19, altered level consciousness, shortness of breath EXAM: PORTABLE CHEST 1 VIEW COMPARISON:  03/13/2019 FINDINGS: The heart size and mediastinal contours are within normal limits. Aortic atherosclerosis. Both lungs are clear. The visualized skeletal structures are unremarkable. IMPRESSION: No acute abnormality of the lungs in AP portable projection. Electronically Signed   By: Eddie Candle M.D.   On: 03/22/2019 16:02    Procedures Procedures (including critical care time)  Medications Ordered in ED Medications  Chlorhexidine Gluconate Cloth 2 % PADS 6 each (has no administration in time range)     Initial Impression / Assessment and Plan / ED Course  I have reviewed the triage vital signs and the nursing notes.  Pertinent labs & imaging results that were available during my care of the patient were reviewed by me and considered in my medical decision making (see chart for details).        Lucas Calderon was evaluated in Emergency Department on 03/22/2019 for the symptoms described in the history of present illness. He was evaluated in the context of the global  COVID-19 pandemic, which necessitated consideration that the patient might be at risk for infection with the SARS-CoV-2 virus that causes COVID-19. Institutional protocols and algorithms that pertain to the evaluation of patients at risk for COVID-19 are in a state of rapid change based on information released by regulatory bodies including the CDC and federal and state organizations. These policies and algorithms were followed during the patient's care in the ED.  Patient sent from assisted living facility, tested positive for COVID-19 infection and his dialysis facility in [facility] will not dialyze him there.  Clinically he is asymptomatic.  He denies any cough, shortness of breath, chest pain, abdominal pain.  He is afebrile, vital signs are stable.  He is nontoxic in appearance.  Does not appear to be in any respiratory distress.   Spoke with Terri Piedra LCSW with the dialysis service who reports that she is aware of the patient and was called by his dialysis facility.  Unfortunately the problem is that although his rehab facility will take him back, his dialysis facility will not dialyze him in [place] and he will need to be dialyzed somewhere in [place].  There is no way to arrange for transportation from [place] to [place] 3 days a week for his dialysis.  He will likely need to be arranged for placement at a local SNF where admitted to the hospital in order for him to receive dialysis.  Spoke with Juanell Fairly, NP with dialysis service who requests repeat COVID test here as well as basic labs.    Chest x-ray shows no acute cardiopulmonary abnormalities.  Lab work reviewed by me shows no leukocytosis, anemia with hemoglobin of 8.8 BUN and creatinine elevated consistent with his history of ESRD but electrolytes are within normal limits.  4:39 PM Signed out care to oncoming provider PA H[place]ly; pending disposition. Case management and social work in the ED have been consulted.     Final Clinical Impressions(s) / ED Diagnoses   Final diagnoses:  COVID-19 virus detected  ESRD (end stage renal disease) [facility])    ED Discharge Orders    None       Renita Papa, PA-C 03/22/19 1640    Gareth Morgan, MD 03/23/19 2307

## 2019-03-22 NOTE — Progress Notes (Signed)
Renal Navigator received report from [employer] of Operations for patient's OP HD clinic/[place] that patient tested positive for COVID 19 at SNF/[facility] in [place] and was sent to the ED.  Due to patient's COVID positive status, he cannot receive OP HD treatment at home clinic of [facility] and is now being transferred to [facility] for OP HD treatment.  Renal Navigator contacted [facility] and [facility] to evaluate whether patient can return to his SNF in [place] and receive treatment at [facility] in [place]. [facility] states that they are able to take patient back, but cannot transport to OP HD in [place]. [facility] also cannot transport back and forth from [place] to [place] 3x per week. Therefore, patient will need placement in [facility] while he is positive for COVID 19 in order for him to be transported to [facility] isolation shift for OP HD.  Renal Navigator has updated Renal NP/R. Owens Shark and EDP/M. Fawze.  Lucas Calderon, [place] Renal Navigator [phone]

## 2019-03-22 NOTE — ED Triage Notes (Signed)
Pt here from facility via [facility] because he was tested at dialysis on Monday for Covid and his test came back positive today.  Pt was sent from [facility] because "he needs to be dialysed".  Pt has no complaints.

## 2019-03-22 NOTE — H&P (Signed)
History and Physical    Lucas Calderon OIZ:[id number] DOB: 10/13/1940 DOA: 03/22/2019  PCP: Maryella Shivers, MD  Consulting physician: Dr. Roney Jaffe (nephrology) Patient coming from:  SNF/[facility] in [place] I have personally briefly reviewed patient's old medical records in [place]  Chief Complaint: ESRD, COVID positive  HPI: Lucas Calderon is a 78 y.o. African-American male with medical history significant of end-stage renal disease on hemodialysis on (MWF) at [facility] kidney center, type 2 diabetes mellitus, hypertension, anemia of chronic disease, chronic thrombocytopenia, dementia.  He tested positive for COVID-19 on Monday (03/20/19) at SNF/[facility] in [place] and was transferred here for HD as he is unable to get transport to COVID positive dialysis center & therefore he will need SNF placement here in [place] because of his COVID positive status and transportation issues. he is compliant with his dialysis appointments.  He is poor historian, has dementia.  History mostly gathered from EMR.   ED Course: His repeat test in ED for COVID 19 came back positive.  Patient lying comfortably on the bed, vitals stable, asymptomatic, denies cough, congestion, fever, chills, shortness of breath, leg swelling, chest pain, palpitation. Nephrology consulted-plan is to HD today.  Review of Systems: As per HPI otherwise negative.    Past Medical History:  Diagnosis Date   Anemia    Anorexia    Arthritis    Cancer ([place])    skin cancer - face   CHF (congestive heart failure) (H[facility])    Chronic kidney disease    dialysis T-TH-Sa   Coronary artery disease    Dementia ([facility])    Diabetes mellitus without complication ([place])    not taking any meds   Dysphagia    GERD (gastroesophageal reflux disease)    Hypertension    Pneumonia    Shortness of breath     Past Surgical History:  Procedure Laterality Date   APPENDECTOMY     AV FISTULA PLACEMENT Left  09/11/2013   Procedure:  CREATION- RADIOCEPHALIC arteriovenous fistula;  Surgeon: Conrad Charles Mix, MD;  Location: [facility];  Service: Vascular;  Laterality: Left;   FISTULOGRAM Left 01/31/2014   Procedure: FISTULOGRAM;  Surgeon: Conrad Fisher, MD;  Location: [facility];  Service: Vascular;  Laterality: Left;   LIGATION OF COMPETING BRANCHES OF ARTERIOVENOUS FISTULA Left 01/31/2014   Procedure: REVISION OF ARTERIOVENOUS FISTULA;  Surgeon: Conrad , MD;  Location: [facility];  Service: Vascular;  Laterality: Left;     reports that he quit smoking about 25 years ago. He has never used smokeless tobacco. He reports that he does not drink alcohol or use drugs.  Allergies  Allergen Reactions   Penicillins Other (See Comments)    "Allergic," per [facility]     Family History  Problem Relation Age of Onset   Diabetes Mother    Heart disease Mother    Hypertension Mother    Diabetes Father    Hypertension Father    Peripheral vascular disease Father    Hypertension Sister     Prior to Admission medications   Medication Sig Start Date End Date Taking? Authorizing Provider  albuterol (PROVENTIL HFA) 108 (90 BASE) MCG/ACT inhaler Inhale 2 puffs into the lungs every 6 (six) hours as needed for wheezing or shortness of breath.    [provider]  amLODipine (NORVASC) 10 MG tablet Take 10 mg by mouth daily.    [provider]  aspirin 81 MG tablet Take 81 mg by mouth daily.  [provider]  B Complex-C-Folic Acid (VITA-BEE/C) TABS Take 1 tablet by mouth daily.    [provider]  carvedilol (COREG) 12.5 MG tablet Take 12.5 mg by mouth 2 (two) times daily with a meal.    [provider]  hydrALAZINE (APRESOLINE) 50 MG tablet Take 1.5 tablets (75 mg total) by mouth 3 (three) times daily. 11/22/13   Hongalgi, Lenis Dickinson, MD  isosorbide mononitrate (IMDUR) 60 MG 24 hr tablet Take 60 mg by mouth daily.    [provider]  multivitamin (RENA-VIT) TABS tablet Take  1 tablet by mouth at bedtime. 11/22/13   Hongalgi, Lenis Dickinson, MD  oxyCODONE-acetaminophen (ROXICET) 5-325 MG per tablet Take 1 tablet by mouth every 6 (six) hours as needed for severe pain. 01/31/14   Alvia Grove, PA-C  RENVELA 800 MG tablet Take 2,400 mg by mouth 3 (three) times daily. 07/10/16   [provider]    Physical Exam: Vitals:   03/22/19 1402 03/22/19 1700  BP: (!) 154/72 (!) 155/89  Pulse: 80 81  Resp: 18 19  Temp: 98.8 F (37.1 C)   TempSrc: Oral   SpO2: 100% 100%  Weight: 68.1 kg   Height: 5\' 8"  (1.727 m)     Constitutional: NAD, calm, comfortable Vitals:   03/22/19 1402 03/22/19 1700  BP: (!) 154/72 (!) 155/89  Pulse: 80 81  Resp: 18 19  Temp: 98.8 F (37.1 C)   TempSrc: Oral   SpO2: 100% 100%  Weight: 68.1 kg   Height: 5\' 8"  (1.727 m)    Eyes: PERRL, lids and conjunctivae normal, cachetic, dry, Not cooperative, Neck: normal, supple, no masses, no thyromegaly Respiratory: clear to auscultation bilaterally, no wheezing, no crackles. Normal respiratory effort. No accessory muscle use.  Cardiovascular: Regular rate and rhythm, no murmurs / rubs / gallops. No extremity edema. 2+ pedal pulses. No carotid bruits.  Abdomen: no tenderness, no masses palpated. No hepatosplenomegaly. Bowel sounds positive.  Musculoskeletal: no clubbing / cyanosis. No joint deformity upper and lower extremities. Good ROM, no contractures. Normal muscle tone.  Skin: no rashes, lesions, ulcers. No induration Neurologic: CN 2-12 grossly intact. Sensation intact, DTR normal. Strength 5/5 in all 4.  Psychiatric: Alert, oriented to place, has dementia  Labs on Admission: I have personally reviewed following labs and imaging studies  CBC: Recent Labs  Lab 03/22/19 1530  WBC 5.7  NEUTROABS 3.3  HGB 8.8*  HCT 26.6*  MCV 89.6  PLT 409*   Basic Metabolic Panel: Recent Labs  Lab 03/22/19 1530 03/22/19 1740  NA 143  --   K 3.8  --   CL 98  --   CO2 25  --   GLUCOSE 86   --   BUN 59*  --   CREATININE 9.83*  --   CALCIUM 9.1  --   MG  --  2.6*  PHOS  --  6.6*   GFR: Estimated Creatinine Clearance: 6.3 mL/min (A) (by C-G formula based on SCr of 9.83 mg/dL (H)). Liver Function Tests: Recent Labs  Lab 03/22/19 1530  AST 34  ALT 23  ALKPHOS 46  BILITOT 0.8  PROT 7.2  ALBUMIN 2.7*   No results for input(s): LIPASE, AMYLASE in the last 168 hours. No results for input(s): AMMONIA in the last 168 hours. Coagulation Profile: No results for input(s): INR, PROTIME in the last 168 hours. Cardiac Enzymes: No results for input(s): CKTOTAL, CKMB, CKMBINDEX, TROPONINI in the last 168 hours. BNP (last 3 results) No  results for input(s): PROBNP in the last 8760 hours. HbA1C: Recent Labs    03/22/19 1741  HGBA1C 5.9*   CBG: Recent Labs  Lab 03/22/19 1357  GLUCAP 82   Lipid Profile: No results for input(s): CHOL, HDL, LDLCALC, TRIG, CHOLHDL, LDLDIRECT in the last 72 hours. Thyroid Function Tests: No results for input(s): TSH, T4TOTAL, FREET4, T3FREE, THYROIDAB in the last 72 hours. Anemia Panel: No results for input(s): VITAMINB12, FOLATE, FERRITIN, TIBC, IRON, RETICCTPCT in the last 72 hours. Urine analysis:    Component Value Date/Time   COLORURINE YELLOW 11/17/2013 1642   APPEARANCEUR CLEAR 11/17/2013 1642   LABSPEC 1.014 11/17/2013 1642   PHURINE 5.5 11/17/2013 1642   GLUCOSEU NEGATIVE 11/17/2013 1642   HGBUR TRACE (A) 11/17/2013 1642   BILIRUBINUR NEGATIVE 11/17/2013 1642   KETONESUR NEGATIVE 11/17/2013 1642   PROTEINUR 100 (A) 11/17/2013 1642   UROBILINOGEN 0.2 11/17/2013 1642   NITRITE NEGATIVE 11/17/2013 1642   LEUKOCYTESUR NEGATIVE 11/17/2013 1642    Radiological Exams on Admission: Dg Chest Portable 1 View  Result Date: 03/22/2019 CLINICAL DATA:  COVID-19, altered level consciousness, shortness of breath EXAM: PORTABLE CHEST 1 VIEW COMPARISON:  03/13/2019 FINDINGS: The heart size and mediastinal contours are within normal  limits. Aortic atherosclerosis. Both lungs are clear. The visualized skeletal structures are unremarkable. IMPRESSION: No acute abnormality of the lungs in AP portable projection. Electronically Signed   By: Eddie Candle M.D.   On: 03/22/2019 16:02    EKG: Independently reviewed, NSR, HR: 79/min, premature complexes noted, no acute ST-T wave changes noted.  Assessment/Plan Principal Problem:   COVID-19 virus detected Active Problems:   HTN (hypertension)   Anemia in CKD (chronic kidney disease)   Diabetes mellitus, type 2 (HCC)   End stage renal disease ([place])  COVID-19 Positive: -Repeat test came back positive -Placed patient under observation.  Patient is asymptomatic, vitals stable -Chest x-ray is negative, order LDH and d-dimer -On isolation precautions.  End-stage renal disease: On hemodialysis (MWF) -Reviewed labs -Nephrology on board-plan is HD him today  Hypertension: Blood pressure is on higher side -Resume his home medications -Monitor blood pressure closely.  Type 2 diabetes mellitus: -We will repeat A1c today and start him on sliding scale insulin -Monitor blood sugar closely.  Anemia of chronic disease: -Likely secondary to end-stage renal disease -Monitor H&H closely, no signs of bleeding  Chronic thrombocytopenia: -Secondary to ESRD -Monitor platelet count closely.   DVT prophylaxis: Heparin 5000 units every 12 hours-due to high risk of VTE secondary to COVID positive status and other comorbidities. code Status: Full code  family Communication: None present at bedside.  Plan of care discussed with patient in length and he verbalized understanding and agreed with it. Disposition Plan: To be determined. Consults called: Dr. Harvin Hazel (nephrology ) admission status: Observation  Mckinley Jewel MD [facility] Pager [phone]  If 7PM-7AM, please contact night-coverage [email or web address] Password [facility]  03/22/2019, 6:42 PM

## 2019-03-23 DIAGNOSIS — Z9181 History of falling: Secondary | ICD-10-CM | POA: Diagnosis not present

## 2019-03-23 DIAGNOSIS — D696 Thrombocytopenia, unspecified: Secondary | ICD-10-CM | POA: Diagnosis present

## 2019-03-23 DIAGNOSIS — E11649 Type 2 diabetes mellitus with hypoglycemia without coma: Secondary | ICD-10-CM | POA: Diagnosis not present

## 2019-03-23 DIAGNOSIS — E8889 Other specified metabolic disorders: Secondary | ICD-10-CM | POA: Diagnosis present

## 2019-03-23 DIAGNOSIS — E1129 Type 2 diabetes mellitus with other diabetic kidney complication: Secondary | ICD-10-CM | POA: Diagnosis not present

## 2019-03-23 DIAGNOSIS — R41841 Cognitive communication deficit: Secondary | ICD-10-CM | POA: Diagnosis not present

## 2019-03-23 DIAGNOSIS — Z7982 Long term (current) use of aspirin: Secondary | ICD-10-CM | POA: Diagnosis not present

## 2019-03-23 DIAGNOSIS — R262 Difficulty in walking, not elsewhere classified: Secondary | ICD-10-CM | POA: Diagnosis not present

## 2019-03-23 DIAGNOSIS — K219 Gastro-esophageal reflux disease without esophagitis: Secondary | ICD-10-CM | POA: Diagnosis present

## 2019-03-23 DIAGNOSIS — R279 Unspecified lack of coordination: Secondary | ICD-10-CM | POA: Diagnosis not present

## 2019-03-23 DIAGNOSIS — Z79899 Other long term (current) drug therapy: Secondary | ICD-10-CM | POA: Diagnosis not present

## 2019-03-23 DIAGNOSIS — Z992 Dependence on renal dialysis: Secondary | ICD-10-CM | POA: Diagnosis not present

## 2019-03-23 DIAGNOSIS — E119 Type 2 diabetes mellitus without complications: Secondary | ICD-10-CM | POA: Diagnosis not present

## 2019-03-23 DIAGNOSIS — I132 Hypertensive heart and chronic kidney disease with heart failure and with stage 5 chronic kidney disease, or end stage renal disease: Secondary | ICD-10-CM | POA: Diagnosis present

## 2019-03-23 DIAGNOSIS — Z880 Allergy status to penicillin: Secondary | ICD-10-CM | POA: Diagnosis not present

## 2019-03-23 DIAGNOSIS — N186 End stage renal disease: Secondary | ICD-10-CM | POA: Diagnosis present

## 2019-03-23 DIAGNOSIS — D631 Anemia in chronic kidney disease: Secondary | ICD-10-CM | POA: Diagnosis present

## 2019-03-23 DIAGNOSIS — I2510 Atherosclerotic heart disease of native coronary artery without angina pectoris: Secondary | ICD-10-CM | POA: Diagnosis present

## 2019-03-23 DIAGNOSIS — Z8249 Family history of ischemic heart disease and other diseases of the circulatory system: Secondary | ICD-10-CM | POA: Diagnosis not present

## 2019-03-23 DIAGNOSIS — M6281 Muscle weakness (generalized): Secondary | ICD-10-CM | POA: Diagnosis not present

## 2019-03-23 DIAGNOSIS — R2681 Unsteadiness on feet: Secondary | ICD-10-CM | POA: Diagnosis not present

## 2019-03-23 DIAGNOSIS — R410 Disorientation, unspecified: Secondary | ICD-10-CM | POA: Diagnosis not present

## 2019-03-23 DIAGNOSIS — J189 Pneumonia, unspecified organism: Secondary | ICD-10-CM | POA: Diagnosis not present

## 2019-03-23 DIAGNOSIS — Z743 Need for continuous supervision: Secondary | ICD-10-CM | POA: Diagnosis not present

## 2019-03-23 DIAGNOSIS — K644 Residual hemorrhoidal skin tags: Secondary | ICD-10-CM | POA: Diagnosis present

## 2019-03-23 DIAGNOSIS — Z85828 Personal history of other malignant neoplasm of skin: Secondary | ICD-10-CM | POA: Diagnosis not present

## 2019-03-23 DIAGNOSIS — R402411 Glasgow coma scale score 13-15, in the field [EMT or ambulance]: Secondary | ICD-10-CM | POA: Diagnosis not present

## 2019-03-23 DIAGNOSIS — Z87891 Personal history of nicotine dependence: Secondary | ICD-10-CM | POA: Diagnosis not present

## 2019-03-23 DIAGNOSIS — I120 Hypertensive chronic kidney disease with stage 5 chronic kidney disease or end stage renal disease: Secondary | ICD-10-CM | POA: Diagnosis not present

## 2019-03-23 DIAGNOSIS — Z833 Family history of diabetes mellitus: Secondary | ICD-10-CM | POA: Diagnosis not present

## 2019-03-23 DIAGNOSIS — F0390 Unspecified dementia without behavioral disturbance: Secondary | ICD-10-CM | POA: Diagnosis present

## 2019-03-23 DIAGNOSIS — J9811 Atelectasis: Secondary | ICD-10-CM | POA: Diagnosis present

## 2019-03-23 DIAGNOSIS — Z79891 Long term (current) use of opiate analgesic: Secondary | ICD-10-CM | POA: Diagnosis not present

## 2019-03-23 DIAGNOSIS — R278 Other lack of coordination: Secondary | ICD-10-CM | POA: Diagnosis not present

## 2019-03-23 DIAGNOSIS — R293 Abnormal posture: Secondary | ICD-10-CM | POA: Diagnosis not present

## 2019-03-23 DIAGNOSIS — R1312 Dysphagia, oropharyngeal phase: Secondary | ICD-10-CM | POA: Diagnosis not present

## 2019-03-23 DIAGNOSIS — N2581 Secondary hyperparathyroidism of renal origin: Secondary | ICD-10-CM | POA: Diagnosis present

## 2019-03-23 DIAGNOSIS — I509 Heart failure, unspecified: Secondary | ICD-10-CM | POA: Diagnosis present

## 2019-03-23 DIAGNOSIS — U071 COVID-19: Secondary | ICD-10-CM | POA: Diagnosis present

## 2019-03-23 DIAGNOSIS — E1122 Type 2 diabetes mellitus with diabetic chronic kidney disease: Secondary | ICD-10-CM | POA: Diagnosis present

## 2019-03-23 LAB — BASIC METABOLIC PANEL
Anion gap: 16 — ABNORMAL HIGH (ref 5–15)
BUN: 28 mg/dL — ABNORMAL HIGH (ref 8–23)
CO2: 24 mmol/L (ref 22–32)
Calcium: 7.9 mg/dL — ABNORMAL LOW (ref 8.9–10.3)
Chloride: 96 mmol/L — ABNORMAL LOW (ref 98–111)
Creatinine, Ser: 5.93 mg/dL — ABNORMAL HIGH (ref 0.61–1.24)
GFR calc Af Amer: 10 mL/min — ABNORMAL LOW (ref >60)
GFR calc non Af Amer: 9 mL/min — ABNORMAL LOW (ref >60)
Glucose, Bld: 68 mg/dL — ABNORMAL LOW (ref 70–99)
Potassium: 3.5 mmol/L (ref 3.5–5.1)
Sodium: 136 mmol/L (ref 135–145)

## 2019-03-23 LAB — CBC
HCT: 29 % — ABNORMAL LOW (ref 39.0–52.0)
HCT: 29.8 % — ABNORMAL LOW (ref 39.0–52.0)
Hemoglobin: 9.5 g/dL — ABNORMAL LOW (ref 13.0–17.0)
Hemoglobin: 10 g/dL — ABNORMAL LOW (ref 13.0–17.0)
MCH: 29.1 pg (ref 26.0–34.0)
MCH: 29.5 pg (ref 26.0–34.0)
MCHC: 32.8 g/dL (ref 30.0–36.0)
MCHC: 33.6 g/dL (ref 30.0–36.0)
MCV: 89 fL (ref 80.0–100.0)
MCV: 87.9 fL (ref 80.0–100.0)
Platelets: 143 10*3/uL — ABNORMAL LOW (ref 150–400)
Platelets: 143 10*3/uL — ABNORMAL LOW (ref 150–400)
RBC: 3.26 MIL/uL — ABNORMAL LOW (ref 4.22–5.81)
RBC: 3.39 MIL/uL — ABNORMAL LOW (ref 4.22–5.81)
RDW: 13.4 % (ref 11.5–15.5)
RDW: 13.4 % (ref 11.5–15.5)
WBC: 6 10*3/uL (ref 4.0–10.5)
WBC: 5.6 10*3/uL (ref 4.0–10.5)
nRBC: 0 % (ref 0.0–0.2)
nRBC: 0 % (ref 0.0–0.2)

## 2019-03-23 LAB — GLUCOSE, CAPILLARY
Glucose-Capillary: 61 mg/dL — ABNORMAL LOW (ref 70–99)
Glucose-Capillary: 62 mg/dL — ABNORMAL LOW (ref 70–99)
Glucose-Capillary: 137 mg/dL — ABNORMAL HIGH (ref 70–99)
Glucose-Capillary: 116 mg/dL — ABNORMAL HIGH (ref 70–99)
Glucose-Capillary: 134 mg/dL — ABNORMAL HIGH (ref 70–99)
Glucose-Capillary: 136 mg/dL — ABNORMAL HIGH (ref 70–99)

## 2019-03-23 LAB — D-DIMER, QUANTITATIVE: D-Dimer, Quant: 2.86 ug/mL-FEU — ABNORMAL HIGH (ref 0.00–0.50)

## 2019-03-23 MED ORDER — CARVEDILOL 6.25 MG PO TABS
6.2500 mg | ORAL_TABLET | ORAL | Status: DC
  Administered 2019-03-23 – 2019-03-25 (×2): 6.25 mg via ORAL
  Filled 2019-03-23 (×2): qty 1

## 2019-03-23 MED ORDER — FAMOTIDINE 20 MG PO TABS
20.0000 mg | ORAL_TABLET | ORAL | Status: DC
  Filled 2019-03-23: qty 1

## 2019-03-23 MED ORDER — DEXTROSE 50 % IV SOLN
INTRAVENOUS | Status: AC
  Administered 2019-03-23: 25 mL
  Filled 2019-03-23: qty 50

## 2019-03-23 MED ORDER — CARVEDILOL 6.25 MG PO TABS
6.2500 mg | ORAL_TABLET | Freq: Every day | ORAL | Status: DC
  Administered 2019-03-23: 6.25 mg via ORAL
  Filled 2019-03-23: qty 1

## 2019-03-23 MED ORDER — CHLORHEXIDINE GLUCONATE CLOTH 2 % EX PADS
6.0000 | MEDICATED_PAD | Freq: Every day | CUTANEOUS | Status: DC
  Administered 2019-03-24 – 2019-03-25 (×2): 6 via TOPICAL

## 2019-03-23 MED ORDER — HYDROCORTISONE ACETATE 25 MG RE SUPP
25.0000 mg | Freq: Two times a day (BID) | RECTAL | Status: DC
  Administered 2019-03-23: 25 mg via RECTAL
  Filled 2019-03-23 (×5): qty 1

## 2019-03-23 NOTE — Progress Notes (Signed)
PROGRESS NOTE  Lucas Calderon SFK:[id number] DOB: 12-08-1940 DOA: 03/22/2019 PCP: Maryella Shivers, MD  Brief History   78 year old man PMH ESRD on hemodialysis MWF in [place] who tested positive for COVID-19 August 31.  His usual dialysis facility will not dialyze him while COVID positive and because of transportation issues he had to be transported here to [facility] for hospitalization until outpatient dialysis and transfers to be arranged.  Fortunately he was asymptomatic from [place] standpoint.  A & P  COVID-19 positive without evidence of pneumonia or systemic signs or symptoms. --Appears asymptomatic.  No hypoxia, dyspnea or tachypnea.  Afebrile.  Chest x-ray no acute disease. --Inflammatory markers equivocal.  Discussed with [place] physicians.  In the absence of signs or symptoms of symptomatic disease, will not follow inflammatory markers.  End-stage renal disease on hemodialysis MWF, with associated anemia of chronic kidney disease, unable to receive dialysis in [place] so admitted here in [place] until outpatient dialysis in the area can be arranged. --Hemodialysis per nephrology  One episode of small to moderate bright red blood with bowel movement. --Appears to have external hemorrhoids on exam.  Subsequent stool appears brown.  Hemoglobin stable. --Anusol  --Monitor for significant bleeding.    Diabetes mellitus type 2 --Mild episodes of hypoglycemia this morning.  On sliding scale insulin only.  Probably secondary to poor oral intake.  Subsequently improved.  Thrombocytopenia, mild, probably chronic.  Seen January 2018. --Follow-up as an outpatient if clinically indicated  Dementia, appears to be at baseline.  Per nephrology team who no him, the patient speaks little, 1-2 word responses are typical in patients at baseline lethargic.  [facility] Kitchen Appears stable for placement skilled nursing facility when bed available.  Resolved Hospital Problem list       DVT prophylaxis:  heparin Code Status: Full Family Communication: Aileen Fass by telephone today (sister), updated on plan, she agrees, anticipate discharge 9/4 Disposition Plan: SNF    Murray Hodgkins, MD  [facility] Direct contact: see [email or web address] (further directions at bottom of note if needed) 7PM-7AM contact night coverage as at bottom of note 03/23/2019, 8:10 AM  LOS: 0 days   Significant Hospital Events   . 9/2 admitted COVID positive, hemodialysis need   Consults:  . Nephrology   Procedures:  .   Significant Diagnostic Tests:  . 9/2 chest x-ray clear   Micro Data:  . SARS-CoV-2 positive   Antimicrobials:  .   Interval History/Subjective  Discussed with nursing, the patient is terse, no complaints.  Nursing did note small amount of blood with bowel movement.  Patient appears, comfortable, does not appear to want to answer questions and does not really participate in exam.  Objective   Vitals:  Vitals:   03/23/19 0134 03/23/19 0803  BP: (!) 112/55 128/66  Pulse: 81 78  Resp: 18 16  Temp: 98.9 F (37.2 C) 99.8 F (37.7 C)  SpO2: 100% 100%    Exam:  Constitutional:  . Appears calm and comfortable Respiratory:  . CTA bilaterally, no w/r/r.  . Respiratory effort normal.  Cardiovascular:  . RRR, no m/r/g . No LE extremity edema   Abdomen:  . Soft, nontender, nondistended . Patient consented to external rectal exam, small external hemorrhoids noted.  Stool appears brown.  Exam was visual only. Psychiatric:  . Mental status . Appears confused, difficult to assess mood or affect.  I have personally reviewed the following:   Today's Data  . Potassium 3.5, remainder BMP consistent with end-stage renal disease.  Anion gap  16.  Glucose 68. . WBC stable at 5.6, platelets stable at 143, hemoglobin stable at 10.0.  Scheduled Meds: . B-complex with vitamin C  1 tablet Oral Daily  . calcium acetate  1,334 mg Oral TID WC  . carvedilol  6.25 mg Oral Daily  .  Chlorhexidine Gluconate Cloth  6 each Topical Q0600  . heparin  5,000 Units Subcutaneous Q12H  . insulin aspart  0-15 Units Subcutaneous TID WC  . insulin aspart  0-5 Units Subcutaneous QHS  . mirtazapine  7.5 mg Oral QHS  . multivitamin  1 tablet Oral QHS   Continuous Infusions: . sodium chloride    . sodium chloride      Principal Problem:   COVID-19 virus detected Active Problems:   HTN (hypertension)   Anemia in CKD (chronic kidney disease)   Diabetes mellitus, type 2 ([place])   End stage renal disease ([place])   LOS: 0 days   How to contact the [facility] Attending or Consulting provider 7A - 7P or covering provider during after hours [place], for this patient?  1. Check the care team in [facility] and look for a) attending/consulting TRH provider listed and b) the [facility] team listed 2. Log into [email or web address] and use [place]'s universal password to access. If you do not have the password, please contact the hospital operator. 3. Locate the [facility] provider you are looking for under [facility] and page to a number that you can be directly reached. 4. If you still have difficulty reaching the provider, please page the [facility] (Director on Call) for the Hospitalists listed on amion for assistance.

## 2019-03-23 NOTE — NC FL2 (Signed)
[place] LEVEL OF CARE SCREENING TOOL     IDENTIFICATION  Patient Name: Lucas Calderon Birthdate: [birth year] Sex: male Admission Date (Current Location): 03/22/2019  [facility] and [place] Number:  [employer] and Address:  [facility], [place] [address] [place], [place] [address]      Provider Number: [id number]  Attending Physician Name and Address:  Samuella Cota, MD  Relative Name and Phone Number:       Current Level of Care: Hospital Recommended Level of Care: [place] Prior Approval Number:    Date Approved/Denied:   PASRR Number: [id number]  Discharge Plan: SNF    Current Diagnoses: Patient Active Problem List   Diagnosis Date Noted  . COVID-19 virus detected 03/22/2019  . End stage renal disease ([place]) 11/03/2013  . HTN (hypertension) 09/26/2013  . Anemia in CKD (chronic kidney disease) 09/26/2013  . Diabetes mellitus, type 2 ([place]) 09/26/2013    Orientation RESPIRATION BLADDER Height & Weight     Self  Normal Incontinent Weight: 138 lb 0.1 oz (62.6 kg) Height:  5\' 8"  (172.7 cm)  BEHAVIORAL SYMPTOMS/MOOD NEUROLOGICAL BOWEL NUTRITION STATUS      Incontinent Diet(renal with fluid restriction to 1200 mL)  AMBULATORY STATUS COMMUNICATION OF NEEDS Skin   Extensive Assist Verbally Normal                       Personal Care Assistance Level of Assistance  Bathing, Feeding, Dressing Bathing Assistance: Maximum assistance Feeding assistance: Maximum assistance Dressing Assistance: Maximum assistance     Functional Limitations Info  Sight, Hearing, Speech Sight Info: Adequate Hearing Info: Adequate Speech Info: Impaired(dysarthria)    SPECIAL CARE FACTORS FREQUENCY                       Contractures Contractures Info: Not present    Additional Factors Info  Code Status, Allergies, Insulin Sliding Scale, Psychotropic, Isolation Precautions Code Status Info: full Allergies  Info: Penicillins Psychotropic Info: Remeron 7.5mg  daily at bed Insulin Sliding Scale Info: 0-15 units 3x/day with meals; 0-5 units daily at bed Isolation Precautions Info: Airborne/contact precautions, COVID+     Current Medications (03/23/2019):  This is the current hospital active medication list Current Facility-Administered Medications  Medication Dose Route Frequency Provider Last Rate Last Dose  . 0.9 %  sodium chloride infusion  100 mL Intravenous PRN Valentina Gu, NP      . 0.9 %  sodium chloride infusion  100 mL Intravenous PRN Valentina Gu, NP      . acetaminophen (TYLENOL) tablet 500 mg  500 mg Oral Q6H PRN Pahwani, Rinka R, MD       Or  . acetaminophen (TYLENOL) suppository 650 mg  650 mg Rectal Q6H PRN Pahwani, Rinka R, MD      . B-complex with vitamin C tablet 1 tablet  1 tablet Oral Daily Pat Patrick, RPH   1 tablet at 03/23/19 0942  . calcium acetate (PHOSLO) capsule 1,334 mg  1,334 mg Oral TID WC Pahwani, Rinka R, MD   1,334 mg at 03/23/19 0943  . carvedilol (COREG) tablet 6.25 mg  6.25 mg Oral QHS Samuella Cota, MD      . carvedilol (COREG) tablet 6.25 mg  6.25 mg Oral Once per day on Sun Tue Thu Sat Samuella Cota, MD   6.25 mg at 03/23/19 2703  . Chlorhexidine Gluconate Cloth 2 % PADS  6 each  6 each Topical Q0600 Valentina Gu, NP   6 each at 03/23/19 [id number]  . Chlorhexidine Gluconate Cloth 2 % PADS 6 each  6 each Topical Q0600 Roney Jaffe, MD      . heparin injection 5,000 Units  5,000 Units Subcutaneous Q12H Pahwani, Rinka R, MD   5,000 Units at 03/23/19 0942  . insulin aspart (novoLOG) injection 0-15 Units  0-15 Units Subcutaneous TID WC Pahwani, Rinka R, MD      . insulin aspart (novoLOG) injection 0-5 Units  0-5 Units Subcutaneous QHS Pahwani, Rinka R, MD      . lidocaine (PF) (XYLOCAINE) 1 % injection 5 mL  5 mL Intradermal PRN Valentina Gu, NP      . lidocaine-prilocaine (EMLA) cream 1 application  1 application Topical  PRN Valentina Gu, NP      . mirtazapine (REMERON) tablet 7.5 mg  7.5 mg Oral QHS Pahwani, Rinka R, MD      . multivitamin (RENA-VIT) tablet 1 tablet  1 tablet Oral QHS Pahwani, Rinka R, MD      . ondansetron (ZOFRAN) tablet 4 mg  4 mg Oral Q6H PRN Pahwani, Rinka R, MD       Or  . ondansetron (ZOFRAN) injection 4 mg  4 mg Intravenous Q6H PRN Pahwani, Rinka R, MD      . pentafluoroprop-tetrafluoroeth (GEBAUERS) aerosol 1 application  1 application Topical PRN Valentina Gu, NP         Discharge Medications: Please see discharge summary for a list of discharge medications.  Relevant Imaging Results:  Relevant Lab Results:   Additional Information SS#: [id number]  Geralynn Ochs, LCSW

## 2019-03-23 NOTE — Progress Notes (Signed)
MD notified of low blood sugar 62. Pt asymptomatic and no complaints. Given 1/2 amp D50 and blood sugar came up to 137.

## 2019-03-23 NOTE — Progress Notes (Signed)
MD notified of low grade fever 100.4. Tylenol administered.

## 2019-03-23 NOTE — Progress Notes (Signed)
Pt had incontinent yellowish brown loose bowel movement with small to moderate bright red blood soaked into pt's bed pads. MD notified.

## 2019-03-23 NOTE — Progress Notes (Signed)
[facility] Progress Note  Subjective:  Patient not examined directly given COVID-19 + status, utilizing exam of the primary team and observations of RN's.    Vitals:   03/23/19 0048 03/23/19 0134 03/23/19 0500 03/23/19 0803  BP: (!) 101/51 (!) 112/55  128/66  Pulse: 79 81  78  Resp: 16 18  16   Temp:  98.9 F (37.2 C)  99.8 F (37.7 C)  TempSrc:  Oral  Oral  SpO2: 98% 100%  100%  Weight: 62.5 kg  62.6 kg   Height:        Inpatient medications: . B-complex with vitamin C  1 tablet Oral Daily  . calcium acetate  1,334 mg Oral TID WC  . carvedilol  6.25 mg Oral QHS  . carvedilol  6.25 mg Oral Once per day on Sun Tue Thu Sat  . Chlorhexidine Gluconate Cloth  6 each Topical Q0600  . heparin  5,000 Units Subcutaneous Q12H  . insulin aspart  0-15 Units Subcutaneous TID WC  . insulin aspart  0-5 Units Subcutaneous QHS  . mirtazapine  7.5 mg Oral QHS  . multivitamin  1 tablet Oral QHS   . sodium chloride    . sodium chloride     sodium chloride, sodium chloride, acetaminophen **OR** acetaminophen, lidocaine (PF), lidocaine-prilocaine, ondansetron **OR** ondansetron (ZOFRAN) IV, pentafluoroprop-tetrafluoroeth    Exam:  Patient not examined directly given COVID-19 + status, utilizing exam of the primary team and observations of RN's.    Dialysis: [facility] MWF  4h  400/800   66.5kg  2/2.25 bath  P2  L AVF  Hep 2000 -Venofer 100 mg IV X 5 doses (Not started yet) -Mircera 30 mcg IV q 4 weeks (last dose 03/08/2019)  Assessment/Plan: 1.  COVID 19 positive at OP SNF 2.  ESRD -  MWF. Next HD Friday 3.  Hypertension/volume  - BP's low normal. 4kg down today, euvolemic on exam. CXR neg. Keep even on HD tomorrow.  4.  Anemia  - Hb 9.5, follow, esa when indicated 5.  Metabolic bone disease -  cont meds 6. Dementia - at baseline pt is lethargic and speaks very little, 1-2 words responses are typical for him.  7. Dispo - pt will need admit for finding a SNF in [place]  that can transport him to the [facility] HD unit in [place] now that he is COVID+ Therapist, art SNF does not provide transport to [place] HD units)   [facility] 03/23/2019, 11:01 AM  Iron/TIBC/Ferritin/ %Sat    Component Value Date/Time   IRON 66 11/19/2013 1115   TIBC 208 (L) 11/19/2013 1115   FERRITIN 425 (H) 11/21/2013 0626   IRONPCTSAT 32 11/19/2013 1115   Recent Labs  Lab 03/22/19 1530 03/22/19 1740 03/23/19 0651  NA 143  --  136  K 3.8  --  3.5  CL 98  --  96*  CO2 25  --  24  GLUCOSE 86  --  68*  BUN 59*  --  28*  CREATININE 9.83*  --  5.93*  CALCIUM 9.1  --  7.9*  PHOS  --  6.6*  --   ALBUMIN 2.7*  --   --    Recent Labs  Lab 03/22/19 1530  AST 34  ALT 23  ALKPHOS 46  BILITOT 0.8  PROT 7.2   Recent Labs  Lab 03/23/19 0651  WBC 6.0  HGB 9.5*  HCT 29.0*  PLT 143*

## 2019-03-24 LAB — CBC
HCT: 29.1 % — ABNORMAL LOW (ref 39.0–52.0)
Hemoglobin: 9.7 g/dL — ABNORMAL LOW (ref 13.0–17.0)
MCH: 29.8 pg (ref 26.0–34.0)
MCHC: 33.3 g/dL (ref 30.0–36.0)
MCV: 89.5 fL (ref 80.0–100.0)
Platelets: 144 10*3/uL — ABNORMAL LOW (ref 150–400)
RBC: 3.25 MIL/uL — ABNORMAL LOW (ref 4.22–5.81)
RDW: 13.5 % (ref 11.5–15.5)
WBC: 5.6 10*3/uL (ref 4.0–10.5)
nRBC: 0 % (ref 0.0–0.2)

## 2019-03-24 LAB — BASIC METABOLIC PANEL
Anion gap: 16 — ABNORMAL HIGH (ref 5–15)
BUN: 46 mg/dL — ABNORMAL HIGH (ref 8–23)
CO2: 25 mmol/L (ref 22–32)
Calcium: 8.4 mg/dL — ABNORMAL LOW (ref 8.9–10.3)
Chloride: 98 mmol/L (ref 98–111)
Creatinine, Ser: 8.48 mg/dL — ABNORMAL HIGH (ref 0.61–1.24)
GFR calc Af Amer: 6 mL/min — ABNORMAL LOW (ref >60)
GFR calc non Af Amer: 6 mL/min — ABNORMAL LOW (ref >60)
Glucose, Bld: 115 mg/dL — ABNORMAL HIGH (ref 70–99)
Potassium: 3.6 mmol/L (ref 3.5–5.1)
Sodium: 139 mmol/L (ref 135–145)

## 2019-03-24 LAB — GLUCOSE, CAPILLARY
Glucose-Capillary: 97 mg/dL (ref 70–99)
Glucose-Capillary: 101 mg/dL — ABNORMAL HIGH (ref 70–99)
Glucose-Capillary: 82 mg/dL (ref 70–99)
Glucose-Capillary: 122 mg/dL — ABNORMAL HIGH (ref 70–99)

## 2019-03-24 MED ORDER — HYDROCORTISONE ACETATE 25 MG RE SUPP: 25.0000 mg | Freq: Two times a day (BID) | RECTAL | Status: AC | PRN

## 2019-03-24 MED ORDER — HEPARIN SODIUM (PORCINE) 1000 UNIT/ML IJ SOLN
INTRAMUSCULAR | Status: AC
  Filled 2019-03-24: qty 2

## 2019-03-24 MED ORDER — ZINC SULFATE 220 (50 ZN) MG PO TABS: 1.0000 | ORAL_TABLET | Freq: Every day | ORAL | Status: AC

## 2019-03-24 MED ORDER — HEPARIN SODIUM (PORCINE) 1000 UNIT/ML DIALYSIS
2000.0000 [IU] | Freq: Once | INTRAMUSCULAR | Status: AC
  Administered 2019-03-25: 2000 [IU] via INTRAVENOUS_CENTRAL
  Filled 2019-03-24: qty 2

## 2019-03-24 NOTE — TOC Progression Note (Signed)
Transition of Care [facility]) - Progression Note    Patient Details  Name: Lucas Calderon MRN: [id number] Date of Birth: 1941-03-27  Transition of Care [facility]) CM/SW [place], [place] Phone Number: 03/24/2019, 7:37 PM  Clinical Narrative:   CSW following for discharge plan. CSW notified [facility] of patient's discharge, and [facility] called back that they did not realize that they had to transport patient to dialysis this evening. Admissions at [facility] had written down "third shift, 5:45" and thought that meant "AM" and that he would have already received dialysis in the morning. [facility] unable to provide transport this evening, and unable to take patient after his dialysis session ends at 10:00 PM. Patient will have to admit tomorrow to [facility]. CSW updated Renal Navigator, and patient will receive HD tonight to admit tomorrow morning. CSW updated MD and RN.    Expected Discharge Plan: [place] Barriers to Discharge: Continued Medical Work up, Transportation  Expected Discharge Plan and Services Expected Discharge Plan: [place] Choice: [place] arrangements for the past 2 months: [place] Expected Discharge Date: 03/24/19                                     Social Determinants of Health (SDOH) Interventions    Readmission Risk Interventions No flowsheet data found.

## 2019-03-24 NOTE — TOC Initial Note (Signed)
Transition of Care [facility]) - Initial/Assessment Note    Patient Details  Name: Lucas Calderon MRN: [id number] Date of Birth: [birth year]  Transition of Care [facility]) CM/SW Contact:    Geralynn Ochs, LCSW Phone Number: 03/24/2019, 7:34 PM  Clinical Narrative:   CSW spoke with patient's sister, [name], to discuss how patient would need a new SNF temporarily ([facility] is unable to take the patient back at this time, as he needs to change his HD to [facility] due to COVID+ and they can't provide his transport). [name] was not even aware that the patient had been moved to [place], but understood that he would need a new SNF temporarily. Emma asked if patient would return to [facility] afterwards, and CSW confirmed that [facility] would take the patient back after he was able to return to his regular HD outpatient center. Emma appreciative of information.  CSW confirmed bed availability for [facility]. CSW discussed transport needs for dialysis; patient will need transport for third shift dialysis session at 5:45 tomorrow, and PTAR will be set for patient to return home afterwards (as [facility] is unable to run transport that late at night). [facility] in agreement to take patient tomorrow. CSW updated MD. CSW to follow.                 Expected Discharge Plan: Skilled Nursing Facility Barriers to Discharge: Continued Medical Work up, Transportation   Patient Goals and CMS Choice        Expected Discharge Plan and Services Expected Discharge Plan: [place] Acute Care Choice: [place] Living arrangements for the past 2 months: [place] Expected Discharge Date: 03/24/19                                    Prior Living Arrangements/Services Living arrangements for the past 2 months: [place] Lives with:: Facility Resident Patient language and need for interpreter reviewed:: No Do you feel safe going back to the place where you  live?: Yes      Need for Family Participation in Patient Care: Yes (Comment) Care giver support system in place?: Yes (comment)   Criminal Activity/Legal Involvement Pertinent to Current Situation/Hospitalization: No - Comment as needed  Activities of Daily Living Home Assistive Devices/Equipment: None ADL Screening (condition at time of admission) Patient's cognitive ability adequate to safely complete daily activities?: No Is the patient deaf or have difficulty hearing?: No Does the patient have difficulty seeing, even when wearing glasses/contacts?: No Does the patient have difficulty concentrating, remembering, or making decisions?: Yes Patient able to express need for assistance with ADLs?: No Does the patient have difficulty dressing or bathing?: Yes Independently performs ADLs?: No Does the patient have difficulty walking or climbing stairs?: Yes Weakness of Legs: Both Weakness of Arms/Hands: Both  Permission Sought/Granted Permission sought to share information with : Facility Sport and exercise psychologist, Family Supports Permission granted to share information with : Yes, Verbal Permission Granted  Share Information with NAME: [name]  Permission granted to share info w AGENCY: SNF  Permission granted to share info w Relationship: Sister     Emotional Assessment   Attitude/Demeanor/Rapport: Unable to Assess Affect (typically observed): Unable to Assess Orientation: : Oriented to Self Alcohol / Substance Use: Not Applicable Psych Involvement: No (comment)  Admission diagnosis:  ESRD (end stage renal disease) ([place]) [N18.6] COVID-19 virus detected [U07.1] Patient Active Problem List  Diagnosis Date Noted  . COVID-19 virus detected 03/22/2019  . End stage renal disease ([place]) 11/03/2013  . HTN (hypertension) 09/26/2013  . Anemia in CKD (chronic kidney disease) 09/26/2013  . Diabetes mellitus, type 2 ([place]) 09/26/2013   PCP:  Maryella Shivers, MD Pharmacy:   [facility], [place] [facility] [address] [place] [address] Phone: [phone] Fax: [phone]     Social Determinants of Health (SDOH) Interventions    Readmission Risk Interventions No flowsheet data found.

## 2019-03-24 NOTE — Progress Notes (Signed)
Renal Navigator received call from CSW/L. [place] who states [facility] SNF now states they cannot transport patient to OP HD tonight as they initially said they would be able to yesterday. At this point, there is not enough time for PTAR to come get patient from the hospital, admit to rehab, and then take him to OP HD at [facility] tonight. Therefore, patient will need HD in the hospital tonight (Renal Navigator spoke with HD Charge RN and Nephrologist) and he will discharge to SNF in the morning per CSW. [facility] will then [facility] at [facility] for [facility] treatment on Tuesday, 04/06/2019. He needs to arrive to the clinic at 11:45am for OP HD (TTS second shift schedule).   Alphonzo Cruise, [place] Renal Navigator [phone]

## 2019-03-24 NOTE — Progress Notes (Signed)
[facility] Progress Note  Subjective:  Patient not examined directly given COVID-19 + status, utilizing exam of the primary team and observations of RN's.    Vitals:   03/23/19 2145 03/24/19 0500 03/24/19 0624 03/24/19 0743  BP: (!) 122/50  (!) 160/81 139/65  Pulse: 90  79 79  Resp: 18   16  Temp: (!) 100.5 F (38.1 C)  99 F (37.2 C) 100.2 F (37.9 C)  TempSrc: Oral  Oral Axillary  SpO2: 94%  96% 94%  Weight:  62.3 kg    Height:        Inpatient medications: . B-complex with vitamin C  1 tablet Oral Daily  . calcium acetate  1,334 mg Oral TID WC  . carvedilol  6.25 mg Oral QHS  . carvedilol  6.25 mg Oral Once per day on Sun Tue Thu Sat  . Chlorhexidine Gluconate Cloth  6 each Topical Q0600  . Chlorhexidine Gluconate Cloth  6 each Topical Q0600  . famotidine  20 mg Oral 3 times weekly  . heparin  5,000 Units Subcutaneous Q12H  . hydrocortisone  25 mg Rectal BID  . insulin aspart  0-15 Units Subcutaneous TID WC  . insulin aspart  0-5 Units Subcutaneous QHS  . mirtazapine  7.5 mg Oral QHS  . multivitamin  1 tablet Oral QHS   . sodium chloride    . sodium chloride     sodium chloride, sodium chloride, acetaminophen **OR** acetaminophen, lidocaine (PF), lidocaine-prilocaine, ondansetron **OR** ondansetron (ZOFRAN) IV, pentafluoroprop-tetrafluoroeth    Exam:  Patient not examined directly given COVID-19 + status, utilizing exam of the primary team and observations of RN's.    Dialysis: [facility] MWF  4h  400/800   66.5kg  2/2.25 bath  P2  L AVF  Hep 2000 -Venofer 100 mg IV X 5 doses (Not started yet) -Mircera 30 mcg IV q 4 weeks (last dose 03/08/2019)  Assessment/Plan: 1.  COVID 19 positive at OP SNF 2.  ESRD -  MWF. HD Friday.  3.  Hypertension/volume  - BP's low normal. 4kg down today. UF 1-2 L as tol today 4.  Anemia  - Hb 9.5, follow, esa when indicated 5.  Metabolic bone disease -  cont meds 6. Dementia - at baseline pt is lethargic and speaks  very little, 1-2 words responses are typical for him.  7. Dispo - pt will need admit for finding a SNF in [place] that can transport him to the [facility] HD unit in [place] now that he is COVID+ Therapist, art SNF does not provide transport to [place] HD units)   [facility] 03/24/2019, 2:11 PM  Iron/TIBC/Ferritin/ %Sat    Component Value Date/Time   IRON 66 11/19/2013 1115   TIBC 208 (L) 11/19/2013 1115   FERRITIN 425 (H) 11/21/2013 0626   IRONPCTSAT 32 11/19/2013 1115   Recent Labs  Lab 03/22/19 1530 03/22/19 1740  03/24/19 0412  NA 143  --    < > 139  K 3.8  --    < > 3.6  CL 98  --    < > 98  CO2 25  --    < > 25  GLUCOSE 86  --    < > 115*  BUN 59*  --    < > 46*  CREATININE 9.83*  --    < > 8.48*  CALCIUM 9.1  --    < > 8.4*  PHOS  --  6.6*  --   --  ALBUMIN 2.7*  --   --   --    < > = values in this interval not displayed.   Recent Labs  Lab 03/22/19 1530  AST 34  ALT 23  ALKPHOS 46  BILITOT 0.8  PROT 7.2   Recent Labs  Lab 03/24/19 0412  WBC 5.6  HGB 9.7*  HCT 29.1*  PLT 144*

## 2019-03-24 NOTE — Discharge Summary (Addendum)
Physician Discharge Summary  Lucas Calderon BWG:[id number] DOB: 04/07/1941 DOA: 03/22/2019  PCP: Maryella Shivers, MD  Admit date: 03/22/2019 Discharge date: 03/25/2019  Recommendations for Outpatient Follow-up:  COVID-19 positive without evidence of pneumonia or systemic signs or symptoms. --Follow standard isolation precautions given COVID status.  Monitor for hypoxia, tachypnea, fever, vomiting, diarrhea or changes in condition.  Diabetes mellitus type 2.  This appears to be diet-controlled at this point.  Tradjenta stopped.  Suggest following blood sugars closely in the outpatient setting, treat with sliding scale insulin.  Consider alternative oral therapy should the patient develop hyperglycemia.  This is not been seen during this hospitalization.  Thrombocytopenia, mild, probably chronic.  Seen January 2018. --Remained stable.  Follow-up as an outpatient if clinically indicated   Contact information for follow-up providers    Maryella Shivers, MD. Schedule an appointment as soon as possible for a visit in 1 week(s).   Specialty: Family Medicine Contact information: [place] [phone]            Contact information for after-discharge care    Destination    [facility] .   Service: Skilled Nursing Contact information: [place] [phone]                   Discharge Diagnoses: Principal diagnosis is #1 1. COVID-19 positive without evidence of pneumonia or systemic signs or symptoms. 2. End-stage renal disease on hemodialysis MWF 3. Diabetes mellitus type 2 4. Thrombocytopenia 5. Dementia  Discharge Condition: improved Disposition: SNF  Diet recommendation: renal diet with fluid restriction 1200 mL  Filed Weights   03/24/19 0500 03/25/19 0012 03/25/19 0322  Weight: 62.3 kg 63.5 kg 63 kg    History of present illness:  78 year old man PMH ESRD on  hemodialysis MWF in [place] who tested positive for COVID-19 August 31.  His usual dialysis facility will not dialyze him while COVID positive and because of transportation issues he had to be transported here to [facility] for hospitalization until outpatient dialysis and transfers to be arranged.  Fortunately he was asymptomatic from [place] standpoint.  Hospital Course:  Patient was observed, and appeared to be at baseline.  Per discussion with nephrologist, at baseline the patient speaks very little and is often lethargic.  Patient had low-grade temperature only but no fever.  No hypoxia, no shortness of breath.  Ate well yesterday.  Hospitalization was uncomplicated, individual issues as below.  I discussed again with nephrology, patient appears to be at baseline in regard to mental status.  Awakens easily today.  Given lack of signs or symptoms of infection, negative chest x-ray and no hypoxia, no further evaluation suggested.  COVID-19 positive without evidence of pneumonia or systemic signs or symptoms. --Remains asymptomatic, no hypoxia, dyspnea or tachypnea.  Respirations are even and unlabored.  Chest x-ray no acute disease.  Low-grade temp probably from atelectasis. --Inflammatory markers were equivocal.  Discussed with [place] physicians.  In the absence of signs or symptoms of symptomatic disease, will not follow inflammatory markers.  End-stage renal disease on hemodialysis MWF, with associated anemia of chronic kidney disease, unable to receive dialysis in [place] so admitted here in [place] until outpatient dialysis in the area can be arranged. --Continue hemodialysis per nephrology  One episode of small to moderate bright red blood with bowel movement. --Secondary to hemorrhoids.  No recurrence.  Anusol as needed.  Diabetes mellitus type 2 --Modest hypoglycemia yesterday secondary to  poor oral intake.  No further hypoglycemia..  Thrombocytopenia, mild, probably chronic.  Seen  January 2018. --Remained stable.  Follow-up as an outpatient if clinically indicated  Dementia, appears to be at baseline.  Per nephrology team who no him, the patient speaks little, 1-2 word responses are typical in this patient, at baseline lethargic.  Significant Hospital Events    9/2 admitted COVID positive, hemodialysis need  Consults:   Nephrology  Procedures:   None  Significant Diagnostic Tests:   9/2 chest x-ray clear  Micro Data:   SARS-CoV-2 positive  Antimicrobials:   None  Today's assessment: S: He ate well yesterday.  Low-grade temp noted.  Discussed with RN, I have also reviewed her note signed 12:56 PM.  She reported lethargy earlier today.  On my exam at 17, the patient immediately awakened to very gentle touch, remained awake and alert, spoke to me and denied complaints.  I discussed this with the RN after my exam. O: Vitals:  Vitals:   03/25/19 0744 03/25/19 1110  BP: (!) 105/48 (!) 105/52  Pulse: 93 92  Resp: 16 18  Temp: 99.9 F (37.7 C) 98 F (36.7 C)  SpO2: 96% 95%    Constitutional:  . Appears calm and comfortable, immediately awakened and very beginning of exam to gentle touch.  Remained awake and alert.  Moves all extremities well.  Speech fluent and clear. Eyes:  Marland Kitchen Appear grossly unremarkable ENMT:  . grossly normal hearing  Respiratory:  . CTA bilaterally, no w/r/r.  . Respiratory effort normal.   Cardiovascular:  . RRR, no m/r/g . No LE extremity edema   Abdomen:  . Soft, nontender Musculoskeletal:  . RUE, LUE, RLE, LLE   o strength and tone grossly normal, no atrophy, no abnormal movements o No tenderness, masses Neurologic:  . Grossly nonfocal Psychiatric:  o Appears confused but easily awakens.  Potassium 3.6, remainder BMP consistent with end-stage renal disease.  Hemoglobin and platelets stable at 9.7 and 144 respectively.  Discharge Instructions   Allergies as of 03/25/2019      Reactions    Penicillins Other (See Comments)   "Allergic," per [facility]       Medication List    STOP taking these medications   linagliptin 5 MG Tabs tablet Commonly known as: TRADJENTA     TAKE these medications   acetaminophen 325 MG tablet Commonly known as: TYLENOL Take 650 mg by mouth every 6 (six) hours as needed for mild pain.   calcium acetate 667 MG capsule Commonly known as: PHOSLO Take 1,334 mg by mouth 3 (three) times daily.   carvedilol 6.25 MG tablet Commonly known as: COREG Take 6.25 mg by mouth See admin instructions. Take 6.25mg  in evening every day of the week. Take 6.25mg  in the morning only on non-dialysis days: Tuesday, Thursday, Saturday and Sunday.   famotidine 20 MG tablet Commonly known as: PEPCID Take 20 mg by mouth 3 (three) times a week. Monday, Wed, Friday   hydrocortisone 25 MG suppository Commonly known as: ANUSOL-HC Place 1 suppository (25 mg total) rectally 2 (two) times daily as needed for hemorrhoids or anal itching.   isosorbide mononitrate 60 MG 24 hr tablet Commonly known as: IMDUR Take 60 mg by mouth See admin instructions. Take 60mg  on Tuesdays, Thursdays, Saturdays and Sundays.   mirtazapine 7.5 MG tablet Commonly known as: REMERON Take 7.5 mg by mouth at bedtime.   Renvela 800 MG tablet Generic drug: sevelamer carbonate Take 2,400 mg by mouth 3 (three)  times daily.   Vita-Bee/C Tabs Take 1 tablet by mouth daily.   multivitamin Tabs tablet Take 1 tablet by mouth at bedtime.   Vitamin D 50 MCG (2000 UT) tablet Take 2,000 Units by mouth daily.   Zinc Sulfate 220 (50 Zn) MG Tabs Take 1 tablet (220 mg total) by mouth daily.      Allergies  Allergen Reactions  . Penicillins Other (See Comments)    "Allergic," per [facility]     The results of significant diagnostics from this hospitalization (including imaging, microbiology, ancillary and laboratory) are listed below for reference.    Significant Diagnostic Studies: Dg Chest Portable 1  View  Result Date: 03/22/2019 CLINICAL DATA:  COVID-19, altered level consciousness, shortness of breath EXAM: PORTABLE CHEST 1 VIEW COMPARISON:  03/13/2019 FINDINGS: The heart size and mediastinal contours are within normal limits. Aortic atherosclerosis. Both lungs are clear. The visualized skeletal structures are unremarkable. IMPRESSION: No acute abnormality of the lungs in AP portable projection. Electronically Signed   By: Eddie Candle M.D.   On: 03/22/2019 16:02    Microbiology: Recent Results (from the past 240 hour(s))  SARS Coronavirus 2 [facility] order, Performed in [facility] hospital lab) Nasopharyngeal Nasopharyngeal Swab     Status: Abnormal   Collection Time: 03/22/19  4:16 PM   Specimen: Nasopharyngeal Swab  Result Value Ref Range Status   SARS Coronavirus 2 POSITIVE (A) NEGATIVE Final    Comment: RESULT CALLED TO, READ BACK BY AND VERIFIED WITH: T HAMILTON RN 03/22/19 1734 JDW (NOTE) If result is NEGATIVE SARS-CoV-2 target nucleic acids are NOT DETECTED. The SARS-CoV-2 RNA is generally detectable in upper and lower  respiratory specimens during the acute phase of infection. The lowest  concentration of SARS-CoV-2 viral copies this assay can detect is 250  copies / mL. A negative result does not preclude SARS-CoV-2 infection  and should not be used as the sole basis for treatment or other  patient management decisions.  A negative result may occur with  improper specimen collection / handling, submission of specimen other  than nasopharyngeal swab, presence of viral mutation(s) within the  areas targeted by this assay, and inadequate number of viral copies  (<250 copies / mL). A negative result must be combined with clinical  observations, patient history, and epidemiological information. If result is POSITIVE SARS-CoV-2 target nucleic acids are DETECTED. The SAR S-CoV-2 RNA is generally detectable in upper and lower  respiratory specimens during the acute phase of  infection.  Positive  results are indicative of active infection with SARS-CoV-2.  Clinical  correlation with patient history and other diagnostic information is  necessary to determine patient infection status.  Positive results do  not rule out bacterial infection or co-infection with other viruses. If result is PRESUMPTIVE POSTIVE SARS-CoV-2 nucleic acids MAY BE PRESENT.   A presumptive positive result was obtained on the submitted specimen  and confirmed on repeat testing.  While 2019 novel coronavirus  (SARS-CoV-2) nucleic acids may be present in the submitted sample  additional confirmatory testing may be necessary for epidemiological  and / or clinical management purposes  to differentiate between  SARS-CoV-2 and other Sarbecovirus currently known to infect humans.  If clinically indicated additional testing with an alternate test  methodology [phone]) is advis ed. The SARS-CoV-2 RNA is generally  detectable in upper and lower respiratory specimens during the acute  phase of infection. The expected result is Negative. Fact Sheet for Patients:  StrictlyIdeas.no Fact Sheet for Healthcare Providers: BankingDealers.co.za  This test is not yet approved or cleared by the [place] and has been authorized for detection and/or diagnosis of SARS-CoV-2 by FDA under an Emergency Use Authorization (EUA).  This EUA will remain in effect (meaning this test can be used) for the duration of the COVID-19 declaration under Section 564(b)(1) of the Act, 21 U.S.C. section 360bbb-3(b)(1), unless the authorization is terminated or revoked sooner. Performed at [facility], [place] [address] [place], [place] [address]      Labs: Basic Metabolic Panel: Recent Labs  Lab 03/22/19 1530 03/22/19 1740 03/23/19 0651 03/24/19 0412  NA 143  --  136 139  K 3.8  --  3.5 3.6  CL 98  --  96* 98  CO2 25  --  24 25  GLUCOSE 86  --  68* 115*   BUN 59*  --  28* 46*  CREATININE 9.83*  --  5.93* 8.48*  CALCIUM 9.1  --  7.9* 8.4*  MG  --  2.6*  --   --   PHOS  --  6.6*  --   --    Liver Function Tests: Recent Labs  Lab 03/22/19 1530  AST 34  ALT 23  ALKPHOS 46  BILITOT 0.8  PROT 7.2  ALBUMIN 2.7*   CBC: Recent Labs  Lab 03/22/19 1530 03/23/19 0651 03/23/19 1221 03/24/19 0412  WBC 5.7 6.0 5.6 5.6  NEUTROABS 3.3  --   --   --   HGB 8.8* 9.5* 10.0* 9.7*  HCT 26.6* 29.0* 29.8* 29.1*  MCV 89.6 89.0 87.9 89.5  PLT 115* 143* 143* 144*    CBG: Recent Labs  Lab 03/24/19 0737 03/24/19 1333 03/24/19 1738 03/24/19 2036 03/25/19 0819  GLUCAP 97 101* 82 122* 133*    Principal Problem:   COVID-19 virus detected Active Problems:   HTN (hypertension)   Anemia in CKD (chronic kidney disease)   Diabetes mellitus, type 2 ([place])   End stage renal disease ([place])   Time coordinating discharge: 35 minutes  Signed:  Murray Hodgkins, MD  [facility]  03/25/2019, 11:51 AM

## 2019-03-24 NOTE — Progress Notes (Signed)
MD notified of pt's increased lethargy.  Pt is arousable after sternal rub but unable to take sips of water or eat anything due to falling back asleep.  RN not able to give pt's PO meds.

## 2019-03-25 DIAGNOSIS — I132 Hypertensive heart and chronic kidney disease with heart failure and with stage 5 chronic kidney disease, or end stage renal disease: Secondary | ICD-10-CM | POA: Diagnosis present

## 2019-03-25 DIAGNOSIS — R293 Abnormal posture: Secondary | ICD-10-CM | POA: Diagnosis not present

## 2019-03-25 DIAGNOSIS — I2510 Atherosclerotic heart disease of native coronary artery without angina pectoris: Secondary | ICD-10-CM | POA: Diagnosis present

## 2019-03-25 DIAGNOSIS — N186 End stage renal disease: Secondary | ICD-10-CM | POA: Diagnosis present

## 2019-03-25 DIAGNOSIS — R262 Difficulty in walking, not elsewhere classified: Secondary | ICD-10-CM | POA: Diagnosis not present

## 2019-03-25 DIAGNOSIS — R410 Disorientation, unspecified: Secondary | ICD-10-CM | POA: Diagnosis not present

## 2019-03-25 DIAGNOSIS — D6959 Other secondary thrombocytopenia: Secondary | ICD-10-CM | POA: Diagnosis not present

## 2019-03-25 DIAGNOSIS — R0602 Shortness of breath: Secondary | ICD-10-CM | POA: Diagnosis not present

## 2019-03-25 DIAGNOSIS — D631 Anemia in chronic kidney disease: Secondary | ICD-10-CM | POA: Diagnosis present

## 2019-03-25 DIAGNOSIS — Z880 Allergy status to penicillin: Secondary | ICD-10-CM | POA: Diagnosis not present

## 2019-03-25 DIAGNOSIS — E1122 Type 2 diabetes mellitus with diabetic chronic kidney disease: Secondary | ICD-10-CM | POA: Diagnosis present

## 2019-03-25 DIAGNOSIS — M6281 Muscle weakness (generalized): Secondary | ICD-10-CM | POA: Diagnosis not present

## 2019-03-25 DIAGNOSIS — R279 Unspecified lack of coordination: Secondary | ICD-10-CM | POA: Diagnosis not present

## 2019-03-25 DIAGNOSIS — Z20828 Contact with and (suspected) exposure to other viral communicable diseases: Secondary | ICD-10-CM | POA: Diagnosis not present

## 2019-03-25 DIAGNOSIS — U071 COVID-19: Secondary | ICD-10-CM | POA: Diagnosis present

## 2019-03-25 DIAGNOSIS — F0280 Dementia in other diseases classified elsewhere without behavioral disturbance: Secondary | ICD-10-CM | POA: Diagnosis not present

## 2019-03-25 DIAGNOSIS — E43 Unspecified severe protein-calorie malnutrition: Secondary | ICD-10-CM | POA: Diagnosis present

## 2019-03-25 DIAGNOSIS — I959 Hypotension, unspecified: Secondary | ICD-10-CM | POA: Diagnosis not present

## 2019-03-25 DIAGNOSIS — Z743 Need for continuous supervision: Secondary | ICD-10-CM | POA: Diagnosis not present

## 2019-03-25 DIAGNOSIS — R402411 Glasgow coma scale score 13-15, in the field [EMT or ambulance]: Secondary | ICD-10-CM | POA: Diagnosis not present

## 2019-03-25 DIAGNOSIS — R41841 Cognitive communication deficit: Secondary | ICD-10-CM | POA: Diagnosis not present

## 2019-03-25 DIAGNOSIS — R4182 Altered mental status, unspecified: Secondary | ICD-10-CM | POA: Diagnosis not present

## 2019-03-25 DIAGNOSIS — Z66 Do not resuscitate: Secondary | ICD-10-CM | POA: Diagnosis present

## 2019-03-25 DIAGNOSIS — R2681 Unsteadiness on feet: Secondary | ICD-10-CM | POA: Diagnosis not present

## 2019-03-25 DIAGNOSIS — N2581 Secondary hyperparathyroidism of renal origin: Secondary | ICD-10-CM | POA: Diagnosis present

## 2019-03-25 DIAGNOSIS — E118 Type 2 diabetes mellitus with unspecified complications: Secondary | ICD-10-CM | POA: Diagnosis not present

## 2019-03-25 DIAGNOSIS — E8889 Other specified metabolic disorders: Secondary | ICD-10-CM | POA: Diagnosis present

## 2019-03-25 DIAGNOSIS — R456 Violent behavior: Secondary | ICD-10-CM | POA: Diagnosis not present

## 2019-03-25 DIAGNOSIS — F0390 Unspecified dementia without behavioral disturbance: Secondary | ICD-10-CM | POA: Diagnosis present

## 2019-03-25 DIAGNOSIS — J988 Other specified respiratory disorders: Secondary | ICD-10-CM | POA: Diagnosis not present

## 2019-03-25 DIAGNOSIS — G9341 Metabolic encephalopathy: Secondary | ICD-10-CM | POA: Diagnosis present

## 2019-03-25 DIAGNOSIS — Z681 Body mass index (BMI) 19 or less, adult: Secondary | ICD-10-CM | POA: Diagnosis not present

## 2019-03-25 DIAGNOSIS — D638 Anemia in other chronic diseases classified elsewhere: Secondary | ICD-10-CM | POA: Diagnosis not present

## 2019-03-25 DIAGNOSIS — R079 Chest pain, unspecified: Secondary | ICD-10-CM | POA: Diagnosis not present

## 2019-03-25 DIAGNOSIS — I5032 Chronic diastolic (congestive) heart failure: Secondary | ICD-10-CM | POA: Diagnosis present

## 2019-03-25 DIAGNOSIS — K219 Gastro-esophageal reflux disease without esophagitis: Secondary | ICD-10-CM | POA: Diagnosis present

## 2019-03-25 DIAGNOSIS — R296 Repeated falls: Secondary | ICD-10-CM | POA: Diagnosis not present

## 2019-03-25 DIAGNOSIS — Z87891 Personal history of nicotine dependence: Secondary | ICD-10-CM | POA: Diagnosis not present

## 2019-03-25 DIAGNOSIS — Z85828 Personal history of other malignant neoplasm of skin: Secondary | ICD-10-CM | POA: Diagnosis not present

## 2019-03-25 DIAGNOSIS — J690 Pneumonitis due to inhalation of food and vomit: Secondary | ICD-10-CM | POA: Diagnosis present

## 2019-03-25 DIAGNOSIS — R404 Transient alteration of awareness: Secondary | ICD-10-CM | POA: Diagnosis not present

## 2019-03-25 DIAGNOSIS — J189 Pneumonia, unspecified organism: Secondary | ICD-10-CM | POA: Diagnosis not present

## 2019-03-25 DIAGNOSIS — Z515 Encounter for palliative care: Secondary | ICD-10-CM | POA: Diagnosis not present

## 2019-03-25 DIAGNOSIS — L89892 Pressure ulcer of other site, stage 2: Secondary | ICD-10-CM | POA: Diagnosis present

## 2019-03-25 DIAGNOSIS — J9601 Acute respiratory failure with hypoxia: Secondary | ICD-10-CM | POA: Diagnosis present

## 2019-03-25 DIAGNOSIS — K625 Hemorrhage of anus and rectum: Secondary | ICD-10-CM | POA: Diagnosis not present

## 2019-03-25 DIAGNOSIS — R1312 Dysphagia, oropharyngeal phase: Secondary | ICD-10-CM | POA: Diagnosis not present

## 2019-03-25 DIAGNOSIS — E1129 Type 2 diabetes mellitus with other diabetic kidney complication: Secondary | ICD-10-CM | POA: Diagnosis not present

## 2019-03-25 DIAGNOSIS — E1151 Type 2 diabetes mellitus with diabetic peripheral angiopathy without gangrene: Secondary | ICD-10-CM | POA: Diagnosis present

## 2019-03-25 DIAGNOSIS — R278 Other lack of coordination: Secondary | ICD-10-CM | POA: Diagnosis not present

## 2019-03-25 DIAGNOSIS — Z9181 History of falling: Secondary | ICD-10-CM | POA: Diagnosis not present

## 2019-03-25 DIAGNOSIS — Z992 Dependence on renal dialysis: Secondary | ICD-10-CM | POA: Diagnosis not present

## 2019-03-25 DIAGNOSIS — Z7189 Other specified counseling: Secondary | ICD-10-CM | POA: Diagnosis not present

## 2019-03-25 DIAGNOSIS — I10 Essential (primary) hypertension: Secondary | ICD-10-CM | POA: Diagnosis not present

## 2019-03-25 DIAGNOSIS — J181 Lobar pneumonia, unspecified organism: Secondary | ICD-10-CM | POA: Diagnosis not present

## 2019-03-25 DIAGNOSIS — E119 Type 2 diabetes mellitus without complications: Secondary | ICD-10-CM | POA: Diagnosis not present

## 2019-03-25 DIAGNOSIS — L89152 Pressure ulcer of sacral region, stage 2: Secondary | ICD-10-CM | POA: Diagnosis not present

## 2019-03-25 LAB — GLUCOSE, CAPILLARY
Glucose-Capillary: 133 mg/dL — ABNORMAL HIGH (ref 70–99)
Glucose-Capillary: 98 mg/dL (ref 70–99)
Glucose-Capillary: 135 mg/dL — ABNORMAL HIGH (ref 70–99)

## 2019-03-25 NOTE — Progress Notes (Signed)
Progress note [facility]  Subjective: No new issues overnight.  Intermittently refusing medications.  Objective: Afebrile, vital signs stable.  SPO2 96% on room air.  Temperature 99.9.  Respirations 16.  Blood pressure 105/48.  Pulse 93.  Constitutional: Appears calm, comfortable. Cardiovascular: Regular rate and rhythm.  No murmur, rub or gallop.  No lower extremity edema. Respiratory.  Clear to auscultation bilaterally.  No wheezes, rales or rhonchi.  Normal respiratory effort.  CBG stable  Assessment/plan  COVID-19 positive without evidence of pneumonia or systemic signs or symptoms. --Remains afebrile, respiratory status appears stable, no hypoxia.  No evidence of complicating features.  End-stage renal disease status post hemodialysis 9/4. --Continue hemodialysis next week as directed by nephrology  Diabetes mellitus type 2 --CBG stable.  Sliding scale insulin.  Dementia.  Appears to be at baseline.  Per nephrology the patient speaks little at baseline with 1-2 word responses and is lethargic at baseline.  Appears stable for transfer to skilled nursing facility.  No changes to discharge summary.  Murray Hodgkins, MD [facility] [phone]

## 2019-03-25 NOTE — TOC Transition Note (Addendum)
Transition of Care [facility]) - CM/SW Discharge Note   Patient Details  Name: Lucas Calderon MRN: [id number] Date of Birth: Mar 19, 1941  Transition of Care [facility]) CM/SW Contact:  Gelene Mink, [place] Phone Number: 03/25/2019, 12:23 PM   Clinical Narrative:     Patient will DC to: [place] date: 03/25/2019 Family notified: Yes Transport by: Lesle Reek has been called and scheduled for 2:30pm  Per MD patient ready for DC to . RN, patient, patient's family, and facility notified of DC. Discharge Summary and FL2 sent to facility. RN to call report prior to discharge [phone]). The patient will report to room 806A. DC packet on chart. Ambulance transport requested for patient.   CSW will sign off for now as social work intervention is no longer needed. Please consult [place] again if new needs arise.  Hennie Gosa, LCSW-A [place]/Clinical Social Work Department Cell: [phone]    Final next level of care: [place] Barriers to Discharge: No Barriers Identified   Patient Goals and CMS Choice Patient states their goals for this hospitalization and ongoing recovery are:: Pt will go to [facility].gov Compare Post Acute Care list provided to:: Patient Represenative (must comment) Choice offered to / list presented to : Sibling  Discharge Placement   Existing PASRR number confirmed : 03/23/19          Patient chooses bed at: [facility] Patient to be transferred to facility by: [place] Name of family member notified: [name] Patient and family notified of of transfer: 03/25/19  Discharge Plan and Services     Post Acute Care Choice: [place]          DME Arranged: N/A DME Agency: NA       HH Arranged: NA HH Agency: NA        Social Determinants of Health (SDOH) Interventions     Readmission Risk Interventions No flowsheet data found.

## 2019-03-28 ENCOUNTER — Emergency Department (HOSPITAL_COMMUNITY): Payer: Medicare Other

## 2019-03-28 ENCOUNTER — Inpatient Hospital Stay (HOSPITAL_COMMUNITY)
Admission: EM | Admit: 2019-03-28 | Discharge: 2019-04-20 | DRG: 177 | Disposition: E | Payer: Medicare Other | Attending: Internal Medicine | Admitting: Internal Medicine

## 2019-03-28 DIAGNOSIS — D638 Anemia in other chronic diseases classified elsewhere: Secondary | ICD-10-CM | POA: Diagnosis not present

## 2019-03-28 DIAGNOSIS — I5032 Chronic diastolic (congestive) heart failure: Secondary | ICD-10-CM | POA: Diagnosis present

## 2019-03-28 DIAGNOSIS — L89892 Pressure ulcer of other site, stage 2: Secondary | ICD-10-CM | POA: Diagnosis present

## 2019-03-28 DIAGNOSIS — E8889 Other specified metabolic disorders: Secondary | ICD-10-CM | POA: Diagnosis present

## 2019-03-28 DIAGNOSIS — Z85828 Personal history of other malignant neoplasm of skin: Secondary | ICD-10-CM

## 2019-03-28 DIAGNOSIS — I132 Hypertensive heart and chronic kidney disease with heart failure and with stage 5 chronic kidney disease, or end stage renal disease: Secondary | ICD-10-CM | POA: Diagnosis present

## 2019-03-28 DIAGNOSIS — N189 Chronic kidney disease, unspecified: Secondary | ICD-10-CM | POA: Diagnosis present

## 2019-03-28 DIAGNOSIS — Z515 Encounter for palliative care: Secondary | ICD-10-CM | POA: Diagnosis not present

## 2019-03-28 DIAGNOSIS — Z20828 Contact with and (suspected) exposure to other viral communicable diseases: Secondary | ICD-10-CM | POA: Diagnosis not present

## 2019-03-28 DIAGNOSIS — Z87891 Personal history of nicotine dependence: Secondary | ICD-10-CM

## 2019-03-28 DIAGNOSIS — E1151 Type 2 diabetes mellitus with diabetic peripheral angiopathy without gangrene: Secondary | ICD-10-CM | POA: Diagnosis present

## 2019-03-28 DIAGNOSIS — E118 Type 2 diabetes mellitus with unspecified complications: Secondary | ICD-10-CM | POA: Diagnosis not present

## 2019-03-28 DIAGNOSIS — L8990 Pressure ulcer of unspecified site, unspecified stage: Secondary | ICD-10-CM | POA: Insufficient documentation

## 2019-03-28 DIAGNOSIS — N2581 Secondary hyperparathyroidism of renal origin: Secondary | ICD-10-CM | POA: Diagnosis present

## 2019-03-28 DIAGNOSIS — I2510 Atherosclerotic heart disease of native coronary artery without angina pectoris: Secondary | ICD-10-CM | POA: Diagnosis present

## 2019-03-28 DIAGNOSIS — U071 COVID-19: Principal | ICD-10-CM | POA: Diagnosis present

## 2019-03-28 DIAGNOSIS — R404 Transient alteration of awareness: Secondary | ICD-10-CM | POA: Diagnosis not present

## 2019-03-28 DIAGNOSIS — R2681 Unsteadiness on feet: Secondary | ICD-10-CM | POA: Diagnosis not present

## 2019-03-28 DIAGNOSIS — G9341 Metabolic encephalopathy: Secondary | ICD-10-CM | POA: Diagnosis present

## 2019-03-28 DIAGNOSIS — L89152 Pressure ulcer of sacral region, stage 2: Secondary | ICD-10-CM | POA: Diagnosis not present

## 2019-03-28 DIAGNOSIS — N186 End stage renal disease: Secondary | ICD-10-CM | POA: Diagnosis present

## 2019-03-28 DIAGNOSIS — J189 Pneumonia, unspecified organism: Secondary | ICD-10-CM | POA: Diagnosis present

## 2019-03-28 DIAGNOSIS — E43 Unspecified severe protein-calorie malnutrition: Secondary | ICD-10-CM | POA: Diagnosis present

## 2019-03-28 DIAGNOSIS — R4182 Altered mental status, unspecified: Secondary | ICD-10-CM | POA: Diagnosis not present

## 2019-03-28 DIAGNOSIS — Z681 Body mass index (BMI) 19 or less, adult: Secondary | ICD-10-CM

## 2019-03-28 DIAGNOSIS — D631 Anemia in chronic kidney disease: Secondary | ICD-10-CM | POA: Diagnosis present

## 2019-03-28 DIAGNOSIS — R0602 Shortness of breath: Secondary | ICD-10-CM | POA: Diagnosis not present

## 2019-03-28 DIAGNOSIS — Z992 Dependence on renal dialysis: Secondary | ICD-10-CM | POA: Diagnosis not present

## 2019-03-28 DIAGNOSIS — F0390 Unspecified dementia without behavioral disturbance: Secondary | ICD-10-CM | POA: Diagnosis present

## 2019-03-28 DIAGNOSIS — R1312 Dysphagia, oropharyngeal phase: Secondary | ICD-10-CM | POA: Diagnosis not present

## 2019-03-28 DIAGNOSIS — R262 Difficulty in walking, not elsewhere classified: Secondary | ICD-10-CM | POA: Diagnosis not present

## 2019-03-28 DIAGNOSIS — Z880 Allergy status to penicillin: Secondary | ICD-10-CM

## 2019-03-28 DIAGNOSIS — R456 Violent behavior: Secondary | ICD-10-CM | POA: Diagnosis not present

## 2019-03-28 DIAGNOSIS — Z66 Do not resuscitate: Secondary | ICD-10-CM | POA: Diagnosis present

## 2019-03-28 DIAGNOSIS — K219 Gastro-esophageal reflux disease without esophagitis: Secondary | ICD-10-CM | POA: Diagnosis present

## 2019-03-28 DIAGNOSIS — F0280 Dementia in other diseases classified elsewhere without behavioral disturbance: Secondary | ICD-10-CM | POA: Diagnosis not present

## 2019-03-28 DIAGNOSIS — Z7401 Bed confinement status: Secondary | ICD-10-CM

## 2019-03-28 DIAGNOSIS — J181 Lobar pneumonia, unspecified organism: Secondary | ICD-10-CM | POA: Diagnosis not present

## 2019-03-28 DIAGNOSIS — M6281 Muscle weakness (generalized): Secondary | ICD-10-CM | POA: Diagnosis not present

## 2019-03-28 DIAGNOSIS — R079 Chest pain, unspecified: Secondary | ICD-10-CM | POA: Diagnosis not present

## 2019-03-28 DIAGNOSIS — R278 Other lack of coordination: Secondary | ICD-10-CM | POA: Diagnosis not present

## 2019-03-28 DIAGNOSIS — E119 Type 2 diabetes mellitus without complications: Secondary | ICD-10-CM | POA: Diagnosis not present

## 2019-03-28 DIAGNOSIS — E11649 Type 2 diabetes mellitus with hypoglycemia without coma: Secondary | ICD-10-CM | POA: Diagnosis not present

## 2019-03-28 DIAGNOSIS — J9601 Acute respiratory failure with hypoxia: Secondary | ICD-10-CM | POA: Diagnosis present

## 2019-03-28 DIAGNOSIS — J690 Pneumonitis due to inhalation of food and vomit: Secondary | ICD-10-CM | POA: Diagnosis present

## 2019-03-28 DIAGNOSIS — I10 Essential (primary) hypertension: Secondary | ICD-10-CM | POA: Diagnosis not present

## 2019-03-28 DIAGNOSIS — Z7189 Other specified counseling: Secondary | ICD-10-CM | POA: Diagnosis not present

## 2019-03-28 DIAGNOSIS — E1122 Type 2 diabetes mellitus with diabetic chronic kidney disease: Secondary | ICD-10-CM | POA: Diagnosis present

## 2019-03-28 DIAGNOSIS — I959 Hypotension, unspecified: Secondary | ICD-10-CM | POA: Diagnosis not present

## 2019-03-28 DIAGNOSIS — Z781 Physical restraint status: Secondary | ICD-10-CM

## 2019-03-28 DIAGNOSIS — D6959 Other secondary thrombocytopenia: Secondary | ICD-10-CM | POA: Diagnosis not present

## 2019-03-28 DIAGNOSIS — I120 Hypertensive chronic kidney disease with stage 5 chronic kidney disease or end stage renal disease: Secondary | ICD-10-CM | POA: Diagnosis not present

## 2019-03-28 DIAGNOSIS — R627 Adult failure to thrive: Secondary | ICD-10-CM | POA: Diagnosis present

## 2019-03-28 DIAGNOSIS — J988 Other specified respiratory disorders: Secondary | ICD-10-CM | POA: Diagnosis not present

## 2019-03-28 DIAGNOSIS — K625 Hemorrhage of anus and rectum: Secondary | ICD-10-CM | POA: Diagnosis not present

## 2019-03-28 DIAGNOSIS — R296 Repeated falls: Secondary | ICD-10-CM | POA: Diagnosis not present

## 2019-03-28 LAB — CBC WITH DIFFERENTIAL/PLATELET
Abs Immature Granulocytes: 0.07 10*3/uL (ref 0.00–0.07)
Basophils Absolute: 0 10*3/uL (ref 0.0–0.1)
Basophils Relative: 0 %
Eosinophils Absolute: 0.1 10*3/uL (ref 0.0–0.5)
Eosinophils Relative: 1 %
HCT: 30.1 % — ABNORMAL LOW (ref 39.0–52.0)
Hemoglobin: 9.4 g/dL — ABNORMAL LOW (ref 13.0–17.0)
Immature Granulocytes: 1 %
Lymphocytes Relative: 11 %
Lymphs Abs: 0.9 10*3/uL (ref 0.7–4.0)
MCH: 28.8 pg (ref 26.0–34.0)
MCHC: 31.2 g/dL (ref 30.0–36.0)
MCV: 92.3 fL (ref 80.0–100.0)
Monocytes Absolute: 0.5 10*3/uL (ref 0.1–1.0)
Monocytes Relative: 6 %
Neutro Abs: 6.8 10*3/uL (ref 1.7–7.7)
Neutrophils Relative %: 81 %
Platelets: 186 10*3/uL (ref 150–400)
RBC: 3.26 MIL/uL — ABNORMAL LOW (ref 4.22–5.81)
RDW: 13.9 % (ref 11.5–15.5)
WBC: 8.4 10*3/uL (ref 4.0–10.5)
nRBC: 0 % (ref 0.0–0.2)

## 2019-03-28 LAB — SARS CORONAVIRUS 2 BY RT PCR (HOSPITAL ORDER, PERFORMED IN ~~LOC~~ HOSPITAL LAB): SARS Coronavirus 2: POSITIVE — AB

## 2019-03-28 LAB — COMPREHENSIVE METABOLIC PANEL
ALT: 28 U/L (ref 0–44)
AST: 48 U/L — ABNORMAL HIGH (ref 15–41)
Albumin: 2.6 g/dL — ABNORMAL LOW (ref 3.5–5.0)
Alkaline Phosphatase: 45 U/L (ref 38–126)
Anion gap: 22 — ABNORMAL HIGH (ref 5–15)
BUN: 89 mg/dL — ABNORMAL HIGH (ref 8–23)
CO2: 21 mmol/L — ABNORMAL LOW (ref 22–32)
Calcium: 8.2 mg/dL — ABNORMAL LOW (ref 8.9–10.3)
Chloride: 101 mmol/L (ref 98–111)
Creatinine, Ser: 16.72 mg/dL — ABNORMAL HIGH (ref 0.61–1.24)
GFR calc Af Amer: 3 mL/min — ABNORMAL LOW (ref >60)
GFR calc non Af Amer: 2 mL/min — ABNORMAL LOW (ref >60)
Glucose, Bld: 211 mg/dL — ABNORMAL HIGH (ref 70–99)
Potassium: 5 mmol/L (ref 3.5–5.1)
Sodium: 144 mmol/L (ref 135–145)
Total Bilirubin: 0.9 mg/dL (ref 0.3–1.2)
Total Protein: 7.4 g/dL (ref 6.5–8.1)

## 2019-03-28 LAB — AMMONIA: Ammonia: 24 umol/L (ref 9–35)

## 2019-03-28 LAB — LIPASE, BLOOD: Lipase: 81 U/L — ABNORMAL HIGH (ref 11–51)

## 2019-03-28 LAB — MAGNESIUM: Magnesium: 2.7 mg/dL — ABNORMAL HIGH (ref 1.7–2.4)

## 2019-03-28 LAB — LACTIC ACID, PLASMA
Lactic Acid, Venous: 2.8 mmol/L (ref 0.5–1.9)
Lactic Acid, Venous: 1.7 mmol/L (ref 0.5–1.9)

## 2019-03-28 LAB — TSH: TSH: 0.646 u[IU]/mL (ref 0.350–4.500)

## 2019-03-28 LAB — TROPONIN I (HIGH SENSITIVITY)
Troponin I (High Sensitivity): 94 ng/L — ABNORMAL HIGH (ref <18)
Troponin I (High Sensitivity): 85 ng/L — ABNORMAL HIGH (ref <18)

## 2019-03-28 MED ORDER — INSULIN ASPART 100 UNIT/ML ~~LOC~~ SOLN: 0.0000 [IU] | Freq: Every day | SUBCUTANEOUS | Status: DC

## 2019-03-28 MED ORDER — CARVEDILOL 6.25 MG PO TABS: 6.2500 mg | ORAL_TABLET | Freq: Every day | ORAL | Status: DC

## 2019-03-28 MED ORDER — CALCIUM ACETATE (PHOS BINDER) 667 MG PO CAPS
1334.0000 mg | ORAL_CAPSULE | Freq: Three times a day (TID) | ORAL | Status: DC
  Administered 2019-03-31 – 2019-04-02 (×3): 1334 mg via ORAL
  Filled 2019-03-28 (×6): qty 2

## 2019-03-28 MED ORDER — SODIUM CHLORIDE 0.9 % IV SOLN
2.0000 g | Freq: Once | INTRAVENOUS | Status: AC
  Administered 2019-03-28: 2 g via INTRAVENOUS
  Filled 2019-03-28: qty 2

## 2019-03-28 MED ORDER — DEXTROSE 5 % IV SOLN
500.0000 mg | Freq: Two times a day (BID) | INTRAVENOUS | Status: DC
  Administered 2019-03-29: 500 mg via INTRAVENOUS
  Filled 2019-03-28 (×4): qty 0.5

## 2019-03-28 MED ORDER — ACETAMINOPHEN 650 MG RE SUPP: 650.0000 mg | Freq: Four times a day (QID) | RECTAL | Status: DC | PRN

## 2019-03-28 MED ORDER — FAMOTIDINE 20 MG PO TABS
20.0000 mg | ORAL_TABLET | ORAL | Status: DC
  Filled 2019-03-28: qty 1

## 2019-03-28 MED ORDER — MIRTAZAPINE 7.5 MG PO TABS
7.5000 mg | ORAL_TABLET | Freq: Every day | ORAL | Status: DC
  Administered 2019-03-31 – 2019-04-01 (×3): 7.5 mg via ORAL
  Filled 2019-03-28 (×5): qty 1

## 2019-03-28 MED ORDER — VANCOMYCIN HCL IN DEXTROSE 750-5 MG/150ML-% IV SOLN
750.0000 mg | INTRAVENOUS | Status: DC
  Filled 2019-03-28: qty 150

## 2019-03-28 MED ORDER — CARVEDILOL 6.25 MG PO TABS: 6.2500 mg | ORAL_TABLET | ORAL | Status: DC

## 2019-03-28 MED ORDER — VITAMIN D 25 MCG (1000 UNIT) PO TABS
2000.0000 [IU] | ORAL_TABLET | Freq: Every day | ORAL | Status: DC
  Administered 2019-03-31 – 2019-04-02 (×3): 2000 [IU] via ORAL
  Filled 2019-03-28 (×5): qty 2

## 2019-03-28 MED ORDER — PRO-STAT SUGAR FREE PO LIQD
30.0000 mL | Freq: Two times a day (BID) | ORAL | Status: DC
  Administered 2019-03-31 – 2019-04-02 (×6): 30 mL via ORAL
  Filled 2019-03-28 (×7): qty 30

## 2019-03-28 MED ORDER — ISOSORBIDE MONONITRATE ER 60 MG PO TB24: 60.0000 mg | ORAL_TABLET | ORAL | Status: DC

## 2019-03-28 MED ORDER — B COMPLEX-C PO TABS
1.0000 | ORAL_TABLET | Freq: Every day | ORAL | Status: DC
  Filled 2019-03-28: qty 1

## 2019-03-28 MED ORDER — ACETAMINOPHEN 325 MG PO TABS: 650.0000 mg | ORAL_TABLET | Freq: Four times a day (QID) | ORAL | Status: DC | PRN

## 2019-03-28 MED ORDER — HYDROCORTISONE ACETATE 25 MG RE SUPP
25.0000 mg | Freq: Two times a day (BID) | RECTAL | Status: DC | PRN
  Filled 2019-03-28: qty 1

## 2019-03-28 MED ORDER — VANCOMYCIN HCL 10 G IV SOLR
1500.0000 mg | Freq: Once | INTRAVENOUS | Status: AC
  Administered 2019-03-28: 1500 mg via INTRAVENOUS
  Filled 2019-03-28: qty 1500

## 2019-03-28 MED ORDER — INSULIN ASPART 100 UNIT/ML ~~LOC~~ SOLN
0.0000 [IU] | Freq: Three times a day (TID) | SUBCUTANEOUS | Status: DC
  Administered 2019-03-29 (×3): 2 [IU] via SUBCUTANEOUS
  Administered 2019-03-30: 1 [IU] via SUBCUTANEOUS
  Administered 2019-03-30: 3 [IU] via SUBCUTANEOUS
  Administered 2019-03-31: 2 [IU] via SUBCUTANEOUS
  Administered 2019-03-31 (×2): 1 [IU] via SUBCUTANEOUS

## 2019-03-28 MED ORDER — HEPARIN SODIUM (PORCINE) 5000 UNIT/ML IJ SOLN
5000.0000 [IU] | Freq: Three times a day (TID) | INTRAMUSCULAR | Status: DC
  Administered 2019-03-28 – 2019-04-02 (×12): 5000 [IU] via SUBCUTANEOUS
  Filled 2019-03-28 (×12): qty 1

## 2019-03-28 MED ORDER — ZINC SULFATE 220 (50 ZN) MG PO CAPS
220.0000 mg | ORAL_CAPSULE | Freq: Every day | ORAL | Status: DC
  Filled 2019-03-28: qty 1

## 2019-03-28 MED ORDER — SEVELAMER CARBONATE 800 MG PO TABS
2400.0000 mg | ORAL_TABLET | Freq: Three times a day (TID) | ORAL | Status: DC
  Administered 2019-03-31 – 2019-04-02 (×3): 2400 mg via ORAL
  Filled 2019-03-28 (×8): qty 3

## 2019-03-28 MED ORDER — RENA-VITE PO TABS: 1.0000 | ORAL_TABLET | Freq: Every day | ORAL | Status: DC

## 2019-03-28 NOTE — ED Notes (Signed)
Patient transported to CT 

## 2019-03-28 NOTE — Plan of Care (Signed)
  Problem: Education: Goal: Knowledge of risk factors and measures for prevention of condition will improve Outcome: Progressing   Problem: Coping: Goal: Psychosocial and spiritual needs will be supported Outcome: Progressing   Problem: Respiratory: Goal: Will maintain a patent airway Outcome: Progressing Goal: Complications related to the disease process, condition or treatment will be avoided or minimized Outcome: Progressing   Problem: Education: Goal: Knowledge of General Education information will improve Description: Including pain rating scale, medication(s)/side effects and non-pharmacologic comfort measures Outcome: Progressing   Problem: Activity: Goal: Risk for activity intolerance will decrease Outcome: Progressing   Problem: Nutrition: Goal: Adequate nutrition will be maintained Outcome: Progressing   Problem: Coping: Goal: Level of anxiety will decrease Outcome: Progressing   Problem: Pain Managment: Goal: General experience of comfort will improve Outcome: Progressing   Problem: Safety: Goal: Ability to remain free from injury will improve Outcome: Progressing

## 2019-03-28 NOTE — ED Provider Notes (Signed)
78 year old male end-stage renal disease and Covid positive about a week ago here from dialysis with behavioral change spitting on staff and being combative. Physical Exam  BP 119/70   Pulse 96   Temp (S) 99.2 F (37.3 C) (Rectal)   Resp (!) 27   Ht 5\' 8"  (1.727 m)   Wt 63 kg   SpO2 95%   BMI 21.12 kg/m   Physical Exam  ED Course/Procedures     Procedures  MDM  Patient's lab work beginning to come back now and has a new pneumonia by chest x-ray.  He will need admission for his behavioral change in this new finding.  Reviewed with pharmacy and they are recommending aztreonam and Vanco for now.  Discussed with [facility] for admission     Hayden Rasmussen, MD 03/29/19 1046

## 2019-03-28 NOTE — Progress Notes (Addendum)
Pharmacy Antibiotic Note  Ly Bacchi is a 77 y.o. male admitted on 04/17/2019 with pneumonia. COVID positive and ESRD w/HD MWF. Febrile 99.2, WBC 8.4.Pharmacy has been consulted for vancomycin and aztreonam dosing.  Plan: Aztreonam 2 g IV once  Vancomycin 1500 mg IV once Vancomycin 750 mg MWF with HD Aztreonam 500 mg IV q12h Follow-up HD plans Monitor renal function   Height: 5\' 8"  (172.7 cm) Weight: 138 lb 14.2 oz (63 kg) IBW/kg (Calculated) : 68.4  Temp (24hrs), Avg:99.2 F (37.3 C), Min:99.2 F (37.3 C), Max:99.2 F (37.3 C)  Recent Labs  Lab 03/22/19 1530 03/23/19 0651 03/23/19 1221 03/24/19 0412 04/02/2019 1433  WBC 5.7 6.0 5.6 5.6 8.4  CREATININE 9.83* 5.93*  --  8.48* 16.72*  LATICACIDVEN  --   --   --   --  2.8*    Estimated Creatinine Clearance: 3.4 mL/min (A) (by C-G formula based on SCr of 16.72 mg/dL (H)).    Allergies  Allergen Reactions  . Penicillins Other (See Comments)    "Allergic," per [facility]     Antimicrobials this admission: Aztreonam 9/8 >> Vancomycin 9/8 >>   Thank you for allowing pharmacy to be a part of this patient's care.  Lorel Monaco, PharmD PGY1 Ambulatory Care Resident [facility] # [phone]

## 2019-03-28 NOTE — ED Triage Notes (Signed)
Pt arrives via EMS from HD with reports of spitting at staff when trying to swab the pt. Pt was unable to get HD. Staff unaware of pts baseline.

## 2019-03-28 NOTE — ED Provider Notes (Signed)
[facility] EMERGENCY DEPARTMENT Provider Note   CSN: [id number] Arrival date & time: 04/07/2019  1354     History   Chief Complaint Chief Complaint  Patient presents with  . Altered Mental Status    HPI Lucas Calderon is a 78 y.o. male.     The history is provided by medical records (eMS report to nursing). The history is limited by the condition of the patient.  Altered Mental Status Presenting symptoms: behavior changes and combativeness   Severity:  Moderate Most recent episode:  Today Episode history:  Single Timing:  Constant Progression:  Unable to specify Chronicity:  New Context: dementia   Associated symptoms: no abdominal pain, no fever, no light-headedness, no nausea and no vomiting   Associated symptoms comment:  Fatigue   Past Medical History:  Diagnosis Date  . Anemia   . Anorexia   . Arthritis   . Cancer ([place])    skin cancer - face  . CHF (congestive heart failure) ([place])   . Chronic kidney disease    dialysis [facility]  . Coronary artery disease   . Dementia ([place])   . Diabetes mellitus without complication ([place])    not taking any meds  . Dysphagia   . GERD (gastroesophageal reflux disease)   . Hypertension   . Pneumonia   . Shortness of breath     Patient Active Problem List   Diagnosis Date Noted  . COVID-19 virus detected 03/22/2019  . End stage renal disease ([place]) 11/03/2013  . HTN (hypertension) 09/26/2013  . Anemia in CKD (chronic kidney disease) 09/26/2013  . Diabetes mellitus, type 2 ([place]) 09/26/2013    Past Surgical History:  Procedure Laterality Date  . APPENDECTOMY    . AV FISTULA PLACEMENT Left 09/11/2013   Procedure:  CREATION- RADIOCEPHALIC arteriovenous fistula;  Surgeon: Conrad [place], MD;  Location: [facility];  Service: Vascular;  Laterality: Left;  . FISTULOGRAM Left 01/31/2014   Procedure: FISTULOGRAM;  Surgeon: Conrad McGill, MD;  Location: [facility];  Service: Vascular;  Laterality: Left;  . LIGATION OF  COMPETING BRANCHES OF ARTERIOVENOUS FISTULA Left 01/31/2014   Procedure: REVISION OF ARTERIOVENOUS FISTULA;  Surgeon: Conrad Estill, MD;  Location: [facility];  Service: Vascular;  Laterality: Left;        Home Medications    Prior to Admission medications   Medication Sig Start Date End Date Taking? Authorizing Provider  acetaminophen (TYLENOL) 325 MG tablet Take 650 mg by mouth every 6 (six) hours as needed for mild pain.    [provider]  B Complex-C-Folic Acid (VITA-BEE/C) TABS Take 1 tablet by mouth daily.    [provider]  calcium acetate (PHOSLO) 667 MG capsule Take 1,334 mg by mouth 3 (three) times daily.    [provider]  carvedilol (COREG) 6.25 MG tablet Take 6.25 mg by mouth See admin instructions. Take 6.25mg  in evening every day of the week. Take 6.25mg  in the morning only on non-dialysis days: Tuesday, Thursday, Saturday and Sunday.    [provider]  Cholecalciferol (VITAMIN D) 50 MCG (2000 UT) tablet Take 2,000 Units by mouth daily.    [provider]  famotidine (PEPCID) 20 MG tablet Take 20 mg by mouth 3 (three) times a week. Monday, Wed, Friday    [provider]  hydrocortisone (ANUSOL-HC) 25 MG suppository Place 1 suppository (25 mg total) rectally 2 (two) times daily as needed for hemorrhoids or anal itching. 03/24/19   Samuella Cota,  MD  isosorbide mononitrate (IMDUR) 60 MG 24 hr tablet Take 60 mg by mouth See admin instructions. Take 60mg  on Tuesdays, Thursdays, Saturdays and Sundays.    [provider]  mirtazapine (REMERON) 7.5 MG tablet Take 7.5 mg by mouth at bedtime.    [provider]  multivitamin (RENA-VIT) TABS tablet Take 1 tablet by mouth at bedtime. 11/22/13   Hongalgi, Lenis Dickinson, MD  RENVELA 800 MG tablet Take 2,400 mg by mouth 3 (three) times daily. 07/10/16   [provider]  Zinc Sulfate 220 (50 Zn) MG TABS Take 1 tablet (220 mg total) by mouth daily. 03/24/19   Samuella Cota, MD    Family History Family History  Problem Relation Age of Onset  . Diabetes Mother   . Heart disease Mother   . Hypertension Mother   . Diabetes Father   . Hypertension Father   . Peripheral vascular disease Father   . Hypertension Sister     Social History Social History   Tobacco Use  . Smoking status: Former Smoker    Quit date: 01/30/1994    Years since quitting: 25.1  . Smokeless tobacco: Never Used  Substance Use Topics  . Alcohol use: No  . Drug use: No     Allergies   Penicillins   Review of Systems Review of Systems  Unable to perform ROS: Dementia  Constitutional: Positive for chills and fatigue. Negative for fever.  Gastrointestinal: Negative for abdominal pain, nausea and vomiting.  Neurological: Negative for light-headedness.     Physical Exam Updated Vital Signs There were no vitals taken for this visit.  Physical Exam Vitals signs and nursing note reviewed.  Constitutional:      General: He is not in acute distress.    Appearance: He is not ill-appearing, toxic-appearing or diaphoretic.  HENT:     Head: Normocephalic.     Nose: No congestion or rhinorrhea.     Mouth/Throat:     Mouth: Mucous membranes are moist.     Pharynx: Oropharynx is clear. No oropharyngeal exudate or posterior oropharyngeal erythema.  Eyes:     Extraocular Movements: Extraocular movements intact.     Conjunctiva/sclera: Conjunctivae normal.     Pupils: Pupils are equal, round, and reactive to light.  Neck:     Musculoskeletal: No muscular tenderness.  Cardiovascular:     Rate and Rhythm: Normal rate and regular rhythm.     Pulses: Normal pulses.     Heart sounds: No murmur.  Pulmonary:     Effort: Pulmonary effort is normal.     Breath sounds: Rhonchi present. No wheezing or rales.  Chest:     Chest wall: No tenderness.  Abdominal:     General: Abdomen is flat.     Tenderness: There is no abdominal tenderness.  Musculoskeletal:        General: No  tenderness.     Right lower leg: No edema.     Left lower leg: No edema.  Skin:    Capillary Refill: Capillary refill takes less than 2 seconds.  Neurological:     Mental Status: He is alert.     GCS: GCS eye subscore is 4. GCS verbal subscore is 4. GCS motor subscore is 6.     Sensory: Sensation is intact.     Motor: Motor function is intact. No weakness.     Comments: Patient following commands but only answering 1-2 word answers to questions at times.  Inconsistent with answering.  Pupils symmetric and reactive with normal extraocular movements.  Patient only reports he is having fatigue and feeling cold.  Moving all extremities.  Reports sensation of extremities.      ED Treatments / Results  Labs (all labs ordered are listed, but only abnormal results are displayed) Labs Reviewed  CBC WITH DIFFERENTIAL/PLATELET - Abnormal; Notable for the following components:      Result Value   RBC 3.26 (*)    Hemoglobin 9.4 (*)    HCT 30.1 (*)    All other components within normal limits  COMPREHENSIVE METABOLIC PANEL - Abnormal; Notable for the following components:   CO2 21 (*)    Glucose, Bld 211 (*)    BUN 89 (*)    Creatinine, Ser 16.72 (*)    Calcium 8.2 (*)    Albumin 2.6 (*)    AST 48 (*)    GFR calc non Af Amer 2 (*)    GFR calc Af Amer 3 (*)    Anion gap 22 (*)    All other components within normal limits  LACTIC ACID, PLASMA - Abnormal; Notable for the following components:   Lactic Acid, Venous 2.8 (*)    All other components within normal limits  LIPASE, BLOOD - Abnormal; Notable for the following components:   Lipase 81 (*)    All other components within normal limits  MAGNESIUM - Abnormal; Notable for the following components:   Magnesium 2.7 (*)    All other components within normal limits  TROPONIN I (HIGH SENSITIVITY) - Abnormal; Notable for the following components:   Troponin I (High Sensitivity) 94 (*)    All other components within normal limits  CULTURE,  BLOOD (ROUTINE X 2)  CULTURE, BLOOD (ROUTINE X 2)  AMMONIA  LACTIC ACID, PLASMA  TSH    EKG EKG Interpretation  Date/Time:  Tuesday March 28 2019 14:31:49 EDT Ventricular Rate:  91 PR Interval:    QRS Duration: 110 QT Interval:  360 QTC Calculation: 443 R Axis:   53 Text Interpretation:  Sinus rhythm RSR' in V1 or V2, right VCD or RVH Artifact in lead(s) I II III aVR aVL aVF V1 V2 V5 When compared to prior, more artifact.  No STEMI Confirmed by Antony Blackbird [phone]) on 03/22/2019 2:50:32 PM   Radiology Dg Chest Portable 1 View  Result Date: 04/15/2019 CLINICAL DATA:  Acute onset of chest pain and shortness of breath. Patient is COVID-19 positive. EXAM: PORTABLE CHEST 1 VIEW COMPARISON:  03/22/2019 and earlier. FINDINGS: Cardiac silhouette normal in size, unchanged. Thoracic aorta markedly atherosclerotic, unchanged. Interval development of ground-glass airspace consolidation in the RIGHT UPPER LOBE since the examination 6 days ago. No new lung parenchymal findings elsewhere in either lung. Normal pulmonary vascularity. No visible pleural effusions. IMPRESSION: Acute RIGHT upper lobe pneumonia, likely viral. Electronically Signed   By: Evangeline Dakin M.D.   On: 03/27/2019 14:37    Procedures Procedures (including critical care time)  Medications Ordered in ED Medications  aztreonam (AZACTAM) 2 g in sodium chloride 0.9 % 100 mL IVPB (has no administration in time range)  vancomycin (VANCOCIN) 1,500 mg in sodium chloride 0.9 % 500 mL IVPB (has no administration in time range)     Initial Impression / Assessment and Plan / ED Course  I have reviewed the triage vital signs and the nursing notes.  Pertinent labs & imaging results that were available during my care of the patient were reviewed by me and considered in my medical decision  making (see chart for details).        Lucas Calderon is a 78 y.o. male with past medical history significant for CAD, diabetes, hypertension,  ESRD on dialysis TTS, GERD, dementia, CHF, and recent admission for COVID-19 who presents for altered mental status and fatigue.  According to EMS report, patient went to a new dialysis center today and began being agitated and spitting at staff.  He was unable to get dialysis at that time due to fatigue and altered mental status and the aggressive behavior.  Patient was sent to the emergency department evaluation.  On my evaluation, patient is resting comfortably and is not appearing in distress.  He is however complaining of severe fatigue and feels warm to the touch.  He had rhonchi on exam.  Chest and abdomen nontender.  Patient only saying 1 or 2 word answers occasionally.  He is moving all extremities.  Documentation shows that patient appears to have a baseline of 1-2 word answers at times.  Patient will have work-up for altered mental status including that for bacterial infection.  He reports he does not make any urine.  Chest x-ray to be obtained as well as labs to see if he needs emergent dialysis today as he missed his treatment today.  2:57 PM Chest x-ray just returned showing pneumonia and appears likely viral on the read.  Will hold on antibiotics initially.  Suspect his development of pneumonia may contribute to his worsened mental status and aggressive behavior of the dialysis center.  Will wait to see if needs emergent dialysis however given the altered mental status and development of new pneumonia, I now anticipate admission.   Final Clinical Impressions(s) / ED Diagnoses   Final diagnoses:  Altered mental status, unspecified altered mental status type  COVID-19     Clinical Impression: 1. Altered mental status, unspecified altered mental status type   2. COVID-19     Disposition: Care transferred to oncoming team while awaiting reassessment after diagnostic work-up.  Anticipate admission for altered mental status in the setting of COVID-19 and possible pneumonia.  This  note was prepared with assistance of Systems analyst. Occasional wrong-word or sound-a-like substitutions may have occurred due to the inherent limitations of voice recognition software.      Audie Wieser, Gwenyth Allegra, MD 04/11/2019 1538

## 2019-03-29 ENCOUNTER — Encounter (HOSPITAL_COMMUNITY): Payer: Self-pay | Admitting: Internal Medicine

## 2019-03-29 DIAGNOSIS — J189 Pneumonia, unspecified organism: Secondary | ICD-10-CM

## 2019-03-29 DIAGNOSIS — Z992 Dependence on renal dialysis: Secondary | ICD-10-CM

## 2019-03-29 DIAGNOSIS — U071 COVID-19: Secondary | ICD-10-CM | POA: Diagnosis present

## 2019-03-29 DIAGNOSIS — E119 Type 2 diabetes mellitus without complications: Secondary | ICD-10-CM

## 2019-03-29 DIAGNOSIS — L8990 Pressure ulcer of unspecified site, unspecified stage: Secondary | ICD-10-CM | POA: Insufficient documentation

## 2019-03-29 DIAGNOSIS — D631 Anemia in chronic kidney disease: Secondary | ICD-10-CM

## 2019-03-29 LAB — BLOOD CULTURE ID PANEL (REFLEXED)

## 2019-03-29 LAB — COMPREHENSIVE METABOLIC PANEL
ALT: 25 U/L (ref 0–44)
AST: 42 U/L — ABNORMAL HIGH (ref 15–41)
Albumin: 2.3 g/dL — ABNORMAL LOW (ref 3.5–5.0)
Alkaline Phosphatase: 46 U/L (ref 38–126)
Anion gap: 23 — ABNORMAL HIGH (ref 5–15)
BUN: 98 mg/dL — ABNORMAL HIGH (ref 8–23)
CO2: 17 mmol/L — ABNORMAL LOW (ref 22–32)
Calcium: 7.7 mg/dL — ABNORMAL LOW (ref 8.9–10.3)
Chloride: 103 mmol/L (ref 98–111)
Creatinine, Ser: 17.1 mg/dL — ABNORMAL HIGH (ref 0.61–1.24)
GFR calc Af Amer: 3 mL/min — ABNORMAL LOW (ref >60)
GFR calc non Af Amer: 2 mL/min — ABNORMAL LOW (ref >60)
Glucose, Bld: 168 mg/dL — ABNORMAL HIGH (ref 70–99)
Potassium: 4.8 mmol/L (ref 3.5–5.1)
Sodium: 143 mmol/L (ref 135–145)
Total Bilirubin: 0.9 mg/dL (ref 0.3–1.2)
Total Protein: 7.1 g/dL (ref 6.5–8.1)

## 2019-03-29 LAB — CBC
HCT: 27.2 % — ABNORMAL LOW (ref 39.0–52.0)
Hemoglobin: 8.8 g/dL — ABNORMAL LOW (ref 13.0–17.0)
MCH: 29.1 pg (ref 26.0–34.0)
MCHC: 32.4 g/dL (ref 30.0–36.0)
MCV: 90.1 fL (ref 80.0–100.0)
Platelets: 174 10*3/uL (ref 150–400)
RBC: 3.02 MIL/uL — ABNORMAL LOW (ref 4.22–5.81)
RDW: 14.2 % (ref 11.5–15.5)
WBC: 6.8 10*3/uL (ref 4.0–10.5)
nRBC: 0 % (ref 0.0–0.2)

## 2019-03-29 LAB — GLUCOSE, CAPILLARY
Glucose-Capillary: 161 mg/dL — ABNORMAL HIGH (ref 70–99)
Glucose-Capillary: 171 mg/dL — ABNORMAL HIGH (ref 70–99)
Glucose-Capillary: 190 mg/dL — ABNORMAL HIGH (ref 70–99)
Glucose-Capillary: 164 mg/dL — ABNORMAL HIGH (ref 70–99)

## 2019-03-29 MED ORDER — PANTOPRAZOLE SODIUM 40 MG IV SOLR
40.0000 mg | INTRAVENOUS | Status: DC
  Administered 2019-03-29 – 2019-04-01 (×4): 40 mg via INTRAVENOUS
  Filled 2019-03-29 (×4): qty 40

## 2019-03-29 MED ORDER — VANCOMYCIN HCL IN DEXTROSE 750-5 MG/150ML-% IV SOLN: 750.0000 mg | INTRAVENOUS | Status: DC

## 2019-03-29 MED ORDER — SODIUM CHLORIDE 0.9 % IV SOLN
1.0000 g | INTRAVENOUS | Status: DC
  Administered 2019-03-29 – 2019-04-02 (×5): 1 g via INTRAVENOUS
  Filled 2019-03-29 (×5): qty 1

## 2019-03-29 MED ORDER — CHLORHEXIDINE GLUCONATE CLOTH 2 % EX PADS
6.0000 | MEDICATED_PAD | Freq: Every day | CUTANEOUS | Status: DC
  Administered 2019-03-30 – 2019-04-02 (×4): 6 via TOPICAL

## 2019-03-29 MED ORDER — VANCOMYCIN HCL IN DEXTROSE 750-5 MG/150ML-% IV SOLN
750.0000 mg | Freq: Once | INTRAVENOUS | Status: AC
  Administered 2019-03-30: 750 mg via INTRAVENOUS
  Filled 2019-03-29: qty 150

## 2019-03-29 MED ORDER — DEXAMETHASONE SODIUM PHOSPHATE 10 MG/ML IJ SOLN
6.0000 mg | INTRAMUSCULAR | Status: DC
  Administered 2019-03-29 – 2019-03-30 (×2): 6 mg via INTRAVENOUS
  Filled 2019-03-29 (×2): qty 1

## 2019-03-29 NOTE — Progress Notes (Signed)
Notified Dr Cathlean Sauer of pt not eating or taking PO medications.

## 2019-03-29 NOTE — Progress Notes (Signed)
PHARMACY - PHYSICIAN COMMUNICATION CRITICAL VALUE ALERT - BLOOD CULTURE IDENTIFICATION (BCID)  Lucas Calderon is an 78 y.o. male who presented to [facility] on 03/25/2019 with a chief complaint of altered mental status  Assessment:  HD patient, high risk for bacteremia   Name of physician (or Provider) Contacted: K Schorr ([facility])  Current antibiotics: Ceftriaxone   Changes to prescribed antibiotics recommended:  Re-start vancomycin  Cont Ceftriaxone   Results for orders placed or performed during the hospital encounter of 04/01/2019  Blood Culture ID Panel (Reflexed) (Collected: 04/05/2019  2:33 PM)  Result Value Ref Range   Enterococcus species NOT DETECTED NOT DETECTED   Listeria monocytogenes NOT DETECTED NOT DETECTED   Staphylococcus species DETECTED (A) NOT DETECTED   Staphylococcus aureus (BCID) NOT DETECTED NOT DETECTED   Methicillin resistance DETECTED (A) NOT DETECTED   Streptococcus species NOT DETECTED NOT DETECTED   Streptococcus agalactiae NOT DETECTED NOT DETECTED   Streptococcus pneumoniae NOT DETECTED NOT DETECTED   Streptococcus pyogenes NOT DETECTED NOT DETECTED   Acinetobacter baumannii NOT DETECTED NOT DETECTED   Enterobacteriaceae species NOT DETECTED NOT DETECTED   Enterobacter cloacae complex NOT DETECTED NOT DETECTED   Escherichia coli NOT DETECTED NOT DETECTED   Klebsiella oxytoca NOT DETECTED NOT DETECTED   Klebsiella pneumoniae NOT DETECTED NOT DETECTED   Proteus species NOT DETECTED NOT DETECTED   Serratia marcescens NOT DETECTED NOT DETECTED   Haemophilus influenzae NOT DETECTED NOT DETECTED   Neisseria meningitidis NOT DETECTED NOT DETECTED   Pseudomonas aeruginosa NOT DETECTED NOT DETECTED   Candida albicans NOT DETECTED NOT DETECTED   Candida glabrata NOT DETECTED NOT DETECTED   Candida krusei NOT DETECTED NOT DETECTED   Candida parapsilosis NOT DETECTED NOT DETECTED   Candida tropicalis NOT DETECTED NOT DETECTED    Narda Bonds 03/29/2019  11:39  PM

## 2019-03-29 NOTE — Consult Note (Signed)
   [facility] CM Inpatient Consult   03/29/2019  Lucas Calderon Nov 15, 1940 [id number]  Patient screened for high risk score of 22% for unplanned readmission score  and for readmission hospitalization in less than 7 days in the Medicare ACO.   Review of patient's medical record reveals patient is from MD history and Physical notes as follows:  [name]  is a 78 y.o. male, w dementia, hypertension, Dm2, CAD, CHF (diastolic), ESRD on HD MWF, Anemia, recent admission for Covid-19 infection, apparently presents with altered mental status from [facility].   Primary Care Provider is:  Maryella Shivers, MD  team   Plan:  Follow up for progress and disposition.  If patient returns to a [facility] affiliated facility will alert [facility] Nurse.    Please place a [facility] Care Management consult as appropriate and for questions contact:   Natividad Brood, RN BSN [facility] Liaison  [phone] business mobile phone Toll free office [phone]  Fax number: [phone]

## 2019-03-29 NOTE — Plan of Care (Signed)

## 2019-03-29 NOTE — H&P (Addendum)
TRH H&P    Patient Demographics:    Lucas Calderon, is a 78 y.o. male  MRN: [id number]  DOB - Oct 06, 1940  Admit Date - 04/12/2019  Referring MD/NP/PA:   Aletta Edouard  Outpatient Primary MD for the patient is Maryella Shivers, MD  Patient coming from:  [facility]  Chief complaint-  Altered mental status   HPI:    Lucas Calderon  is a 78 y.o. male, w dementia, hypertension, Dm2, CAD, CHF (diastolic), ESRD on HD MWF, Anemia, recent admission for Covid-19 infection, apparently presents with altered mental status from [facility].    In ED,  T 99.2, P 96 R 16, Bp 119/70  Pox 95% on RA Wt 64kg  CT brain IMPRESSION: 1. Motion degraded examination demonstrates no acute intracranial abnormality. 2. Stable moderate generalized atrophy and mild chronic microvascular ischemic changes of the white matter.  CXR IMPRESSION: Acute RIGHT upper lobe pneumonia, likely viral.  Lactic acid 2.8  -> 1.7  Blood culture x2 pending  Wbc 8.4, Hgb 9.4, Plt 186  Na 144, K 5.0, Bun 89, Creatinine 16.72 Alb 2.6 Ast 48, Alt 28 Alk phos 45, T. Bili 0.9 Lipase 81 Magnesium 2.7 TSH 0.646  Trop 94 -> 85   EKG nsr at 90, nl axis, nl int, no st-t changes c/w ischemia  Pt will be admitted for AMS secondary to pneumonia, covid -19, and ARF   Review of systems:    In addition to the HPI above, unable to obtain clearly due to dementia  No Fever-chills, No Headache, No changes with Vision or hearing, No problems swallowing food or Liquids, No Chest pain, Cough or Shortness of Breath, No Abdominal pain, No Nausea or Vomiting, bowel movements are regular, No Blood in stool or Urine, No dysuria, No new skin rashes or bruises, No new joints pains-aches,  No new weakness, tingling, numbness in any extremity, No recent weight gain or loss, No polyuria, polydypsia or polyphagia, No significant Mental  Stressors.  All other systems reviewed and are negative.    Past History of the following :    Past Medical History:  Diagnosis Date   Anemia    Anorexia    Arthritis    Cancer ([place])    skin cancer - face   CHF (congestive heart failure) (HCC)    Chronic kidney disease    dialysis T-TH-Sa   Coronary artery disease    Dementia (HCC)    Diabetes mellitus without complication ([place])    not taking any meds   Dysphagia    GERD (gastroesophageal reflux disease)    Hypertension    Pneumonia    Shortness of breath       Past Surgical History:  Procedure Laterality Date   APPENDECTOMY     AV FISTULA PLACEMENT Left 09/11/2013   Procedure:  CREATION- RADIOCEPHALIC arteriovenous fistula;  Surgeon: Conrad [place], MD;  Location: [facility];  Service: Vascular;  Laterality: Left;   FISTULOGRAM Left 01/31/2014   Procedure: FISTULOGRAM;  Surgeon: Conrad [place]alvin [place], MD;  Location:  [facility] OR;  Service: Vascular;  Laterality: Left;   LIGATION OF COMPETING BRANCHES OF ARTERIOVENOUS FISTULA Left 01/31/2014   Procedure: REVISION OF ARTERIOVENOUS FISTULA;  Surgeon: Conrad Hominy, MD;  Location[facility];  Service: Vascular;  Laterality: Left;      Social History:      Social History   Tobacco Use   Smoking status: Former Smoker    Quit date: 01/30/1994    Years since quitting: 25.1   Smokeless tobacco: Never Used  Substance Use Topics   Alcohol use: No       Family History :     Family History  Problem Relation Age of Onset   Diabetes Mother    Heart disease Mother    Hypertension Mother    Diabetes Father    Hypertension Father    Peripheral vascular disease Father    Hypertension Sister        Home Medications:   Prior to Admission medications   Medication Sig Start Date End Date Taking? Authorizing Provider  acetaminophen (TYLENOL) 325 MG tablet Take 650 mg by mouth every 6 (six) hours as needed for mild pain.    [provider]  B Complex-C-Folic  Acid (VITA-BEE/C) TABS Take 1 tablet by mouth daily.    [provider]  calcium acetate (PHOSLO) 667 MG capsule Take 1,334 mg by mouth 3 (three) times daily.    [provider]  carvedilol (COREG) 6.25 MG tablet Take 6.25 mg by mouth See admin instructions. Take 6.5m in evening every day of the week. Take 6.266min the morning only on non-dialysis days: Tuesday, Thursday, Saturday and Sunday.    [provider]  Cholecalciferol (VITAMIN D) 50 MCG (2000 UT) tablet Take 2,000 Units by mouth daily.    [provider]  famotidine (PEPCID) 20 MG tablet Take 20 mg by mouth 3 (three) times a week. Monday, Wed, Friday    [provider]  hydrocortisone (ANUSOL-HC) 25 MG suppository Place 1 suppository (25 mg total) rectally 2 (two) times daily as needed for hemorrhoids or anal itching. 03/24/19   GoSamuella CotaMD  isosorbide mononitrate (IMDUR) 60 MG 24 hr tablet Take 60 mg by mouth See admin instructions. Take 6012mn Tuesdays, Thursdays, Saturdays and Sundays.    [provider]  mirtazapine (REMERON) 7.5 MG tablet Take 7.5 mg by mouth at bedtime.    [provider]  multivitamin (RENA-VIT) TABS tablet Take 1 tablet by mouth at bedtime. 11/22/13   Hongalgi, AnaLenis DickinsonD  RENVELA 800 MG tablet Take 2,400 mg by mouth 3 (three) times daily. 07/10/16   [provider]  Zinc Sulfate 220 (50 Zn) MG TABS Take 1 tablet (220 mg total) by mouth daily. 03/24/19   GooSamuella CotaD     Allergies:     Allergies  Allergen Reactions   Penicillins Other (See Comments)    [facility]   Physical Exam:   Vitals  Blood pressure 137/72, pulse 97, temperature 98.9 F (37.2 C), temperature source Axillary, resp. rate 16, height 5' 8" (1.727 m), weight 59.5 kg, SpO2 100 %.  1.  General:  axox1  2. Psychiatric: Euthymic   3. Neurologic: nonfocal  4. HEENMT:  Anicteric, pupils 1.5mm10mmmetric, direct, consensual  intact Neck: no jvd  5. Respiratory : Slight crackles at left lung base, and right lung field, no wheezing  6. Cardiovascular : rrr s1, s2,   7. Gastrointestinal:  Abd:  soft, nt, nd, +bs  8. Skin:  Ext: no c/c/e,  No rash  9.Musculoskeletal:  Good ROM,      Data Review:    CBC Recent Labs  Lab 03/22/19 1530 03/23/19 0651 03/23/19 1221 03/24/19 0412 04/16/2019 1433  WBC 5.7 6.0 5.6 5.6 8.4  HGB 8.8* 9.5* 10.0* 9.7* 9.4*  HCT 26.6* 29.0* 29.8* 29.1* 30.1*  PLT 115* 143* 143* 144* 186  MCV 89.6 89.0 87.9 89.5 92.3  MCH 29.6 29.1 29.5 29.8 28.8  MCHC 33.1 32.8 33.6 33.3 31.2  RDW 13.5 13.4 13.4 13.5 13.9  LYMPHSABS 1.4  --   --   --  0.9  MONOABS 0.8  --   --   --  0.5  EOSABS 0.0  --   --   --  0.1  BASOSABS 0.0  --   --   --  0.0   ------------------------------------------------------------------------------------------------------------------  Results for orders placed or performed during the hospital encounter of 03/21/2019 (from the past 48 hour(s))  CBC with Differential     Status: Abnormal   Collection Time: 04/05/2019  2:33 PM  Result Value Ref Range   WBC 8.4 4.0 - 10.5 K/uL   RBC 3.26 (L) 4.22 - 5.81 MIL/uL   Hemoglobin 9.4 (L) 13.0 - 17.0 g/dL   HCT 30.1 (L) 39.0 - 52.0 %   MCV 92.3 80.0 - 100.0 fL   MCH 28.8 26.0 - 34.0 pg   MCHC 31.2 30.0 - 36.0 g/dL   RDW 13.9 11.5 - 15.5 %   Platelets 186 150 - 400 K/uL   nRBC 0.0 0.0 - 0.2 %   Neutrophils Relative % 81 %   Neutro Abs 6.8 1.7 - 7.7 K/uL   Lymphocytes Relative 11 %   Lymphs Abs 0.9 0.7 - 4.0 K/uL   Monocytes Relative 6 %   Monocytes Absolute 0.5 0.1 - 1.0 K/uL   Eosinophils Relative 1 %   Eosinophils Absolute 0.1 0.0 - 0.5 K/uL   Basophils Relative 0 %   Basophils Absolute 0.0 0.0 - 0.1 K/uL   Immature Granulocytes 1 %   Abs Immature Granulocytes 0.07 0.00 - 0.07 K/uL    Comment: Performed at [facility], [address], [place], [place] [address]  Comprehensive metabolic panel      Status: Abnormal   Collection Time: 03/31/2019  2:33 PM  Result Value Ref Range   Sodium 144 135 - 145 mmol/L   Potassium 5.0 3.5 - 5.1 mmol/L   Chloride 101 98 - 111 mmol/L   CO2 21 (L) 22 - 32 mmol/L   Glucose, Bld 211 (H) 70 - 99 mg/dL   BUN 89 (H) 8 - 23 mg/dL   Creatinine, Ser 16.72 (H) 0.61 - 1.24 mg/dL   Calcium 8.2 (L) 8.9 - 10.3 mg/dL   Total Protein 7.4 6.5 - 8.1 g/dL   Albumin 2.6 (L) 3.5 - 5.0 g/dL   AST 48 (H) 15 - 41 U/L   ALT 28 0 - 44 U/L   Alkaline Phosphatase 45 38 - 126 U/L   Total Bilirubin 0.9 0.3 - 1.2 mg/dL   GFR calc non Af Amer 2 (L) >60 mL/min   GFR calc Af Amer 3 (L) >60 mL/min   Anion gap 22 (H) 5 - 15    Comment: Performed at [facility], [place] [address], [place], [place] [address]  Lactic acid, plasma     Status: Abnormal   Collection Time: 04/06/2019  2:33  PM  Result Value Ref Range   Lactic Acid, Venous 2.8 (HH) 0.5 - 1.9 mmol/L    Comment: CRITICAL RESULT CALLED TO, READ BACK BY AND VERIFIED WITH: Lora Havens RN AT 1520 ON [id number] BY Marcos Eke Performed at [facility], [place] [address], [place], [place] [address]   Lipase, blood     Status: Abnormal   Collection Time: 04/06/2019  2:33 PM  Result Value Ref Range   Lipase 81 (H) 11 - 51 U/L    Comment: Performed at [facility] [address] [place], [place] [address]  Troponin I (High Sensitivity)     Status: Abnormal   Collection Time: 04/12/2019  2:33 PM  Result Value Ref Range   Troponin I (High Sensitivity) 94 (H) <18 ng/L    Comment: (NOTE) Elevated high sensitivity troponin I (hsTnI) values and significant  changes across serial measurements may suggest ACS but many other  chronic and acute conditions are known to elevate hsTnI results.  Refer to the Links section for chest pain algorithms and additional  guidance. Performed at [facility], [place] [address], [place], [place] [address]   Magnesium     Status: Abnormal   Collection Time: 03/25/2019  2:33 PM  Result  Value Ref Range   Magnesium 2.7 (H) 1.7 - 2.4 mg/dL    Comment: Performed at [facility] [address], [place], [place] [address]  TSH     Status: None   Collection Time: 04/01/2019  2:33 PM  Result Value Ref Range   TSH 0.646 0.350 - 4.500 uIU/mL    Comment: Performed by a 3rd Generation assay with a functional sensitivity of <=0.01 uIU/mL. Performed at [facility], [place] [address] [place], [place] [address]   Ammonia     Status: None   Collection Time: 04/04/2019  2:33 PM  Result Value Ref Range   Ammonia 24 9 - 35 umol/L    Comment: Performed at [facility], [place] [address], [place], [place] [address]  Lactic acid, plasma     Status: None   Collection Time: 04/04/2019  4:20 PM  Result Value Ref Range   Lactic Acid, Venous 1.7 0.5 - 1.9 mmol/L    Comment: Performed at [facility] [address] [place], [place] [address]  Troponin I (High Sensitivity)     Status: Abnormal   Collection Time: 04/10/2019  4:39 PM  Result Value Ref Range   Troponin I (High Sensitivity) 85 (H) <18 ng/L    Comment: (NOTE) Elevated high sensitivity troponin I (hsTnI) values and significant  changes across serial measurements may suggest ACS but many other  chronic and acute conditions are known to elevate hsTnI results.  Refer to the "Links" section for chest pain algorithms and additional  guidance. Performed at [facility], [place] [address], [place], [place] [address]   SARS Coronavirus 2 [facility] order, Performed in [facility] hospital lab) Nasopharyngeal Nasopharyngeal Swab     Status: Abnormal   Collection Time: 03/23/2019  6:55 PM   Specimen: Nasopharyngeal Swab  Result Value Ref Range   SARS Coronavirus 2 POSITIVE (A) NEGATIVE    Comment: RESULT CALLED TO, READ BACK BY AND VERIFIED WITH: Ivin Poot RN [id number] 03/22/2019 A BROWNING (NOTE) If result is NEGATIVE SARS-CoV-2 target nucleic acids are NOT DETECTED. The SARS-CoV-2 RNA is generally detectable in upper and lower   respiratory specimens during the acute phase  of infection. The lowest  concentration of SARS-CoV-2 viral copies this assay can detect is 250  copies / mL. A negative result does not preclude SARS-CoV-2 infection  and should not be used as the sole basis for treatment or other  patient management decisions.  A negative result may occur with  improper specimen collection / handling, submission of specimen other  than nasopharyngeal swab, presence of viral mutation(s) within the  areas targeted by this assay, and inadequate number of viral copies  (<250 copies / mL). A negative result must be combined with clinical  observations, patient history, and epidemiological information. If result is POSITIVE SARS-CoV-2 target nucleic acids are DETECTED. Th e SARS-CoV-2 RNA is generally detectable in upper and lower  respiratory specimens during the acute phase of infection.  Positive  results are indicative of active infection with SARS-CoV-2.  Clinical  correlation with patient history and other diagnostic information is  necessary to determine patient infection status.  Positive results do  not rule out bacterial infection or co-infection with other viruses. If result is PRESUMPTIVE POSTIVE SARS-CoV-2 nucleic acids MAY BE PRESENT.   A presumptive positive result was obtained on the submitted specimen  and confirmed on repeat testing.  While 2019 novel coronavirus  (SARS-CoV-2) nucleic acids may be present in the submitted sample  additional confirmatory testing may be necessary for epidemiological  and / or clinical management purposes  to differentiate between  SARS-CoV-2 and other Sarbecovirus currently known to infect humans.  If clinically indicated additional testing with an alternate test  methodology [phone]) is  advised. The SARS-CoV-2 RNA is generally  detectable in upper and lower respiratory specimens during the acute  phase of infection. The expected result is Negative. Fact  Sheet for Patients:  StrictlyIdeas.no Fact Sheet for Healthcare Providers: BankingDealers.co.za This test is not yet approved or cleared by the [place] FDA and has been authorized for detection and/or diagnosis of SARS-CoV-2 by FDA under an Emergency Use Authorization (EUA).  This EUA will remain in effect (meaning this test can be used) for the duration of the COVID-19 declaration under Section 564(b)(1) of the Act, 21 U.S.C. section 360bbb-3(b)(1), unless the authorization is terminated or revoked sooner. Performed at [facility], [place] [address] [place], [place] [address]     Chemistries  Recent Labs  Lab 03/22/19 [phone] 03/22/19 1740 03/23/19 0651 03/24/19 0412 04/10/2019 1433  NA 143  --  136 139 144  K 3.8  --  3.5 3.6 5.0  CL 98  --  96* 98 101  CO2 25  --  24 25 21*  GLUCOSE 86  --  68* 115* 211*  BUN 59*  --  28* 46* 89*  CREATININE 9.83*  --  5.93* 8.48* 16.72*  CALCIUM 9.1  --  7.9* 8.4* 8.2*  MG  --  2.6*  --   --  2.7*  AST 34  --   --   --  48*  ALT 23  --   --   --  28  ALKPHOS 46  --   --   --  45  BILITOT 0.8  --   --   --  0.9   ------------------------------------------------------------------------------------------------------------------  ------------------------------------------------------------------------------------------------------------------ GFR: Estimated Creatinine Clearance: 3.2 mL/min (A) (by C-G formula based on SCr of 16.72 mg/dL (H)). Liver Function Tests: Recent Labs  Lab 03/22/19 1530 04/19/2019 1433  AST 34 48*  ALT 23 28  ALKPHOS 46 45  BILITOT 0.8 0.9  PROT 7.2  7.4  ALBUMIN 2.7* 2.6*   Recent Labs  Lab 04/17/2019 1433  LIPASE 81*   Recent Labs  Lab 04/17/2019 1433  AMMONIA 24   Coagulation Profile: No results for input(s): INR, PROTIME in the last 168 hours. Cardiac Enzymes: No results for input(s): CKTOTAL, CKMB, CKMBINDEX, TROPONINI in the last 168  hours. BNP (last 3 results) No results for input(s): PROBNP in the last 8760 hours. HbA1C: No results for input(s): HGBA1C in the last 72 hours. CBG: Recent Labs  Lab 03/24/19 1738 03/24/19 2036 03/25/19 0819 03/25/19 1057 03/25/19 1419  GLUCAP 82 122* 133* 98 135*   Lipid Profile: No results for input(s): CHOL, HDL, LDLCALC, TRIG, CHOLHDL, LDLDIRECT in the last 72 hours. Thyroid Function Tests: Recent Labs    04/18/2019 1433  TSH 0.646   Anemia Panel: No results for input(s): VITAMINB12, FOLATE, FERRITIN, TIBC, IRON, RETICCTPCT in the last 72 hours.  --------------------------------------------------------------------------------------------------------------- Urine analysis:    Component Value Date/Time   COLORURINE YELLOW 11/17/2013 1642   APPEARANCEUR CLEAR 11/17/2013 1642   LABSPEC 1.014 11/17/2013 1642   PHURINE 5.5 11/17/2013 1642   GLUCOSEU NEGATIVE 11/17/2013 1642   HGBUR TRACE (A) 11/17/2013 1642   BILIRUBINUR NEGATIVE 11/17/2013 1642   KETONESUR NEGATIVE 11/17/2013 1642   PROTEINUR 100 (A) 11/17/2013 1642   UROBILINOGEN 0.2 11/17/2013 1642   NITRITE NEGATIVE 11/17/2013 1642   LEUKOCYTESUR NEGATIVE 11/17/2013 1642      Imaging Results:    Ct Head Wo Contrast  Result Date: 04/06/2019 CLINICAL DATA:  78 year old who is COVID-19 positive, presenting with acute mental status changes. Current history of end-stage renal disease on hemodialysis. EXAM: CT HEAD WITHOUT CONTRAST TECHNIQUE: Contiguous axial images were obtained from the base of the skull through the vertex without intravenous contrast. COMPARISON:  08/07/2016 and earlier. FINDINGS: Patient motion blurred many of the images. Brain: Moderate cortical and deep atrophy, unchanged. Mild changes of small vessel disease of the white matter diffusely, unchanged. No mass lesion. No midline shift. No acute hemorrhage or hematoma. No extra-axial fluid collections. No evidence of acute infarction. No interval change.  Vascular: Severe BILATERAL carotid siphon atherosclerosis. No hyperdense vessel. Skull: Skull base suboptimally imaged due to patient motion. Allowing for this, no focal osseous abnormalities involving the skull. Sinuses/Orbits: Visualized paranasal sinuses well aerated. BILATERAL mastoid air cells and BILATERAL middle ear cavities also appear well aerated, though are blurred by patient motion. Orbits and globes not optimally imaged due to patient motion. For Other: None. IMPRESSION: 1. Motion degraded examination demonstrates no acute intracranial abnormality. 2. Stable moderate generalized atrophy and mild chronic microvascular ischemic changes of the white matter. Electronically Signed   By: Evangeline Dakin M.D.   On: 04/05/2019 17:48   Dg Chest Portable 1 View  Result Date: 04/17/2019 CLINICAL DATA:  Acute onset of chest pain and shortness of breath. Patient is COVID-19 positive. EXAM: PORTABLE CHEST 1 VIEW COMPARISON:  03/22/2019 and earlier. FINDINGS: Cardiac silhouette normal in size, unchanged. Thoracic aorta markedly atherosclerotic, unchanged. Interval development of ground-glass airspace consolidation in the RIGHT UPPER LOBE since the examination 6 days ago. No new lung parenchymal findings elsewhere in either lung. Normal pulmonary vascularity. No visible pleural effusions. IMPRESSION: Acute RIGHT upper lobe pneumonia, likely viral. Electronically Signed   By: Evangeline Dakin M.D.   On: 03/29/2019 14:37       Assessment & Plan:    Principal Problem:   COVID-19 Active Problems:   HTN (hypertension)   Anemia in CKD (chronic kidney disease)  Diabetes mellitus, type 2 ([place])   End stage renal disease ([place])   HCAP (healthcare-associated pneumonia)  AMS ? Unclear when last dialysis was Attempted to contact [facility] but they were not responding ddx worsening creatinine, covid-19 , hcap, worsening dementia Monitor  HCAP/ covid-19 infection Check esr, crp, cpk, d dimer, Ldh, IL-6 Blood  culture x2 Urine legionella antigen Urine strep antigen vanco iv, aztreonam iv pharmacy to dose Dexamethasone 1m iv qday  ESRD on HD MWF Cont Phoslo Cont Renvela Cont Renavit Please consult nephrology in AM to obtain dialysis for patient Check cmp in am  Anemia Check cbc in am  Severe protein calorie malnutrition (albumin =2.6) prostat 30 mL po bid  Hypertension Cont Carvedilol 6.227mpo bid Cont Imdur 6043mo qday  Dm2 fsbs ac and qhs, ISS  Gerd Cont Pepcid    DVT Prophylaxis-   Heparin  SCDs   AM Labs Ordered, also please review Full Orders  Family Communication: Admission, patients condition and plan of care including tests being ordered have been discussed with the patient and sister who indicate understanding and agree with the plan and Code Status.  Code Status:  FULL CODE, per sister, pt unable to explain to me his wishes due to AMS and dementia.  Pt's sister says that family is going to have a meeting tomorrow regarding his code status.  Sister aware that patient is on 2W room 35.36(spoke with her by phone)  Admission status: Inpatient: Based on patients clinical presentation and evaluation of above clinical data, I have made determination that patient meets Inpatient criteria at this time.  Pt will require iv abx, as well as iv dexamethasone for Covid-19 pneumonia vs Hcap.  Pt has high risk of clinical deterioration due to his comorbidities. Pt will require > 2nite stay and inpatient admission   Time spent in minutes : 70 minutes   JamJani GravelD on 03/29/2019 at 12:48 AM

## 2019-03-29 NOTE — Progress Notes (Signed)
PROGRESS NOTE    Lucas Calderon  HWE:[id number] DOB: March 10, 1941 DOA: 03/24/2019 PCP: Maryella Shivers, MD    Brief Narrative:  78 year old male who presented with altered mental status, transition nursing facility.  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  He was noted to have worsening mental status at the nursing facility and he was sent to the hospital.  He had a recent hospitalization for COVID-19 infection (03/22/19 to 03/25/19).  On his initial physical examination his temperature was 99.2, heart rate 96, respiratory 16, blood pressure 119/70, oxygenation 95% on room air.  His lungs had rales at the left lung base, right lung field, no wheezing, heart S1-S2 present rhythm, abdomen soft, no lower extremity edema.  SARS COVID-19 was positive.  Chest x-ray had a new right upper lobe infiltrate.  Patient was admitted to the hospital working diagnosis of acute hypoxic respiratory failure due to SARS COVID-19 pneumonia.   Assessment & Plan:   Principal Problem:   COVID-19 Active Problems:   HTN (hypertension)   Anemia in CKD (chronic kidney disease)   Diabetes mellitus, type 2 (HCC)   End stage renal disease ([place])   HCAP (healthcare-associated pneumonia)   Pressure injury of skin   1. Acute hypoxic respiratory failure, right upper lobe, aspiration pneumonia, present on admission, to rule out viral pneumonia due COVID 19. Patient with no significant dyspnea, oxymetry is 91% on room air. Has been afebrile. Continue to be encephalopathic, not able to take po medications or food due poor mentation. Possible aspiration pneumonia. Will continue antibiotic therapy with ceftriaxone (allergic to penicillin)/ dc aztreonam and vancomacyn. Follow up on legionella and streptococcus urine antigen.  Patient is high risk for severe ARDS due to COVID 19, will continue  remdesivir and dexamethasoone. Continue oxymetry monitoring.   2. Metabolic encephalopathy. Patient not verbal and not following commands, unclear baseline. Will continue neuro checks and aspiration precautions. Follow with speech evaluation.   3. ESRD on HD. Patient clinically euvolemic, K is 4.8 and serum bicarbonate is 17, Will follow with nephrology recommendations for futher HD.   4. Perineum stage 2 pressure ulcer. Present on admission, continue local wound care.   5. Chronic anemia. Hgb stable at 8,8 with Hct at 27.1. Follow with cell count.   6. Htn. Will hold on antihypertensive medications for now, will continue blood pressure monitoring.   7. T2DM. Continue glucose cover and monitoring with insulin sliding scale. Patient not tolerating po.   DVT prophylaxis: heparin   Code Status:  full Family Communication: no family at the bedside  Disposition Plan/ discharge barriers: pending clinical improvement.   Body mass index is 19.98 kg/m. Malnutrition Type:      Malnutrition Characteristics:      Nutrition Interventions:     RN Pressure Injury Documentation: Pressure Injury 03/24/2019 Perineum Bilateral Stage II -  Partial thickness loss of dermis presenting as a shallow open ulcer with a red, pink wound bed without slough. Pink wound bed (Active)  04/14/2019 2230  Location: Perineum  Location Orientation: Bilateral  Staging: Stage II -  Partial thickness loss of dermis presenting as a shallow open ulcer with a red, pink wound bed without slough.  Wound Description (Comments): Pink wound bed  Present on Admission: Yes     Consultants:   Nephrology   Procedures:     Antimicrobials:    Vancomycin/ Aztreonam 09/08 to 09/09  Ceftriaxone 09/09.  Subjective: Patient has been confused and disorientated, not following commands, no signs of pain or dyspnea, no nausea or vomiting. But no able to eat by mouth due to confusion.   Objective: Vitals:   04/05/2019  2058 03/29/19 0500 03/29/19 0859 03/29/19 0933  BP: 137/72   (!) 113/41  Pulse:   85 79  Resp: 16  20   Temp: 98.9 F (37.2 C)  98 F (36.7 C)   TempSrc: Axillary  Axillary   SpO2: 100%  91%   Weight: 59.5 kg 59.6 kg    Height: 5\' 8"  (1.727 m)       Intake/Output Summary (Last 24 hours) at 03/29/2019 1209 Last data filed at 04/09/2019 1913 Gross per 24 hour  Intake 605.53 ml  Output -  Net 605.53 ml   Filed Weights   04/16/2019 1437 03/24/2019 2058 03/29/19 0500  Weight: 63 kg 59.5 kg 59.6 kg    Examination:   General: deconditioned and ill looking appearing.  Neurology: positive somnolence not following commands,   E ENT: mild pallor, no icterus, oral mucosa moist Cardiovascular: No JVD. S1-S2 present, rhythmic, no gallops, rubs, or murmurs. No lower extremity edema. Pulmonary: positive  breath sounds bilaterally, no wheezing, rhonchi or rales. Gastrointestinal. Abdomen with no organomegaly, non tender, no rebound or guarding Skin. No rashes Musculoskeletal: no joint deformities     Data Reviewed: I have personally reviewed following labs and imaging studies  CBC: Recent Labs  Lab 03/22/19 1530 03/23/19 0651 03/23/19 1221 03/24/19 0412 03/27/2019 1433 03/29/19 0448  WBC 5.7 6.0 5.6 5.6 8.4 6.8  NEUTROABS 3.3  --   --   --  6.8  --   HGB 8.8* 9.5* 10.0* 9.7* 9.4* 8.8*  HCT 26.6* 29.0* 29.8* 29.1* 30.1* 27.2*  MCV 89.6 89.0 87.9 89.5 92.3 90.1  PLT 115* 143* 143* 144* 186 063   Basic Metabolic Panel: Recent Labs  Lab 03/22/19 1530 03/22/19 1740 03/23/19 0651 03/24/19 0412 03/23/2019 1433 03/29/19 0448  NA 143  --  136 139 144 143  K 3.8  --  3.5 3.6 5.0 4.8  CL 98  --  96* 98 101 103  CO2 25  --  24 25 21* 17*  GLUCOSE 86  --  68* 115* 211* 168*  BUN 59*  --  28* 46* 89* 98*  CREATININE 9.83*  --  5.93* 8.48* 16.72* 17.10*  CALCIUM 9.1  --  7.9* 8.4* 8.2* 7.7*  MG  --  2.6*  --   --  2.7*  --   PHOS  --  6.6*  --   --   --   --    GFR: Estimated  Creatinine Clearance: 3.1 mL/min (A) (by C-G formula based on SCr of 17.1 mg/dL (H)). Liver Function Tests: Recent Labs  Lab 03/22/19 1530 04/11/2019 1433 03/29/19 0448  AST 34 48* 42*  ALT 23 28 25   ALKPHOS 46 45 46  BILITOT 0.8 0.9 0.9  PROT 7.2 7.4 7.1  ALBUMIN 2.7* 2.6* 2.3*   Recent Labs  Lab 04/07/2019 1433  LIPASE 81*   Recent Labs  Lab 03/27/2019 1433  AMMONIA 24   Coagulation Profile: No results for input(s): INR, PROTIME in the last 168 hours. Cardiac Enzymes: No results for input(s): CKTOTAL, CKMB, CKMBINDEX, TROPONINI in the last 168 hours. BNP (last 3 results) No results for input(s): PROBNP in the last 8760 hours. HbA1C: No results for input(s): HGBA1C in the last 72 hours. CBG: Recent Labs  Lab  03/25/19 0819 03/25/19 1057 03/25/19 1419 04/15/2019 2050 03/29/19 0852  GLUCAP 133* 98 135* 161* 171*   Lipid Profile: No results for input(s): CHOL, HDL, LDLCALC, TRIG, CHOLHDL, LDLDIRECT in the last 72 hours. Thyroid Function Tests: Recent Labs    04/07/2019 1433  TSH 0.646   Anemia Panel: No results for input(s): VITAMINB12, FOLATE, FERRITIN, TIBC, IRON, RETICCTPCT in the last 72 hours.    Radiology Studies: I have reviewed all of the imaging during this hospital visit personally     Scheduled Meds: . B-complex with vitamin C  1 tablet Oral Daily  . calcium acetate  1,334 mg Oral TID  . carvedilol  6.25 mg Oral Q supper  . [START ON 03/30/2019] carvedilol  6.25 mg Oral Once per day on Sun Tue Thu Sat  . cholecalciferol  2,000 Units Oral Daily  . dexamethasone (DECADRON) injection  6 mg Intravenous Q24H  . famotidine  20 mg Oral 3 times weekly  . feeding supplement (PRO-STAT SUGAR FREE 64)  30 mL Oral BID  . heparin  5,000 Units Subcutaneous Q8H  . insulin aspart  0-5 Units Subcutaneous QHS  . insulin aspart  0-9 Units Subcutaneous TID WC  . [START ON 03/30/2019] isosorbide mononitrate  60 mg Oral Once per day on Sun Tue Thu Sat  . mirtazapine   7.5 mg Oral QHS  . multivitamin  1 tablet Oral QHS  . sevelamer carbonate  2,400 mg Oral TID  . zinc sulfate  220 mg Oral Daily   Continuous Infusions: . aztreonam 500 mg (03/29/19 0938)  . vancomycin       LOS: 1 day        Vernel Langenderfer Gerome Apley, MD

## 2019-03-29 NOTE — Consult Note (Addendum)
[facility] Renal Consultation Note    Indication for Consultation:  Management of ESRD/hemodialysis; anemia, hypertension/volume and secondary hyperparathyroidism PCP: Dr. Marco Collie  HPI: Lucas Calderon is a 78 y.o. male with ESRD on MWF [place] dialysis with PMHx significatn for DM, HTN, dementia previously in a SNF in [employer], just d/c 9/5 after a brief admission of new COVID positive status who was discharged to a local [facility]) so that he could dialyze at the [place] assigned dialysis in [place] because there was none in [place].  He presented to dialysis yesterday (now on TTS schedule) and was agitated, spitting at staff and refusing cannulation.  He was send to the ED for admission and was subsequently admitted.  Evaluation upon admission showed WBC increased since last d/c to 8.4 NW3 25 lactic acid 2.8 trop 94 TSH 0.646 BUN 89 Cr 16.7 Lipase 81 CXR showed new RUL infiltrate.  Head CT suboptimal with no acute changes. Tmax 99.2 VSS. He has been started on empiric antibiotics/decadron and has been refusing to eat or take meds. BC are pending  Last HD was 9/5.  Past Medical History:  Diagnosis Date  . Anemia   . Anorexia   . Arthritis   . Cancer ([place])    skin cancer - face  . CHF (congestive heart failure) ([place])   . Chronic kidney disease    dialysis T-TH-Sa  . Coronary artery disease   . Dementia ([place])   . Diabetes mellitus without complication ([place])    not taking any meds  . Dysphagia   . GERD (gastroesophageal reflux disease)   . Hypertension   . Pneumonia   . Shortness of breath    Past Surgical History:  Procedure Laterality Date  . APPENDECTOMY    . AV FISTULA PLACEMENT Left 09/11/2013   Procedure:  CREATION- RADIOCEPHALIC arteriovenous fistula;  Surgeon: Conrad Cullison, MD;  Location: [facility];  Service: Vascular;  Laterality: Left;  . FISTULOGRAM Left 01/31/2014   Procedure: FISTULOGRAM;  Surgeon: Conrad [place], MD;  Location: [facility];  Service: Vascular;   Laterality: Left;  . LIGATION OF COMPETING BRANCHES OF ARTERIOVENOUS FISTULA Left 01/31/2014   Procedure: REVISION OF ARTERIOVENOUS FISTULA;  Surgeon: Conrad Royal, MD;  Location: [facility] OR;  Service: Vascular;  Laterality: Left;   Family History  Problem Relation Age of Onset  . Diabetes Mother   . Heart disease Mother   . Hypertension Mother   . Diabetes Father   . Hypertension Father   . Peripheral vascular disease Father   . Hypertension Sister    Social History:  reports that he quit smoking about 25 years ago. He has never used smokeless tobacco. He reports that he does not drink alcohol or use drugs. Allergies  Allergen Reactions  . Penicillins Other (See Comments)    "Allergic," per [facility]    Prior to Admission medications   Medication Sig Start Date End Date Taking? Authorizing Provider  acetaminophen (TYLENOL) 325 MG tablet Take 650 mg by mouth every 6 (six) hours as needed for mild pain.    [provider]  B Complex-C-Folic Acid (VITA-BEE/C) TABS Take 1 tablet by mouth daily.    [provider]  calcium acetate (PHOSLO) 667 MG capsule Take 1,334 mg by mouth 3 (three) times daily.    [provider]  carvedilol (COREG) 6.25 MG tablet Take 6.25 mg by mouth See admin instructions. Take 6.25mg  in evening every day of the week. Take 6.25mg  in the morning  only on non-dialysis days: Tuesday, Thursday, Saturday and Sunday.    [provider]  Cholecalciferol (VITAMIN D) 50 MCG (2000 UT) tablet Take 2,000 Units by mouth daily.    [provider]  famotidine (PEPCID) 20 MG tablet Take 20 mg by mouth 3 (three) times a week. Monday, Wed, Friday    [provider]  hydrocortisone (ANUSOL-HC) 25 MG suppository Place 1 suppository (25 mg total) rectally 2 (two) times daily as needed for hemorrhoids or anal itching. 03/24/19   Samuella Cota, MD  isosorbide mononitrate (IMDUR) 60 MG 24 hr tablet Take 60 mg by mouth See admin instructions. Take  60mg  on Tuesdays, Thursdays, Saturdays and Sundays.    [provider]  mirtazapine (REMERON) 7.5 MG tablet Take 7.5 mg by mouth at bedtime.    [provider]  multivitamin (RENA-VIT) TABS tablet Take 1 tablet by mouth at bedtime. 11/22/13   Hongalgi, Lenis Dickinson, MD  RENVELA 800 MG tablet Take 2,400 mg by mouth 3 (three) times daily. 07/10/16   [provider]  Zinc Sulfate 220 (50 Zn) MG TABS Take 1 tablet (220 mg total) by mouth daily. 03/24/19   Samuella Cota, MD   Current Facility-Administered Medications  Medication Dose Route Frequency Provider Last Rate Last Dose  . acetaminophen (TYLENOL) tablet 650 mg  650 mg Oral Q6H PRN Jani Gravel, MD       Or  . acetaminophen (TYLENOL) suppository 650 mg  650 mg Rectal Q6H PRN Jani Gravel, MD      . calcium acetate (PHOSLO) capsule 1,334 mg  1,334 mg Oral TID Jani Gravel, MD      . cefTRIAXone (ROCEPHIN) 1 g in sodium chloride 0.9 % 100 mL IVPB  1 g Intravenous Q24H Arrien, Jimmy Picket, MD      . cholecalciferol (VITAMIN D3) tablet 2,000 Units  2,000 Units Oral Daily Jani Gravel, MD      . dexamethasone (DECADRON) injection 6 mg  6 mg Intravenous Q24H Jani Gravel, MD   6 mg at 03/29/19 0521  . feeding supplement (PRO-STAT SUGAR FREE 64) liquid 30 mL  30 mL Oral BID Jani Gravel, MD      . heparin injection 5,000 Units  5,000 Units Subcutaneous Q8H Jani Gravel, MD   5,000 Units at 03/29/19 1304  . hydrocortisone (ANUSOL-HC) suppository 25 mg  25 mg Rectal BID PRN Jani Gravel, MD      . insulin aspart (novoLOG) injection 0-5 Units  0-5 Units Subcutaneous QHS Jani Gravel, MD      . insulin aspart (novoLOG) injection 0-9 Units  0-9 Units Subcutaneous TID WC Jani Gravel, MD   2 Units at 03/29/19 1305  . mirtazapine (REMERON) tablet 7.5 mg  7.5 mg Oral QHS Jani Gravel, MD      . pantoprazole (PROTONIX) injection 40 mg  40 mg Intravenous Q24H Arrien, Jimmy Picket, MD      . sevelamer carbonate (RENVELA) tablet 2,400 mg  2,400 mg Oral  TID Jani Gravel, MD       Labs: Basic Metabolic Panel: Recent Labs  Lab 03/22/19 1740  03/24/19 0412 03/24/2019 1433 03/29/19 0448  NA  --    < > 139 144 143  K  --    < > 3.6 5.0 4.8  CL  --    < > 98 101 103  CO2  --    < > 25 21* 17*  GLUCOSE  --    < > 115* 211* 168*  BUN  --    < > 46* 89* 98*  CREATININE  --    < > 8.48* 16.72* 17.10*  CALCIUM  --    < > 8.4* 8.2* 7.7*  PHOS 6.6*  --   --   --   --    < > = values in this interval not displayed.   Liver Function Tests: Recent Labs  Lab 04/03/2019 1433 03/29/19 0448  AST 48* 42*  ALT 28 25  ALKPHOS 45 46  BILITOT 0.9 0.9  PROT 7.4 7.1  ALBUMIN 2.6* 2.3*   Recent Labs  Lab 04/18/2019 1433  LIPASE 81*   Recent Labs  Lab 04/12/2019 1433  AMMONIA 24   CBC: Recent Labs  Lab 03/23/19 0651 03/23/19 1221 03/24/19 0412 04/19/2019 1433 03/29/19 0448  WBC 6.0 5.6 5.6 8.4 6.8  NEUTROABS  --   --   --  6.8  --   HGB 9.5* 10.0* 9.7* 9.4* 8.8*  HCT 29.0* 29.8* 29.1* 30.1* 27.2*  MCV 89.0 87.9 89.5 92.3 90.1  PLT 143* 143* 144* 186 174   Cardiac Enzymes: No results for input(s): CKTOTAL, CKMB, CKMBINDEX, TROPONINI in the last 168 hours. CBG: Recent Labs  Lab 03/25/19 1057 03/25/19 1419 04/02/2019 2050 03/29/19 0852 03/29/19 1244  GLUCAP 98 135* 161* 171* 190*   Iron Studies: No results for input(s): IRON, TIBC, TRANSFERRIN, FERRITIN in the last 72 hours. Studies/Results: Ct Head Wo Contrast  Result Date: 04/05/2019 CLINICAL DATA:  78 year old who is COVID-19 positive, presenting with acute mental status changes. Current history of end-stage renal disease on hemodialysis. EXAM: CT HEAD WITHOUT CONTRAST TECHNIQUE: Contiguous axial images were obtained from the base of the skull through the vertex without intravenous contrast. COMPARISON:  08/07/2016 and earlier. FINDINGS: Patient motion blurred many of the images. Brain: Moderate cortical and deep atrophy, unchanged. Mild changes of small vessel disease of the white  matter diffusely, unchanged. No mass lesion. No midline shift. No acute hemorrhage or hematoma. No extra-axial fluid collections. No evidence of acute infarction. No interval change. Vascular: Severe BILATERAL carotid siphon atherosclerosis. No hyperdense vessel. Skull: Skull base suboptimally imaged due to patient motion. Allowing for this, no focal osseous abnormalities involving the skull. Sinuses/Orbits: Visualized paranasal sinuses well aerated. BILATERAL mastoid air cells and BILATERAL middle ear cavities also appear well aerated, though are blurred by patient motion. Orbits and globes not optimally imaged due to patient motion. For Other: None. IMPRESSION: 1. Motion degraded examination demonstrates no acute intracranial abnormality. 2. Stable moderate generalized atrophy and mild chronic microvascular ischemic changes of the white matter. Electronically Signed   By: Evangeline Dakin M.D.   On: 04/18/2019 17:48   Dg Chest Portable 1 View  Result Date: 04/15/2019 CLINICAL DATA:  Acute onset of chest pain and shortness of breath. Patient is COVID-19 positive. EXAM: PORTABLE CHEST 1 VIEW COMPARISON:  03/22/2019 and earlier. FINDINGS: Cardiac silhouette normal in size, unchanged. Thoracic aorta markedly atherosclerotic, unchanged. Interval development of ground-glass airspace consolidation in the RIGHT UPPER LOBE since the examination 6 days ago. No new lung parenchymal findings elsewhere in either lung. Normal pulmonary vascularity. No visible pleural effusions. IMPRESSION: Acute RIGHT upper lobe pneumonia, likely viral. Electronically Signed   By: Evangeline Dakin M.D.   On: 04/16/2019 14:37    ROS: As per HPI otherwise negative. Patient Unable to provide. Physical Exam: Vitals:   03/21/2019 2058 03/29/19 0500 03/29/19 0859 03/29/19 0933  BP: 137/72   (!) 113/41  Pulse:   85  79  Resp: 16  20   Temp: 98.9 F (37.2 C)  98 F (36.7 C)   TempSrc: Axillary  Axillary   SpO2: 100%  91%   Weight: 59.5  kg 59.6 kg    Height: 5\' 8"  (1.727 m)      Gen: Somnolent/withdrawn, poorly responsive to voice laying on his left side HEENT: Atraumatic, normocephalic, conjunctive are normal, no icterus Neck: Supple, no JVD or goiter Heart: Pulse regular rhythm, normal rate, S1 and S2 normal Lungs: Clear to auscultation bilaterally, no rales/rhonchi Abd: Soft, flat, nontender, bowel sounds normal Extr: No lower extremity edema Neuro: Moving arms and legs intermittently, poorly responsive to verbal commands Dialysis Access:  Left radiocephalic AVF  Dialysis Orders:  Emilie Rutter TTS EDW lowered from 66.5 to 63 last admission Mircera 50 due 9/8  4 hr 400/800 2K 2.25 Ca profile 2 left AVF venofer 100 x 5 - not given yet, heparin 2000 - no VDRA  Assessment/Plan: 1. COVID positive/RUL infiltrate - at SNF prior to last admission - still positive with new right UL infiltrate - per primary - additional work up in progress - antibiotics/decadron per primary 2. ESRD -  TTS K 4.8 CO2 17 BU 98 Cr 17 - HD today off schedule as a separate b- see if mentatio  Clears some with HD; some of elevated Cr may be related to underdialysis as COVID patients are only being run 3 hrs in stead of usual times - will follow trends 3. Hypertension/volume  - current weight below last d/c weight - BP ok 4. Anemia  - hgb 9.7 at last d/c now 8.8 - due for redose of ESA  5. Metabolic bone disease -  No VDRA 6. Nutrition - renal carb mod/add renavite/nepro -however - not eating or taking meds per primary 7. DM - meds stopped last admission 8. Dementia - baseline per 9/2 note, he speaks litte with 1-2 word responses typical -   Myriam Jacobson, PA-C [facility] Beeper [phone] 03/29/2019, 3:56 PM      I have personally seen and examined this patient and agree with the assessment/plan as outlined above by Alric Seton, PA.  Indeed altered mentation may be in part from his relatively worse azotemia in the setting of  underlying dementia that he has at baseline (usually not interactive or animated but will respond to closed-end questions).  Undertake hemodialysis today and possibly again tomorrow based on mental status. Augusta Mirkin K.,MD 03/29/2019 4:36 PM

## 2019-03-30 DIAGNOSIS — I10 Essential (primary) hypertension: Secondary | ICD-10-CM

## 2019-03-30 DIAGNOSIS — J181 Lobar pneumonia, unspecified organism: Secondary | ICD-10-CM

## 2019-03-30 DIAGNOSIS — L89152 Pressure ulcer of sacral region, stage 2: Secondary | ICD-10-CM

## 2019-03-30 LAB — CBC WITH DIFFERENTIAL/PLATELET
Abs Immature Granulocytes: 0.11 10*3/uL — ABNORMAL HIGH (ref 0.00–0.07)
Basophils Absolute: 0 10*3/uL (ref 0.0–0.1)
Basophils Relative: 0 %
Eosinophils Absolute: 0 10*3/uL (ref 0.0–0.5)
Eosinophils Relative: 0 %
HCT: 26.5 % — ABNORMAL LOW (ref 39.0–52.0)
Hemoglobin: 8.9 g/dL — ABNORMAL LOW (ref 13.0–17.0)
Immature Granulocytes: 2 %
Lymphocytes Relative: 7 %
Lymphs Abs: 0.5 10*3/uL — ABNORMAL LOW (ref 0.7–4.0)
MCH: 29.6 pg (ref 26.0–34.0)
MCHC: 33.6 g/dL (ref 30.0–36.0)
MCV: 88 fL (ref 80.0–100.0)
Monocytes Absolute: 0.3 10*3/uL (ref 0.1–1.0)
Monocytes Relative: 5 %
Neutro Abs: 6.4 10*3/uL (ref 1.7–7.7)
Neutrophils Relative %: 86 %
Platelets: 165 10*3/uL (ref 150–400)
RBC: 3.01 MIL/uL — ABNORMAL LOW (ref 4.22–5.81)
RDW: 14.1 % (ref 11.5–15.5)
WBC: 7.3 10*3/uL (ref 4.0–10.5)
nRBC: 0 % (ref 0.0–0.2)

## 2019-03-30 LAB — CULTURE, BLOOD (ROUTINE X 2): Special Requests: ADEQUATE

## 2019-03-30 LAB — BASIC METABOLIC PANEL
Anion gap: 21 — ABNORMAL HIGH (ref 5–15)
BUN: 40 mg/dL — ABNORMAL HIGH (ref 8–23)
CO2: 21 mmol/L — ABNORMAL LOW (ref 22–32)
Calcium: 7.2 mg/dL — ABNORMAL LOW (ref 8.9–10.3)
Chloride: 96 mmol/L — ABNORMAL LOW (ref 98–111)
Creatinine, Ser: 8.96 mg/dL — ABNORMAL HIGH (ref 0.61–1.24)
GFR calc Af Amer: 6 mL/min — ABNORMAL LOW (ref >60)
GFR calc non Af Amer: 5 mL/min — ABNORMAL LOW (ref >60)
Glucose, Bld: 160 mg/dL — ABNORMAL HIGH (ref 70–99)
Potassium: 4 mmol/L (ref 3.5–5.1)
Sodium: 138 mmol/L (ref 135–145)

## 2019-03-30 LAB — GLUCOSE, CAPILLARY
Glucose-Capillary: 134 mg/dL — ABNORMAL HIGH (ref 70–99)
Glucose-Capillary: 145 mg/dL — ABNORMAL HIGH (ref 70–99)
Glucose-Capillary: 146 mg/dL — ABNORMAL HIGH (ref 70–99)
Glucose-Capillary: 163 mg/dL — ABNORMAL HIGH (ref 70–99)
Glucose-Capillary: 204 mg/dL — ABNORMAL HIGH (ref 70–99)

## 2019-03-30 NOTE — Progress Notes (Signed)
Patient ID: Lucas Calderon, male   DOB: 12/25/1940, 78 y.o.   MRN: [id number] [facility] Progress Note   Assessment/ Plan:   1. COVID positive/RUL infiltrate -with right upper lobe infiltrate, on Decadron and coverage for H CAP with ceftriaxone and vancomycin. 2. ESRD -continue hemodialysis on a TTS schedule with hemodialysis again today-underwent hemodialysis yesterday to try and see if we could make a positive impact on his encephalopathy that could be in part from azotemia/uremia with altered schedule/time on hemodialysis. 3. Hypertension/volume  -he appears to be euvolemic on exam with limited oral intake.  Cautious ultrafiltration with dialysis. 4. Anemia  -continue ESA for anemia of chronic disease/acute illness. 5. Metabolic bone disease -  currently he has limited oral intake, not on VDRA 6. Nutrition -continue efforts at encouraging dietary intake/nutritional supplementation 7. DM - meds stopped last admission 8. Dementia -present at baseline, suspect vascular versus age-related  Subjective:   Without acute events overnight, underwent hemodialysis yesterday   Objective:   BP (!) 96/53 (BP Location: Right Arm)   Pulse 81   Temp 97.8 F (36.6 C) (Axillary)   Resp 16   Ht 5\' 8"  (1.727 m)   Wt 60.1 kg   SpO2 90%   BMI 20.15 kg/m   Physical Exam: Gen: Slightly more interactive than yesterday, awakens to touch and mumbles responses CVS: Pulse regular rhythm, normal rate, S1 and S2 normal Resp: Clear to auscultation, no rales/rhonchi Abd: Soft, flat, nontender, bowel sounds normal Ext: No lower extremity edema, left radiocephalic fistula with intact dressings  Labs: BMET Recent Labs  Lab 03/24/19 0412 03/21/2019 1433 03/29/19 0448 03/30/19 0431  NA 139 144 143 138  K 3.6 5.0 4.8 4.0  CL 98 101 103 96*  CO2 25 21* 17* 21*  GLUCOSE 115* 211* 168* 160*  BUN 46* 89* 98* 40*  CREATININE 8.48* 16.72* 17.10* 8.96*  CALCIUM 8.4* 8.2* 7.7* 7.2*   CBC Recent  Labs  Lab 03/24/19 0412 03/21/2019 1433 03/29/19 0448 03/30/19 0431  WBC 5.6 8.4 6.8 7.3  NEUTROABS  --  6.8  --  6.4  HGB 9.7* 9.4* 8.8* 8.9*  HCT 29.1* 30.1* 27.2* 26.5*  MCV 89.5 92.3 90.1 88.0  PLT 144* 186 174 165   Medications:    . calcium acetate  1,334 mg Oral TID  . Chlorhexidine Gluconate Cloth  6 each Topical Q0600  . cholecalciferol  2,000 Units Oral Daily  . dexamethasone (DECADRON) injection  6 mg Intravenous Q24H  . feeding supplement (PRO-STAT SUGAR FREE 64)  30 mL Oral BID  . heparin  5,000 Units Subcutaneous Q8H  . insulin aspart  0-5 Units Subcutaneous QHS  . insulin aspart  0-9 Units Subcutaneous TID WC  . mirtazapine  7.5 mg Oral QHS  . pantoprazole (PROTONIX) IV  40 mg Intravenous Q24H  . sevelamer carbonate  2,400 mg Oral TID   Elmarie Shiley, MD 03/30/2019, 8:48 AM

## 2019-03-30 NOTE — Progress Notes (Signed)
Pt refused all meds and food/beverage this am. Attempted to put food up to mouth, patient said "No". Will continue to offer food/beverage with each encounter.

## 2019-03-30 NOTE — Progress Notes (Signed)
SLP Cancellation Note  Patient Details Name: Lucas Calderon MRN: [id number] DOB: 1940/09/06   Cancelled evaluation:       Reason Eval/Treat Not Completed: Attempted to see pt for swallow assessment - pt turning head away, refusing approaching water with straw and ice chips.  Encouraged and re-attempted using various strategies, but pt consistent in his refusals. Will continue efforts.  Lucas Calderon, [place] CCC/SLP Acute Rehabilitation Services Office number [phone] Pager [phone]    Lucas Calderon 03/30/2019, 3:51 PM

## 2019-03-30 NOTE — Progress Notes (Addendum)
PROGRESS NOTE    Lucas Calderon  OYD:[id number] DOB: May 05, 1941 DOA: 04/16/2019 PCP: Maryella Shivers, MD    Brief Narrative:  78 year old male who presented with altered mental status, transition nursing facility.  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  He was noted to have worsening mental status at the nursing facility and he was sent to the hospital.  He had a recent hospitalization for COVID-19 infection (03/22/19 to 03/25/19).  On his initial physical examination his temperature was 99.2, heart rate 96, respiratory 16, blood pressure 119/70, oxygenation 95% on room air.  His lungs had rales at the left lung base, right lung field, no wheezing, heart S1-S2 present rhythm, abdomen soft, no lower extremity edema.  SARS COVID-19 was positive.  Chest x-ray had a new right upper lobe infiltrate.  Patient was admitted to the hospital working diagnosis of acute hypoxic respiratory failure due to SARS COVID-19 pneumonia.   Assessment & Plan:   Principal Problem:   COVID-19 Active Problems:   HTN (hypertension)   Anemia in CKD (chronic kidney disease)   Diabetes mellitus, type 2 (HCC)   End stage renal disease ([place])   HCAP (healthcare-associated pneumonia)   Pressure injury of skin    1. Acute hypoxic respiratory failure, right upper lobe, aspiration pneumonia, present on admission, in the setting of COVID 19 infection. Patient continue on room air, with oxygenation 94% on room air, continue to be afebrile. He is tolerating well antibiotic therapy with IV ceftriaxone. Blood culture positive for coagulase negative staph, likely a contamination.   No clinical signs of ARDS, patient with no increase oxygen requirements, will dc steroids for now and continue close monitoring.    2. Metabolic encephalopathy. Continue to have significant encephalopathy, he is  no following commands. Not able to take po medications, required restrains for hemodialysis yesterday. Will call his family to assess his baseline.    3. ESRD on HD. Patient had HD yesterday, today's K is 4,0 with bicarbonate at 21. Will continue to follow with nephrology recommendations for further renal replacement therapy.  4. Perineum stage 2 pressure ulcer. Local wound care.   5. Chronic anemia. Stable cell count with Hgb at 8,9 and hct at 26.    6. Htn. Continue holding antihypertensive medications, blood pressure systolic today 672/09. At home on carvedilol, and isosorbide.   7. T2DM. Fasting glucose 160, no oral intake due to encephalopathy, will continue with insulin sliding scale, for glucose cover and monitoring. .   DVT prophylaxis: heparin   Code Status:  full Family Communication: no family at the bedside  Disposition Plan/ discharge barriers: pending clinical improvement.      Body mass index is 20.15 kg/m. Malnutrition Type:      Malnutrition Characteristics:      Nutrition Interventions:     RN Pressure Injury Documentation: Pressure Injury 04/02/2019 Perineum Bilateral Stage II -  Partial thickness loss of dermis presenting as a shallow open ulcer with a red, pink wound bed without slough. Pink wound bed (Active)  04/10/2019 2230  Location: Perineum  Location Orientation: Bilateral  Staging: Stage II -  Partial thickness loss of dermis presenting as a shallow open ulcer with a red, pink wound bed without slough.  Wound Description (Comments): Pink wound bed  Present on Admission: Yes     Consultants:   Nephrology   Procedures:     Antimicrobials:  Subjective: Patient is not interactive, does not look in pain or dyspnea, no nausea or vomiting, has not been able to eat due to cognitive impairment.   Objective: Vitals:   03/29/19 2326 03/30/19 0006 03/30/19 0500 03/30/19 0800  BP: (!) 133/58 114/76  (!) 96/53  Pulse: 82 84  81   Resp: 14 16  16   Temp: 97.8 F (36.6 C) 97.7 F (36.5 C)  97.8 F (36.6 C)  TempSrc: Axillary Axillary  Axillary  SpO2:  100%  90%  Weight:   60.1 kg   Height:        Intake/Output Summary (Last 24 hours) at 03/30/2019 1224 Last data filed at 03/29/2019 2326 Gross per 24 hour  Intake -  Output 418 ml  Net -418 ml   Filed Weights   03/29/19 0500 03/29/19 2001 03/30/19 0500  Weight: 59.6 kg 59.5 kg 60.1 kg    Examination:   General: deconditioned and ill looking appearing  Neurology: Awake, somnolent, opens eyes to voice, but not verbal.  E ENT: mild pallor, no icterus, oral mucosa moist Cardiovascular: No JVD. S1-S2 present, rhythmic, no gallops, rubs, or murmurs. No lower extremity edema. Pulmonary: positive breath sounds bilaterally, no wheezing, rhonchi or rales. Gastrointestinal. Abdomen with, no organomegaly, non tender, no rebound or guarding Skin. No rashes Musculoskeletal: no joint deformities     Data Reviewed: I have personally reviewed following labs and imaging studies  CBC: Recent Labs  Lab 03/24/19 0412 04/16/2019 1433 03/29/19 0448 03/30/19 0431  WBC 5.6 8.4 6.8 7.3  NEUTROABS  --  6.8  --  6.4  HGB 9.7* 9.4* 8.8* 8.9*  HCT 29.1* 30.1* 27.2* 26.5*  MCV 89.5 92.3 90.1 88.0  PLT 144* 186 174 408   Basic Metabolic Panel: Recent Labs  Lab 03/24/19 0412 04/05/2019 1433 03/29/19 0448 03/30/19 0431  NA 139 144 143 138  K 3.6 5.0 4.8 4.0  CL 98 101 103 96*  CO2 25 21* 17* 21*  GLUCOSE 115* 211* 168* 160*  BUN 46* 89* 98* 40*  CREATININE 8.48* 16.72* 17.10* 8.96*  CALCIUM 8.4* 8.2* 7.7* 7.2*  MG  --  2.7*  --   --    GFR: Estimated Creatinine Clearance: 6.1 mL/min (A) (by C-G formula based on SCr of 8.96 mg/dL (H)). Liver Function Tests: Recent Labs  Lab 04/06/2019 1433 03/29/19 0448  AST 48* 42*  ALT 28 25  ALKPHOS 45 46  BILITOT 0.9 0.9  PROT 7.4 7.1  ALBUMIN 2.6* 2.3*   Recent Labs  Lab 03/27/2019 1433  LIPASE 81*   Recent Labs   Lab 04/14/2019 1433  AMMONIA 24   Coagulation Profile: No results for input(s): INR, PROTIME in the last 168 hours. Cardiac Enzymes: No results for input(s): CKTOTAL, CKMB, CKMBINDEX, TROPONINI in the last 168 hours. BNP (last 3 results) No results for input(s): PROBNP in the last 8760 hours. HbA1C: No results for input(s): HGBA1C in the last 72 hours. CBG: Recent Labs  Lab 03/29/19 0852 03/29/19 1244 03/29/19 1633 03/29/19 2359 03/30/19 0835  GLUCAP 171* 190* 164* 134* 163*   Lipid Profile: No results for input(s): CHOL, HDL, LDLCALC, TRIG, CHOLHDL, LDLDIRECT in the last 72 hours. Thyroid Function Tests: Recent Labs    04/10/2019 1433  TSH 0.646   Anemia Panel: No results for input(s): VITAMINB12, FOLATE, FERRITIN, TIBC, IRON, RETICCTPCT in the last 72 hours.    Radiology Studies: I have reviewed all of the imaging during this hospital visit personally  Scheduled Meds: . calcium acetate  1,334 mg Oral TID  . Chlorhexidine Gluconate Cloth  6 each Topical Q0600  . cholecalciferol  2,000 Units Oral Daily  . dexamethasone (DECADRON) injection  6 mg Intravenous Q24H  . feeding supplement (PRO-STAT SUGAR FREE 64)  30 mL Oral BID  . heparin  5,000 Units Subcutaneous Q8H  . insulin aspart  0-5 Units Subcutaneous QHS  . insulin aspart  0-9 Units Subcutaneous TID WC  . mirtazapine  7.5 mg Oral QHS  . pantoprazole (PROTONIX) IV  40 mg Intravenous Q24H  . sevelamer carbonate  2,400 mg Oral TID   Continuous Infusions: . cefTRIAXone (ROCEPHIN)  IV 1 g (03/29/19 1614)     LOS: 2 days        Dhanvi Boesen Gerome Apley, MD

## 2019-03-31 LAB — CBC WITH DIFFERENTIAL/PLATELET
Abs Immature Granulocytes: 0.09 10*3/uL — ABNORMAL HIGH (ref 0.00–0.07)
Basophils Absolute: 0 10*3/uL (ref 0.0–0.1)
Basophils Relative: 0 %
Eosinophils Absolute: 0 10*3/uL (ref 0.0–0.5)
Eosinophils Relative: 0 %
HCT: 27.2 % — ABNORMAL LOW (ref 39.0–52.0)
Hemoglobin: 9 g/dL — ABNORMAL LOW (ref 13.0–17.0)
Immature Granulocytes: 1 %
Lymphocytes Relative: 7 %
Lymphs Abs: 0.6 10*3/uL — ABNORMAL LOW (ref 0.7–4.0)
MCH: 29.5 pg (ref 26.0–34.0)
MCHC: 33.1 g/dL (ref 30.0–36.0)
MCV: 89.2 fL (ref 80.0–100.0)
Monocytes Absolute: 0.5 10*3/uL (ref 0.1–1.0)
Monocytes Relative: 6 %
Neutro Abs: 6.8 10*3/uL (ref 1.7–7.7)
Neutrophils Relative %: 86 %
Platelets: 204 10*3/uL (ref 150–400)
RBC: 3.05 MIL/uL — ABNORMAL LOW (ref 4.22–5.81)
RDW: 14.1 % (ref 11.5–15.5)
WBC: 8 10*3/uL (ref 4.0–10.5)
nRBC: 0 % (ref 0.0–0.2)

## 2019-03-31 LAB — BASIC METABOLIC PANEL
Anion gap: 18 — ABNORMAL HIGH (ref 5–15)
BUN: 48 mg/dL — ABNORMAL HIGH (ref 8–23)
CO2: 23 mmol/L (ref 22–32)
Calcium: 7.4 mg/dL — ABNORMAL LOW (ref 8.9–10.3)
Chloride: 98 mmol/L (ref 98–111)
Creatinine, Ser: 7.95 mg/dL — ABNORMAL HIGH (ref 0.61–1.24)
GFR calc Af Amer: 7 mL/min — ABNORMAL LOW (ref >60)
GFR calc non Af Amer: 6 mL/min — ABNORMAL LOW (ref >60)
Glucose, Bld: 138 mg/dL — ABNORMAL HIGH (ref 70–99)
Potassium: 4.3 mmol/L (ref 3.5–5.1)
Sodium: 139 mmol/L (ref 135–145)

## 2019-03-31 LAB — GLUCOSE, CAPILLARY
Glucose-Capillary: 145 mg/dL — ABNORMAL HIGH (ref 70–99)
Glucose-Capillary: 144 mg/dL — ABNORMAL HIGH (ref 70–99)
Glucose-Capillary: 155 mg/dL — ABNORMAL HIGH (ref 70–99)
Glucose-Capillary: 126 mg/dL — ABNORMAL HIGH (ref 70–99)

## 2019-03-31 NOTE — Progress Notes (Signed)
Patient ID: Lucas Calderon, male   DOB: 1940/11/22, 78 y.o.   MRN: [id number] [facility] Progress Note   Assessment/ Plan:   1. COVID positive/RUL infiltrate -with right upper lobe infiltrate, on Decadron and coverage for H CAP with ceftriaxone and vancomycin. 2. ESRD -continue hemodialysis on a TTS schedule, indeed possible that earlier mental status changes may have been influenced by azotemia superimposed on his dementia and recent changes in living arrangements/dialysis following COVID positive status. 3. Hypertension/volume  -he appears to be euvolemic on exam with limited oral intake.  Cautious ultrafiltration with dialysis. 4. Anemia  -continue ESA for anemia of chronic disease/acute illness. 5. Metabolic bone disease -  currently he has limited oral intake, not on VDRA 6. Nutrition -continue efforts at encouraging dietary intake/nutritional supplementation 7. DM - meds stopped last admission 8. Dementia -present at baseline, suspect vascular versus age-related  Subjective:   Without acute events overnight, underwent hemodialysis yesterday.  Still needing aggressive prompting to encourage oral intake.   Objective:   BP (!) 109/55 (BP Location: Right Arm)   Pulse 86   Temp 98 F (36.7 C) (Axillary)   Resp 16   Ht 5\' 8"  (1.727 m)   Wt 58 kg   SpO2 95%   BMI 19.44 kg/m   Physical Exam: Gen: More lucid than yesterday-attempts to mumble responses but largely incorrigible CVS: Pulse regular rhythm, normal rate, S1 and S2 normal Resp: Clear to auscultation, no rales/rhonchi Abd: Soft, flat, nontender, bowel sounds normal Ext: No lower extremity edema, left radiocephalic fistula with intact dressings  Labs: BMET Recent Labs  Lab 04/05/2019 1433 03/29/19 0448 03/30/19 0431 03/31/19 0528  NA 144 143 138 139  K 5.0 4.8 4.0 4.3  CL 101 103 96* 98  CO2 21* 17* 21* 23  GLUCOSE 211* 168* 160* 138*  BUN 89* 98* 40* 48*  CREATININE 16.72* 17.10* 8.96* 7.95*   CALCIUM 8.2* 7.7* 7.2* 7.4*   CBC Recent Labs  Lab 03/30/2019 1433 03/29/19 0448 03/30/19 0431 03/31/19 0528  WBC 8.4 6.8 7.3 8.0  NEUTROABS 6.8  --  6.4 6.8  HGB 9.4* 8.8* 8.9* 9.0*  HCT 30.1* 27.2* 26.5* 27.2*  MCV 92.3 90.1 88.0 89.2  PLT 186 174 165 204   Medications:    . calcium acetate  1,334 mg Oral TID  . Chlorhexidine Gluconate Cloth  6 each Topical Q0600  . cholecalciferol  2,000 Units Oral Daily  . feeding supplement (PRO-STAT SUGAR FREE 64)  30 mL Oral BID  . heparin  5,000 Units Subcutaneous Q8H  . insulin aspart  0-5 Units Subcutaneous QHS  . insulin aspart  0-9 Units Subcutaneous TID WC  . mirtazapine  7.5 mg Oral QHS  . pantoprazole (PROTONIX) IV  40 mg Intravenous Q24H  . sevelamer carbonate  2,400 mg Oral TID   Elmarie Shiley, MD 03/31/2019, 8:54 AM

## 2019-03-31 NOTE — Progress Notes (Signed)
PROGRESS NOTE    Lucas Calderon  WFU:[id number] DOB: June 15, 1941 DOA: 04/11/2019 PCP: Maryella Shivers, MD    Brief Narrative:  78 year old male who presented with altered mental status,transfer from nursing facility. 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 He had a recent hospitalization for COVID-19 infection (03/22/19 to 03/25/19). On his initial physical examination his temperature was 99.2, heart rate 96, respiratory 16, blood pressure 119/70, oxygenation 95% on room air. His lungs had rales at the left lung base, right lung field, no wheezing, heart S1-S2 present rhythm, abdomen soft, no lower extremity edema. SARS COVID-19 was positive. Chest x-ray had a new right upper lobe infiltrate.  Patient was admitted to the hospital working diagnosis of acute hypoxic respiratory failure due to SARS COVID-19 pneumonia  No clinical signs of viral pneumonia, no respiratory distress or significant oxygen desaturation. Currently treated for aspiration pneumonia with IV ceftriaxone.   Mentation has slowly improved but continue with no oral intake.   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 Acute hypoxic respiratory failure, right upper lobe, aspiration pneumonia, present on admission, in the setting of COVID 19 infection.No clinical signs of respiratory distress, oxygen saturation is 91% to 94% on room air. No clinical signs of viral pneumonia, will continue IV ceftriaxone for possible aspiration pneumonia. Blood  culture positive for coagulase negative staph, likely a contamination. Continue to hold on systemic steroids for now. Aspiration precautions, speech therapy recommends dysphagia 2 diet.    2. Metabolic encephalopathy/ in the setting of dementia. More awake this am, still not following commands and still with no po intake, will continue to encourage po intake, and neuro checks per unit protocol, I spoke with his daughter and patient has been showing signs of cognitive decline for some time.  Currently nursing home resident.   3. ESRD on HD. Tolerating HD well, continue to follow nephrology recommendations.  4. Perineum stage 2 pressure ulcer. Continue with local wound care.  5. Chronic anemia. No signs of worsening,   6. Htn. Holding carvedilol, and isosorbide. Continue blood pressure monitoring.   7. T2DM. Fasting glucose 138 this am. Continue with no oral intake. Continue insulin sliding scale for glucose cover and monitoring.  8. Perineum stage 2 pressure ulcer present on admission. Continue with local wound care.   DVT prophylaxis:heparin Code Status:full Family Communication:no family at the bedside Disposition Plan/ discharge barriers:pending clinical improvement.      Body mass index is 19.44 kg/m. Malnutrition Type:      Malnutrition Characteristics:      Nutrition Interventions:     RN Pressure Injury Documentation: Pressure Injury 04/17/2019 Perineum Bilateral Stage II -  Partial thickness loss of dermis presenting as a shallow open ulcer with a red, pink wound bed without slough. Pink wound bed (Active)  04/07/2019 2230  Location: Perineum  Location Orientation: Bilateral  Staging: Stage II -  Partial thickness loss of dermis presenting as a shallow open ulcer with a red, pink wound bed without slough.  Wound Description (Comments): Pink wound bed  Present  on Admission: Yes     Consultants:   Nephrology   Procedures:      Antimicrobials:       Subjective: Patient is more awake and reactive today, but still not able to eat by mouth due to cognitive dysfunction. No nausea or vomiting, and no evident dyspnea.   Objective: Vitals:   03/31/19 0000 03/31/19 0030 03/31/19 0045 03/31/19 0915  BP: (!) 101/44 (!) 107/52 (!) 109/55 (!) 129/96  Pulse: 79 87 86 79  Resp: 15 16 16 16   Temp:   98 F (36.7 C) 98.7 F (37.1 C)  TempSrc:   Axillary Axillary  SpO2: 96% 95% 95% 94%  Weight:   58 kg   Height:        Intake/Output Summary (Last 24 hours) at 03/31/2019 1014 Last data filed at 03/31/2019 0518 Gross per 24 hour  Intake 200 ml  Output 1000 ml  Net -800 ml   Filed Weights   03/30/19 0500 03/30/19 2140 03/31/19 0045  Weight: 60.1 kg 59 kg 58 kg    Examination:   General: Not in pain or dyspnea, deconditioned  Neurology: Awake and alert, responds to simple questions, not following commands.  E ENT: mild pallor, no icterus, oral mucosa moist Cardiovascular: No JVD. S1-S2 present, rhythmic, no gallops, rubs, or murmurs. No lower extremity edema. Pulmonary: positive breath sounds bilaterally, adequate air movement, no wheezing, rhonchi or rales. Gastrointestinal. Abdomen with no organomegaly, non tender, no rebound or guarding Skin. No rashes Musculoskeletal: no joint deformities     Data Reviewed: I have personally reviewed following labs and imaging studies  CBC: Recent Labs  Lab 04/15/2019 1433 03/29/19 0448 03/30/19 0431 03/31/19 0528  WBC 8.4 6.8 7.3 8.0  NEUTROABS 6.8  --  6.4 6.8  HGB 9.4* 8.8* 8.9* 9.0*  HCT 30.1* 27.2* 26.5* 27.2*  MCV 92.3 90.1 88.0 89.2  PLT 186 174 165 947   Basic Metabolic Panel: Recent Labs  Lab 03/31/2019 1433 03/29/19 0448 03/30/19 0431 03/31/19 0528  NA 144 143 138 139  K 5.0 4.8 4.0 4.3  CL 101 103 96* 98  CO2 21* 17* 21* 23  GLUCOSE 211* 168* 160* 138*  BUN 89* 98* 40* 48*  CREATININE 16.72* 17.10* 8.96* 7.95*  CALCIUM 8.2* 7.7* 7.2*  7.4*  MG 2.7*  --   --   --    GFR: Estimated Creatinine Clearance: 6.6 mL/min (A) (by C-G formula based on SCr of 7.95 mg/dL (H)). Liver Function Tests: Recent Labs  Lab 03/25/2019 1433 03/29/19 0448  AST 48* 42*  ALT 28 25  ALKPHOS 45 46  BILITOT 0.9 0.9  PROT 7.4 7.1  ALBUMIN 2.6* 2.3*   Recent Labs  Lab 04/16/2019 1433  LIPASE 81*   Recent Labs  Lab 04/06/2019 1433  AMMONIA 24   Coagulation Profile: No results for input(s): INR, PROTIME in the last 168 hours. Cardiac Enzymes: No results for input(s): CKTOTAL, CKMB, CKMBINDEX, TROPONINI in the last 168 hours. BNP (last 3 results) No results for input(s): PROBNP in the last 8760 hours. HbA1C: No results for input(s): HGBA1C in the last 72 hours. CBG: Recent Labs  Lab 03/30/19 0835 03/30/19 1301 03/30/19 1646 03/30/19 2052 03/31/19 0933  GLUCAP 163* 204* 146* 145* 145*   Lipid Profile: No results for input(s): CHOL, HDL, LDLCALC, TRIG, CHOLHDL, LDLDIRECT in the last 72 hours. Thyroid Function Tests: Recent Labs    03/27/2019 1433  TSH 0.646   Anemia Panel: No results for input(s): VITAMINB12,  FOLATE, FERRITIN, TIBC, IRON, RETICCTPCT in the last 72 hours.    Radiology Studies: I have reviewed all of the imaging during this hospital visit personally     Scheduled Meds:  calcium acetate  1,334 mg Oral TID   Chlorhexidine Gluconate Cloth  6 each Topical Q0600   cholecalciferol  2,000 Units Oral Daily   feeding supplement (PRO-STAT SUGAR FREE 64)  30 mL Oral BID   heparin  5,000 Units Subcutaneous Q8H   insulin aspart  0-5 Units Subcutaneous QHS   insulin aspart  0-9 Units Subcutaneous TID WC   mirtazapine  7.5 mg Oral QHS   pantoprazole (PROTONIX) IV  40 mg Intravenous Q24H   sevelamer carbonate  2,400 mg Oral TID   Continuous Infusions:  cefTRIAXone (ROCEPHIN)  IV 1 g (03/31/19 0939)     LOS: 3 days        Zita Ozimek Gerome Apley, MD

## 2019-03-31 NOTE — Evaluation (Signed)
Clinical/Bedside Swallow Evaluation Patient Details  Name: Lucas Calderon MRN: [id number] Date of Birth: October 02, 1940  Today's Date: 03/31/2019 Time: SLP Start Time (ACUTE ONLY): 1250 SLP Stop Time (ACUTE ONLY): 1300 SLP Time Calculation (min) (ACUTE ONLY): 10 min  Past Medical History:  Past Medical History:  Diagnosis Date  . Anemia   . Anorexia   . Arthritis   . Cancer ([place])    skin cancer - face  . CHF (congestive heart failure) ([place])   . Chronic kidney disease    dialysis T-TH-Sa  . Coronary artery disease   . Dementia ([place])   . Diabetes mellitus without complication ([place])    not taking any meds  . Dysphagia   . GERD (gastroesophageal reflux disease)   . Hypertension   . Pneumonia   . Shortness of breath    Past Surgical History:  Past Surgical History:  Procedure Laterality Date  . APPENDECTOMY    . AV FISTULA PLACEMENT Left 09/11/2013   Procedure:  CREATION- RADIOCEPHALIC arteriovenous fistula;  Surgeon: Conrad [place], MD;  Location: [place];  Service: Vascular;  Laterality: Left;  . FISTULOGRAM Left 01/31/2014   Procedure: FISTULOGRAM;  Surgeon: Conrad [place], MD;  Location: [place];  Service: Vascular;  Laterality: Left;  . LIGATION OF COMPETING BRANCHES OF ARTERIOVENOUS FISTULA Left 01/31/2014   Procedure: REVISION OF ARTERIOVENOUS FISTULA;  Surgeon: Conrad [place], MD;  Location: [facility];  Service: Vascular;  Laterality: Left;   HPI:  78 year old male presented with altered mental status from SNF.  He has a significant past medical history of dementia, hypertension, type II DM, coronary disease, diastolic heart failure, hemodialysis (MWF).  He was noted to have worsening mental status at the nursing facility and he was sent to the hospital.  He had a recent hospitalization for COVID-19 infection (03/22/19 to 03/25/19).  Patient was admitted to the hospital with working diagnosis of acute hypoxic respiratory failure due to SARS COVID-19 pneumonia.   Assessment / Plan /  Recommendation Clinical Impression  Pt demosntrates cognitive impairment impacting acceptance of PO. He is distressed and uncomfortable, resistant to any intervention. He turns head away from sprite/straw. When a piece of ice was touched to his mouth he accepted it and masticated it fairly automatically. He would not allow palpation to assess if laryngeal elevation was present, but pt appeared to swallow. Suspect ability to swallow intact, though given dry oral mucosa a regular texture diet may not be realistic until mentation improves. Will downgrade to dys 2/thin.  SLP Visit Diagnosis: Dysphagia, unspecified (R13.10)    Aspiration Risk  Risk for inadequate nutrition/hydration    Diet Recommendation Dysphagia 2 (Fine chop);Thin liquid   Liquid Administration via: Cup;Straw Medication Administration: Whole meds with liquid Postural Changes: Remain upright for at least 30 minutes after po intake    Other  Recommendations     Follow up Recommendations Skilled Nursing facility      Frequency and Duration min 1 x/week  2 weeks       Prognosis Prognosis for Safe Diet Advancement: Good      Swallow Study   General HPI: 78 year old male presented with altered mental status from SNF.  He has a significant past medical history of dementia, hypertension, type II DM, coronary disease, diastolic heart failure, hemodialysis (MWF).  He was noted to have worsening mental status at the nursing facility and he was sent to the hospital.  He had a recent hospitalization for COVID-19 infection (03/22/19 to 03/25/19).  Patient  was admitted to the hospital with working diagnosis of acute hypoxic respiratory failure due to SARS COVID-19 pneumonia. Type of Study: Bedside Swallow Evaluation Diet Prior to this Study: Regular;Thin liquids Temperature Spikes Noted: No Respiratory Status: Room air History of Recent Intubation: No Behavior/Cognition: Alert;Doesn't follow directions;Uncooperative Oral Cavity  Assessment: Dry Oral Care Completed by SLP: No Oral Cavity - Dentition: Poor condition Self-Feeding Abilities: Total assist Patient Positioning: Upright in bed Baseline Vocal Quality: Not observed Volitional Cough: Congested;Weak Volitional Swallow: Unable to elicit    Oral/Motor/Sensory Function Overall Oral Motor/Sensory Function: Other (comment)(does not follow oral motor commands)   Ice Chips Ice chips: Within functional limits Presentation: Spoon   Thin Liquid Thin Liquid: Not tested    Nectar Thick Nectar Thick Liquid: Not tested   Honey Thick Honey Thick Liquid: Not tested   Puree Puree: Not tested   Solid           Herbie [place], MA CCC-SLP  Acute Rehabilitation Services Pager [phone] Office [phone]  Lynann Beaver 03/31/2019,1:21 PM

## 2019-03-31 NOTE — Plan of Care (Signed)

## 2019-04-01 LAB — RENAL FUNCTION PANEL
Albumin: 2.2 g/dL — ABNORMAL LOW (ref 3.5–5.0)
Anion gap: 19 — ABNORMAL HIGH (ref 5–15)
BUN: 67 mg/dL — ABNORMAL HIGH (ref 8–23)
CO2: 22 mmol/L (ref 22–32)
Calcium: 7.2 mg/dL — ABNORMAL LOW (ref 8.9–10.3)
Chloride: 100 mmol/L (ref 98–111)
Creatinine, Ser: 10.96 mg/dL — ABNORMAL HIGH (ref 0.61–1.24)
GFR calc Af Amer: 5 mL/min — ABNORMAL LOW (ref >60)
GFR calc non Af Amer: 4 mL/min — ABNORMAL LOW (ref >60)
Glucose, Bld: 111 mg/dL — ABNORMAL HIGH (ref 70–99)
Phosphorus: 6.4 mg/dL — ABNORMAL HIGH (ref 2.5–4.6)
Potassium: 3.9 mmol/L (ref 3.5–5.1)
Sodium: 141 mmol/L (ref 135–145)

## 2019-04-01 LAB — GLUCOSE, CAPILLARY
Glucose-Capillary: 99 mg/dL (ref 70–99)
Glucose-Capillary: 120 mg/dL — ABNORMAL HIGH (ref 70–99)
Glucose-Capillary: 106 mg/dL — ABNORMAL HIGH (ref 70–99)
Glucose-Capillary: 94 mg/dL (ref 70–99)

## 2019-04-01 LAB — CBC
HCT: 26.7 % — ABNORMAL LOW (ref 39.0–52.0)
Hemoglobin: 8.7 g/dL — ABNORMAL LOW (ref 13.0–17.0)
MCH: 29.3 pg (ref 26.0–34.0)
MCHC: 32.6 g/dL (ref 30.0–36.0)
MCV: 89.9 fL (ref 80.0–100.0)
Platelets: 172 10*3/uL (ref 150–400)
RBC: 2.97 MIL/uL — ABNORMAL LOW (ref 4.22–5.81)
RDW: 14.4 % (ref 11.5–15.5)
WBC: 6.2 10*3/uL (ref 4.0–10.5)
nRBC: 0.3 % — ABNORMAL HIGH (ref 0.0–0.2)

## 2019-04-01 MED ORDER — SODIUM CHLORIDE 0.9 % IV SOLN: 100.0000 mL | INTRAVENOUS | Status: DC | PRN

## 2019-04-01 MED ORDER — ALTEPLASE 2 MG IJ SOLR: 2.0000 mg | Freq: Once | INTRAMUSCULAR | Status: DC | PRN

## 2019-04-01 MED ORDER — LIDOCAINE-PRILOCAINE 2.5-2.5 % EX CREA
1.0000 "application " | TOPICAL_CREAM | CUTANEOUS | Status: DC | PRN
  Filled 2019-04-01: qty 5

## 2019-04-01 MED ORDER — PENTAFLUOROPROP-TETRAFLUOROETH EX AERO: 1.0000 "application " | INHALATION_SPRAY | CUTANEOUS | Status: DC | PRN

## 2019-04-01 MED ORDER — LIDOCAINE HCL (PF) 1 % IJ SOLN: 5.0000 mL | INTRAMUSCULAR | Status: DC | PRN

## 2019-04-01 MED ORDER — HEPARIN SODIUM (PORCINE) 1000 UNIT/ML DIALYSIS
20.0000 [IU]/kg | INTRAMUSCULAR | Status: DC | PRN
  Filled 2019-04-01: qty 2

## 2019-04-01 MED ORDER — ENSURE ENLIVE PO LIQD
237.0000 mL | Freq: Two times a day (BID) | ORAL | Status: DC
  Administered 2019-04-01 – 2019-04-02 (×2): 237 mL via ORAL

## 2019-04-01 MED ORDER — HEPARIN SODIUM (PORCINE) 1000 UNIT/ML DIALYSIS
40.0000 [IU]/kg | INTRAMUSCULAR | Status: DC | PRN
  Filled 2019-04-01: qty 3

## 2019-04-01 MED ORDER — HEPARIN SODIUM (PORCINE) 1000 UNIT/ML DIALYSIS
1000.0000 [IU] | INTRAMUSCULAR | Status: DC | PRN
  Filled 2019-04-01: qty 1

## 2019-04-01 NOTE — Progress Notes (Addendum)
PROGRESS NOTE    Lucas Calderon  PXT:[id number] DOB: 03-Jun-1941 DOA: 04/13/2019 PCP: Maryella Shivers, MD    Brief Narrative:  78 year old male who presented with altered mental status,transfer from nursing facility. 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 He had a recent hospitalization for COVID-19 infection (03/22/19 to 03/25/19). On his initial physical examination his temperature was 99.2, heart rate 96, respiratory 16, blood pressure 119/70, oxygenation 95% on room air. His lungs had rales at the left lung base, right lung field, no wheezing, heart S1-S2 present rhythm, abdomen soft, no lower extremity edema. SARS COVID-19 was positive. Chest x-ray had a new right upper lobe infiltrate.  Patient was admitted to the hospital working diagnosis of acute hypoxic respiratory failure due to SARS COVID-19 pneumonia  No clinical signs of viral pneumonia, no respiratory distress or significant oxygen desaturation. Currently treated for aspiration pneumonia with IV ceftriaxone.   Mentation has slowly improved but continue with no oral intake. Patient continue to be very weak and deconditioned, no adequate po intake. Poor prognosis.    Assessment & Plan:   Principal Problem:   COVID-19 Active Problems:   HTN (hypertension)   Anemia in CKD (chronic kidney disease)   Diabetes mellitus, type 2 (HCC)   End stage renal disease ([place])   HCAP (healthcare-associated pneumonia)   Pressure injury of skin    1. Acute hypoxic respiratory failure, right upper lobe, aspiration pneumonia, present on admission,in the setting ofCOVID 19infection. No clinical signs of viral pneumonia.Blood culture positive for coagulase negative staph, likely a  contamination. Oxygenation is 92% on room air. On IV ceftriaxone # 4. Patient very debilitated and with poor oral intake. Poor prognosis.   2. Metabolic encephalopathy/ in the setting of dementia. Patient only grimaces to touch, not verbal or interactive, not in pain or dyspnea. No nausea or vomiting. Positive cognitive decline over time, even before COVID infection. Continue mirtazapine. Taking intermittent oral medications. I spoke with his sister about patient's condition and quality of life, she agrees in consulting palliative care team.   3. ESRD on HD. Case discussed with Dr. Posey Pronto from nephrology, patient not good candidate to continue HD due to progressive cognitive decline, will consult palliative care team.  Continue, renvela phoslo and vitamin D 3.   4. Chronic anemia. Stable.  5. Htn.This am systolic blood pressure 91 mmHg will continue to hold blood pressure medications including carvedilol, and isosorbide.  6. T2DM.Glucose this am is 111 mg/dl. Will continue with very poor oral intake. On insulin sliding scale for glucose cover and monitoring.  7. Perineum stage 2 pressure ulcer present on admission. Continue with local wound care.   DVT prophylaxis:heparin Code Status:full Family Communication:no family at the bedside Disposition Plan/ discharge barriers:poor prognosis, will consult palliative care team for evaluation.      Body mass index is 19.44 kg/m. Malnutrition Type:      Malnutrition Characteristics:      Nutrition Interventions:     RN Pressure Injury Documentation: Pressure Injury 03/24/2019 Perineum Bilateral Stage II -  Partial thickness loss of dermis presenting as a shallow open ulcer with a red, pink wound bed without slough. Pink wound bed (Active)  03/21/2019 2230  Location: Perineum  Location Orientation: Bilateral  Staging: Stage II -  Partial thickness loss of dermis presenting  as a shallow open ulcer with a red, pink wound  bed without slough.  Wound Description (Comments): Pink wound bed  Present on Admission: Yes     Consultants:     Procedures:     Antimicrobials:   Ceftriaxone #4/8    Subjective: Patient continue to be not interactive, not in pain, or apparent dyspnea, continue to have very poor oral intake, no nausea or vomiting.   Objective: Vitals:   03/31/19 1540 03/31/19 2140 03/31/19 2141 04/01/19 0900  BP: (!) 149/133 (!) 141/53 (!) 141/53 91/69  Pulse: 91 89 89 93  Resp:  18  17  Temp: 98.8 F (37.1 C) 99.9 F (37.7 C) 99.9 F (37.7 C) 98.3 F (36.8 C)  TempSrc: Axillary Axillary  Axillary  SpO2: 91% 92%  100%  Weight:      Height:        Intake/Output Summary (Last 24 hours) at 04/01/2019 0928 Last data filed at 03/31/2019 1100 Gross per 24 hour  Intake 200 ml  Output -  Net 200 ml   Filed Weights   03/30/19 0500 03/30/19 2140 03/31/19 0045  Weight: 60.1 kg 59 kg 58 kg    Examination:   General: deconditioned and ill looking appearing.  Neurology: somnolent, only grimaces to pain, not following commands or answering to questions  E ENT: positive pallor, no icterus, oral mucosa  Is dry.  Cardiovascular: No JVD. S1-S2 present, rhythmic, no gallops, rubs, or murmurs. No lower extremity edema. Pulmonary: positive breath sounds bilaterally, decreased air movement, no wheezing, rhonchi or rales. Gastrointestinal. Abdomen with no organomegaly, non tender, no rebound or guarding Skin. No rashes Musculoskeletal: no joint deformities     Data Reviewed: I have personally reviewed following labs and imaging studies  CBC: Recent Labs  Lab 04/07/2019 1433 03/29/19 0448 03/30/19 0431 03/31/19 0528 04/01/19 0614  WBC 8.4 6.8 7.3 8.0 6.2  NEUTROABS 6.8  --  6.4 6.8  --   HGB 9.4* 8.8* 8.9* 9.0* 8.7*  HCT 30.1* 27.2* 26.5* 27.2* 26.7*  MCV 92.3 90.1 88.0 89.2 89.9  PLT 186 174 165 204 619   Basic Metabolic Panel: Recent Labs  Lab 03/25/2019 1433 03/29/19 0448  03/30/19 0431 03/31/19 0528 04/01/19 0614  NA 144 143 138 139 141  K 5.0 4.8 4.0 4.3 3.9  CL 101 103 96* 98 100  CO2 21* 17* 21* 23 22  GLUCOSE 211* 168* 160* 138* 111*  BUN 89* 98* 40* 48* 67*  CREATININE 16.72* 17.10* 8.96* 7.95* 10.96*  CALCIUM 8.2* 7.7* 7.2* 7.4* 7.2*  MG 2.7*  --   --   --   --   PHOS  --   --   --   --  6.4*   GFR: Estimated Creatinine Clearance: 4.8 mL/min (A) (by C-G formula based on SCr of 10.96 mg/dL (H)). Liver Function Tests: Recent Labs  Lab 03/27/2019 1433 03/29/19 0448 04/01/19 0614  AST 48* 42*  --   ALT 28 25  --   ALKPHOS 45 46  --   BILITOT 0.9 0.9  --   PROT 7.4 7.1  --   ALBUMIN 2.6* 2.3* 2.2*   Recent Labs  Lab 03/29/2019 1433  LIPASE 81*   Recent Labs  Lab 04/03/2019 1433  AMMONIA 24   Coagulation Profile: No results for input(s): INR, PROTIME in the last 168 hours. Cardiac Enzymes: No results for input(s): CKTOTAL, CKMB, CKMBINDEX, TROPONINI in the last 168 hours. BNP (last 3 results) No results for  input(s): PROBNP in the last 8760 hours. HbA1C: No results for input(s): HGBA1C in the last 72 hours. CBG: Recent Labs  Lab 03/31/19 0933 03/31/19 1142 03/31/19 1537 03/31/19 2119 04/01/19 0854  GLUCAP 145* 144* 155* 126* 99   Lipid Profile: No results for input(s): CHOL, HDL, LDLCALC, TRIG, CHOLHDL, LDLDIRECT in the last 72 hours. Thyroid Function Tests: No results for input(s): TSH, T4TOTAL, FREET4, T3FREE, THYROIDAB in the last 72 hours. Anemia Panel: No results for input(s): VITAMINB12, FOLATE, FERRITIN, TIBC, IRON, RETICCTPCT in the last 72 hours.    Radiology Studies: I have reviewed all of the imaging during this hospital visit personally     Scheduled Meds: . calcium acetate  1,334 mg Oral TID  . Chlorhexidine Gluconate Cloth  6 each Topical Q0600  . cholecalciferol  2,000 Units Oral Daily  . feeding supplement (PRO-STAT SUGAR FREE 64)  30 mL Oral BID  . heparin  5,000 Units Subcutaneous Q8H  .  insulin aspart  0-5 Units Subcutaneous QHS  . insulin aspart  0-9 Units Subcutaneous TID WC  . mirtazapine  7.5 mg Oral QHS  . pantoprazole (PROTONIX) IV  40 mg Intravenous Q24H  . sevelamer carbonate  2,400 mg Oral TID   Continuous Infusions: . cefTRIAXone (ROCEPHIN)  IV 1 g (04/01/19 0900)     LOS: 4 days        Abyan Cadman Gerome Apley, MD

## 2019-04-01 NOTE — Progress Notes (Signed)
Patient ID: Lucas Calderon, male   DOB: Dec 10, 1940, 78 y.o.   MRN: [id number] [address] [facility] Progress Note   Assessment/ Plan:   1. COVID positive/RUL infiltrate -with right upper lobe infiltrate, on Decadron and coverage for HCAP with ceftriaxone. 2. ESRD -continue hemodialysis on a TTS schedule, he continues to remain with significant encephalopathy/he is refusing food and has minimal verbal responses-this is unchanged since his admission to the hospital.  Unfortunately, I fear it is time to involve the palliative care service again to readdress goals of care as continuing aggressive management including dialysis appears to be futile at this time. 3. Hypertension/volume  -he appears to be euvolemic on exam with limited oral intake.  Minimal ultrafiltration with dialysis. 4. Anemia  -continue ESA for anemia of chronic disease/acute illness. 5. Metabolic bone disease -  currently he has limited oral intake, not on VDRA 6. Nutrition -continue efforts at encouraging dietary intake/nutritional supplementation 7. DM - meds stopped last admission 8. Dementia -present at baseline, suspect vascular versus age-related.  Unfortunately, has shown decline over the past few weeks and we need to invoke assistance from palliative care service to help guide goals of care.  Subjective:   Without acute events overnight, continues to refuse medications/oral intake of meals.   Objective:   BP (!) 141/53 (BP Location: Right Leg)   Pulse 89   Temp 99.9 F (37.7 C)   Resp 18   Ht 5\' 8"  (1.727 m)   Wt 58 kg   SpO2 92% Comment: Simultaneous filing. User may not have seen previous data.  BMI 19.44 kg/m   Physical Exam: Gen: Awake, attempt to track around the room but without verbal responses CVS: Pulse regular rhythm, normal rate, S1 and S2 normal Resp: Clear to auscultation, no rales/rhonchi Abd: Soft, flat, nontender, bowel sounds normal Ext: No lower extremity edema, left radiocephalic fistula  with intact dressings  Labs: BMET Recent Labs  Lab 03/27/2019 1433 03/29/19 0448 03/30/19 0431 03/31/19 0528 04/01/19 0614  NA 144 143 138 139 141  K 5.0 4.8 4.0 4.3 3.9  CL 101 103 96* 98 100  CO2 21* 17* 21* 23 22  GLUCOSE 211* 168* 160* 138* 111*  BUN 89* 98* 40* 48* 67*  CREATININE 16.72* 17.10* 8.96* 7.95* 10.96*  CALCIUM 8.2* 7.7* 7.2* 7.4* 7.2*  PHOS  --   --   --   --  6.4*   CBC Recent Labs  Lab 04/13/2019 1433 03/29/19 0448 03/30/19 0431 03/31/19 0528 04/01/19 0614  WBC 8.4 6.8 7.3 8.0 6.2  NEUTROABS 6.8  --  6.4 6.8  --   HGB 9.4* 8.8* 8.9* 9.0* 8.7*  HCT 30.1* 27.2* 26.5* 27.2* 26.7*  MCV 92.3 90.1 88.0 89.2 89.9  PLT 186 174 165 204 172   Medications:    . calcium acetate  1,334 mg Oral TID  . Chlorhexidine Gluconate Cloth  6 each Topical Q0600  . cholecalciferol  2,000 Units Oral Daily  . feeding supplement (PRO-STAT SUGAR FREE 64)  30 mL Oral BID  . heparin  5,000 Units Subcutaneous Q8H  . insulin aspart  0-5 Units Subcutaneous QHS  . insulin aspart  0-9 Units Subcutaneous TID WC  . mirtazapine  7.5 mg Oral QHS  . pantoprazole (PROTONIX) IV  40 mg Intravenous Q24H  . sevelamer carbonate  2,400 mg Oral TID   Elmarie Shiley, MD 04/01/2019, 8:56 AM

## 2019-04-02 ENCOUNTER — Encounter (HOSPITAL_COMMUNITY): Payer: Self-pay | Admitting: Primary Care

## 2019-04-02 DIAGNOSIS — N186 End stage renal disease: Secondary | ICD-10-CM

## 2019-04-02 DIAGNOSIS — Z7189 Other specified counseling: Secondary | ICD-10-CM

## 2019-04-02 DIAGNOSIS — J988 Other specified respiratory disorders: Secondary | ICD-10-CM

## 2019-04-02 DIAGNOSIS — U071 COVID-19: Principal | ICD-10-CM

## 2019-04-02 DIAGNOSIS — G9341 Metabolic encephalopathy: Secondary | ICD-10-CM

## 2019-04-02 DIAGNOSIS — Z515 Encounter for palliative care: Secondary | ICD-10-CM

## 2019-04-02 LAB — BASIC METABOLIC PANEL
Anion gap: 21 — ABNORMAL HIGH (ref 5–15)
BUN: 65 mg/dL — ABNORMAL HIGH (ref 8–23)
CO2: 20 mmol/L — ABNORMAL LOW (ref 22–32)
Calcium: 7.4 mg/dL — ABNORMAL LOW (ref 8.9–10.3)
Chloride: 101 mmol/L (ref 98–111)
Creatinine, Ser: 11.08 mg/dL — ABNORMAL HIGH (ref 0.61–1.24)
GFR calc Af Amer: 5 mL/min — ABNORMAL LOW (ref >60)
GFR calc non Af Amer: 4 mL/min — ABNORMAL LOW (ref >60)
Glucose, Bld: 96 mg/dL (ref 70–99)
Potassium: 4.2 mmol/L (ref 3.5–5.1)
Sodium: 142 mmol/L (ref 135–145)

## 2019-04-02 LAB — CULTURE, BLOOD (ROUTINE X 2)
Culture: NO GROWTH
Special Requests: ADEQUATE

## 2019-04-02 LAB — GLUCOSE, CAPILLARY
Glucose-Capillary: 89 mg/dL (ref 70–99)
Glucose-Capillary: 89 mg/dL (ref 70–99)

## 2019-04-02 MED ORDER — PANTOPRAZOLE SODIUM 40 MG PO TBEC: 40.0000 mg | DELAYED_RELEASE_TABLET | Freq: Every day | ORAL | Status: DC

## 2019-04-02 MED ORDER — POLYVINYL ALCOHOL 1.4 % OP SOLN
2.0000 [drp] | OPHTHALMIC | Status: DC | PRN
  Administered 2019-04-02: 2 [drp] via OPHTHALMIC
  Filled 2019-04-02: qty 15

## 2019-04-02 MED ORDER — LORAZEPAM 2 MG/ML IJ SOLN
0.5000 mg | Freq: Four times a day (QID) | INTRAMUSCULAR | Status: DC | PRN
  Administered 2019-04-06: 0.5 mg via INTRAVENOUS
  Filled 2019-04-02: qty 1

## 2019-04-02 MED ORDER — FENTANYL CITRATE (PF) 100 MCG/2ML IJ SOLN: 12.5000 ug | INTRAMUSCULAR | Status: DC | PRN

## 2019-04-02 NOTE — TOC Initial Note (Signed)
Transition of Care [facility]) - Initial/Assessment Note    Patient Details  Name: Lucas Calderon MRN: [id number] Date of Birth: 24-Dec-1940  Transition of [facility]) CM/SW Contact:    Gelene Mink, [place] Phone Number: 04/02/2019, 3:35 PM  Clinical Narrative:                  CSW received consult from PMT that the family would like to transition the patient to comfort care and use [facility].   CSW contacted Diane with [facility]. CSW made the referral. Diane requested clinicals to be faxed to her so that she could start the referral process. Diane stated she would reach out to the family. Diane stated she would be in touch when a bed became available.   CSW will continue to follow and assist with disposition planning.   Expected Discharge Plan: [place] Barriers to Discharge: Hospice Bed not available   Patient Goals and CMS Choice Patient states their goals for this hospitalization and ongoing recovery are:: Pt family would like to transition to comfort care CMS Medicare.gov Compare Post Acute Care list provided to:: Patient Represenative (must comment) Choice offered to / list presented to : Sibling  Expected Discharge Plan and Services Expected Discharge Plan: [place] In-house Referral: Clinical Social Work Discharge Planning Services: NA Post Acute Care Choice: Hospice Living arrangements for the past 2 months: [place]                 DME Arranged: N/A DME Agency: NA       HH Arranged: NA [place] Agency: NA        Prior Living Arrangements/Services Living arrangements for the past 2 months: [place] Lives with:: Facility Resident Patient language and need for interpreter reviewed:: No        Need for Family Participation in Patient Care: Yes (Comment) Care giver support system in place?: Yes (comment)   Criminal Activity/Legal Involvement Pertinent to Current  Situation/Hospitalization: No - Comment as needed  Activities of Daily Living Home Assistive Devices/Equipment: None ADL Screening (condition at time of admission) Patient's cognitive ability adequate to safely complete daily activities?: No Is the patient deaf or have difficulty hearing?: No Does the patient have difficulty seeing, even when wearing glasses/contacts?: No Does the patient have difficulty concentrating, remembering, or making decisions?: Yes Patient able to express need for assistance with ADLs?: No Does the patient have difficulty dressing or bathing?: Yes Independently performs ADLs?: No Communication: Dependent Is this a change from baseline?: Pre-admission baseline Dressing (OT): Dependent Is this a change from baseline?: Pre-admission baseline Grooming: Dependent Is this a change from baseline?: Pre-admission baseline Feeding: Dependent Is this a change from baseline?: Pre-admission baseline Bathing: Dependent Is this a change from baseline?: Pre-admission baseline Toileting: Dependent Is this a change from baseline?: Pre-admission baseline In/Out Bed: Dependent Is this a change from baseline?: Pre-admission baseline Does the patient have difficulty walking or climbing stairs?: Yes Weakness of Legs: Both Weakness of Arms/Hands: Both  Permission Sought/Granted Permission sought to share information with : Case Manager Permission granted to share information with : Yes, Verbal Permission Granted  Share Information with NAME: [name]  Permission granted to share info w AGENCY: [facility] granted to share info w Relationship: Sister     Emotional Assessment Appearance:: Appears stated age Attitude/Demeanor/Rapport: Unable to Assess Affect (typically observed): Unable to Assess Orientation: : Fluctuating Orientation (Suspected and/or reported Sundowners) Alcohol /  Substance Use: Not Applicable Psych Involvement: No  (comment)  Admission diagnosis:  Altered mental status, unspecified altered mental status type [R41.82] COVID-19 [U07.1, J98.8] Patient Active Problem List   Diagnosis Date Noted  . Goals of care, counseling/discussion   . Palliative care by specialist   . DNR (do not resuscitate) discussion   . Encounter for hospice care discussion   . COVID-19 03/29/2019  . Pressure injury of skin 03/29/2019  . HCAP (healthcare-associated pneumonia) 04/10/2019  . COVID-19 virus detected 03/22/2019  . End stage renal disease ([place]) 11/03/2013  . HTN (hypertension) 09/26/2013  . Anemia in CKD (chronic kidney disease) 09/26/2013  . Diabetes mellitus, type 2 ([place]) 09/26/2013   PCP:  Maryella Shivers, MD Pharmacy:   [place], [place] - [address] [place] [address] Phone: [phone] Fax: [phone]     Social Determinants of Health (SDOH) Interventions    Readmission Risk Interventions No flowsheet data found.

## 2019-04-02 NOTE — Progress Notes (Signed)
PROGRESS NOTE    Lucas Calderon  AVW:[id number] DOB: 1940-11-23 DOA: 04/03/2019 PCP: Maryella Shivers, MD    Brief Narrative:  78 year old male who presented with altered mental status,transfer fromnursing facility. 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 He had a recent hospitalization for COVID-19 infection (03/22/19 to 03/25/19). On his initial physical examination his temperature was 99.2, heart rate 96, respiratory 16, blood pressure 119/70, oxygenation 95% on room air. His lungs had rales at the left lung base, right lung field, no wheezing, heart S1-S2 present rhythm, abdomen soft, no lower extremity edema. SARS COVID-19 was positive. Chest x-ray had a new right upper lobe infiltrate.  Patient was admitted to the hospital working diagnosis of acute hypoxic respiratory failure due to SARS COVID-19 pneumonia  No clinical signs of viral pneumonia, no respiratory distress or significant oxygen desaturation. Currently treated for aspiration pneumonia with IV ceftriaxone.   Mentation has slowly improved but continue with no oral intake.Patient continue to be very weak and deconditioned, no adequate po intake. Poor prognosis.     Assessment & Plan:   Principal Problem:   COVID-19 Active Problems:   HTN (hypertension)   Anemia in CKD (chronic kidney disease)   Diabetes mellitus, type 2 (HCC)   End stage renal disease ([place])   HCAP (healthcare-associated pneumonia)   Pressure injury of skin    1. Acute hypoxic respiratory failure, right upper lobe, aspiration pneumonia, present on admission,in the setting ofCOVID 19infection.No clinical signs of viral pneumonia.Blood culture positive for coagulase negative staph, likely a  contamination. Oxygenation saturation is 100 on 2 LPM per [place], continue with IV ceftriaxone # 5/8.   2. Metabolic encephalopathy/ in the setting of dementia. Patient's cognitive function with no recovery, continue to be not interactive, only grimacing to touch, non verbal and very poor oral intake. Apparently he does have baseline dementia. He has a poor prognosis, consulted palliative care team for recommendations.    3. ESRD on HD. On renvela phoslo and vitamin D 3. Patient had hemodialysis and ultrafiltration yesterday, today patient euvolemic to slight hypovolemic. Progressive cognitive decline and poor prognosis.   4. Chronic anemia. Stable.  5. Htn.Blood pressure today 135/60 mmHg. Continue to hold on carvedilol, and isosorbide.  6. T2DM with hypoglycemia.Capillary glucose has been low, 106, 94, 89, 89, 96. Will hold on insulin therapy for now due to risk of worsening hypoglycemia, will continue daily capillary glucose monitoring for now.   7. Perineum stage 2 pressure ulcer present on admission. Local wound care.He has been non ambulatory, very poor prognosis.   DVT prophylaxis:heparin Code Status:full Family Communication:no family at the bedside Disposition Plan/ discharge barriers:pending palliative care consultation.      Body mass index is 19.24 kg/m. Malnutrition Type:      Malnutrition Characteristics:      Nutrition Interventions:     RN Pressure Injury Documentation: Pressure Injury 04/05/2019 Perineum Bilateral Stage II -  Partial thickness loss of dermis presenting as a shallow open ulcer with a red, pink wound bed without slough. Pink wound bed (Active)  03/27/2019 2230  Location: Perineum  Location Orientation: Bilateral  Staging: Stage II -  Partial thickness loss of dermis presenting as a shallow open ulcer with a red, pink wound bed without slough.  Wound Description (Comments): Pink wound bed  Present on Admission: Yes  Consultants:   Nephrology  Palliative Care team.   Procedures:     Antimicrobials:       Subjective: Patient continue to have very poor oral intake, not interactive, not following commands. I spoke with his sister yesterday and agrees for palliative care consult.   Objective: Vitals:   04/01/19 1816 04/01/19 2028 04/02/19 0640 04/02/19 0745  BP: (!) 120/48 (!) 98/57 (!) 130/58 135/60  Pulse: 93 96 92 93  Resp:  16 12 13   Temp: 99.2 F (37.3 C) 99.1 F (37.3 C) 98.9 F (37.2 C) 98.7 F (37.1 C)  TempSrc: Axillary Oral Oral Axillary  SpO2: 98% 100% 100% 100%  Weight:      Height:        Intake/Output Summary (Last 24 hours) at 04/02/2019 0859 Last data filed at 04/02/2019 0800 Gross per 24 hour  Intake 50 ml  Output 500 ml  Net -450 ml   Filed Weights   03/31/19 0045 04/01/19 1505 04/01/19 1815  Weight: 58 kg 57.9 kg 57.4 kg    Examination:   General: deconditioned and ill looking appearing.  Neurology: Somnolent, only grimaces to touch, non verbal and not following commands.   E ENT: positive pallor, no icterus, oral mucosa is dry.  Cardiovascular: No JVD. S1-S2 present, rhythmic, no gallops, rubs, or murmurs. No lower extremity edema. Pulmonary: positive breath sounds bilaterally, no wheezing, rhonchi or rales. Gastrointestinal. Abdomen with no organomegaly, non tender, no rebound or guarding Skin. No rashes Musculoskeletal: no joint deformities     Data Reviewed: I have personally reviewed following labs and imaging studies  CBC: Recent Labs  Lab 04/07/2019 1433 03/29/19 0448 03/30/19 0431 03/31/19 0528 04/01/19 0614  WBC 8.4 6.8 7.3 8.0 6.2  NEUTROABS 6.8  --  6.4 6.8  --   HGB 9.4* 8.8* 8.9* 9.0* 8.7*  HCT 30.1* 27.2* 26.5* 27.2* 26.7*  MCV 92.3 90.1 88.0 89.2 89.9  PLT 186 174 165 204 700   Basic Metabolic Panel: Recent Labs  Lab 04/09/2019 1433 03/29/19 0448 03/30/19 0431 03/31/19 0528 04/01/19 0614 04/02/19 0756  NA 144 143 138  139 141 142  K 5.0 4.8 4.0 4.3 3.9 4.2  CL 101 103 96* 98 100 101  CO2 21* 17* 21* 23 22 20*  GLUCOSE 211* 168* 160* 138* 111* 96  BUN 89* 98* 40* 48* 67* 65*  CREATININE 16.72* 17.10* 8.96* 7.95* 10.96* 11.08*  CALCIUM 8.2* 7.7* 7.2* 7.4* 7.2* 7.4*  MG 2.7*  --   --   --   --   --   PHOS  --   --   --   --  6.4*  --    GFR: Estimated Creatinine Clearance: 4.7 mL/min (A) (by C-G formula based on SCr of 11.08 mg/dL (H)). Liver Function Tests: Recent Labs  Lab 03/22/2019 1433 03/29/19 0448 04/01/19 0614  AST 48* 42*  --   ALT 28 25  --   ALKPHOS 45 46  --   BILITOT 0.9 0.9  --   PROT 7.4 7.1  --   ALBUMIN 2.6* 2.3* 2.2*   Recent Labs  Lab 04/13/2019 1433  LIPASE 81*   Recent Labs  Lab 03/23/2019 1433  AMMONIA 24   Coagulation Profile: No results for input(s): INR, PROTIME in the last 168 hours. Cardiac Enzymes: No results for input(s): CKTOTAL, CKMB, CKMBINDEX, TROPONINI in the last 168 hours. BNP (last 3 results) No results for input(s): PROBNP in the last 8760 hours. HbA1C: No results for  input(s): HGBA1C in the last 72 hours. CBG: Recent Labs  Lab 04/01/19 1227 04/01/19 1523 04/01/19 2043 04/02/19 0457 04/02/19 0745  GLUCAP 120* 106* 94 89 89   Lipid Profile: No results for input(s): CHOL, HDL, LDLCALC, TRIG, CHOLHDL, LDLDIRECT in the last 72 hours. Thyroid Function Tests: No results for input(s): TSH, T4TOTAL, FREET4, T3FREE, THYROIDAB in the last 72 hours. Anemia Panel: No results for input(s): VITAMINB12, FOLATE, FERRITIN, TIBC, IRON, RETICCTPCT in the last 72 hours.    Radiology Studies: I have reviewed all of the imaging during this hospital visit personally     Scheduled Meds: . calcium acetate  1,334 mg Oral TID  . Chlorhexidine Gluconate Cloth  6 each Topical Q0600  . cholecalciferol  2,000 Units Oral Daily  . feeding supplement (ENSURE ENLIVE)  237 mL Oral BID BM  . feeding supplement (PRO-STAT SUGAR FREE 64)  30 mL Oral BID  . heparin   5,000 Units Subcutaneous Q8H  . insulin aspart  0-5 Units Subcutaneous QHS  . insulin aspart  0-9 Units Subcutaneous TID WC  . mirtazapine  7.5 mg Oral QHS  . pantoprazole (PROTONIX) IV  40 mg Intravenous Q24H  . sevelamer carbonate  2,400 mg Oral TID   Continuous Infusions: . sodium chloride    . sodium chloride    . cefTRIAXone (ROCEPHIN)  IV 1 g (04/01/19 0900)     LOS: 5 days        Chrisean Kloth Gerome Apley, MD

## 2019-04-02 NOTE — Progress Notes (Signed)
Patient ID: Lucas Calderon, male   DOB: 1941/04/27, 78 y.o.   MRN: [id number] [facility] Progress Note   Assessment/ Plan:   1. COVID positive/RUL infiltrate -with right upper lobe infiltrate, on Decadron and coverage for HCAP with ceftriaxone. 2. ESRD -previously on TTS schedule dialysis for hemodialysis and underwent treatment yesterday.  Unfortunately, his encephalopathy and failure to thrive continues to persist and direction of care now being focused towards comfort measures rather than aggressive care that is limited by his nutritional status/intake of medications. 3. Hypertension/volume  -he appears to be euvolemic on exam with limited oral intake.  Kept even with hemodialysis. 4. Anemia  -continue ESA for anemia of chronic disease/acute illness. 5. Metabolic bone disease -  currently he has limited oral intake, not on VDRA 6. Nutrition -continue efforts at encouraging dietary intake/nutritional supplementation 7. DM - meds stopped last admission 8. Dementia -present at baseline, suspect vascular versus age-related.  Unfortunately, has shown decline over the past few weeks and awaiting palliative care consultation to help assist with transition towards comfort care.  Subjective:   Without acute events overnight, somnolent and continues to refuse oral intake.  Yesterday, Dr.Arrien spoke to the patient's sister and consequently getting palliative care consultation.   Objective:   BP 135/60 (BP Location: Left Leg)   Pulse 93   Temp 98.7 F (37.1 C) (Axillary)   Resp 13   Ht 5\' 8"  (1.727 m)   Wt 57.4 kg   SpO2 100%   BMI 19.24 kg/m   Physical Exam: Gen: Somnolent, does not awaken to voice/touch CVS: Pulse regular rhythm, normal rate, S1 and S2 normal Resp: Clear to auscultation, no rales/rhonchi Abd: Soft, flat, nontender, bowel sounds normal Ext: No lower extremity edema, left radiocephalic fistula with intact dressings  Labs: BMET Recent Labs  Lab  04/15/2019 1433 03/29/19 0448 03/30/19 0431 03/31/19 0528 04/01/19 0614 04/02/19 0756  NA 144 143 138 139 141 142  K 5.0 4.8 4.0 4.3 3.9 4.2  CL 101 103 96* 98 100 101  CO2 21* 17* 21* 23 22 20*  GLUCOSE 211* 168* 160* 138* 111* 96  BUN 89* 98* 40* 48* 67* 65*  CREATININE 16.72* 17.10* 8.96* 7.95* 10.96* 11.08*  CALCIUM 8.2* 7.7* 7.2* 7.4* 7.2* 7.4*  PHOS  --   --   --   --  6.4*  --    CBC Recent Labs  Lab 03/25/2019 1433 03/29/19 0448 03/30/19 0431 03/31/19 0528 04/01/19 0614  WBC 8.4 6.8 7.3 8.0 6.2  NEUTROABS 6.8  --  6.4 6.8  --   HGB 9.4* 8.8* 8.9* 9.0* 8.7*  HCT 30.1* 27.2* 26.5* 27.2* 26.7*  MCV 92.3 90.1 88.0 89.2 89.9  PLT 186 174 165 204 172   Medications:    . calcium acetate  1,334 mg Oral TID  . Chlorhexidine Gluconate Cloth  6 each Topical Q0600  . cholecalciferol  2,000 Units Oral Daily  . feeding supplement (ENSURE ENLIVE)  237 mL Oral BID BM  . feeding supplement (PRO-STAT SUGAR FREE 64)  30 mL Oral BID  . heparin  5,000 Units Subcutaneous Q8H  . insulin aspart  0-5 Units Subcutaneous QHS  . insulin aspart  0-9 Units Subcutaneous TID WC  . mirtazapine  7.5 mg Oral QHS  . pantoprazole (PROTONIX) IV  40 mg Intravenous Q24H  . sevelamer carbonate  2,400 mg Oral TID   Elmarie Shiley, MD 04/02/2019, 9:51 AM

## 2019-04-02 NOTE — Consult Note (Signed)
Consultation Note Date: 04/02/2019   Patient Name: Lucas Calderon  DOB: 04/16/1941  MRN: [id number]  Age / Sex: 78 y.o., male  PCP: Maryella Shivers, MD Referring Physician: Tawni Millers  Reason for Consultation: Establishing goals of care and Psychosocial/spiritual support  HPI/Patient Profile: 78 y.o. male  with past medical history of dementia, ESRD on HD, hypertension, type 2 diabetes, CAD, d CHF, resident of [facility] admitted on 04/06/2019 with acute hypoxic respiratory failure bacterial pneumonia, COVID-19.   Clinical Assessment and Goals of Care: Conference with bedside nursing staff and attending relating to patient's condition, needs, disposition.  Call to sister Aileen Fass at [phone].  [name] is able to clearly describe Mr. Karman's current health conditions.  She shares that she understands that he is not eating, not communicating with staff at all, not tolerating HD.  Mrs. [name] shares that she has been aware that Abhiraj has memory loss since before he moved into residential SNF.  She tells me that he was living with her until his health worsened, he then moved into SNF [facility] in [place].  He has been a resident there for approximately 4 years.  She tells me that he moved to [facility] about 2 weeks ago.  We talked about his acute health problems in detail.  [name] shares that she spoke with providers yesterday about comfort measures.  I share that I think time is short, and she agrees.   I ask if Mr. Paris "has done the work he came here to do?".  [name] quickly tells me that she believes he has.  She tells me that Mr. 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  We did talk about how faith can bring [place] peace.   We talked about comfort measures.  Ms. [name] quickly states, "make him comfortable, use morphine if needed".  I share that since he has end-stage  renal disease, we would use Dilaudid or fentanyl, do nothing to bring death sooner, but keep him comfortable.  We talked about no CPR, no intubation, and [name] agrees to DNR status. NO further Hemodialysis.  We talked about residential hospice, and [name] is open to transition to residential hospice.  Her request is for hospice in [place].  Bedside nurse, attending, nephrology and social work updated.  Order for social work for hospice placement.   HCPOA    NEXT OF KIN -sister, Aileen Fass. Lived with [name] until he got really sick.  Not married,  no children.  Total of 6 living siblings, including them.  [name] shares that she and her daughter Raquel Sarna have discussed Mr. Lambrecht's condition, they feel like he has "given up hope".      SUMMARY OF RECOMMENDATIONS   Comfort and dignity at end-of-life Stop hemodialysis, let nature take its course Requesting residential hospice in [place]:  DNR  Symptom Management:   Per hospitalist, no additional needs at this time.  Palliative Prophylaxis:   Frequent Pain Assessment, Palliative  Wound Care and Turn Reposition  Additional Recommendations (Limitations, Scope, Preferences):  Full Comfort Care  Psycho-social/Spiritual:   Desire for further Chaplaincy support:no  Additional Recommendations: Caregiving  Support/Resources and Education on Hospice  Prognosis:   < 2 weeks, anticipated without further dialysis.  Discharge Planning: To be determined, based on outcomes and family choice.      Primary Diagnoses: Present on Admission: . HCAP (healthcare-associated pneumonia) . HTN (hypertension) . Anemia in CKD (chronic kidney disease) . End stage renal disease ([place]) . COVID-19   I have reviewed the medical record, interviewed the patient and family, and examined the patient. The following aspects are pertinent.  Past Medical History:  Diagnosis Date  . Anemia   . Anorexia   . Arthritis   .  Cancer ([place])    skin cancer - face  . CHF (congestive heart failure) ([place])   . Chronic kidney disease    dialysis T-TH-Sa  . Coronary artery disease   . Dementia ([place])   . Diabetes mellitus without complication ([place])    not taking any meds  . Dysphagia   . GERD (gastroesophageal reflux disease)   . Hypertension   . Pneumonia   . Shortness of breath    Social History   Socioeconomic History  . Marital status: Single    Spouse name: Not on file  . Number of children: Not on file  . Years of education: Not on file  . Highest education level: Not on file  Occupational History  . Not on file  Social Needs  . Financial resource strain: Not on file  . Food insecurity    Worry: Not on file    Inability: Not on file  . Transportation needs    Medical: Not on file    Non-medical: Not on file  Tobacco Use  . Smoking status: Former Smoker    Quit date: 01/30/1994    Years since quitting: 25.1  . Smokeless tobacco: Never Used  Substance and Sexual Activity  . Alcohol use: No  . Drug use: No  . Sexual activity: Not on file  Lifestyle  . Physical activity    Days per week: Not on file    Minutes per session: Not on file  . Stress: Not on file  Relationships  . Social Herbalist on phone: Not on file    Gets together: Not on file    Attends religious service: Not on file    Active member of club or organization: Not on file    Attends meetings of clubs or organizations: Not on file    Relationship status: Not on file  Other Topics Concern  . Not on file  Social History Narrative  . Not on file   Family History  Problem Relation Age of Onset  . Diabetes Mother   . Heart disease Mother   . Hypertension Mother   . Diabetes Father   . Hypertension Father   . Peripheral vascular disease Father   . Hypertension Sister    Scheduled Meds: . calcium acetate  1,334 mg Oral TID  . Chlorhexidine Gluconate Cloth  6 each Topical Q0600  . cholecalciferol  2,000  Units Oral Daily  . feeding supplement (ENSURE ENLIVE)  237 mL Oral BID BM  . feeding supplement (PRO-STAT SUGAR FREE 64)  30 mL Oral BID  . heparin  5,000 Units Subcutaneous Q8H  . mirtazapine  7.5 mg Oral QHS  . pantoprazole  40 mg Oral Daily  .  sevelamer carbonate  2,400 mg Oral TID   Continuous Infusions: . sodium chloride    . sodium chloride    . cefTRIAXone (ROCEPHIN)  IV 1 g (04/02/19 0922)   PRN Meds:.sodium chloride, sodium chloride, acetaminophen **OR** acetaminophen, alteplase, heparin, heparin, heparin, heparin, hydrocortisone, lidocaine (PF), lidocaine-prilocaine, pentafluoroprop-tetrafluoroeth Medications Prior to Admission:  Prior to Admission medications   Medication Sig Start Date End Date Taking? Authorizing Provider  acetaminophen (TYLENOL) 325 MG tablet Take 650 mg by mouth every 6 (six) hours as needed for mild pain.    [provider]  B Complex-C-Folic Acid (VITA-BEE/C) TABS Take 1 tablet by mouth daily.    [provider]  calcium acetate (PHOSLO) 667 MG capsule Take 1,334 mg by mouth 3 (three) times daily.    [provider]  carvedilol (COREG) 6.25 MG tablet Take 6.25 mg by mouth See admin instructions. Take 6.25mg  in evening every day of the week. Take 6.25mg  in the morning only on non-dialysis days: Tuesday, Thursday, Saturday and Sunday.    [provider]  Cholecalciferol (VITAMIN D) 50 MCG (2000 UT) tablet Take 2,000 Units by mouth daily.    [provider]  famotidine (PEPCID) 20 MG tablet Take 20 mg by mouth 3 (three) times a week. Monday, Wed, Friday    [provider]  hydrocortisone (ANUSOL-HC) 25 MG suppository Place 1 suppository (25 mg total) rectally 2 (two) times daily as needed for hemorrhoids or anal itching. 03/24/19   Samuella Cota, MD  isosorbide mononitrate (IMDUR) 60 MG 24 hr tablet Take 60 mg by mouth See admin instructions. Take 60mg  on Tuesdays, Thursdays, Saturdays and Sundays.     [provider]  mirtazapine (REMERON) 7.5 MG tablet Take 7.5 mg by mouth at bedtime.    [provider]  multivitamin (RENA-VIT) TABS tablet Take 1 tablet by mouth at bedtime. 11/22/13   Hongalgi, Lenis Dickinson, MD  RENVELA 800 MG tablet Take 2,400 mg by mouth 3 (three) times daily. 07/10/16   [provider]  Zinc Sulfate 220 (50 Zn) MG TABS Take 1 tablet (220 mg total) by mouth daily. 03/24/19   Samuella Cota, MD   Allergies  Allergen Reactions  . Penicillins Other (See Comments)    "Allergic," per MAR    Review of Systems  Unable to perform ROS: Dementia    Physical Exam Vitals signs and nursing note reviewed.  Constitutional:      General: He is not in acute distress.    Appearance: He is ill-appearing.     Comments: Chronically ill and frail  HENT:     Head: Atraumatic.  Cardiovascular:     Rate and Rhythm: Normal rate.  Pulmonary:     Effort: Pulmonary effort is normal. No respiratory distress.  Abdominal:     General: Abdomen is flat. There is no distension.  Skin:    General: Skin is warm and dry.  Neurological:     Comments: Known dementia  Psychiatric:     Comments: Unable to make his basic needs known     Vital Signs: BP 135/60 (BP Location: Left Leg)   Pulse 93   Temp 98.7 F (37.1 C) (Axillary)   Resp 13   Ht 5\' 8"  (1.727 m)   Wt 57.4 kg   SpO2 98%   BMI 19.24 kg/m  Pain Scale: Faces POSS *See Group Information*: 1-Acceptable,Awake and alert Pain Score: 0-No pain   SpO2: SpO2: 98 % O2 Device:SpO2: 98 % O2 Flow  Rate: .O2 Flow Rate (L/min): 0 L/min  IO: Intake/output summary:   Intake/Output Summary (Last 24 hours) at 04/02/2019 1431 Last data filed at 04/02/2019 0800 Gross per 24 hour  Intake 50 ml  Output 500 ml  Net -450 ml    LBM: Last BM Date: 04/01/19 Baseline Weight: Weight: 63 kg Most recent weight: Weight: 57.4 kg     Palliative Assessment/Data:   Flowsheet Rows     Most Recent Value  Intake Tab   Referral Department  Hospitalist  Unit at Time of Referral  Med/Surg Unit  Palliative Care Primary Diagnosis  Pulmonary  Date Notified  04/01/19  Palliative Care Type  New Palliative care  Reason for referral  Clarify Goals of Care  Date of Admission  04/14/2019  Date first seen by Palliative Care  04/02/19  # of days Palliative referral response time  1 Day(s)  # of days IP prior to Palliative referral  4  Clinical Assessment  Palliative Performance Scale Score  30%  Psychosocial & Spiritual Assessment  Palliative Care Outcomes      Time In: 1130 Time Out: 1245 Time Total: 75 Greater than 50%  of this time was spent counseling and coordinating care related to the above assessment and plan.  Signed by: Drue Novel, NP   Please contact Palliative Medicine Team phone at [phone] for questions and concerns.  For individual provider: See Shea Evans

## 2019-04-03 LAB — SARS CORONAVIRUS 2 (TAT 6-24 HRS): SARS Coronavirus 2: POSITIVE — AB

## 2019-04-03 NOTE — Progress Notes (Signed)
Renal Navigator notes transition to [place] and notified OP HD clinic.  Alphonzo Cruise, [place] Renal Navigator [phone]

## 2019-04-03 NOTE — Progress Notes (Signed)
Palliative:   Lucas Calderon is ready for transfer to residential hospice for comfort and dignity at end of life.  He will transfer to [facility] when a bed becomes available.  Palliative teams is helping to coordinate.  There is no family at bedside at this time.   Per nursing staff Lucas Calderon continues to decline food and drink and resist care. Comfort orders in place.   Conference with attending and bedside nursing related to patient condition, needs, transfer.  [place] (DNR) form on chart.   Plan: Residential Hospice in [place] for comfort and dignity at end of life.     62 minutes Lucas Axe, NP Palliative Medicine Team Team Phone # [phone] Greater than 50% of this time was spent counseling and coordinating care related to the above assessment and plan.

## 2019-04-03 NOTE — Discharge Summary (Signed)
Physician Discharge Summary  Lucas Calderon WNU:[id number] DOB: 07-20-1941 DOA: 03/22/2019  PCP: Lucas Shivers, MD  Admit date: 04/17/2019 Discharge date: 04/03/2019  Admitted From: SNF Disposition:  Home with hospice  Recommendations for Outpatient Follow-up and new medication changes:  1. Follow up with Hospice team.  2. Hemodialysis has been suspended.   Home Health: no   Equipment/Devices: no    Discharge Condition: stable  CODE STATUS: dnr   Diet recommendation: renal prudent as tolerated.   Brief/Interim Summary: 78 year old male who presented with altered mental status,transfer fromnursing facility. 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 He had a recent hospitalization for COVID-19 infection (03/22/19 to 03/25/19). On his initial physical examination his temperature was 99.2, heart rate 96, respiratory rate 16, blood pressure 119/70, oxygenation 95% on room air. His lungs had rales at the left lung base, no wheezing, heart S1-S2 present and rhythmic, abdomen soft, no lower extremity edema. SARS COVID-19 was positive. Chest x-ray had a new right upper lobe infiltrate.  Patient was admitted to the hospital working diagnosis of acute hypoxic respiratory failure due to SARS COVID-19 pneumonia  No clinical signs of viral pneumonia, no respiratory distress or significant oxygen desaturation. Patient was treated for aspiration pneumonia with IV ceftriaxone.   Patient had progressive and worsening decline in cognitive function, his family was contacted and he was changed to comfort measures, to follow with hospice as outpatient.   1.  Acute hypoxic respiratory failure, right upper lobe, aspiration pneumonia, present on admission,  in the setting of SARS COVID-19 infection.  Patient was admitted to the medical ward, he was placed on a telemetry monitor, received supplemental oxygen and IV antibiotic with ceftriaxone.  He had no evidence of viral pneumonia.  1 out of 2 bottles of blood culture was positive for coagulase negative staph, likely contamination.  Patient's cognitive function continued to rapidly deteriorate while hospitalized.  His sister was contacted and decision was made to change care to comfort measures and patient is being discharged to hospice.   2.  Acute metabolic encephalopathy in the setting of dementia.  Patient had worsening encephalopathy, declining any p.o. intake, no interaction, only grimacing to touch, nonverbal and nonambulatory.  Despite medical therapy for his current infection patient continued to rapidly deteriorate.  His prognosis is very poor and he will be discharge with comfort measures to hospice.  3.  End-stage renal disease on hemodialysis.  Patient received renal replacement therapy while hospitalized, on occasions he required physical restraints to allow dialysis.  He does have a very poor prognosis due to his cognitive decline, decision was made to discontinue renal replacement therapy.  4.  Anemia chronic renal disease.  Remained stable.  5.  Hypertension.  Antihypertensive agents were held during his hospitalization, will continue to hold carvedilol and isosorbide to prevent hypotension.   Discharge Diagnoses:  Principal Problem:   COVID-19 Active Problems:   HTN (hypertension)   Anemia in CKD (chronic kidney disease)   Diabetes mellitus, type 2 (HCC)   End stage renal disease (HCC)   HCAP (healthcare-associated pneumonia)   Pressure injury of skin   Goals of care, counseling/discussion   Palliative care by specialist   DNR (do not resuscitate) discussion   Encounter for hospice care discussion    Discharge Instructions   Allergies as of 04/03/2019  Reactions    Penicillins Other (See Comments)   "Allergic," per [facility]       Medication List    STOP taking these medications   carvedilol 6.25 MG tablet Commonly known as: COREG   isosorbide mononitrate 60 MG 24 hr tablet Commonly known as: IMDUR     TAKE these medications   acetaminophen 325 MG tablet Commonly known as: TYLENOL Take 650 mg by mouth every 6 (six) hours as needed for mild pain.   calcium acetate 667 MG capsule Commonly known as: PHOSLO Take 1,334 mg by mouth 3 (three) times daily.   famotidine 20 MG tablet Commonly known as: PEPCID Take 20 mg by mouth 3 (three) times a week. Monday, Wed, Friday   hydrocortisone 25 MG suppository Commonly known as: ANUSOL-HC Place 1 suppository (25 mg total) rectally 2 (two) times daily as needed for hemorrhoids or anal itching.   mirtazapine 7.5 MG tablet Commonly known as: REMERON Take 7.5 mg by mouth at bedtime.   Renvela 800 MG tablet Generic drug: sevelamer carbonate Take 2,400 mg by mouth 3 (three) times daily.   Vita-Bee/C Tabs Take 1 tablet by mouth daily.   multivitamin Tabs tablet Take 1 tablet by mouth at bedtime.   Vitamin D 50 MCG (2000 UT) tablet Take 2,000 Units by mouth daily.   Zinc Sulfate 220 (50 Zn) MG Tabs Take 1 tablet (220 mg total) by mouth daily.       Allergies  Allergen Reactions  . Penicillins Other (See Comments)    "Allergic," per [facility]     Consultations:  Nephrology    Procedures/Studies: Ct Head Wo Contrast  Result Date: 04/15/2019 CLINICAL DATA:  78 year old who is COVID-19 positive, presenting with acute mental status changes. Current history of end-stage renal disease on hemodialysis. EXAM: CT HEAD WITHOUT CONTRAST TECHNIQUE: Contiguous axial images were obtained from the base of the skull through the vertex without intravenous contrast. COMPARISON:  08/07/2016 and earlier. FINDINGS: Patient motion blurred many of the images. Brain: Moderate cortical and deep atrophy, unchanged. Mild  changes of small vessel disease of the white matter diffusely, unchanged. No mass lesion. No midline shift. No acute hemorrhage or hematoma. No extra-axial fluid collections. No evidence of acute infarction. No interval change. Vascular: Severe BILATERAL carotid siphon atherosclerosis. No hyperdense vessel. Skull: Skull base suboptimally imaged due to patient motion. Allowing for this, no focal osseous abnormalities involving the skull. Sinuses/Orbits: Visualized paranasal sinuses well aerated. BILATERAL mastoid air cells and BILATERAL middle ear cavities also appear well aerated, though are blurred by patient motion. Orbits and globes not optimally imaged due to patient motion. For Other: None. IMPRESSION: 1. Motion degraded examination demonstrates no acute intracranial abnormality. 2. Stable moderate generalized atrophy and mild chronic microvascular ischemic changes of the white matter. Electronically Signed   By: Evangeline Dakin M.D.   On: 04/11/2019 17:48   Dg Chest Portable 1 View  Result Date: 04/18/2019 CLINICAL DATA:  Acute onset of chest pain and shortness of breath. Patient is COVID-19 positive. EXAM: PORTABLE CHEST 1 VIEW COMPARISON:  03/22/2019 and earlier. FINDINGS: Cardiac silhouette normal in size, unchanged. Thoracic aorta markedly atherosclerotic, unchanged. Interval development of ground-glass airspace consolidation in the RIGHT UPPER LOBE since the examination 6 days ago. No new lung parenchymal findings elsewhere in either lung. Normal pulmonary vascularity. No visible pleural effusions. IMPRESSION: Acute RIGHT upper lobe pneumonia, likely viral. Electronically Signed   By: Evangeline Dakin M.D.   On: 03/29/2019 14:37   Dg Chest Portable  1 View  Result Date: 03/22/2019 CLINICAL DATA:  COVID-19, altered level consciousness, shortness of breath EXAM: PORTABLE CHEST 1 VIEW COMPARISON:  03/13/2019 FINDINGS: The heart size and mediastinal contours are within normal limits. Aortic  atherosclerosis. Both lungs are clear. The visualized skeletal structures are unremarkable. IMPRESSION: No acute abnormality of the lungs in AP portable projection. Electronically Signed   By: Eddie Candle M.D.   On: 03/22/2019 16:02      Procedures:   Subjective: Patient is not interactive only grimacing to pain, not verbal and not ambulatory, no po intake.   Discharge Exam: Vitals:   04/02/19 2001 04/03/19 0746  BP: 121/87 129/69  Pulse: 98 95  Resp:  16  Temp: 98 F (36.7 C) 99 F (37.2 C)  SpO2: 94% 95%   Vitals:   04/02/19 0800 04/02/19 1546 04/02/19 2001 04/03/19 0746  BP:  122/66 121/87 129/69  Pulse:  97 98 95  Resp:    16  Temp:  99.4 F (37.4 C) 98 F (36.7 C) 99 F (37.2 C)  TempSrc:  Axillary Oral Axillary  SpO2: 98% 98% 94% 95%  Weight:      Height:        General: deconditioned and ill looking appearing.  Neurology: patient not interactive, only grimacing to pain. Not verbal.  E ENT: positive pallor, no icterus, oral mucosa dry.  Cardiovascular: No JVD. S1-S2 present, rhythmic, no gallops, rubs, or murmurs. No lower extremity edema. Pulmonary:  Positive  breath sounds bilaterally. Gastrointestinal. Abdomen with no organomegaly, non tender, no rebound or guarding Skin. No rashes Musculoskeletal: no joint deformities   The results of significant diagnostics from this hospitalization (including imaging, microbiology, ancillary and laboratory) are listed below for reference.     Microbiology: Recent Results (from the past 240 hour(s))  Blood culture (routine x 2)     Status: Abnormal   Collection Time: 04/03/2019  2:33 PM   Specimen: BLOOD  Result Value Ref Range Status   Specimen Description BLOOD RIGHT ANTECUBITAL  Final   Special Requests   Final    BOTTLES DRAWN AEROBIC AND ANAEROBIC Blood Culture adequate volume   Culture  Setup Time   Final    GRAM POSITIVE COCCI AEROBIC BOTTLE ONLY CRITICAL RESULT CALLED TO, READ BACK BY AND VERIFIED WITH: J  LEDFORD PHARMD 2325 03/29/19 A BROWNING    Culture (A)  Final    STAPHYLOCOCCUS SPECIES (COAGULASE NEGATIVE) THE SIGNIFICANCE OF ISOLATING THIS ORGANISM FROM A SINGLE SET OF BLOOD CULTURES WHEN MULTIPLE SETS ARE DRAWN IS UNCERTAIN. PLEASE NOTIFY THE MICROBIOLOGY DEPARTMENT WITHIN ONE WEEK IF SPECIATION AND SENSITIVITIES ARE REQUIRED. Performed at [facility], [place] [address], [place], [place] [address]    Report Status 03/30/2019 FINAL  Final  Blood Culture ID Panel (Reflexed)     Status: Abnormal   Collection Time: 03/30/2019  2:33 PM  Result Value Ref Range Status   Enterococcus species NOT DETECTED NOT DETECTED Final   Listeria monocytogenes NOT DETECTED NOT DETECTED Final   Staphylococcus species DETECTED (A) NOT DETECTED Final    Comment: Methicillin (oxacillin) resistant coagulase negative staphylococcus. Possible blood culture contaminant (unless isolated from more than one blood culture draw or clinical case suggests pathogenicity). No antibiotic treatment is indicated for blood  culture contaminants. CRITICAL RESULT CALLED TO, READ BACK BY AND VERIFIED WITH: J LEDFORD PHARMD 2325 03/29/19 A BROWNING    Staphylococcus aureus (BCID) NOT DETECTED NOT DETECTED Final   Methicillin resistance DETECTED (A) NOT DETECTED  Final    Comment: CRITICAL RESULT CALLED TO, READ BACK BY AND VERIFIED WITH: J LEDFORD PHARMD 2325 03/29/19 A BROWNING    Streptococcus species NOT DETECTED NOT DETECTED Final   Streptococcus agalactiae NOT DETECTED NOT DETECTED Final   Streptococcus pneumoniae NOT DETECTED NOT DETECTED Final   Streptococcus pyogenes NOT DETECTED NOT DETECTED Final   Acinetobacter baumannii NOT DETECTED NOT DETECTED Final   Enterobacteriaceae species NOT DETECTED NOT DETECTED Final   Enterobacter cloacae complex NOT DETECTED NOT DETECTED Final   Escherichia coli NOT DETECTED NOT DETECTED Final   Klebsiella oxytoca NOT DETECTED NOT DETECTED Final   Klebsiella pneumoniae NOT DETECTED NOT  DETECTED Final   Proteus species NOT DETECTED NOT DETECTED Final   Serratia marcescens NOT DETECTED NOT DETECTED Final   Haemophilus influenzae NOT DETECTED NOT DETECTED Final   Neisseria meningitidis NOT DETECTED NOT DETECTED Final   Pseudomonas aeruginosa NOT DETECTED NOT DETECTED Final   Candida albicans NOT DETECTED NOT DETECTED Final   Candida glabrata NOT DETECTED NOT DETECTED Final   Candida krusei NOT DETECTED NOT DETECTED Final   Candida parapsilosis NOT DETECTED NOT DETECTED Final   Candida tropicalis NOT DETECTED NOT DETECTED Final    Comment: Performed at [facility], [place]. [address], [place], [place] [address]  Blood culture (routine x 2)     Status: None   Collection Time: 03/31/2019  4:16 PM   Specimen: BLOOD RIGHT ARM  Result Value Ref Range Status   Specimen Description BLOOD RIGHT ARM  Final   Special Requests   Final    BOTTLES DRAWN AEROBIC AND ANAEROBIC Blood Culture adequate volume   Culture   Final    NO GROWTH 5 DAYS Performed at [facility], [place] [address], [place], [place] [address]    Report Status 04/02/2019 FINAL  Final  SARS Coronavirus 2 [facility] order, Performed in [facility] hospital lab) Nasopharyngeal Nasopharyngeal Swab     Status: Abnormal   Collection Time: 04/05/2019  6:55 PM   Specimen: Nasopharyngeal Swab  Result Value Ref Range Status   SARS Coronavirus 2 POSITIVE (A) NEGATIVE Final    Comment: RESULT CALLED TO, READ BACK BY AND VERIFIED WITH: Ivin Poot RN [id number] 03/27/2019 A BROWNING (NOTE) If result is NEGATIVE SARS-CoV-2 target nucleic acids are NOT DETECTED. The SARS-CoV-2 RNA is generally detectable in upper and lower  respiratory specimens during the acute phase of infection. The lowest  concentration of SARS-CoV-2 viral copies this assay can detect is 250  copies / mL. A negative result does not preclude SARS-CoV-2 infection  and should not be used as the sole basis for treatment or other  patient management decisions.  A  negative result may occur with  improper specimen collection / handling, submission of specimen other  than nasopharyngeal swab, presence of viral mutation(s) within the  areas targeted by this assay, and inadequate number of viral copies  (<250 copies / mL). A negative result must be combined with clinical  observations, patient history, and epidemiological information. If result is POSITIVE SARS-CoV-2 target nucleic acids are DETECTED. Th e SARS-CoV-2 RNA is generally detectable in upper and lower  respiratory specimens during the acute phase of infection.  Positive  results are indicative of active infection with SARS-CoV-2.  Clinical  correlation with patient history and other diagnostic information is  necessary to determine patient infection status.  Positive results do  not rule out bacterial infection or co-infection with other viruses. If result is PRESUMPTIVE  POSTIVE SARS-CoV-2 nucleic acids MAY BE PRESENT.   A presumptive positive result was obtained on the submitted specimen  and confirmed on repeat testing.  While 2019 novel coronavirus  (SARS-CoV-2) nucleic acids may be present in the submitted sample  additional confirmatory testing may be necessary for epidemiological  and / or clinical management purposes  to differentiate between  SARS-CoV-2 and other Sarbecovirus currently known to infect humans.  If clinically indicated additional testing with an alternate test  methodology [phone]) is  advised. The SARS-CoV-2 RNA is generally  detectable in upper and lower respiratory specimens during the acute  phase of infection. The expected result is Negative. Fact Sheet for Patients:  StrictlyIdeas.no Fact Sheet for Healthcare Providers: BankingDealers.co.za This test is not yet approved or cleared by the [place] FDA and has been authorized for detection and/or diagnosis of SARS-CoV-2 by FDA under an Emergency Use  Authorization (EUA).  This EUA will remain in effect (meaning this test can be used) for the duration of the COVID-19 declaration under Section 564(b)(1) of the Act, 21 U.S.C. section 360bbb-3(b)(1), unless the authorization is terminated or revoked sooner. Performed at [facility], [place] [address], [place], [place] [address]      Labs: BNP (last 3 results) No results for input(s): BNP in the last 8760 hours. Basic Metabolic Panel: Recent Labs  Lab 04/04/2019 1433 03/29/19 0448 03/30/19 0431 03/31/19 0528 04/01/19 0614 04/02/19 0756  NA 144 143 138 139 141 142  K 5.0 4.8 4.0 4.3 3.9 4.2  CL 101 103 96* 98 100 101  CO2 21* 17* 21* 23 22 20*  GLUCOSE 211* 168* 160* 138* 111* 96  BUN 89* 98* 40* 48* 67* 65*  CREATININE 16.72* 17.10* 8.96* 7.95* 10.96* 11.08*  CALCIUM 8.2* 7.7* 7.2* 7.4* 7.2* 7.4*  MG 2.7*  --   --   --   --   --   PHOS  --   --   --   --  6.4*  --    Liver Function Tests: Recent Labs  Lab 04/14/2019 1433 03/29/19 0448 04/01/19 0614  AST 48* 42*  --   ALT 28 25  --   ALKPHOS 45 46  --   BILITOT 0.9 0.9  --   PROT 7.4 7.1  --   ALBUMIN 2.6* 2.3* 2.2*   Recent Labs  Lab 04/18/2019 1433  LIPASE 81*   Recent Labs  Lab 04/17/2019 1433  AMMONIA 24   CBC: Recent Labs  Lab 04/04/2019 1433 03/29/19 0448 03/30/19 0431 03/31/19 0528 04/01/19 0614  WBC 8.4 6.8 7.3 8.0 6.2  NEUTROABS 6.8  --  6.4 6.8  --   HGB 9.4* 8.8* 8.9* 9.0* 8.7*  HCT 30.1* 27.2* 26.5* 27.2* 26.7*  MCV 92.3 90.1 88.0 89.2 89.9  PLT 186 174 165 204 172   Cardiac Enzymes: No results for input(s): CKTOTAL, CKMB, CKMBINDEX, TROPONINI in the last 168 hours. BNP: Invalid input(s): POCBNP CBG: Recent Labs  Lab 04/01/19 1227 04/01/19 1523 04/01/19 2043 04/02/19 0457 04/02/19 0745  GLUCAP 120* 106* 94 89 89   D-Dimer No results for input(s): DDIMER in the last 72 hours. Hgb A1c No results for input(s): HGBA1C in the last 72 hours. Lipid Profile No results for input(s):  CHOL, HDL, LDLCALC, TRIG, CHOLHDL, LDLDIRECT in the last 72 hours. Thyroid function studies No results for input(s): TSH, T4TOTAL, T3FREE, THYROIDAB in the last 72 hours.  Invalid input(s): FREET3 Anemia work up No results for input(s): VITAMINB12,  FOLATE, FERRITIN, TIBC, IRON, RETICCTPCT in the last 72 hours. Urinalysis    Component Value Date/Time   COLORURINE YELLOW 11/17/2013 1642   APPEARANCEUR CLEAR 11/17/2013 1642   LABSPEC 1.014 11/17/2013 1642   PHURINE 5.5 11/17/2013 1642   GLUCOSEU NEGATIVE 11/17/2013 1642   HGBUR TRACE (A) 11/17/2013 1642   BILIRUBINUR NEGATIVE 11/17/2013 1642   KETONESUR NEGATIVE 11/17/2013 1642   PROTEINUR 100 (A) 11/17/2013 1642   UROBILINOGEN 0.2 11/17/2013 1642   NITRITE NEGATIVE 11/17/2013 1642   LEUKOCYTESUR NEGATIVE 11/17/2013 1642   Sepsis Labs Invalid input(s): PROCALCITONIN,  WBC,  LACTICIDVEN Microbiology Recent Results (from the past 240 hour(s))  Blood culture (routine x 2)     Status: Abnormal   Collection Time: 04/11/2019  2:33 PM   Specimen: BLOOD  Result Value Ref Range Status   Specimen Description BLOOD RIGHT ANTECUBITAL  Final   Special Requests   Final    BOTTLES DRAWN AEROBIC AND ANAEROBIC Blood Culture adequate volume   Culture  Setup Time   Final    GRAM POSITIVE COCCI AEROBIC BOTTLE ONLY CRITICAL RESULT CALLED TO, READ BACK BY AND VERIFIED WITH: J LEDFORD PHARMD 2325 03/29/19 A BROWNING    Culture (A)  Final    STAPHYLOCOCCUS SPECIES (COAGULASE NEGATIVE) THE SIGNIFICANCE OF ISOLATING THIS ORGANISM FROM A SINGLE SET OF BLOOD CULTURES WHEN MULTIPLE SETS ARE DRAWN IS UNCERTAIN. PLEASE NOTIFY THE MICROBIOLOGY DEPARTMENT WITHIN ONE WEEK IF SPECIATION AND SENSITIVITIES ARE REQUIRED. Performed at [facility], [place] [address], [place], [place] [address]    Report Status 03/30/2019 FINAL  Final  Blood Culture ID Panel (Reflexed)     Status: Abnormal   Collection Time: 03/27/2019  2:33 PM  Result Value Ref Range Status    Enterococcus species NOT DETECTED NOT DETECTED Final   Listeria monocytogenes NOT DETECTED NOT DETECTED Final   Staphylococcus species DETECTED (A) NOT DETECTED Final    Comment: Methicillin (oxacillin) resistant coagulase negative staphylococcus. Possible blood culture contaminant (unless isolated from more than one blood culture draw or clinical case suggests pathogenicity). No antibiotic treatment is indicated for blood  culture contaminants. CRITICAL RESULT CALLED TO, READ BACK BY AND VERIFIED WITH: J LEDFORD PHARMD 2325 03/29/19 A BROWNING    Staphylococcus aureus (BCID) NOT DETECTED NOT DETECTED Final   Methicillin resistance DETECTED (A) NOT DETECTED Final    Comment: CRITICAL RESULT CALLED TO, READ BACK BY AND VERIFIED WITH: J LEDFORD PHARMD 2325 03/29/19 A BROWNING    Streptococcus species NOT DETECTED NOT DETECTED Final   Streptococcus agalactiae NOT DETECTED NOT DETECTED Final   Streptococcus pneumoniae NOT DETECTED NOT DETECTED Final   Streptococcus pyogenes NOT DETECTED NOT DETECTED Final   Acinetobacter baumannii NOT DETECTED NOT DETECTED Final   Enterobacteriaceae species NOT DETECTED NOT DETECTED Final   Enterobacter cloacae complex NOT DETECTED NOT DETECTED Final   Escherichia coli NOT DETECTED NOT DETECTED Final   Klebsiella oxytoca NOT DETECTED NOT DETECTED Final   Klebsiella pneumoniae NOT DETECTED NOT DETECTED Final   Proteus species NOT DETECTED NOT DETECTED Final   Serratia marcescens NOT DETECTED NOT DETECTED Final   Haemophilus influenzae NOT DETECTED NOT DETECTED Final   Neisseria meningitidis NOT DETECTED NOT DETECTED Final   Pseudomonas aeruginosa NOT DETECTED NOT DETECTED Final   Candida albicans NOT DETECTED NOT DETECTED Final   Candida glabrata NOT DETECTED NOT DETECTED Final   Candida krusei NOT DETECTED NOT DETECTED Final   Candida parapsilosis NOT DETECTED NOT DETECTED Final   Candida tropicalis  NOT DETECTED NOT DETECTED Final    Comment: Performed at  [facility], [place] [address], [place], [place] [address]  Blood culture (routine x 2)     Status: None   Collection Time: 04/12/2019  4:16 PM   Specimen: BLOOD RIGHT ARM  Result Value Ref Range Status   Specimen Description BLOOD RIGHT ARM  Final   Special Requests   Final    BOTTLES DRAWN AEROBIC AND ANAEROBIC Blood Culture adequate volume   Culture   Final    NO GROWTH 5 DAYS Performed at [facility], [place] [address], [place], [place] [address]    Report Status 04/02/2019 FINAL  Final  SARS Coronavirus 2 [facility] order, Performed in [facility] hospital lab) Nasopharyngeal Nasopharyngeal Swab     Status: Abnormal   Collection Time: 04/11/2019  6:55 PM   Specimen: Nasopharyngeal Swab  Result Value Ref Range Status   SARS Coronavirus 2 POSITIVE (A) NEGATIVE Final    Comment: RESULT CALLED TO, READ BACK BY AND VERIFIED WITH: Ivin Poot RN [id number] 04/01/2019 A BROWNING (NOTE) If result is NEGATIVE SARS-CoV-2 target nucleic acids are NOT DETECTED. The SARS-CoV-2 RNA is generally detectable in upper and lower  respiratory specimens during the acute phase of infection. The lowest  concentration of SARS-CoV-2 viral copies this assay can detect is 250  copies / mL. A negative result does not preclude SARS-CoV-2 infection  and should not be used as the sole basis for treatment or other  patient management decisions.  A negative result may occur with  improper specimen collection / handling, submission of specimen other  than nasopharyngeal swab, presence of viral mutation(s) within the  areas targeted by this assay, and inadequate number of viral copies  (<250 copies / mL). A negative result must be combined with clinical  observations, patient history, and epidemiological information. If result is POSITIVE SARS-CoV-2 target nucleic acids are DETECTED. Th e SARS-CoV-2 RNA is generally detectable in upper and lower  respiratory specimens during the acute phase of infection.  Positive   results are indicative of active infection with SARS-CoV-2.  Clinical  correlation with patient history and other diagnostic information is  necessary to determine patient infection status.  Positive results do  not rule out bacterial infection or co-infection with other viruses. If result is PRESUMPTIVE POSTIVE SARS-CoV-2 nucleic acids MAY BE PRESENT.   A presumptive positive result was obtained on the submitted specimen  and confirmed on repeat testing.  While 2019 novel coronavirus  (SARS-CoV-2) nucleic acids may be present in the submitted sample  additional confirmatory testing may be necessary for epidemiological  and / or clinical management purposes  to differentiate between  SARS-CoV-2 and other Sarbecovirus currently known to infect humans.  If clinically indicated additional testing with an alternate test  methodology [phone]) is  advised. The SARS-CoV-2 RNA is generally  detectable in upper and lower respiratory specimens during the acute  phase of infection. The expected result is Negative. Fact Sheet for Patients:  StrictlyIdeas.no Fact Sheet for Healthcare Providers: BankingDealers.co.za This test is not yet approved or cleared by the [place] FDA and has been authorized for detection and/or diagnosis of SARS-CoV-2 by FDA under an Emergency Use Authorization (EUA).  This EUA will remain in effect (meaning this test can be used) for the duration of the COVID-19 declaration under Section 564(b)(1) of the Act, 21 U.S.C. section 360bbb-3(b)(1), unless the authorization is terminated or revoked sooner. Performed at [facility],  [address], [place], [place] [address]      Time coordinating discharge: 45 minutes  SIGNED:   Tawni Millers, MD  [facility] 04/03/2019, 1:34 PM

## 2019-04-04 NOTE — Progress Notes (Signed)
Several attempts made offering food and beverage. Pt refused each time. VS stable, continuing to monitor.

## 2019-04-04 NOTE — Progress Notes (Signed)
Palliative:   Mr. Isadore is frail and weak.  Per nursing staff he continues to decline food and drink.  He remains stable for transfer to residential hospice for comfort and dignity at end of life.  He has been accepted with [facility].   Unfortunately, there is no bed available at this time.  The longer he remains here in the hospital the greater the likelihood that he will be too unstable, too near end of life, to transport.   There is no family at bedside at this time.   Per nursing staff Mr. Sally continues to decline food and drink. Comfort orders in place.   Conference with Education officer, museum and bedside nursing related to patient condition, needs, transfer.  [place] (DNR) form on chart.   Plan:Residential Hospice in [place] for comfort and dignity at end of life.    60minutes Quinn Axe, NP Palliative Medicine Team Team Phone # [phone] Greater than 50% of this time was spent counseling and coordinating care related to the above assessment and plan.

## 2019-04-04 NOTE — Progress Notes (Signed)
Patient continued to decline in his overall health, poor oral intake, not interactive.  His blood pressure is 129/55 heart rate 102, respiratory rate 16 oxygen saturation 98% on 2 L of supplemental oxygen.   Patient remains positive for COVID-19.  He is waiting to be transferred to hospice.

## 2019-04-04 NOTE — Progress Notes (Signed)
[facility] Pt continues to be appropriate for inpt facility at hospice facility. However we unfortunately do not have a bed to offer at this time on the COVID unit. Webb Silversmith RN [phone]

## 2019-04-04 NOTE — Consult Note (Signed)
   [facility] CM Inpatient Consult   04/04/2019  Lucas Calderon 10-May-1941 [id number]   Patient assessed for unplanned readmission in the Medicare ACO.  Patient is from a skilled facility and was long term care.  Briefly reviewed chart for admission and palliative care consult.  Patient is now under Hospice care and therefore will receive appropriate transition of care needs.  Will sign off.  For changes or questions, please contact:   Natividad Brood, RN BSN [facility] Liaison  [phone] business mobile phone Toll free office [phone]  Fax number: [phone] [email or web address]

## 2019-04-05 ENCOUNTER — Other Ambulatory Visit: Payer: Self-pay

## 2019-04-05 NOTE — Plan of Care (Signed)
  Problem: Education: Goal: Knowledge of risk factors and measures for prevention of condition will improve Outcome: Not Progressing   Problem: Coping: Goal: Psychosocial and spiritual needs will be supported Outcome: Not Progressing   Problem: Respiratory: Goal: Will maintain a patent airway Outcome: Not Progressing Goal: Complications related to the disease process, condition or treatment will be avoided or minimized Outcome: Not Progressing   Problem: Education: Goal: Knowledge of General Education information will improve Description: Including pain rating scale, medication(s)/side effects and non-pharmacologic comfort measures Outcome: Not Progressing   Problem: Health Behavior/Discharge Planning: Goal: Ability to manage health-related needs will improve Outcome: Not Progressing   Problem: Clinical Measurements: Goal: Ability to maintain clinical measurements within normal limits will improve Outcome: Not Progressing Goal: Will remain free from infection Outcome: Not Progressing Goal: Diagnostic test results will improve Outcome: Not Progressing Goal: Respiratory complications will improve Outcome: Not Progressing Goal: Cardiovascular complication will be avoided Outcome: Not Progressing   Problem: Activity: Goal: Risk for activity intolerance will decrease Outcome: Not Progressing   Problem: Nutrition: Goal: Adequate nutrition will be maintained Outcome: Not Progressing   Problem: Coping: Goal: Level of anxiety will decrease Outcome: Not Progressing   Problem: Elimination: Goal: Will not experience complications related to bowel motility Outcome: Not Progressing Goal: Will not experience complications related to urinary retention Outcome: Not Progressing   Problem: Pain Managment: Goal: General experience of comfort will improve Outcome: Not Progressing   Problem: Safety: Goal: Ability to remain free from injury will improve Outcome: Not Progressing    Problem: Skin Integrity: Goal: Risk for impaired skin integrity will decrease Outcome: Not Progressing   

## 2019-04-05 NOTE — Progress Notes (Signed)
PROGRESS NOTE    Lucas Calderon  ZWC:[id number] DOB: 01-20-1941 DOA: 04/18/2019 PCP: Maryella Shivers, MD    Brief Narrative:  78 year old male who presented with altered mental status,transfer fromnursing facility. 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 He had a recent hospitalization for COVID-19 infection (03/22/19 to 03/25/19). On his initial physical examination his temperature was 99.2, heart rate 96, respiratory rate 16, blood pressure 119/70, oxygenation 95% on room air. His lungs had rales at the left lung base, no wheezing, heart S1-S2 present and rhythmic, abdomen soft, no lower extremity edema. SARS COVID-19 was positive. Chest x-ray had a new right upper lobe infiltrate.  Patient was admitted to the hospital working diagnosis of acute hypoxic respiratory failure due to SARS COVID-19 pneumonia  No clinical signs of viral pneumonia, no respiratory distress or significant oxygen desaturation. Patient was treated for aspiration pneumonia with IV ceftriaxone.   Patient had progressive and worsening decline in cognitive function, his family was contacted and he was changed to comfort measures, to follow with hospice as outpatient.    Assessment & Plan:   Principal Problem:   COVID-19 Active Problems:   HTN (hypertension)   Anemia in CKD (chronic kidney disease)   Diabetes mellitus, type 2 (HCC)   End stage renal disease ([place])   HCAP (healthcare-associated pneumonia)   Pressure injury of skin   Goals of care, counseling/discussion   Palliative care by specialist   DNR (do not resuscitate) discussion   Encounter for hospice care discussion   1.  Acute hypoxic respiratory failure, right upper lobe, aspiration pneumonia,  present on admission, in the setting of SARS COVID-19 infection. Patient with no dyspnea or respiratory distress. Continue to be not interactive. Oxymetry is 96% on room air. Pending placement in Hospice. Continue as needed fentanyl and lorazepam.   2.  Acute metabolic encephalopathy in the setting of dementia. Severe cognitive decline, very poor oral intake, not verbal and not interactive. Very poor prognosis.   3.  End-stage renal disease on hemodialysis.  renal replacement therapy has been discontinued.   4.  Anemia chronic renal disease. no further follow up due to poor prognosis.   5.  Hypertension.  Blood pressure continue to be stable at 129/68.     DVT prophylaxis: scd  Code Status: DNR Family Communication:  No family at the bedside  Disposition Plan/ discharge barriers: pending placement hospice.   Body mass index is 19.24 kg/m. Malnutrition Type:      Malnutrition Characteristics:      Nutrition Interventions:     RN Pressure Injury Documentation: Pressure Injury 03/31/2019 Perineum Bilateral Stage II -  Partial thickness loss of dermis presenting as a shallow open ulcer with a red, pink wound bed without slough. Pink wound bed (Active)  04/19/2019 2230  Location: Perineum  Location Orientation: Bilateral  Staging: Stage II -  Partial thickness loss of dermis presenting as a shallow open ulcer with a red, pink wound bed without slough.  Wound Description (Comments): Pink wound bed  Present on Admission: Yes     Consultants:   Palliative Care  Nephrology   Procedures:     Antimicrobials:       Subjective: Patient is not interactive, he is not verbal and non ambulatory.   Objective: Vitals:   04/04/19 0755 04/04/19 1600 04/04/19 2344 04/05/19 0800  BP: (!) 129/55 133/65 124/61 129/68  Pulse: (!) 102 93 (!) 107 97  Resp: 15 16  15   Temp: 98.2 F (36.8 C) 98 F (36.7 C)  97.7 F (36.5 C)  TempSrc: Oral Axillary  Axillary  SpO2: 90% 96%  97% 96%  Weight:      Height:        Intake/Output Summary (Last 24 hours) at 04/05/2019 0838 Last data filed at 04/04/2019 0900 Gross per 24 hour  Intake 0 ml  Output -  Net 0 ml   Filed Weights   03/31/19 0045 04/01/19 1505 04/01/19 1815  Weight: 58 kg 57.9 kg 57.4 kg    Examination:   General: deconditioned and ill looking appearing.  Neurology: somnolent, grimaces to touch.  E ENT: no pallor, no icterus, oral mucosa moist Cardiovascular: No JVD. S1-S2 present, rhythmic, no gallops, rubs, or murmurs. No lower extremity edema. Pulmonary: positive breath sounds bilaterally. Gastrointestinal. Abdomen flat, no organomegaly, non tender, no rebound or guarding Skin. No rashes Musculoskeletal: no joint deformities     Data Reviewed: I have personally reviewed following labs and imaging studies  CBC: Recent Labs  Lab 03/30/19 0431 03/31/19 0528 04/01/19 0614  WBC 7.3 8.0 6.2  NEUTROABS 6.4 6.8  --   HGB 8.9* 9.0* 8.7*  HCT 26.5* 27.2* 26.7*  MCV 88.0 89.2 89.9  PLT 165 204 101   Basic Metabolic Panel: Recent Labs  Lab 03/30/19 0431 03/31/19 0528 04/01/19 0614 04/02/19 0756  NA 138 139 141 142  K 4.0 4.3 3.9 4.2  CL 96* 98 100 101  CO2 21* 23 22 20*  GLUCOSE 160* 138* 111* 96  BUN 40* 48* 67* 65*  CREATININE 8.96* 7.95* 10.96* 11.08*  CALCIUM 7.2* 7.4* 7.2* 7.4*  PHOS  --   --  6.4*  --    GFR: Estimated Creatinine Clearance: 4.7 mL/min (A) (by C-G formula based on SCr of 11.08 mg/dL (H)). Liver Function Tests: Recent Labs  Lab 04/01/19 0614  ALBUMIN 2.2*   No results for input(s): LIPASE, AMYLASE in the last 168 hours. No results for input(s): AMMONIA in the last 168 hours. Coagulation Profile: No results for input(s): INR, PROTIME in the last 168 hours. Cardiac Enzymes: No results for input(s): CKTOTAL, CKMB, CKMBINDEX, TROPONINI in the last 168 hours. BNP (last 3 results) No results for input(s): PROBNP in the last 8760 hours. HbA1C: No  results for input(s): HGBA1C in the last 72 hours. CBG: Recent Labs  Lab 04/01/19 1227 04/01/19 1523 04/01/19 2043 04/02/19 0457 04/02/19 0745  GLUCAP 120* 106* 94 89 89   Lipid Profile: No results for input(s): CHOL, HDL, LDLCALC, TRIG, CHOLHDL, LDLDIRECT in the last 72 hours. Thyroid Function Tests: No results for input(s): TSH, T4TOTAL, FREET4, T3FREE, THYROIDAB in the last 72 hours. Anemia Panel: No results for input(s): VITAMINB12, FOLATE, FERRITIN, TIBC, IRON, RETICCTPCT in the last 72 hours.    Radiology Studies: I have reviewed all of the imaging during this hospital visit personally     Scheduled Meds: Continuous Infusions:   LOS: 8 days        Varie Machamer Gerome Apley, MD

## 2019-04-06 ENCOUNTER — Other Ambulatory Visit: Payer: Self-pay

## 2019-04-06 MED ORDER — ACETAMINOPHEN 325 MG PO TABS: 650.0000 mg | ORAL_TABLET | Freq: Four times a day (QID) | ORAL | Status: DC | PRN

## 2019-04-06 MED ORDER — ONDANSETRON 4 MG PO TBDP: 4.0000 mg | ORAL_TABLET | Freq: Four times a day (QID) | ORAL | Status: DC | PRN

## 2019-04-06 MED ORDER — POLYVINYL ALCOHOL 1.4 % OP SOLN: 1.0000 [drp] | Freq: Four times a day (QID) | OPHTHALMIC | Status: DC | PRN

## 2019-04-06 MED ORDER — BIOTENE DRY MOUTH MT LIQD: 15.0000 mL | OROMUCOSAL | Status: DC | PRN

## 2019-04-06 MED ORDER — GLYCOPYRROLATE 1 MG PO TABS: 1.0000 mg | ORAL_TABLET | ORAL | Status: DC | PRN

## 2019-04-06 MED ORDER — HALOPERIDOL LACTATE 2 MG/ML PO CONC
0.5000 mg | ORAL | Status: DC | PRN
  Filled 2019-04-06: qty 0.3

## 2019-04-06 MED ORDER — GLYCOPYRROLATE 0.2 MG/ML IJ SOLN: 0.2000 mg | INTRAMUSCULAR | Status: DC | PRN

## 2019-04-06 MED ORDER — HALOPERIDOL LACTATE 5 MG/ML IJ SOLN: 0.5000 mg | INTRAMUSCULAR | Status: DC | PRN

## 2019-04-06 MED ORDER — ONDANSETRON HCL 4 MG/2ML IJ SOLN: 4.0000 mg | Freq: Four times a day (QID) | INTRAMUSCULAR | Status: DC | PRN

## 2019-04-06 MED ORDER — ACETAMINOPHEN 650 MG RE SUPP: 650.0000 mg | Freq: Four times a day (QID) | RECTAL | Status: DC | PRN

## 2019-04-06 MED ORDER — SODIUM CHLORIDE 0.9 % IV SOLN
0.5000 mg/h | INTRAVENOUS | Status: DC
  Administered 2019-04-06: 0.5 mg/h via INTRAVENOUS
  Filled 2019-04-06 (×2): qty 5

## 2019-04-06 MED ORDER — HALOPERIDOL 0.5 MG PO TABS: 0.5000 mg | ORAL_TABLET | ORAL | Status: DC | PRN

## 2019-04-06 NOTE — Discharge Summary (Addendum)
PROGRESS NOTE    Lucas Calderon  HGD:[id number] DOB: 1941/03/18 DOA: 04/04/2019 PCP: Maryella Shivers, MD    Brief Narrative:  78 year old male who presented with altered mental status,transfer fromnursing facility. 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 He had a recent hospitalization for COVID-19 infection (03/22/19 to 03/25/19). On his initial physical examination his temperature was 99.2, heart rate 96, respiratoryrate16, blood pressure 119/70, oxygenation 95% on room air. His lungs had rales at the left lung base, no wheezing, heart S1-S2 presentandrhythmic, abdomen soft, no lower extremity edema. SARS COVID-19 was positive. Chest x-ray had a new right upper lobe infiltrate.  Patient was admitted to the hospital working diagnosis of acute hypoxic respiratory failure due to SARS COVID-19 pneumonia  On admission with no clinical signs of viral pneumonia, no respiratory distress or significant oxygen desaturation.Patient wastreated for aspiration pneumonia with IV ceftriaxone.   Patient had progressive and worsening decline in cognitive function, his family was contacted and he was changed to comfort measures, to follow with hospice as outpatient.  While hospitalized patient continue to deteriorate, positive agitation and distress, imminent death. Hydromorphone drip was started.    Assessment & Plan:   Principal Problem:   COVID-19 Active Problems:   HTN (hypertension)   Anemia in CKD (chronic kidney disease)   Diabetes mellitus, type 2 (HCC)   End stage renal disease ([place])   HCAP (healthcare-associated pneumonia)   Pressure injury of skin   Goals of care, counseling/discussion   Palliative care by specialist   DNR (do  not resuscitate) discussion   Encounter for hospice care discussion   1.Acute hypoxic respiratory failure, right upper lobe, aspiration pneumonia, present on admission, in the setting of SARS COVID-19 infection.His clinical condition continue to deteriorate, today with agitation and distress, imminent death, will place full comfort measures with hydromorphone IV, continue as needed haldol and lorazepam. Supplemental 02 for comfort.  2.Acute metabolic encephalopathy in the setting of dementia. Continuw to have severe cognitive decline, with no oral intake, not verbal and not interactive. Very poor prognosis. Will continue with comfort care.   3.End-stage renal disease on hemodialysis.Due to poor prognosis, further renal replacement therapy has been discontinued.   4.Anemia chronic renal disease. stable   5.Hypertension.Off antihypertensive medications, poor prognosis.   6. Bilateral perineum bilateral stage 2 pressure ulcers. Present on admission  DVT prophylaxis: scd  Code Status: DNR Family Communication:  No family at the bedside  Disposition Plan/ discharge barriers: pending placement hospice   Body mass index is 19.24 kg/m. Malnutrition Type:      Malnutrition Characteristics:      Nutrition Interventions:     RN Pressure Injury Documentation: Pressure Injury 03/26/2019 Perineum Bilateral Stage II -  Partial thickness loss of dermis presenting as a shallow open ulcer with a red, pink wound bed without slough. Pink wound bed (Active)  03/24/2019 2230  Location: Perineum  Location Orientation: Bilateral  Staging: Stage II -  Partial thickness loss of dermis presenting as a shallow open ulcer with a red, pink wound bed without slough.  Wound Description (Comments): Pink wound bed  Present on Admission: Yes     Consultants:   Nephrology   Palliative care   Procedures:     Antimicrobials:       Subjective: Patient has been agitated,  clinically in distress, no vomiting.  Continue to be not interactive.   Objective: Vitals:   04/05/19 1459 04/05/19 2043 04/05/19 2359 04/06/19 0742  BP: 133/63 125/67 (!) 117/98 (!) 119/40  Pulse: (!) 106 (!) 113 (!) 114 (!) 108  Resp:  20  16  Temp:  97.8 F (36.6 C) 99.5 F (37.5 C) 98.7 F (37.1 C)  TempSrc:  Oral Oral Oral  SpO2:  100% 92%   Weight:      Height:       No intake or output data in the 24 hours ending 04/06/19 1151 Filed Weights   03/31/19 0045 04/01/19 1505 04/01/19 1815  Weight: 58 kg 57.9 kg 57.4 kg    Examination:   General: Not in pain or dyspnea, deconditioned and ill looking appearing.  Neurology: somnolent, grimacing to touch, not verbal.  E ENT: positive pallor, no icterus, oral mucosa dry.  Cardiovascular: No JVD. S1-S2 present, rhythmic, no gallops, rubs, or murmurs. No lower extremity edema. Pulmonary: positive breath sounds bilaterally. Gastrointestinal. Abdomen with no rebound or guarding Skin. No rashes Musculoskeletal: no joint deformities     Data Reviewed: I have personally reviewed following labs and imaging studies  CBC: Recent Labs  Lab 03/31/19 0528 04/01/19 0614  WBC 8.0 6.2  NEUTROABS 6.8  --   HGB 9.0* 8.7*  HCT 27.2* 26.7*  MCV 89.2 89.9  PLT 204 481   Basic Metabolic Panel: Recent Labs  Lab 03/31/19 0528 04/01/19 0614 04/02/19 0756  NA 139 141 142  K 4.3 3.9 4.2  CL 98 100 101  CO2 23 22 20*  GLUCOSE 138* 111* 96  BUN 48* 67* 65*  CREATININE 7.95* 10.96* 11.08*  CALCIUM 7.4* 7.2* 7.4*  PHOS  --  6.4*  --    GFR: Estimated Creatinine Clearance: 4.7 mL/min (A) (by C-G formula based on SCr of 11.08 mg/dL (H)). Liver Function Tests: Recent Labs  Lab 04/01/19 0614  ALBUMIN 2.2*   No results for input(s): LIPASE, AMYLASE in the last 168 hours. No results for input(s): AMMONIA in the last 168 hours. Coagulation Profile: No results for input(s): INR, PROTIME in the last 168 hours. Cardiac Enzymes: No  results for input(s): CKTOTAL, CKMB, CKMBINDEX, TROPONINI in the last 168 hours. BNP (last 3 results) No results for input(s): PROBNP in the last 8760 hours. HbA1C: No results for input(s): HGBA1C in the last 72 hours. CBG: Recent Labs  Lab 04/01/19 1227 04/01/19 1523 04/01/19 2043 04/02/19 0457 04/02/19 0745  GLUCAP 120* 106* 94 89 89   Lipid Profile: No results for input(s): CHOL, HDL, LDLCALC, TRIG, CHOLHDL, LDLDIRECT in the last 72 hours. Thyroid Function Tests: No results for input(s): TSH, T4TOTAL, FREET4, T3FREE, THYROIDAB in the last 72 hours. Anemia Panel: No results for input(s): VITAMINB12, FOLATE, FERRITIN, TIBC, IRON, RETICCTPCT in the last 72 hours.    Radiology Studies: I have reviewed all of the imaging during this hospital visit personally     Scheduled Meds: Continuous Infusions: . HYDROmorphone       LOS: 9 days         Gerome Apley, MD

## 2019-04-06 NOTE — Progress Notes (Signed)
Patient family member (sister) Carla Drape wanted to do FaceTime with the patient to get a look at his condition. A FaceTime call was provided for 4 minutes. The family would like to communicate with the patient using this technology on a daily basis. This information will be communicated to the staff to help facilitate this request.

## 2019-04-06 NOTE — Progress Notes (Signed)
Currently no hospice bed avaliable for pt. Pt started on Dilaudid IV infusion at 0.5mg  per hour. At shift change 70ml of the bag has been used leaving 17ml. Pt is much less restless than previous. All three sibilings were able to face time via the nurse this afternoon with the pt, pt was unresponsive but family was grateful.

## 2019-04-07 NOTE — Progress Notes (Signed)
PROGRESS NOTE  Lucas Calderon  DOB: 1941/05/26  PCP: Maryella Shivers, MD NTZ:[id number]  DOA: 03/23/2019  LOS: 10 days   Brief narrative: 78 year old male who presented with altered mental status,transfer fromnursing facility. 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 He had a recent hospitalization for COVID-19 infection (03/22/19 to 03/25/19). On his initial physical examination his temperature was 99.2, heart rate 96, respiratoryrate16, blood pressure 119/70, oxygenation 95% on room air. His lungs had rales at the left lung base, no wheezing, heart S1-S2 presentandrhythmic, abdomen soft, no lower extremity edema. SARS COVID-19 was positive. Chest x-ray had a new right upper lobe infiltrate.  Patient was admitted to the hospital working diagnosis of acute hypoxic respiratory failure due to SARS COVID-19 pneumonia  On admission with no clinical signs of viral pneumonia, no respiratory distress or significant oxygen desaturation.Patient wastreated for aspiration pneumonia with IV ceftriaxone.   Patient had progressive and worsening decline in cognitive function, his family was contacted and he was changed to comfort measures, to follow with hospice as outpatient.  While hospitalized patient continue to deteriorate, positive agitation and distress, imminent death. Hydromorphone drip was started.   Subjective: Patient was seen and examined this morning.  Elderly African-American male.  Lethargic.  Tries to open eyes on sternal rub.   Assessment/Plan:  1.Acute hypoxic respiratory failure, right upper lobe, aspiration pneumonia, present on admission, in the setting of SARS COVID-19 infection.His clinical condition continue to  deteriorate.  He was also agitated and was in distress.  Dilaudid drip was started.  Patient is in comfort care at this time.    2.Acute metabolic encephalopathy in the setting of dementia.Continues to have severe cognitive decline, with no oral intake, not verbal and not interactive. Very poor prognosis.Will continue with comfort care.   3.End-stage renal disease on hemodialysis.Due to poor prognosis, further renal replacement therapy has been discontinued.   4.Anemia chronic renal disease. stable  5.Hypertension.Off antihypertensive medications, poor prognosis.   6. Bilateral perineum bilateral stage 2 pressure ulcers. Present on admission Mobility: Bedbound DVT prophylaxis:  SCD Code Status:   Code Status: DNR  Family Communication:  Expected Discharge:  Pending placement to hospice  Consultants:    Procedures:    Antimicrobials: Anti-infectives (From admission, onward)   Start     Dose/Rate Route Frequency Ordered Stop   03/31/19 1200  vancomycin (VANCOCIN) IVPB 750 mg/150 ml premix  Status:  Discontinued     750 mg 150 mL/hr over 60 Minutes Intravenous Every M-W-F (Hemodialysis) 03/29/19 2342 03/30/19 1209   03/29/19 2345  vancomycin (VANCOCIN) IVPB 750 mg/150 ml premix     750 mg 150 mL/hr over 60 Minutes Intravenous  Once 03/29/19 2344 03/30/19 0135   03/29/19 1430  cefTRIAXone (ROCEPHIN) 1 g in sodium chloride 0.9 % 100 mL IVPB  Status:  Discontinued     1 g 200 mL/hr over 30 Minutes Intravenous Every 24 hours 03/29/19 1419 04/02/19 1455   03/29/19 1200  vancomycin (VANCOCIN) IVPB 750 mg/150 ml premix  Status:  Discontinued     750 mg 150 mL/hr over 60 Minutes Intravenous Every M-W-F (Hemodialysis) 03/24/2019 1610 03/29/19 1419   03/29/19 0800  aztreonam (AZACTAM) 500 mg in dextrose 5 % 50 mL IVPB  Status:  Discontinued     500 mg 100 mL/hr over 30 Minutes Intravenous Every 12 hours  04/18/2019 1915 03/29/19 1419   04/07/2019 1545  vancomycin (VANCOCIN)  1,500 mg in sodium chloride 0.9 % 500 mL IVPB     1,500 mg 250 mL/hr over 120 Minutes Intravenous  Once 04/14/2019 1534 03/24/2019 1913   04/03/2019 1530  aztreonam (AZACTAM) 2 g in sodium chloride 0.9 % 100 mL IVPB     2 g 200 mL/hr over 30 Minutes Intravenous  Once 04/07/2019 1529 04/12/2019 1649      Diet Order            Diet - low sodium heart healthy        DIET DYS 2 Room service appropriate? No; Fluid consistency: Thin  Diet effective now              Infusions:  . HYDROmorphone 0.5 mg/hr (04/06/19 1446)    Scheduled Meds:   PRN meds: acetaminophen **OR** acetaminophen, antiseptic oral rinse, glycopyrrolate **OR** glycopyrrolate **OR** glycopyrrolate, haloperidol **OR** haloperidol **OR** haloperidol lactate, hydrocortisone, LORazepam, ondansetron **OR** ondansetron (ZOFRAN) IV, polyvinyl alcohol   Objective: Vitals:   04/07/19 0003 04/07/19 0800  BP: (!) 93/54 (!) 92/43  Pulse: (!) 109 (!) 115  Resp:  16  Temp: 99.9 F (37.7 C) 98.3 F (36.8 C)  SpO2: 94% 93%    Intake/Output Summary (Last 24 hours) at 04/07/2019 1514 Last data filed at 04/07/2019 0400 Gross per 24 hour  Intake 13.23 ml  Output -  Net 13.23 ml   Filed Weights   03/31/19 0045 04/01/19 1505 04/01/19 1815  Weight: 58 kg 57.9 kg 57.4 kg   Weight change:  Body mass index is 19.24 kg/m.   Physical Exam: General exam: Lethargic Skin: No rashes, lesions or ulcers. HEENT: Atraumatic, normocephalic, supple neck, no obvious bleeding Lungs: Clear to auscultation bilaterally CVS: Regular rate and rhythm, no murmur GI/Abd soft, nontender, nondistended, bowel sounds CNS: Sedated on IV Dilaudid Psychiatry: Sedated Extremities: No pedal edema, no calf tenderness  Data Review: I have personally reviewed the laboratory data and studies available.  Recent Labs  Lab 04/01/19 0614  WBC 6.2  HGB 8.7*  HCT 26.7*  MCV 89.9  PLT 172   Recent Labs  Lab 04/01/19 0614 04/02/19 0756  NA 141 142  K  3.9 4.2  CL 100 101  CO2 22 20*  GLUCOSE 111* 96  BUN 67* 65*  CREATININE 10.96* 11.08*  CALCIUM 7.2* 7.4*  PHOS 6.4*  --     Terrilee Croak, MD  [facility] 04/07/2019

## 2019-04-20 NOTE — Progress Notes (Signed)
Began notifying family members approximately 1325 re: Patient's passing. Have attempted calls to all 4 members listed every 10 minutes without speaking to someone. Have left a voicemail with 1 member.

## 2019-04-20 NOTE — TOC Progression Note (Addendum)
LATE NOTE SUBMISSION    Transition of Care [facility]) - Progression Note    Patient Details  Name: Lucas Calderon MRN: [id number] Date of Birth: 1941/01/19  Transition of Care [facility]) CM/SW [place],  Phone Number: May 04, 2019, 1:22 PM  Clinical Narrative:  CSW placed call to Hospice in [place] to check on bed status. Hospice home requesting an updated COVID test to see if patient is still positive, will determine where he is placed in the hospice home. CSW to follow.    Expected Discharge Plan: [place] Barriers to Discharge: Hospice Bed not available  Expected Discharge Plan and Services Expected Discharge Plan: [place] In-house Referral: Clinical Social Work Discharge Planning Services: NA Post Acute Care Choice: Hospice Living arrangements for the past 2 months: [place] Expected Discharge Date: 04/03/19               DME Arranged: N/A DME Agency: NA       HH Arranged: NA HH Agency: NA         Social Determinants of Health (SDOH) Interventions    Readmission Risk Interventions No flowsheet data found.

## 2019-04-20 NOTE — TOC Progression Note (Signed)
LATE NOTE SUBMISSION     Transition of Care [facility]) - Progression Note    Patient Details  Name: Lucas Calderon MRN: [id number] Date of Birth: 1941/07/08  Transition of Care [facility]) CM/SW [place],  Phone Number: 04-09-2019, 1:25 PM  Clinical Narrative:   CSW alerted by Hospice in [place] that there are still no beds available on the COVID unit, and they are having staffing issues with nurses due to the nurses testing positive for COVID. Hospice home is unsure when they would be able to take the patient. Hospice liaison asked if he should be made a hospice patient in the hospital, and CSW asked Supervisor about that. Patients usually do not change over to hospice except at discharge. CSW updated Hospice liaison. CSW to follow.    Expected Discharge Plan: [place] Barriers to Discharge: Hospice Bed not available  Expected Discharge Plan and Services Expected Discharge Plan: [place] In-house Referral: Clinical Social Work Discharge Planning Services: NA Post Acute Care Choice: Hospice Living arrangements for the past 2 months: [place] Expected Discharge Date: 04/03/19               DME Arranged: N/A DME Agency: NA       HH Arranged: NA HH Agency: NA         Social Determinants of Health (SDOH) Interventions    Readmission Risk Interventions No flowsheet data found.

## 2019-04-20 NOTE — Progress Notes (Signed)
Patient transported to morgue. On arrival back, broke down iv's. Wasted 50 mls dilaudid with Steffanie Dunn, Therapist, sports in [place].

## 2019-04-20 NOTE — Progress Notes (Signed)
Physician notified at approxmately 1330 re: patient's passing.

## 2019-04-20 NOTE — Death Summary Note (Signed)
Death Summary  Byrant Valent NLG:[id number] DOB: 10-Sep-1940 DOA: 03/29/2019  PCP: Maryella Shivers, MD  Admit date: 03-29-2019 Date of Death: 04-09-2019 Time of Death: 13:14 Notification: Maryella Shivers, MD notified of death of 09-Apr-2019   History of present illness:  78 year old male who presented with altered mental status,transfer fromnursing facility. 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 He had a recent hospitalization for COVID-19 infection (03/22/19 to 03/25/19). On his initial physical examination his temperature was 99.2, heart rate 96, respiratoryrate16, blood pressure 119/70, oxygenation 95% on room air. His lungs had rales at the left lung base, no wheezing, heart S1-S2 presentandrhythmic, abdomen soft, no lower extremity edema. SARS COVID-19 was positive. Chest x-ray had a new right upper lobe infiltrate.  Patient was admitted to the hospital working diagnosis of acute hypoxic respiratory failure due to SARS COVID-19 pneumonia  On admission with no clinical signs of viral pneumonia, no respiratory distress or significant oxygen desaturation.Patient wastreated for aspiration pneumonia with IV ceftriaxone.   Patient had progressive and worsening decline in cognitive function, his family was contacted and he was changed to comfort measures, to follow with hospice as outpatient.  While hospitalized patient continue to deteriorate,positiveagitation and distress, imminent death. Hydromorphone drip was started. Patient was planned for discharge to hospice but could not happen because no bed was available. Today, patient passed away at 13:14.  Final Diagnoses:  1.   Respiratory failure with hypoxia secondary to  pneumonia secondary to COVID-19 infection.  The results of significant diagnostics from this hospitalization (including imaging, microbiology, ancillary and laboratory) are listed below for reference.    Significant Diagnostic Studies: Ct Head Wo Contrast  Result Date: Mar 29, 2019 CLINICAL DATA:  78 year old who is COVID-19 positive, presenting with acute mental status changes. Current history of end-stage renal disease on hemodialysis. EXAM: CT HEAD WITHOUT CONTRAST TECHNIQUE: Contiguous axial images were obtained from the base of the skull through the vertex without intravenous contrast. COMPARISON:  08/07/2016 and earlier. FINDINGS: Patient motion blurred many of the images. Brain: Moderate cortical and deep atrophy, unchanged. Mild changes of small vessel disease of the white matter diffusely, unchanged. No mass lesion. No midline shift. No acute hemorrhage or hematoma. No extra-axial fluid collections. No evidence of acute infarction. No interval change. Vascular: Severe BILATERAL carotid siphon atherosclerosis. No hyperdense vessel. Skull: Skull base suboptimally imaged due to patient motion. Allowing for this, no focal osseous abnormalities involving the skull. Sinuses/Orbits: Visualized paranasal sinuses well aerated. BILATERAL mastoid air cells and BILATERAL middle ear cavities also appear well aerated, though are blurred by patient motion. Orbits and globes not optimally imaged due to patient motion. For Other: None. IMPRESSION: 1. Motion degraded examination demonstrates no acute intracranial abnormality. 2. Stable moderate generalized atrophy and mild chronic microvascular ischemic changes of the white matter. Electronically Signed   By: Evangeline Dakin M.D.   On: 03/29/19 17:48   Dg Chest Portable 1 View  Result Date: 2019-03-29 CLINICAL DATA:  Acute onset of chest pain and shortness of breath. Patient is COVID-19 positive. EXAM: PORTABLE CHEST 1 VIEW COMPARISON:  03/22/2019 and earlier.  FINDINGS: Cardiac silhouette normal in size, unchanged. Thoracic aorta markedly atherosclerotic, unchanged. Interval development of ground-glass airspace consolidation in the RIGHT UPPER LOBE since the examination 6 days ago. No new lung parenchymal findings elsewhere in either lung. Normal pulmonary vascularity. No  visible pleural effusions. IMPRESSION: Acute RIGHT upper lobe pneumonia, likely viral. Electronically Signed   By: Evangeline Dakin M.D.   On: 04/04/2019 14:37   Dg Chest Portable 1 View  Result Date: 03/22/2019 CLINICAL DATA:  COVID-19, altered level consciousness, shortness of breath EXAM: PORTABLE CHEST 1 VIEW COMPARISON:  03/13/2019 FINDINGS: The heart size and mediastinal contours are within normal limits. Aortic atherosclerosis. Both lungs are clear. The visualized skeletal structures are unremarkable. IMPRESSION: No acute abnormality of the lungs in AP portable projection. Electronically Signed   By: Eddie Candle M.D.   On: 03/22/2019 16:02    Microbiology: Recent Results (from the past 240 hour(s))  SARS CORONAVIRUS 2 (TAT 6-24 HRS) Nasopharyngeal Nasopharyngeal Swab     Status: Abnormal   Collection Time: 04/03/19 12:24 PM   Specimen: Nasopharyngeal Swab  Result Value Ref Range Status   SARS Coronavirus 2 POSITIVE (A) NEGATIVE Final    Comment: (NOTE) SARS-CoV-2 target nucleic acids are DETECTED. The SARS-CoV-2 RNA is generally detectable in upper and lower respiratory specimens during the acute phase of infection. Positive results are indicative of active infection with SARS-CoV-2. Clinical  correlation with patient history and other diagnostic information is necessary to determine patient infection status. Positive results do  not rule out bacterial infection or co-infection with other viruses. The expected result is Negative. Fact Sheet for Patients: SugarRoll.be Fact Sheet for Healthcare Providers:  https://www.woods-mathews.com/ This test is not yet approved or cleared by the [place] FDA and  has been authorized for detection and/or diagnosis of SARS-CoV-2 by FDA under an Emergency Use Authorization (EUA). This EUA will remain  in effect (meaning this test can be used) for the duration of the COVID-19 declaration under Section 564(b)(1) of the Act, 21 U.S.C.  section 360bbb-3(b)(1), unless the authorization is terminated or revoked sooner. Performed at [facility], [place] [address] [place], [place] [address]      Labs: Basic Metabolic Panel: Recent Labs  Lab 04/02/19 0756  NA 142  K 4.2  CL 101  CO2 20*  GLUCOSE 96  BUN 65*  CREATININE 11.08*  CALCIUM 7.4*   Liver Function Tests: No results for input(s): AST, ALT, ALKPHOS, BILITOT, PROT, ALBUMIN in the last 168 hours. No results for input(s): LIPASE, AMYLASE in the last 168 hours. No results for input(s): AMMONIA in the last 168 hours. CBC: No results for input(s): WBC, NEUTROABS, HGB, HCT, MCV, PLT in the last 168 hours. Cardiac Enzymes: No results for input(s): CKTOTAL, CKMB, CKMBINDEX, TROPONINI in the last 168 hours. D-Dimer No results for input(s): DDIMER in the last 72 hours. BNP: Invalid input(s): POCBNP CBG: Recent Labs  Lab 04/01/19 1523 04/01/19 2043 04/02/19 0457 04/02/19 0745  GLUCAP 106* 94 89 89   Anemia work up No results for input(s): VITAMINB12, FOLATE, FERRITIN, TIBC, IRON, RETICCTPCT in the last 72 hours. Urinalysis    Component Value Date/Time   COLORURINE YELLOW 11/17/2013 1642   APPEARANCEUR CLEAR 11/17/2013 1642   LABSPEC 1.014 11/17/2013 1642   PHURINE 5.5 11/17/2013 1642   GLUCOSEU NEGATIVE 11/17/2013 1642   HGBUR TRACE (A) 11/17/2013 1642   BILIRUBINUR NEGATIVE 11/17/2013 1642   KETONESUR NEGATIVE 11/17/2013 1642   PROTEINUR 100 (A) 11/17/2013 1642   UROBILINOGEN 0.2 11/17/2013 1642   NITRITE NEGATIVE 11/17/2013 1642   LEUKOCYTESUR NEGATIVE  11/17/2013 1642   Sepsis Labs Invalid input(s): PROCALCITONIN,  WBC,  LACTICIDVEN     SIGNED:  Terrilee Croak, MD  [facility] 2019/04/12, 1:40 PM

## 2019-04-20 NOTE — Progress Notes (Signed)
13ml of IV dilaudid wasted in stericycle with Carmon M. As witness.

## 2019-04-20 NOTE — TOC Progression Note (Signed)
LATE NOTE SUBMISSION     Transition of Care [facility]) - Progression Note    Patient Details  Name: Lucas Calderon MRN: [id number] Date of Birth: 1940/08/23  Transition of Care [facility]) CM/SW [place], [place] Phone Number: 19-Apr-2019, 1:23 PM  Clinical Narrative:  CSW following for discharge plan. Patient remains COVID+, no beds available in the COVID unit at the hospice home. CSW to follow.     Expected Discharge Plan: [place] Barriers to Discharge: Hospice Bed not available  Expected Discharge Plan and Services Expected Discharge Plan: [place] In-house Referral: Clinical Social Work Discharge Planning Services: NA Post Acute Care Choice: Hospice Living arrangements for the past 2 months: [place] Expected Discharge Date: 04/03/19               DME Arranged: N/A DME Agency: NA       HH Arranged: NA HH Agency: NA         Social Determinants of Health (SDOH) Interventions    Readmission Risk Interventions No flowsheet data found.

## 2019-04-20 NOTE — TOC Progression Note (Signed)
Transition of Care [facility]) - Progression Note    Patient Details  Name: Lucas Calderon MRN: [id number] Date of Birth: Aug 07, 1940  Transition of Care [facility]) CM/SW [place], [place] Phone Number: 29-Apr-2019, 1:30 PM  Clinical Narrative:   CSW checked again with [place] to make sure there were no hospice beds available for patient. Still no beds available for patient, and unsure when one would be available.     Expected Discharge Plan: [place] Barriers to Discharge: Hospice Bed not available  Expected Discharge Plan and Services Expected Discharge Plan: [place] In-house Referral: Clinical Social Work Discharge Planning Services: NA Post Acute Care Choice: Hospice Living arrangements for the past 2 months: [place] Expected Discharge Date: 04/03/19               DME Arranged: N/A DME Agency: NA       HH Arranged: NA HH Agency: NA         Social Determinants of Health (SDOH) Interventions    Readmission Risk Interventions No flowsheet data found.

## 2019-04-20 NOTE — TOC Progression Note (Addendum)
LATE NOTE SUBMISSION     Transition of Care [facility]) - Progression Note    Patient Details  Name: Lucas Calderon MRN: [id number] Date of Birth: 01/31/1941  Transition of Care [facility]) CM/SW [place], [place] Phone Number: 2019-05-04, 1:27 PM  Clinical Narrative:   CSW following. No hospice bed available still today, and patient was started on dilaudid drip for comfort. MD unsure if patient is stable for transport at this point. CSW reached out to the patient's sister, Lucas Calderon, to update her that the patient would not be able to transfer to hospice house in [place], that he would remain at the hospital. CSW asked about any barriers with transportation, if family would choose to come and see him before he passes that [facility] could assist with transportation for the family to the hospital. Lucas Calderon indicated that she did not feel comfortable with the patient's COVID status, but that she would ask anyone else if they wanted to and would give CSW a call back if they chose to come and visit. Lucas Calderon said that the nurses have been doing the video calls for the family and they are so appreciative of that.     Expected Discharge Plan: [place] Barriers to Discharge: Hospice Bed not available  Expected Discharge Plan and Services Expected Discharge Plan: [place] In-house Referral: Clinical Social Work Discharge Planning Services: NA Post Acute Care Choice: Hospice Living arrangements for the past 2 months: [place] Expected Discharge Date: 04/03/19               DME Arranged: N/A DME Agency: NA       HH Arranged: NA HH Agency: NA         Social Determinants of Health (SDOH) Interventions    Readmission Risk Interventions No flowsheet data found.

## 2019-04-20 DEATH — deceased
